# Patient Record
Sex: Male | Born: 1935
Health system: Southern US, Community
[De-identification: ages and names within clinical notes are randomized; demographics above are authoritative.]

## PROBLEM LIST (undated history)

## (undated) DIAGNOSIS — R001 Bradycardia, unspecified: Secondary | ICD-10-CM

## (undated) DIAGNOSIS — N400 Enlarged prostate without lower urinary tract symptoms: Secondary | ICD-10-CM

## (undated) DIAGNOSIS — I471 Supraventricular tachycardia, unspecified: Secondary | ICD-10-CM

## (undated) DIAGNOSIS — T7840XA Allergy, unspecified, initial encounter: Secondary | ICD-10-CM

## (undated) DIAGNOSIS — I10 Essential (primary) hypertension: Secondary | ICD-10-CM

## (undated) DIAGNOSIS — H3530 Unspecified macular degeneration: Secondary | ICD-10-CM

## (undated) DIAGNOSIS — R55 Syncope and collapse: Secondary | ICD-10-CM

## (undated) DIAGNOSIS — M170 Bilateral primary osteoarthritis of knee: Secondary | ICD-10-CM

## (undated) DIAGNOSIS — E785 Hyperlipidemia, unspecified: Secondary | ICD-10-CM

## (undated) HISTORY — PX: OTHER SURGICAL HISTORY: SHX169

## (undated) HISTORY — DX: Hyperlipidemia, unspecified: E78.5

## (undated) HISTORY — DX: Unspecified macular degeneration: H35.30

## (undated) HISTORY — DX: Bilateral primary osteoarthritis of knee: M17.0

## (undated) HISTORY — DX: Allergy, unspecified, initial encounter: T78.40XA

## (undated) HISTORY — DX: Benign prostatic hyperplasia without lower urinary tract symptoms: N40.0

---

## 1992-05-18 HISTORY — PX: LUMBAR FUSION: SHX111

## 2004-07-16 ENCOUNTER — Encounter: Admission: RE | Admit: 2004-07-16 | Discharge: 2004-07-16 | Payer: Self-pay | Admitting: Orthopedic Surgery

## 2004-08-25 ENCOUNTER — Encounter: Admission: RE | Admit: 2004-08-25 | Discharge: 2004-08-25 | Payer: Self-pay | Admitting: *Deleted

## 2008-06-05 ENCOUNTER — Encounter: Admission: RE | Admit: 2008-06-05 | Discharge: 2008-06-05 | Payer: Self-pay

## 2008-06-18 DIAGNOSIS — N400 Enlarged prostate without lower urinary tract symptoms: Secondary | ICD-10-CM

## 2008-06-18 HISTORY — DX: Benign prostatic hyperplasia without lower urinary tract symptoms: N40.0

## 2008-08-27 ENCOUNTER — Encounter: Admission: RE | Admit: 2008-08-27 | Discharge: 2008-08-27 | Payer: Self-pay | Admitting: Sports Medicine

## 2008-09-14 ENCOUNTER — Emergency Department (HOSPITAL_BASED_OUTPATIENT_CLINIC_OR_DEPARTMENT_OTHER): Admission: EM | Admit: 2008-09-14 | Discharge: 2008-09-14 | Payer: Self-pay | Admitting: Emergency Medicine

## 2008-09-27 ENCOUNTER — Emergency Department (HOSPITAL_BASED_OUTPATIENT_CLINIC_OR_DEPARTMENT_OTHER): Admission: EM | Admit: 2008-09-27 | Discharge: 2008-09-27 | Payer: Self-pay | Admitting: Emergency Medicine

## 2009-05-18 HISTORY — PX: CATARACT EXTRACTION W/ INTRAOCULAR LENS  IMPLANT, BILATERAL: SHX1307

## 2009-06-14 ENCOUNTER — Encounter: Admission: RE | Admit: 2009-06-14 | Discharge: 2009-06-14 | Payer: Self-pay | Admitting: Internal Medicine

## 2010-08-27 LAB — BASIC METABOLIC PANEL
Calcium: 9.1 mg/dL (ref 8.4–10.5)
GFR calc Af Amer: >60 mL/min (ref >60)
Potassium: 4.1 mEq/L (ref 3.5–5.1)

## 2010-08-27 LAB — CBC
HCT: 46 % (ref 39.0–52.0)
Platelets: 210 10*3/uL (ref 150–400)
RDW: 12.8 % (ref 11.5–15.5)
WBC: 8.6 10*3/uL (ref 4.0–10.5)

## 2010-08-27 LAB — PROTIME-INR: Prothrombin Time: 13.2 seconds (ref 11.6–15.2)

## 2010-08-27 LAB — DIFFERENTIAL
Lymphocytes Relative: 10 % — ABNORMAL LOW (ref 12–46)
Monocytes Absolute: 0.4 10*3/uL (ref 0.1–1.0)

## 2010-08-27 LAB — APTT: aPTT: 28 seconds (ref 24–37)

## 2010-10-30 ENCOUNTER — Other Ambulatory Visit: Payer: Self-pay | Admitting: Orthopedic Surgery

## 2010-10-30 DIAGNOSIS — M25512 Pain in left shoulder: Secondary | ICD-10-CM

## 2010-11-02 ENCOUNTER — Other Ambulatory Visit: Payer: Self-pay

## 2010-11-07 ENCOUNTER — Ambulatory Visit
Admission: RE | Admit: 2010-11-07 | Discharge: 2010-11-07 | Disposition: A | Discharge status: Home / Self Care | Payer: Medicare Other | Source: Ambulatory Visit | Attending: Orthopedic Surgery | Admitting: Orthopedic Surgery

## 2010-11-07 DIAGNOSIS — M25512 Pain in left shoulder: Secondary | ICD-10-CM

## 2011-05-19 HISTORY — PX: COLONOSCOPY: SHX174

## 2011-05-21 DIAGNOSIS — M1990 Unspecified osteoarthritis, unspecified site: Secondary | ICD-10-CM | POA: Insufficient documentation

## 2011-05-21 DIAGNOSIS — N401 Enlarged prostate with lower urinary tract symptoms: Secondary | ICD-10-CM | POA: Insufficient documentation

## 2011-10-19 DIAGNOSIS — J449 Chronic obstructive pulmonary disease, unspecified: Secondary | ICD-10-CM | POA: Insufficient documentation

## 2011-10-20 ENCOUNTER — Encounter: Payer: Self-pay | Admitting: Internal Medicine

## 2011-11-05 ENCOUNTER — Encounter: Payer: Self-pay | Admitting: Internal Medicine

## 2011-11-05 ENCOUNTER — Ambulatory Visit (AMBULATORY_SURGERY_CENTER): Payer: Medicare Other | Admitting: *Deleted

## 2011-11-05 VITALS — Ht 71.0 in | Wt 158.0 lb

## 2011-11-05 DIAGNOSIS — Z1211 Encounter for screening for malignant neoplasm of colon: Secondary | ICD-10-CM

## 2011-11-05 MED ORDER — MOVIPREP 100 G PO SOLR: ORAL | Status: DC

## 2011-12-02 ENCOUNTER — Encounter: Payer: Self-pay | Admitting: Internal Medicine

## 2011-12-02 ENCOUNTER — Ambulatory Visit (AMBULATORY_SURGERY_CENTER): Payer: Medicare Other | Admitting: Internal Medicine

## 2011-12-02 VITALS — BP 159/94 | HR 61 | Temp 96.9°F | Resp 15 | Ht 71.0 in | Wt 158.0 lb

## 2011-12-02 DIAGNOSIS — K5730 Diverticulosis of large intestine without perforation or abscess without bleeding: Secondary | ICD-10-CM

## 2011-12-02 DIAGNOSIS — Z1211 Encounter for screening for malignant neoplasm of colon: Secondary | ICD-10-CM

## 2011-12-02 MED ORDER — SODIUM CHLORIDE 0.9 % IV SOLN: 500.0000 mL | INTRAVENOUS | Status: DC

## 2011-12-02 NOTE — Op Note (Signed)
[facility] [address] [place], [place]  [address]  COLONOSCOPY PROCEDURE REPORT  PATIENT:  Kevin Page, Kevin Page  MR#:  [id number] BIRTHDATE:  11-06-1930, 80 yrs. old  GENDER:  male ENDOSCOPIST:  Iva Boop, MD, [facility] REF. BY:  Kari Baars, M.D. PROCEDURE DATE:  12/02/2011 PROCEDURE:  Colonoscopy 62130 ASA CLASS:  Class III INDICATIONS:  Routine Risk Screening MEDICATIONS:   These medications were titrated to patient response per physician's verbal order, MAC sedation, administered by CRNA, propofol (Diprivan) 140 mg  DESCRIPTION OF PROCEDURE:   After the risks benefits and alternatives of the procedure were thoroughly explained, informed consent was obtained.  Digital rectal exam was performed and revealed no rectal masses and an enlarged prostate.  Smooth, enlarged prostate with tiny calcified nodule on right. The LB CF-[id number] endoscope was introduced through the anus and advanced to the cecum, which was identified by both the appendix and ileocecal valve, without limitations.  The quality of the prep was excellent, using MoviPrep.  The instrument was then slowly withdrawn as the colon was fully examined. <<PROCEDUREIMAGES>>  FINDINGS:  Severe diverticulosis was found in the sigmoid colon. This was otherwise a normal examination of the colon. Includes right colon retroflexion.   Retroflexed views in the rectum revealed no abnormalities.    The time to cecum = 3:00 minutes. The scope was then withdrawn in 9:49  minutes from the cecum and the procedure completed. COMPLICATIONS:  None ENDOSCOPIC IMPRESSION: 1) Severe diverticulosis in the sigmoid colon 2) Otherwise normal colonoscopy  REPEAT EXAM:  In for as needed.  Iva Boop, MD, [facility]  CC:  Kari Baars, MD and The Patient  n. eSIGNED:   Iva Boop at 12/02/2011 10:33 AM  Haynes Hoehn, [phone]

## 2011-12-02 NOTE — Progress Notes (Signed)
Propofol per k rogers crna. See scanned intra procedure report. ewm 

## 2011-12-02 NOTE — Patient Instructions (Addendum)
There were no polyps or cancer seen!  You do have diverticulosis, a common condition that usually does not cause problems. Please read the information about this.  Thank you for choosing me and [facility].  Iva Boop, MD, FACG  YOU HAD AN ENDOSCOPIC PROCEDURE TODAY AT THE [facility]: Refer to the procedure report that was given to you for any specific questions about what was found during the examination.  If the procedure report does not answer your questions, please call your gastroenterologist to clarify.  If you requested that your care partner not be given the details of your procedure findings, then the procedure report has been included in a sealed envelope for you to review at your convenience later.  YOU SHOULD EXPECT: Some feelings of bloating in the abdomen. Passage of more gas than usual.  Walking can help get rid of the air that was put into your GI tract during the procedure and reduce the bloating. If you had a lower endoscopy (such as a colonoscopy or flexible sigmoidoscopy) you may notice spotting of blood in your stool or on the toilet paper. If you underwent a bowel prep for your procedure, then you may not have a normal bowel movement for a few days.  DIET: Your first meal following the procedure should be a light meal and then it is ok to progress to your normal diet.  A half-sandwich or bowl of soup is an example of a good first meal.  Heavy or fried foods are harder to digest and may make you feel nauseous or bloated.  Likewise meals heavy in dairy and vegetables can cause extra gas to form and this can also increase the bloating.  Drink plenty of fluids but you should avoid alcoholic beverages for 24 hours.  ACTIVITY: Your care partner should take you home directly after the procedure.  You should plan to take it easy, moving slowly for the rest of the day.  You can resume normal activity the day after the procedure however you should NOT DRIVE  or use heavy machinery for 24 hours (because of the sedation medicines used during the test).    SYMPTOMS TO REPORT IMMEDIATELY: A gastroenterologist can be reached at any hour.  During normal business hours, 8:30 AM to 5:00 PM Monday through Friday, call [phone].  After hours and on weekends, please call the GI answering service at [phone] who will take a message and have the physician on call contact you.   Following lower endoscopy (colonoscopy or flexible sigmoidoscopy):  Excessive amounts of blood in the stool  Significant tenderness or worsening of abdominal pains  Swelling of the abdomen that is new, acute  Fever of 100F or higher  FOLLOW UP:.  Our staff will call the home number listed on your records the next business day following your procedure to check on you and address any questions or concerns that you may have at that time regarding the information given to you following your procedure. This is a courtesy call and so if there is no answer at the home number and we have not heard from you through the emergency physician on call, we will assume that you have returned to your regular daily activities without incident.  SIGNATURES/CONFIDENTIALITY: You and/or your care partner have signed paperwork which will be entered into your electronic medical record.  These signatures attest to the fact that that the information above on your After Visit Summary has been reviewed and is  understood.  Full responsibility of the confidentiality of this discharge information lies with you and/or your care-partner.   Ok to resume your normal medications Follow up colonoscopy as needed

## 2011-12-02 NOTE — Progress Notes (Signed)
Patient did not experience any of the following events: a burn prior to discharge; a fall within the facility; wrong site/side/patient/procedure/implant event; or a hospital transfer or hospital admission upon discharge from the facility. (G8907) Patient did not have preoperative order for IV antibiotic SSI prophylaxis. (G8918)  

## 2011-12-03 ENCOUNTER — Telehealth: Payer: Self-pay

## 2011-12-03 NOTE — Telephone Encounter (Signed)
  Follow up Call-  Call back number 12/02/2011  Post procedure Call Back phone  # [phone]  Permission to leave phone message Yes     Patient questions:  Do you have a fever, pain , or abdominal swelling? no Pain Score  0 *  Have you tolerated food without any problems? yes  Have you been able to return to your normal activities? yes  Do you have any questions about your discharge instructions: Diet   no Medications  no Follow up visit  no  Do you have questions or concerns about your Care? no  Actions: * If pain score is 4 or above: No action needed, pain <4.

## 2012-01-13 DIAGNOSIS — Z8673 Personal history of transient ischemic attack (TIA), and cerebral infarction without residual deficits: Secondary | ICD-10-CM | POA: Insufficient documentation

## 2013-01-11 ENCOUNTER — Observation Stay (HOSPITAL_COMMUNITY)
Admission: EM | Admit: 2013-01-11 | Discharge: 2013-01-12 | Disposition: A | Discharge status: Home / Self Care | Payer: Medicare Other | Attending: Internal Medicine | Admitting: Internal Medicine

## 2013-01-11 ENCOUNTER — Emergency Department (HOSPITAL_COMMUNITY): Payer: Medicare Other

## 2013-01-11 ENCOUNTER — Encounter (HOSPITAL_COMMUNITY): Payer: Self-pay

## 2013-01-11 DIAGNOSIS — E785 Hyperlipidemia, unspecified: Secondary | ICD-10-CM | POA: Insufficient documentation

## 2013-01-11 DIAGNOSIS — I10 Essential (primary) hypertension: Secondary | ICD-10-CM | POA: Insufficient documentation

## 2013-01-11 DIAGNOSIS — I080 Rheumatic disorders of both mitral and aortic valves: Secondary | ICD-10-CM | POA: Insufficient documentation

## 2013-01-11 DIAGNOSIS — Z9181 History of falling: Secondary | ICD-10-CM | POA: Insufficient documentation

## 2013-01-11 DIAGNOSIS — J4489 Other specified chronic obstructive pulmonary disease: Secondary | ICD-10-CM | POA: Insufficient documentation

## 2013-01-11 DIAGNOSIS — N400 Enlarged prostate without lower urinary tract symptoms: Secondary | ICD-10-CM | POA: Insufficient documentation

## 2013-01-11 DIAGNOSIS — I079 Rheumatic tricuspid valve disease, unspecified: Secondary | ICD-10-CM | POA: Insufficient documentation

## 2013-01-11 DIAGNOSIS — R404 Transient alteration of awareness: Secondary | ICD-10-CM | POA: Insufficient documentation

## 2013-01-11 DIAGNOSIS — J449 Chronic obstructive pulmonary disease, unspecified: Secondary | ICD-10-CM | POA: Insufficient documentation

## 2013-01-11 DIAGNOSIS — R42 Dizziness and giddiness: Secondary | ICD-10-CM | POA: Insufficient documentation

## 2013-01-11 DIAGNOSIS — R55 Syncope and collapse: Principal | ICD-10-CM | POA: Insufficient documentation

## 2013-01-11 LAB — CBC WITH DIFFERENTIAL/PLATELET
Basophils Absolute: 0 10*3/uL (ref 0.0–0.1)
Basophils Relative: 0 % (ref 0–1)
Eosinophils Relative: 1 % (ref 0–5)
Lymphocytes Relative: 7 % — ABNORMAL LOW (ref 12–46)
MCHC: 34.4 g/dL (ref 30.0–36.0)
Neutro Abs: 11.1 10*3/uL — ABNORMAL HIGH (ref 1.7–7.7)
Platelets: 214 10*3/uL (ref 150–400)
RDW: 13.5 % (ref 11.5–15.5)
WBC: 12.5 10*3/uL — ABNORMAL HIGH (ref 4.0–10.5)

## 2013-01-11 LAB — URINALYSIS, ROUTINE W REFLEX MICROSCOPIC
Bilirubin Urine: NEGATIVE
Nitrite: NEGATIVE
Protein, ur: NEGATIVE mg/dL
Specific Gravity, Urine: 1.03 (ref 1.005–1.030)
Urobilinogen, UA: 0.2 mg/dL (ref 0.0–1.0)

## 2013-01-11 LAB — POCT I-STAT, CHEM 8
Glucose, Bld: 123 mg/dL — ABNORMAL HIGH (ref 70–99)
HCT: 41 % (ref 39.0–52.0)
Hemoglobin: 13.9 g/dL (ref 13.0–17.0)
Potassium: 4.4 mEq/L (ref 3.5–5.1)
TCO2: 27 mmol/L (ref 0–100)

## 2013-01-11 LAB — COMPREHENSIVE METABOLIC PANEL
Alkaline Phosphatase: 49 U/L (ref 39–117)
BUN: 15 mg/dL (ref 6–23)
CO2: 27 mEq/L (ref 19–32)
Chloride: 101 mEq/L (ref 96–112)
GFR calc Af Amer: 79 mL/min — ABNORMAL LOW (ref >90)
GFR calc non Af Amer: 69 mL/min — ABNORMAL LOW (ref >90)
Glucose, Bld: 123 mg/dL — ABNORMAL HIGH (ref 70–99)
Potassium: 4.3 mEq/L (ref 3.5–5.1)
Total Bilirubin: 0.4 mg/dL (ref 0.3–1.2)

## 2013-01-11 LAB — TROPONIN I: Troponin I: <0.3 ng/mL (ref <0.30)

## 2013-01-11 NOTE — ED Provider Notes (Signed)
CSN: [id number]     Arrival date & time 01/11/13  2040 History   First MD Initiated Contact with Patient 01/11/13 2056     Chief Complaint  Patient presents with  . Syncope    (Consider location/radiation/quality/duration/timing/severity/associated sxs/prior Treatment) HPI 79 year old male with a history of hypertension, hyperlipidemia presents via EMS for syncope. Patient reports he was in his normal state of health when today he went to an event [place] the [place] and did a lot of walking. Reports he had approximately 1-1/2 beers and little to eat, and was walking and began to feel lightheaded and sat down in a chair.  Reports he lost consciousness briefly but was aware when he hit the ground.  Witnesses deny seizure-like activity or prolonged loss of consciousness. Reports that patient appeared diaphoretic and pale after this happened and patient reported he continued to feel lightheaded after he awoke on the floor. EMS was called and noted patient to be hypotensive to 91/59 on arrival. His glucose was within normal limits.  Patient denies any preceding or current chest pain or shortness of breath. Patient denies any prior history of syncope, and notes that he had one episode of lightheadedness in the past. Denies any cardiac history including no history of MI, arrhythmia, and reports he had a normal stress test 67yrs ago but no cardiac evaluation since.  Notes hyperlipidemia, htn, remote hx smoking.  Reports he was in his normal state of health and denies fevers, cough, dysuria, abdominal pain, nausea/vomiting, diarrhea and blood in his stool.    Past Medical History  Diagnosis Date  . Allergy     dust and mites  . Hyperlipidemia   . BPH (benign prostatic hyperplasia)    Past Surgical History  Procedure Laterality Date  . Cataract extraction w/ intraocular lens  implant, bilateral  2011  . Lumbar fusion  1994  . Dupytron contractions  1998 , 2006    both hands   No family history  on file. History  Substance Use Topics  . Smoking status: Former Games developer  . Smokeless tobacco: Never Used  . Alcohol Use: 6.0 oz/week    12 drink(s) per week    Review of Systems  Constitutional: Negative for fever and chills.  HENT: Positive for neck pain (right side). Negative for sore throat and neck stiffness.   Eyes: Negative for visual disturbance.  Respiratory: Negative for shortness of breath.   Cardiovascular: Negative for chest pain.  Gastrointestinal: Negative for nausea, vomiting, abdominal pain, diarrhea (had 3BM today which is increased, however normal BMs), constipation and blood in stool.  Genitourinary: Negative for dysuria and difficulty urinating.  Musculoskeletal: Negative for back pain.  Skin: Negative for rash.  Neurological: Positive for syncope and light-headedness. Negative for headaches.    Allergies  Review of patient's allergies indicates no known allergies.  Home Medications   Current Outpatient Rx  Name  Route  Sig  Dispense  Refill  . aspirin EC 81 MG tablet   Oral   Take 81 mg by mouth daily.         . Azelastine-Fluticasone 137-50 MCG/ACT SUSP   Nasal   Place 1 spray into the nose as needed (congestion).          [name] dutasteride (AVODART) 0.5 MG capsule   Oral   Take 0.5 mg by mouth daily.         [name] lisinopril (PRINIVIL,ZESTRIL) 10 MG tablet   Oral   Take 10 mg by mouth daily.         [name]  Multiple Vitamins-Minerals (MH MACULAR HEALTH PO)   Oral   Take 2 tablets by mouth daily.         . simvastatin (ZOCOR) 20 MG tablet   Oral   Take 1 tablet by mouth Daily.         . Tamsulosin HCl (FLOMAX) 0.4 MG CAPS   Oral   Take 1 tablet by mouth Daily.          BP 148/86  Pulse 76  Temp(Src) 98.4 F (36.9 C) (Oral)  Resp 18  SpO2 97% Physical Exam  Nursing note and vitals reviewed. Constitutional: He is oriented to person, place, and time. He appears well-developed and well-nourished. No distress.  HENT:  Head:  Normocephalic and atraumatic.  Mouth/Throat: Oropharynx is clear and moist. No oropharyngeal exudate.  Eyes: Conjunctivae and EOM are normal. Pupils are equal, round, and reactive to light.  Neck: Normal range of motion.  Cardiovascular: Normal rate, regular rhythm, normal heart sounds and intact distal pulses.  Exam reveals no gallop and no friction rub.   No murmur heard. Pulmonary/Chest: Effort normal and breath sounds normal. No respiratory distress. He has no wheezes. He has no rales.  Abdominal: Soft. He exhibits no distension. There is no tenderness. There is no guarding.  Musculoskeletal: He exhibits no edema.  Neurological: He is alert and oriented to person, place, and time. He has normal strength. No cranial nerve deficit or sensory deficit. He exhibits normal muscle tone. He displays a negative Romberg sign. Coordination normal. GCS eye subscore is 4. GCS verbal subscore is 5. GCS motor subscore is 6.  Skin: Skin is warm and dry. He is not diaphoretic.    ED Course  Procedures (including critical care time) Labs Review Labs Reviewed  GLUCOSE, CAPILLARY - Abnormal; Notable for the following:    Glucose-Capillary 108 (*)    All other components within normal limits  COMPREHENSIVE METABOLIC PANEL - Abnormal; Notable for the following:    Glucose, Bld 123 (*)    GFR calc non Af Amer 69 (*)    GFR calc Af Amer 79 (*)    All other components within normal limits  CBC WITH DIFFERENTIAL - Abnormal; Notable for the following:    WBC 12.5 (*)    RBC 4.12 (*)    HCT 38.4 (*)    Neutrophils Relative % 89 (*)    Neutro Abs 11.1 (*)    Lymphocytes Relative 7 (*)    All other components within normal limits  URINALYSIS, ROUTINE W REFLEX MICROSCOPIC - Abnormal; Notable for the following:    APPearance CLOUDY (*)    All other components within normal limits  POCT I-STAT, CHEM 8 - Abnormal; Notable for the following:    Glucose, Bld 123 (*)    All other components within normal limits   TROPONIN I   Imaging Review Dg Chest 2 View  01/11/2013   *RADIOLOGY REPORT*  Clinical Data: Syncope  CHEST - 2 VIEW  Comparison: 06/14/2009  Findings: The cardiac shadow is stable.  Persistent elevation left hemidiaphragm is seen.  The lungs remain clear.  No acute bony abnormality is noted.  IMPRESSION: No acute abnormalities seen.   Original Report Authenticated By: Alcide Clever, M.D.   Date: 01/12/2013  Rate: 62  Rhythm: normal sinus rhythm  QRS Axis: normal  Intervals: normal  ST/T Wave abnormalities: none  Conduction Disutrbances:none  Narrative Interpretation:   Old EKG Reviewed: none available    MDM   1. Syncope  79 year old male with a history of hypertension, hyperlipidemia presents via EMS for syncope.  Differential diagnosis includes seizure, cardiac arrhythmia, cardiac ischemia, PE, infection, electrolyte abnormality. Glucose within normal limits. EKG evaluated by me and shows no sign of prolonged QT, Brugada, delta waves or ischemia.  Patient without chest pain or shortness of breath and he indicate cardiac ischemia or PE. Initial troponin negative. CBC shows nonspecific leukocytosis of 12.5. No sign of heart failure or pneumonia on chest x-ray. Urinalysis shows no signs of infection. Electrolytes are within normal limits and do not explain patient's syncope.  Patient denies any black or bloody bowel movements, or history of nausea vomiting or diarrhea, and have low suspicion for hypovolemia or acute blood loss as cause of syncope, however patient does report drinking alcohol, eating little, and exerting self more than usual prior to episode.   Patient with preceding lightheadedness, and vasovagal syncope remains possibility, however given patient's age, initial hypotension recommend admission. Discussed patient with Dr. Clelia Croft PCP will, see the patient for potential admission for observation.     Rhae Lerner, MD 01/12/13 [phone]

## 2013-01-11 NOTE — ED Notes (Signed)
Patient was at a meeting sitting in chair and had a witnessed syncopal event. Patient slid out of chair to the floor and was out for "about a minute." patient pale and diaphoretic at EMS arrival. BP 91/59. Received NS 125 mL. Last BP 108/61. CBG 125. Color has improved. Skin warm and dry. Neuo intact. No neurological deficits or weakness. Patient admits to consuming 2 beers today. Only c/o some neck stiffness. Hx TIA 6 months ago.

## 2013-01-11 NOTE — ED Notes (Signed)
Patient reports that he "over exerted himself today" did a lot of walking today. States that he felt lightheaded prior to the witnessed syncopal event. Denies any CP, SOB, palpitations or N/V. Patient unsure if he hit his head or not. Only complains of some neck stiffness. Currently patient is AAOx4, resp e/u, skin w/d, NAD noted.

## 2013-01-11 NOTE — ED Notes (Signed)
[facility] [phone] Windy Kalata D

## 2013-01-11 NOTE — ED Notes (Signed)
Patient transported to X-ray 

## 2013-01-12 ENCOUNTER — Encounter (HOSPITAL_COMMUNITY): Payer: Self-pay | Admitting: Internal Medicine

## 2013-01-12 DIAGNOSIS — N400 Enlarged prostate without lower urinary tract symptoms: Secondary | ICD-10-CM | POA: Diagnosis present

## 2013-01-12 DIAGNOSIS — R55 Syncope and collapse: Secondary | ICD-10-CM | POA: Diagnosis present

## 2013-01-12 DIAGNOSIS — E785 Hyperlipidemia, unspecified: Secondary | ICD-10-CM | POA: Diagnosis present

## 2013-01-12 DIAGNOSIS — I10 Essential (primary) hypertension: Secondary | ICD-10-CM | POA: Diagnosis present

## 2013-01-12 LAB — CBC
MCH: 32.2 pg (ref 26.0–34.0)
MCHC: 34.7 g/dL (ref 30.0–36.0)
Platelets: 207 10*3/uL (ref 150–400)
RBC: 3.91 MIL/uL — ABNORMAL LOW (ref 4.22–5.81)
RDW: 13.5 % (ref 11.5–15.5)

## 2013-01-12 LAB — BASIC METABOLIC PANEL
CO2: 27 mEq/L (ref 19–32)
Calcium: 8.9 mg/dL (ref 8.4–10.5)
GFR calc Af Amer: 88 mL/min — ABNORMAL LOW (ref >90)
GFR calc non Af Amer: 76 mL/min — ABNORMAL LOW (ref >90)
Sodium: 135 mEq/L (ref 135–145)

## 2013-01-12 LAB — TROPONIN I
Troponin I: <0.3 ng/mL (ref <0.30)
Troponin I: <0.3 ng/mL (ref <0.30)

## 2013-01-12 MED ORDER — HYDROCODONE-ACETAMINOPHEN 5-325 MG PO TABS: 1.0000 | ORAL_TABLET | ORAL | Status: DC | PRN

## 2013-01-12 MED ORDER — ONDANSETRON HCL 4 MG PO TABS: 4.0000 mg | ORAL_TABLET | Freq: Four times a day (QID) | ORAL | Status: DC | PRN

## 2013-01-12 MED ORDER — ACETAMINOPHEN 650 MG RE SUPP: 650.0000 mg | Freq: Four times a day (QID) | RECTAL | Status: DC | PRN

## 2013-01-12 MED ORDER — SIMVASTATIN 20 MG PO TABS
20.0000 mg | ORAL_TABLET | Freq: Every day | ORAL | Status: DC
  Filled 2013-01-12: qty 1

## 2013-01-12 MED ORDER — FLUTICASONE PROPIONATE 50 MCG/ACT NA SUSP: 1.0000 | Freq: Two times a day (BID) | NASAL | Status: DC | PRN

## 2013-01-12 MED ORDER — TAMSULOSIN HCL 0.4 MG PO CAPS: 0.4000 mg | ORAL_CAPSULE | Freq: Every day | ORAL | Status: DC

## 2013-01-12 MED ORDER — AZELASTINE HCL 0.1 % NA SOLN: 1.0000 | Freq: Two times a day (BID) | NASAL | Status: DC | PRN

## 2013-01-12 MED ORDER — DUTASTERIDE 0.5 MG PO CAPS
0.5000 mg | ORAL_CAPSULE | Freq: Every day | ORAL | Status: DC
  Administered 2013-01-12: 0.5 mg via ORAL
  Filled 2013-01-12: qty 1

## 2013-01-12 MED ORDER — LISINOPRIL 10 MG PO TABS
10.0000 mg | ORAL_TABLET | Freq: Every day | ORAL | Status: DC
  Administered 2013-01-12: 10 mg via ORAL
  Filled 2013-01-12: qty 1

## 2013-01-12 MED ORDER — AZELASTINE-FLUTICASONE 137-50 MCG/ACT NA SUSP: 1.0000 | NASAL | Status: DC | PRN

## 2013-01-12 MED ORDER — SODIUM CHLORIDE 0.9 % IV SOLN
INTRAVENOUS | Status: DC
  Administered 2013-01-12: 02:00:00 via INTRAVENOUS

## 2013-01-12 MED ORDER — ALUM & MAG HYDROXIDE-SIMETH 200-200-20 MG/5ML PO SUSP: 30.0000 mL | Freq: Four times a day (QID) | ORAL | Status: DC | PRN

## 2013-01-12 MED ORDER — ACETAMINOPHEN 325 MG PO TABS: 650.0000 mg | ORAL_TABLET | Freq: Four times a day (QID) | ORAL | Status: DC | PRN

## 2013-01-12 MED ORDER — ASPIRIN EC 81 MG PO TBEC
81.0000 mg | DELAYED_RELEASE_TABLET | Freq: Every day | ORAL | Status: DC
  Administered 2013-01-12: 81 mg via ORAL
  Filled 2013-01-12: qty 1

## 2013-01-12 MED ORDER — SODIUM CHLORIDE 0.9 % IJ SOLN: 3.0000 mL | Freq: Two times a day (BID) | INTRAMUSCULAR | Status: DC

## 2013-01-12 MED ORDER — ONDANSETRON HCL 4 MG/2ML IJ SOLN: 4.0000 mg | Freq: Four times a day (QID) | INTRAMUSCULAR | Status: DC | PRN

## 2013-01-12 MED ORDER — ENOXAPARIN SODIUM 40 MG/0.4ML ~~LOC~~ SOLN
40.0000 mg | Freq: Every day | SUBCUTANEOUS | Status: DC
  Filled 2013-01-12: qty 0.4

## 2013-01-12 NOTE — Progress Notes (Signed)
Assessment unchanged. Discussed D/C instructions with pt and wife. No new changes in medications. IV and tele removed. Pt left via W/C accompanied by [employer].

## 2013-01-12 NOTE — Progress Notes (Signed)
Utilization Review Completed.   Vada Swift, RN, BSN Nurse Case Manager  [phone]  

## 2013-01-12 NOTE — Discharge Summary (Signed)
DISCHARGE SUMMARY  Kevin Page  MR#: [id number]  DOB:01/09/1931  Date of Admission: 01/11/2013 Date of Discharge: 01/12/2013  Attending Physician:SHAW,W DOUGLAS  Patient's NWG:NFAO,Kevin Page  Consults:  None  Discharge Diagnoses: Principal Problem:   Syncope Active Problems:   Hypertension   Hyperlipidemia   BPH (benign prostatic hyperplasia)  Past Medical History HTN Hyperlipidemia  BPH with elevated PSA- negative biopsy (2/10)  COPD- seen on CT 4/10  Allergic Rhinitis/chronic congestion  OA, knees-  Macular Degeneration- Dr. Hazle Quant  ?TIA/episode of confusion- 8/13- negative MRA, carotids  Past Surgical History  Procedure Laterality Date  . Cataract extraction w/ intraocular lens  implant, bilateral  2011  . Lumbar fusion  1994  . Dupytron contractions  1998 , 2006    both hands    Discharge Medications:   Medication List         aspirin EC 81 MG tablet  Take 81 mg by mouth daily.     Azelastine-Fluticasone 137-50 MCG/ACT Susp  Place 1 spray into the nose as needed (congestion).     dutasteride 0.5 MG capsule  Commonly known as:  AVODART  Take 0.5 mg by mouth daily.     lisinopril 10 MG tablet  Commonly known as:  PRINIVIL,ZESTRIL  Take 10 mg by mouth daily.     MH MACULAR HEALTH PO  Take 2 tablets by mouth daily.     simvastatin 20 MG tablet  Commonly known as:  ZOCOR  Take 1 tablet by mouth Daily.     tamsulosin 0.4 MG Caps capsule  Commonly known as:  FLOMAX  Take 1 tablet by mouth Daily.        Hospital Procedures: Dg Chest 2 View  01/11/2013   *RADIOLOGY REPORT*  Clinical Data: Syncope  CHEST - 2 VIEW  Comparison: 06/14/2009  Findings: The cardiac shadow is stable.  Persistent elevation left hemidiaphragm is seen.  The lungs remain clear.  No acute bony abnormality is noted.  IMPRESSION: No acute abnormalities seen.   Original Report Authenticated By: Alcide Clever, M.D.   Echocardiogram (8/28)- Study Conclusions - Left ventricle:  The cavity size was normal. Systolic function was normal. The estimated ejection fraction was in the range of 55% to 60%. Wall motion was normal; there were no regional wall motion abnormalities. Doppler parameters are consistent with abnormal left ventricular relaxation (grade 1 diastolic dysfunction). - Aortic valve: Mild regurgitation. - Aortic root: The aortic root was mildly dilated. - Mitral valve: Mild regurgitation. - Left atrium: The atrium was mildly to moderately dilated.   History of Present Illness: Kevin Page is a 79 year old white male with a history of hypertension, BPH, and possible TIA (8/13) who presented to the emergency department following a syncopal episode. Patient states that he had a long day today which began at the eye doctor for dilated eye exam. He had a milk shake for lunch. This evening he went to a gathering at the [employer] [place] where he walked for about one hour. He did not eat anything at this gathering but did drink about one beer. Near the end of the walk he became lightheaded and sat down. He says only had a brief loss of consciousness which he fell out of the chair. This was witnessed with no seizure activity. Also consciousness was less than 1 minute. EMS was activated and he was found have a blood pressure of 91/59, CBG 125. He was given normal saline 125 mL with a follow blood pressure of 108/61. In the  emergency department his EKG shows sinus arrhythmia with no acute ST changes. Cardiac enzymes are negative. Telemetry does show some occasional PACs and PVCs.   Currently, the patient is without complaint. He denies any recent chest pain or palpitations. No prior history of syncope. He did have a possible TIA exactly one year ago which was characterized by some mild confusion and unsteady gait. This may been alcohol related. He had an MRI/MRA and carotid Dopplers performed at that time that were normal. He has not had any recurrence of symptoms since  that time. His blood pressures at home have been consistently below 130/80 but no hypotension. He reports occasional lightheadedness when standing from a stooped over position.   Hospital Course: Kevin Page was observed overnight on telemetry. He had no arrhythmias noted other than sinus arrhythmia. Orthostatics were repeated and were negative. Cardiac enzymes remain negative. Echocardiogram was performed which was normal with no valvular or wall motion abnormalities.  He had carotid Dopplers done 1 year ago that showed no significant stenosis so these were not repeated. He had no further symptoms of lightheadedness, dizziness, or near syncope. His history and findings are most consistent with vasovagal syncope versus relative hypotension in the setting of poor by mouth intake and flomax/Lisinopril. I've encouraged him to push fluids, maintain good by mouth intake, and to call if he has any recurrent dizziness, lightheadedness, or near syncope. He is stable for discharge home with outpatient followup. We will consider an outpatient Holter monitor if he has any additional symptoms.  Day of Discharge Exam BP 152/94  Pulse 87  Temp(Src) 98.1 F (36.7 C) (Oral)  Resp 18  Ht 5\' 11"  (1.803 m)  Wt 71.4 kg (157 lb 6.5 oz)  BMI 21.96 kg/m2  SpO2 100%  Physical Exam: General appearance: alert and no distress Eyes: no scleral icterus Throat: oropharynx moist without erythema Resp: clear to auscultation bilaterally Cardio: regular rate and rhythm and with occasional ectopy (sinus rhythm with sinus arrythmia/PACs on telemetry) GI: soft, non-tender; bowel sounds normal; no masses,  no organomegaly Extremities: no clubbing, cyanosis or edema  Discharge Labs:  Recent Labs  01/11/13 2200 01/11/13 2219 01/12/13 0415  NA 137 138 135  K 4.3 4.4 3.6  CL 101 101 100  CO2 27  --  27  GLUCOSE 123* 123* 102*  BUN 15 14 14   CREATININE 1.01 1.10 0.98  CALCIUM 9.2  --  8.9    Recent Labs   01/11/13 2200  AST 24  ALT 16  ALKPHOS 49  BILITOT 0.4  PROT 6.1  ALBUMIN 3.7    Recent Labs  01/11/13 2200 01/11/13 2219 01/12/13 0415  WBC 12.5*  --  7.6  NEUTROABS 11.1*  --   --   HGB 13.2 13.9 12.6*  HCT 38.4* 41.0 36.3*  MCV 93.2  --  92.8  PLT 214  --  207   Lab Results  Component Value Date   INR 1.0 09/14/2008    Recent Labs  01/11/13 2150 01/12/13 0415  TROPONINI <0.30 <0.30    Discharge instructions:     Discharge Orders   Future Orders Complete By Expires   Diet - low sodium heart healthy  As directed    Discharge instructions  As directed    Comments:     Call if you have any lightheadedness, dizziness, or passing out spells.  Push fluids.  Monitor BP and call if running above 150/90 or below 100/50.   Increase activity slowly  As  directed       Disposition: to home with wife  Follow-up Appts: Follow-up with Dr. Clelia Croft at [facility] in 2 weeks (after upcoming trip to [place])  Condition on Discharge: stable  Tests Needing Follow-up:  None- consider Holter monitor if any further symptoms  Signed: SHAW,W DOUGLAS 01/12/2013, 10:23 AM

## 2013-01-12 NOTE — ED Provider Notes (Signed)
I saw and evaluated the patient, reviewed the resident's note and I agree with the findings and plan.   Given age and initially low BP feel obs admission for syncope is appropriate.   Audree Camel, MD 01/12/13 1201

## 2013-01-12 NOTE — H&P (Signed)
PCP:   Kari Baars, MD   Chief Complaint:  Passed out  HPI: Kevin Page is a 79 year old white male with a history of hypertension, BPH, and possible TIA (8/13) who presented to the emergency department following a syncopal episode. Patient states that he had a long day today which began at the eye doctor for dilated eye exam. He had a milk shake for lunch. This evening he went to a gathering at the [employer] building where he walked for about one hour. He did not eat anything at this gathering but did drink about one beer. Near the end of the walk he became lightheaded and sat down. He says only had a brief loss of consciousness which he fell out of the chair. This was witnessed with no seizure activity. Also consciousness was less than 1 minute. EMS was activated and he was found have a blood pressure of 91/59, CBG 125. He was given normal saline 125 mL with a follow blood pressure of 108/61. In the emergency department his EKG shows sinus arrhythmia with no acute ST changes. Cardiac enzymes are negative. Telemetry does show some occasional PACs and PVCs.  Currently, the patient is without complaint. He denies any recent chest pain or palpitations. No prior history of syncope. He did have a possible TIA exactly one year ago which was characterized by some mild confusion and unsteady gait. This may been alcohol related. He had an MRI/MRA and carotid Dopplers performed at that time that were normal. He has not had any recurrence of symptoms since that time. His blood pressures at home have been consistently below 130/80 but no hypotension. He reports occasional lightheadedness when standing from a stooped over position.  Review of Systems:  Review of Systems - All systems reviewed with patient and are negative except in history of present illness to Past Medical History: Past Medical History  Diagnosis Date  . Allergy     dust and mites  . Hyperlipidemia   . BPH (benign prostatic  hyperplasia)   HTN BPH with elevated PSA- negative biopsy (2/10) COPD- seen on CT 4/10 Allergic Rhinitis/chronic congestion OA, knees-  Macular Degeneration- Dr. Hazle Quant ?TIA/episode of confusion- 8/13- negative MRA, carotids  Past Surgical History  Procedure Laterality Date  . Cataract extraction w/ intraocular lens  implant, bilateral  2011  . Lumbar fusion  1994  . Dupytron contractions  1998 , 2006    both hands     Medications: Prior to Admission medications   Medication Sig Start Date End Date Taking? Authorizing Provider  aspirin EC 81 MG tablet Take 81 mg by mouth daily.   Yes Historical Provider, MD  Azelastine-Fluticasone 137-50 MCG/ACT SUSP Place 1 spray into the nose as needed (congestion).    Yes Historical Provider, MD  dutasteride (AVODART) 0.5 MG capsule Take 0.5 mg by mouth daily.   Yes Historical Provider, MD  lisinopril (PRINIVIL,ZESTRIL) 10 MG tablet Take 10 mg by mouth daily.   Yes Historical Provider, MD  Multiple Vitamins-Minerals (MH MACULAR HEALTH PO) Take 2 tablets by mouth daily.   Yes Historical Provider, MD  simvastatin (ZOCOR) 20 MG tablet Take 1 tablet by mouth Daily. 10/19/11  Yes Historical Provider, MD  Tamsulosin HCl (FLOMAX) 0.4 MG CAPS Take 1 tablet by mouth Daily. 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[employer],  Floria Raveling Quit 1994- 1ppd X 35 years 1-2 drinks per day  Family History: Father- dCVA (46s) Mother- d 67, Asthma Sister- AML d69- he donated stem cells Brother Drenda Freeze)- Throat Cancer, CVAs, Leukemia; deceased  1 natural son- healthy  Physical Exam: Filed Vitals:   01/11/13 2303 01/12/13 0028 01/12/13 0030 01/12/13 0031  BP: 136/69 158/76 171/76 148/86  Pulse: 66 78 90 76  Temp: 98.3 F (36.8 C) 98.4 F (36.9 C)    TempSrc: Oral Oral     Resp: 16 18 18 18   SpO2: 97% 98% 96% 97%   General appearance: alert and no distress Head: Normocephalic, without obvious abnormality, atraumatic Eyes: conjunctivae/corneas clear. PERRL, EOM's intact.  Nose: Nares normal. Septum midline. Mucosa normal. No drainage or sinus tenderness. Throat: lips, mucosa, and tongue normal; teeth and gums normal Neck: no adenopathy, no carotid bruit, no JVD and thyroid not enlarged, symmetric, no tenderness/mass/nodules Resp: clear to auscultation bilaterally Cardio: Distant heart sounds with occasional ectopy; otherwise regular rate and rhythm without murmurs rubs or gallop GI: soft, non-tender; bowel sounds normal; no masses,  no organomegaly Extremities: extremities normal, atraumatic, no cyanosis or edema Pulses: 2+ and symmetric Lymph nodes: Cervical adenopathy: no cervical lymphadenopathy Neurologic: Alert and oriented X 3, normal strength and tone. Normal symmetric reflexes. Finger-nose is intact  Labs on Admission:   Recent Labs  01/11/13 2200 01/11/13 2219  NA 137 138  K 4.3 4.4  CL 101 101  CO2 27  --   GLUCOSE 123* 123*  BUN 15 14  CREATININE 1.01 1.10  CALCIUM 9.2  --     Recent Labs  01/11/13 2200  AST 24  ALT 16  ALKPHOS 49  BILITOT 0.4  PROT 6.1  ALBUMIN 3.7     Recent Labs  01/11/13 2200 01/11/13 2219  WBC 12.5*  --   NEUTROABS 11.1*  --   HGB 13.2 13.9  HCT 38.4* 41.0  MCV 93.2  --   PLT 214  --     Recent Labs  01/11/13 2150  TROPONINI <0.30   Lab Results  Component Value Date   INR 1.0 09/14/2008    Radiological Exams on Admission: Dg Chest 2 View  01/11/2013   *RADIOLOGY REPORT*  Clinical Data: Syncope  CHEST - 2 VIEW  Comparison: 06/14/2009  Findings: The cardiac shadow is stable.  Persistent elevation left hemidiaphragm is seen.  The lungs remain clear.  No acute bony abnormality is noted.  IMPRESSION: No acute abnormalities seen.   Original Report Authenticated By: Alcide Clever, M.D.    Orders placed during the hospital encounter of 01/11/13  . ED EKG- Sinus rhythm with PACs. NSST changes    Assessment/Plan Principal Problem: 1. Syncope- he has a relatively low-risk history characterized by presyncopal lightheadedness. This was likely a orthostatic or vasovagal event in the setting of poor by mouth intake and relative hypotension. However, given his PACs on telemetry I think it would be prudent to monitor him overnight to rule out arrhythmia. Rule out myocardial infarction with serial cardiac enzymes. Will obtain Echocardiogram.  Consider outpatient Holter monitor.  Active Problems: 2. Hypertension- blood pressure was initially low but has now normalized.  We'll continue home blood pressure medications and monitor. 3. Hyperlipidemia- continue statin 4. BPH (benign prostatic hyperplasia)- Flomax may contribute to orthostasis. We'll monitor for recurrent symptoms. 5. Disposition-anticipate discharge to home within 24 hours after echocardiogram if no further events on telemetry and cardiac enzymes negative.   Kevin Page,W DOUGLAS 01/12/2013, 12:34 AM

## 2013-01-12 NOTE — Progress Notes (Signed)
Echocardiogram 2D Echocardiogram has been performed.  Kevin Page 01/12/2013, 9:22 AM

## 2013-01-12 NOTE — Care Management Utilization Note (Signed)
UR completed 

## 2013-01-30 DIAGNOSIS — R001 Bradycardia, unspecified: Secondary | ICD-10-CM | POA: Insufficient documentation

## 2013-02-10 ENCOUNTER — Encounter: Payer: Self-pay | Admitting: *Deleted

## 2013-02-13 ENCOUNTER — Encounter: Payer: Self-pay | Admitting: Cardiology

## 2013-02-13 ENCOUNTER — Encounter: Payer: Self-pay | Admitting: *Deleted

## 2013-02-14 ENCOUNTER — Encounter: Payer: Self-pay | Admitting: Cardiology

## 2013-02-14 ENCOUNTER — Ambulatory Visit (INDEPENDENT_AMBULATORY_CARE_PROVIDER_SITE_OTHER): Payer: Medicare Other | Admitting: Cardiology

## 2013-02-14 VITALS — BP 104/62 | HR 61 | Ht 71.0 in | Wt 155.7 lb

## 2013-02-14 DIAGNOSIS — R55 Syncope and collapse: Secondary | ICD-10-CM

## 2013-02-14 DIAGNOSIS — E785 Hyperlipidemia, unspecified: Secondary | ICD-10-CM

## 2013-02-14 LAB — TSH: TSH: 0.89 u[IU]/mL (ref 0.35–5.50)

## 2013-02-14 NOTE — Progress Notes (Signed)
Patient ID: Kevin Page, male   DOB: 1930-07-30, 79 y.o.   MRN: [id number]    Patient Name: Kevin Page Date of Encounter: 02/14/2013  Primary Care Provider:  Kari Baars, MD Primary Cardiologist:  Tobias Alexander, H   Patient Profile Syncope, bradycardia  Problem List   Past Medical History  Diagnosis Date  . Allergy     dust and mites  . Hyperlipidemia   . BPH (benign prostatic hyperplasia) 2/10  . Unspecified essential hypertension   . Osteoarthritis of both knees   . Macular degeneration    Past Surgical History  Procedure Laterality Date  . Cataract extraction w/ intraocular lens  implant, bilateral Bilateral 2011  . Lumbar fusion  1994  . Dupytron contractions  1998 , 2006    both hands   Allergies  No Known Allergies  HPI  78 year old male younger appearing with h/o hypertensiom, hyperlipidemia who had an episode of syncope in late August. He describes a day that was stressful, he was not drinking enough fluids. He denies any chest pain, palpitations, just felt lightheaded from to the syncopal episode. He states that he was never very athletic or actively engaged in sport. He only walks short distances and higher level of exertion makes him feel SOB.   Home Medications  Prior to Admission medications   Medication Sig Start Date End Date Taking? Authorizing Provider  aspirin EC 81 MG tablet Take 81 mg by mouth daily.    Historical Provider, MD  Azelastine-Fluticasone 137-50 MCG/ACT SUSP Place 1 spray into the nose as needed (congestion).     Historical Provider, MD  dutasteride (AVODART) 0.5 MG capsule Take 0.5 mg by mouth daily.    Historical Provider, MD  lisinopril (PRINIVIL,ZESTRIL) 10 MG tablet Take 10 mg by mouth daily.    Historical Provider, MD  Multiple Vitamins-Minerals (MH MACULAR HEALTH PO) Take 2 tablets by mouth daily.    Historical Provider, MD  simvastatin (ZOCOR) 20 MG tablet Take 1 tablet by mouth Daily. 10/19/11   Historical Provider, MD    Tamsulosin HCl (FLOMAX) 0.4 MG CAPS Take 1 tablet by mouth Daily. 09/22/11   Historical Provider, MD    Family History  Family History  Problem Relation Age of Onset  . Cancer Brother     tonsils    Social History  History   Social History  . Marital Status: Married    Spouse Name: N/A    Number of Children: N/A  . Years of Education: N/A   Occupational History  . Not on file.   Social History Main Topics  . Smoking status: Former Games developer  . Smokeless tobacco: Never Used  . Alcohol Use: 6.0 oz/week    12 drink(s) per week  . Drug Use: No  . Sexual Activity: Not on file   Other Topics Concern  . Not on file   Social History Narrative  . No narrative on file     Review of Systems General:  No chills, fever, night sweats or weight changes.  Cardiovascular:  No chest pain, dyspnea on exertion, edema, orthopnea, palpitations, paroxysmal nocturnal dyspnea. Dermatological: No rash, lesions/masses Respiratory: No cough, dyspnea Urologic: No hematuria, dysuria Abdominal:   No nausea, vomiting, diarrhea, bright red blood per rectum, melena, or hematemesis Neurologic:  No visual changes, wkns, changes in mental status. All other systems reviewed and are otherwise negative except as noted above.  Physical Exam  There were no vitals taken for this visit.  General: Pleasant, NAD  Psych: Normal affect. Neuro: Alert and oriented X 3. Moves all extremities spontaneously. HEENT: Normal  Neck: Supple without bruits or JVD. Lungs:  Resp regular and unlabored, CTA. Heart: RRR no s3, s4, or murmurs. Abdomen: Soft, non-tender, non-distended, BS + x 4.  Extremities: No clubbing, cyanosis or edema. DP/PT/Radials 2+ and equal bilaterally.  TTE 01/12/2013 Left ventricle: The cavity size was normal. Systolic function was normal. The estimated ejection fraction was in the range of 55% to 60%. Wall motion was normal; there were no regional wall motion abnormalities.  Doppler parameters are consistent with abnormal left ventricular relaxation (grade 1 diastolic dysfunction). - Aortic valve: Mild regurgitation. - Aortic root: The aortic root was mildly dilated. - Mitral valve: Mild regurgitation. - Left atrium: The atrium was mildly to moderately dilated.    Accessory Clinical Findings  ECG - SR, 61 BPM, no T wave abnormalities, sinus arrhythmia   Assessment & Plan  A very pleasant gentleman with an episode of syncope and concern for bradycardia  1. Syncope - appears to be due to dehydration, his baseline ECG shows SB/SR with significant sinus arrhythmia. He brings his own heart rate measurements with bradycardia down to 40'. We will order a 48 hour Holter monitor to rule out significant bradycardia/pauses. Order TSH.  2. Exertional SOB - we will order an exercise treadmill test to assess for chronotropic incompetence.  3. Hypertension - controlled  4. Hyperlipidemia - followed by his PCP, last checked in [id number] with LDL 111, TAG 81, HDL 51, simvastatin was increased to 20 mg QHS.  Follow up in 3 weeks  Tobias Alexander, Rexene Edison, MD 02/14/2013, 1:54 PM

## 2013-02-14 NOTE — Patient Instructions (Signed)
Your physician has recommended that you wear a holter monitor. Holter monitors are medical devices that record the heart's electrical activity. Doctors most often use these monitors to diagnose arrhythmias. Arrhythmias are problems with the speed or rhythm of the heartbeat. The monitor is a small, portable device. You can wear one while you do your normal daily activities. This is usually used to diagnose what is causing palpitations/syncope (passing out).  Your physician has requested that you have an exercise tolerance test. For further information please visit [email or web address] Please also follow instruction sheet, as given.  Your physician recommends that you schedule a follow-up appointment in: 3 weeks

## 2013-02-14 NOTE — Addendum Note (Signed)
Addended by: Demetrios Loll on: 02/14/2013 02:55 PM   Modules accepted: Orders

## 2013-03-02 ENCOUNTER — Encounter (INDEPENDENT_AMBULATORY_CARE_PROVIDER_SITE_OTHER): Payer: Medicare Other

## 2013-03-02 ENCOUNTER — Encounter: Payer: Self-pay | Admitting: Radiology

## 2013-03-02 DIAGNOSIS — R55 Syncope and collapse: Secondary | ICD-10-CM

## 2013-03-02 NOTE — Progress Notes (Signed)
Patient ID: Kevin Page, male   DOB: [birth year], 79 y.o.   MRN: [id number] E cardio 48 Holter monitor applied

## 2013-03-09 ENCOUNTER — Ambulatory Visit: Payer: Medicare Other | Admitting: Cardiology

## 2013-03-21 ENCOUNTER — Encounter (INDEPENDENT_AMBULATORY_CARE_PROVIDER_SITE_OTHER): Payer: Self-pay

## 2013-03-21 ENCOUNTER — Ambulatory Visit (INDEPENDENT_AMBULATORY_CARE_PROVIDER_SITE_OTHER): Payer: Medicare Other | Admitting: Physician Assistant

## 2013-03-21 DIAGNOSIS — R0602 Shortness of breath: Secondary | ICD-10-CM

## 2013-03-21 DIAGNOSIS — R55 Syncope and collapse: Secondary | ICD-10-CM

## 2013-03-21 NOTE — Progress Notes (Signed)
Exercise Treadmill Test  Pre-Exercise Testing Evaluation Rhythm: sinus bradycardia  Rate: 53     Test  Exercise Tolerance Test Ordering MD: Tobias Alexander, MD  Interpreting MD: Tereso Newcomer, PA-C  Unique Test No: 1   Treadmill:  1  Indication for ETT: Syncope, Shortness of breath  Contraindication to ETT: No   Stress Modality: exercise - treadmill  Cardiac Imaging Performed: non   Protocol: standard Bruce - maximal  Max BP:  181/88  Max MPHR (bpm):  140 85% MPR (bpm):  119  MPHR obtained (bpm):  123 % MPHR obtained:  88  Reached 85% MPHR (min:sec):  4:05 Total Exercise Time (min-sec):  4:17  Workload in METS:  6.1 Borg Scale: 17  Reason ETT Terminated:  dyspnea    ST Segment Analysis At Rest: normal ST segments - no evidence of significant ST depression With Exercise: non-specific ST changes  Other Information Arrhythmia:  No Angina during ETT:  absent (0) Quality of ETT:  diagnostic  ETT Interpretation:  normal - no evidence of ischemia by ST analysis  Comments: Fair exercise capacity. No chest pain.  He did complain of significant dyspnea with peak exercise. O2 sats on RA 93-98% Normal BP response to exercise. No significant ST changes at peak exercise to suggest ischemia.  Recommendations: F/u with Dr. Tobias Alexander as directed. Signed,  Tereso Newcomer, PA-C   03/21/2013 10:13 AM

## 2013-03-23 ENCOUNTER — Encounter: Payer: Self-pay | Admitting: Cardiology

## 2013-03-23 ENCOUNTER — Ambulatory Visit (INDEPENDENT_AMBULATORY_CARE_PROVIDER_SITE_OTHER): Payer: Medicare Other | Admitting: Cardiology

## 2013-03-23 VITALS — BP 127/70 | HR 76 | Ht 71.0 in | Wt 155.0 lb

## 2013-03-23 DIAGNOSIS — I10 Essential (primary) hypertension: Secondary | ICD-10-CM

## 2013-03-23 DIAGNOSIS — E785 Hyperlipidemia, unspecified: Secondary | ICD-10-CM

## 2013-03-23 DIAGNOSIS — R55 Syncope and collapse: Secondary | ICD-10-CM

## 2013-03-23 NOTE — Progress Notes (Signed)
Patient ID: Kevin Page, male   DOB: May 02, 1931, 79 y.o.   MRN: [id number]  Patient Name: Kevin Page Date of Encounter: 03/23/2013  Primary Care Provider:  Kari Baars, MD Primary Cardiologist:  Tobias Alexander, H   Patient Profile Syncope, bradycardia  Problem List   Past Medical History  Diagnosis Date  . Allergy     dust and mites  . Hyperlipidemia   . BPH (benign prostatic hyperplasia) 2/10  . Unspecified essential hypertension   . Osteoarthritis of both knees   . Macular degeneration    Past Surgical History  Procedure Laterality Date  . Cataract extraction w/ intraocular lens  implant, bilateral Bilateral 2011  . Lumbar fusion  1994  . Dupytron contractions  1998 , 2006    both hands   Allergies  No Known Allergies  HPI  80 year old male younger appearing with h/o hypertensiom, hyperlipidemia who had an episode of syncope in late August. He describes a day that was stressful, he was not drinking enough fluids. He denies any chest pain, palpitations, just felt lightheaded from to the syncopal episode. He states that he was never very athletic or actively engaged in sport. He only walks short distances and higher level of exertion makes him feel SOB.   Home Medications  Prior to Admission medications   Medication Sig Start Date End Date Taking? Authorizing Provider  aspirin EC 81 MG tablet Take 81 mg by mouth daily.    Historical Provider, MD  Azelastine-Fluticasone 137-50 MCG/ACT SUSP Place 1 spray into the nose as needed (congestion).     Historical Provider, MD  dutasteride (AVODART) 0.5 MG capsule Take 0.5 mg by mouth daily.    Historical Provider, MD  lisinopril (PRINIVIL,ZESTRIL) 10 MG tablet Take 10 mg by mouth daily.    Historical Provider, MD  Multiple Vitamins-Minerals (MH MACULAR HEALTH PO) Take 2 tablets by mouth daily.    Historical Provider, MD  simvastatin (ZOCOR) 20 MG tablet Take 1 tablet by mouth Daily. 10/19/11   Historical Provider, MD    Tamsulosin HCl (FLOMAX) 0.4 MG CAPS Take 1 tablet by mouth Daily. 09/22/11   Historical Provider, MD    Family History  Family History  Problem Relation Age of Onset  . Cancer Brother     tonsils    Social History  History   Social History  . Marital Status: Married    Spouse Name: N/A    Number of Children: N/A  . Years of Education: N/A   Occupational History  . Not on file.   Social History Main Topics  . Smoking status: Former Games developer  . Smokeless tobacco: Never Used  . Alcohol Use: 6.0 oz/week    12 drink(s) per week  . Drug Use: No  . Sexual Activity: Not on file   Other Topics Concern  . Not on file   Social History Narrative  . No narrative on file     Review of Systems General:  No chills, fever, night sweats or weight changes.  Cardiovascular:  No chest pain, dyspnea on exertion, edema, orthopnea, palpitations, paroxysmal nocturnal dyspnea. Dermatological: No rash, lesions/masses Respiratory: No cough, dyspnea Urologic: No hematuria, dysuria Abdominal:   No nausea, vomiting, diarrhea, bright red blood per rectum, melena, or hematemesis Neurologic:  No visual changes, wkns, changes in mental status. All other systems reviewed and are otherwise negative except as noted above.  Physical Exam  Blood pressure 127/70, pulse 76, height 5\' 11"  (1.803 m), weight 155  lb (70.308 kg).  General: Pleasant, NAD Psych: Normal affect. Neuro: Alert and oriented X 3. Moves all extremities spontaneously. HEENT: Normal  Neck: Supple without bruits or JVD. Lungs:  Resp regular and unlabored, CTA. Heart: RRR no s3, s4, or murmurs. Abdomen: Soft, non-tender, non-distended, BS + x 4.  Extremities: No clubbing, cyanosis or edema. DP/PT/Radials 2+ and equal bilaterally.  TTE 01/12/2013 Left ventricle: The cavity size was normal. Systolic function was normal. The estimated ejection fraction was in the range of 55% to 60%. Wall motion was normal; there were no regional  wall motion abnormalities. Doppler parameters are consistent with abnormal left ventricular relaxation (grade 1 diastolic dysfunction). - Aortic valve: Mild regurgitation. - Aortic root: The aortic root was mildly dilated. - Mitral valve: Mild regurgitation. - Left atrium: The atrium was mildly to moderately dilated.    Accessory Clinical Findings  ECG - SR, 61 BPM, no T wave abnormalities, sinus arrhythmia   Assessment & Plan  A very pleasant gentleman with an episode of syncope and concern for bradycardia  1. Syncope - appears to be due to dehydration, his baseline ECG shows SB/SR with significant sinus arrhythmia. He brings his own heart rate measurements with bradycardia down to 40'. TSH was normal. 48 hour Holter showed episodes of sinus bradycardia with longest pause 1.8 seconds. He also had few episodes of SVT, the longest run lasting 12 beats with rate 128 BPM.  This qualifies for a sick sinus syndrome however it doesn't qualify for a pacemaker placement yet. The patient is currently asymptomatic. We will follow him. He was explained to call us if he develops any symptoms of dizziness, or a syncope.  2. Exertional SOB - The patient has no ischemic changes on a treadmill stress test, no chronotropic incompetence. He showed only fair exercise capacity, he is encouraged to exercise more. He is currently not engaged in any physical activity. He was advised to walk at least 3 times per week for 30 minutes.  3. Hypertension - controlled  4. Hyperlipidemia - followed by his PCP, last checked in May 2014 with LDL 111, TAG 81, HDL 51, simvastatin was increased to 20 mg QHS.  Follow up in 1 year unless there is any chenge in his symptoms    Tobias Alexander, Rexene Edison, MD 03/23/2013, 10:51 AM

## 2013-03-23 NOTE — Patient Instructions (Signed)
**Note De-identified  Obfuscation** Your physician wants you to follow-up in: 1 year  You will receive a reminder letter in the mail two months in advance. If you don't receive a letter, please call our office to schedule the follow-up appointment.  Your physician recommends that you continue on your current medications as directed. Please refer to the Current Medication list given to you today.  

## 2013-11-01 DIAGNOSIS — Z720 Tobacco use: Secondary | ICD-10-CM | POA: Insufficient documentation

## 2013-11-01 DIAGNOSIS — R159 Full incontinence of feces: Secondary | ICD-10-CM | POA: Insufficient documentation

## 2013-11-01 DIAGNOSIS — F17201 Nicotine dependence, unspecified, in remission: Secondary | ICD-10-CM | POA: Insufficient documentation

## 2013-11-13 DIAGNOSIS — M810 Age-related osteoporosis without current pathological fracture: Secondary | ICD-10-CM | POA: Insufficient documentation

## 2014-08-28 ENCOUNTER — Other Ambulatory Visit (HOSPITAL_COMMUNITY): Payer: Self-pay | Admitting: Internal Medicine

## 2014-09-04 ENCOUNTER — Encounter (HOSPITAL_COMMUNITY)
Admission: RE | Admit: 2014-09-04 | Discharge: 2014-09-04 | Disposition: A | Discharge status: Home / Self Care | Payer: Medicare HMO | Source: Ambulatory Visit | Attending: Internal Medicine | Admitting: Internal Medicine

## 2014-09-04 DIAGNOSIS — M810 Age-related osteoporosis without current pathological fracture: Secondary | ICD-10-CM | POA: Diagnosis not present

## 2014-09-04 MED ORDER — DENOSUMAB 60 MG/ML ~~LOC~~ SOLN
60.0000 mg | Freq: Once | SUBCUTANEOUS | Status: AC
  Administered 2014-09-04: 60 mg via SUBCUTANEOUS
  Filled 2014-09-04: qty 1

## 2014-09-04 NOTE — Discharge Instructions (Signed)
PROLIA °Denosumab injection °What is this medicine? °DENOSUMAB (den oh sue mab) slows bone breakdown. Prolia is used to treat osteoporosis in women after menopause and in men. Xgeva is used to prevent bone fractures and other bone problems caused by cancer bone metastases. Xgeva is also used to treat giant cell tumor of the bone. °This medicine may be used for other purposes; ask your health care provider or pharmacist if you have questions. °COMMON BRAND NAME(S): Prolia, XGEVA °What should I tell my health care provider before I take this medicine? °They need to know if you have any of these conditions: °-dental disease °-eczema °-infection or history of infections °-kidney disease or on dialysis °-low blood calcium or vitamin D °-malabsorption syndrome °-scheduled to have surgery or tooth extraction °-taking medicine that contains denosumab °-thyroid or parathyroid disease °-an unusual reaction to denosumab, other medicines, foods, dyes, or preservatives °-pregnant or trying to get pregnant °-breast-feeding °How should I use this medicine? °This medicine is for injection under the skin. It is given by a health care professional in a hospital or clinic setting. °If you are getting Prolia, a special MedGuide will be given to you by the pharmacist with each prescription and refill. Be sure to read this information carefully each time. °For Prolia, talk to your pediatrician regarding the use of this medicine in children. Special care may be needed. For Xgeva, talk to your pediatrician regarding the use of this medicine in children. While this drug may be prescribed for children as young as 13 years for selected conditions, precautions do apply. °Overdosage: If you think you've taken too much of this medicine contact a poison control center or emergency room at once. °Overdosage: If you think you have taken too much of this medicine contact a poison control center or emergency room at once. °NOTE: This medicine is only  for you. Do not share this medicine with others. °What if I miss a dose? °It is important not to miss your dose. Call your doctor or health care professional if you are unable to keep an appointment. °What may interact with this medicine? °Do not take this medicine with any of the following medications: °-other medicines containing denosumab °This medicine may also interact with the following medications: °-medicines that suppress the immune system °-medicines that treat cancer °-steroid medicines like prednisone or cortisone °This list may not describe all possible interactions. Give your health care provider a list of all the medicines, herbs, non-prescription drugs, or dietary supplements you use. Also tell them if you smoke, drink alcohol, or use illegal drugs. Some items may interact with your medicine. °What should I watch for while using this medicine? °Visit your doctor or health care professional for regular checks on your progress. Your doctor or health care professional may order blood tests and other tests to see how you are doing. °Call your doctor or health care professional if you get a cold or other infection while receiving this medicine. Do not treat yourself. This medicine may decrease your body's ability to fight infection. °You should make sure you get enough calcium and vitamin D while you are taking this medicine, unless your doctor tells you not to. Discuss the foods you eat and the vitamins you take with your health care professional. °See your dentist regularly. Brush and floss your teeth as directed. Before you have any dental work done, tell your dentist you are receiving this medicine. °Do not become pregnant while taking this medicine or for 5 months after   stopping it. Women should inform their doctor if they wish to become pregnant or think they might be pregnant. There is a potential for serious side effects to an unborn child. Talk to your health care professional or pharmacist for  more information. °What side effects may I notice from receiving this medicine? °Side effects that you should report to your doctor or health care professional as soon as possible: °-allergic reactions like skin rash, itching or hives, swelling of the face, lips, or tongue °-breathing problems °-chest pain °-fast, irregular heartbeat °-feeling faint or lightheaded, falls °-fever, chills, or any other sign of infection °-muscle spasms, tightening, or twitches °-numbness or tingling °-skin blisters or bumps, or is dry, peels, or red °-slow healing or unexplained pain in the mouth or jaw °-unusual bleeding or bruising °Side effects that usually do not require medical attention (Report these to your doctor or health care professional if they continue or are bothersome.): °-muscle pain °-stomach upset, gas °This list may not describe all possible side effects. Call your doctor for medical advice about side effects. You may report side effects to FDA at 1-800-FDA-1088. °Where should I keep my medicine? °This medicine is only given in a clinic, doctor's office, or other health care setting and will not be stored at home. °NOTE: This sheet is a summary. It may not cover all possible information. If you have questions about this medicine, talk to your doctor, pharmacist, or health care provider. °© 2015, [employer]. (2011-11-02 12:37:47) °Osteoporosis °Throughout your life, your body breaks down old bone and replaces it with new bone. As you get older, your body does not replace bone as quickly as it breaks it down. By the age of 30 years, most people begin to gradually lose bone because of the imbalance between bone loss and replacement. Some people lose more bone than others. Bone loss beyond a specified normal degree is considered osteoporosis.  °Osteoporosis affects the strength and durability of your bones. The inside of the ends of your bones and your flat bones, like the bones of your pelvis, look like  honeycomb, filled with tiny open spaces. As bone loss occurs, your bones become less dense. This means that the open spaces inside your bones become bigger and the walls between these spaces become thinner. This makes your bones weaker. Bones of a person with osteoporosis can become so weak that they can break (fracture) during minor accidents, such as a simple fall. °CAUSES  °The following factors have been associated with the development of osteoporosis: °· Smoking. °· Drinking more than 2 alcoholic drinks several days per week. °· Long-term use of certain medicines: °¨ Corticosteroids. °¨ Chemotherapy medicines. °¨ Thyroid medicines. °¨ Antiepileptic medicines. °¨ Gonadal hormone suppression medicine. °¨ Immunosuppression medicine. °· Being underweight. °· Lack of physical activity. °· Lack of exposure to the sun. This can lead to vitamin D deficiency. °· Certain medical conditions: °¨ Certain inflammatory bowel diseases, such as Crohn disease and ulcerative colitis. °¨ Diabetes. °¨ Hyperthyroidism. °¨ Hyperparathyroidism. °RISK FACTORS °Anyone can develop osteoporosis. However, the following factors can increase your risk of developing osteoporosis: °· Gender--Women are at higher risk than men. °· Age--Being older than 50 years increases your risk. °· Ethnicity--White and Asian people have an increased risk. °· Weight --Being extremely underweight can increase your risk of osteoporosis. °· Family history of osteoporosis--Having a family member who has developed osteoporosis can increase your risk. °SYMPTOMS  °Usually, people with osteoporosis have no symptoms.  °DIAGNOSIS  °Signs during   a physical exam that may prompt your caregiver to suspect osteoporosis include: °· Decreased height. This is usually caused by the compression of the bones that form your spine (vertebrae) because they have weakened and become fractured. °· A curving or rounding of the upper back (kyphosis). °To confirm signs of osteoporosis,  your caregiver may request a procedure that uses 2 low-dose X-ray beams with different levels of energy to measure your bone mineral density (dual-energy X-ray absorptiometry [DXA]). Also, your caregiver may check your level of vitamin D. °TREATMENT  °The goal of osteoporosis treatment is to strengthen bones in order to decrease the risk of bone fractures. There are different types of medicines available to help achieve this goal. Some of these medicines work by slowing the processes of bone loss. Some medicines work by increasing bone density. Treatment also involves making sure that your levels of calcium and vitamin D are adequate. °PREVENTION  °There are things you can do to help prevent osteoporosis. Adequate intake of calcium and vitamin D can help you achieve optimal bone mineral density. Regular exercise can also help, especially resistance and weight-bearing activities. If you smoke, quitting smoking is an important part of osteoporosis prevention. °MAKE SURE YOU: °· Understand these instructions. °· Will watch your condition. °· Will get help right away if you are not doing well or get worse. °FOR MORE INFORMATION °www.osteo.org and www.nof.org °Document Released: 02/11/2005 Document Revised: 08/29/2012 Document Reviewed: 04/18/2011 °ExitCare® Patient Information ©2015 [employer]. This information is not intended to replace advice given to you by your health care provider. Make sure you discuss any questions you have with your health care provider. ° ° °

## 2014-11-05 DIAGNOSIS — R0982 Postnasal drip: Secondary | ICD-10-CM | POA: Insufficient documentation

## 2015-03-05 ENCOUNTER — Encounter (HOSPITAL_COMMUNITY)
Admission: RE | Admit: 2015-03-05 | Discharge: 2015-03-05 | Disposition: A | Discharge status: Home / Self Care | Payer: Medicare HMO | Source: Ambulatory Visit | Attending: Internal Medicine | Admitting: Internal Medicine

## 2015-03-05 DIAGNOSIS — M810 Age-related osteoporosis without current pathological fracture: Secondary | ICD-10-CM | POA: Insufficient documentation

## 2015-03-07 DIAGNOSIS — H1131 Conjunctival hemorrhage, right eye: Secondary | ICD-10-CM | POA: Diagnosis not present

## 2015-04-01 DIAGNOSIS — H353132 Nonexudative age-related macular degeneration, bilateral, intermediate dry stage: Secondary | ICD-10-CM | POA: Diagnosis not present

## 2015-04-01 DIAGNOSIS — H43813 Vitreous degeneration, bilateral: Secondary | ICD-10-CM | POA: Diagnosis not present

## 2015-04-01 DIAGNOSIS — Z961 Presence of intraocular lens: Secondary | ICD-10-CM | POA: Diagnosis not present

## 2015-04-01 DIAGNOSIS — H538 Other visual disturbances: Secondary | ICD-10-CM | POA: Diagnosis not present

## 2015-04-17 DIAGNOSIS — R55 Syncope and collapse: Secondary | ICD-10-CM | POA: Diagnosis not present

## 2015-04-17 DIAGNOSIS — I10 Essential (primary) hypertension: Secondary | ICD-10-CM | POA: Diagnosis not present

## 2015-04-17 DIAGNOSIS — Z6822 Body mass index (BMI) 22.0-22.9, adult: Secondary | ICD-10-CM | POA: Diagnosis not present

## 2015-04-17 DIAGNOSIS — R159 Full incontinence of feces: Secondary | ICD-10-CM | POA: Diagnosis not present

## 2015-04-17 DIAGNOSIS — R0982 Postnasal drip: Secondary | ICD-10-CM | POA: Diagnosis not present

## 2015-05-09 ENCOUNTER — Encounter (HOSPITAL_COMMUNITY): Payer: Medicare HMO

## 2015-07-18 DIAGNOSIS — H524 Presbyopia: Secondary | ICD-10-CM | POA: Diagnosis not present

## 2015-07-18 DIAGNOSIS — H47013 Ischemic optic neuropathy, bilateral: Secondary | ICD-10-CM | POA: Diagnosis not present

## 2015-07-18 DIAGNOSIS — H353132 Nonexudative age-related macular degeneration, bilateral, intermediate dry stage: Secondary | ICD-10-CM | POA: Diagnosis not present

## 2015-08-19 DIAGNOSIS — H53413 Scotoma involving central area, bilateral: Secondary | ICD-10-CM | POA: Diagnosis not present

## 2015-08-19 DIAGNOSIS — H353132 Nonexudative age-related macular degeneration, bilateral, intermediate dry stage: Secondary | ICD-10-CM | POA: Diagnosis not present

## 2015-10-07 DIAGNOSIS — R69 Illness, unspecified: Secondary | ICD-10-CM | POA: Diagnosis not present

## 2015-10-10 ENCOUNTER — Ambulatory Visit (INDEPENDENT_AMBULATORY_CARE_PROVIDER_SITE_OTHER): Payer: Medicare HMO | Admitting: Podiatry

## 2015-10-10 ENCOUNTER — Encounter: Payer: Self-pay | Admitting: Podiatry

## 2015-10-10 DIAGNOSIS — B351 Tinea unguium: Secondary | ICD-10-CM

## 2015-10-10 DIAGNOSIS — M2040 Other hammer toe(s) (acquired), unspecified foot: Secondary | ICD-10-CM

## 2015-10-10 NOTE — Progress Notes (Signed)
   Subjective:    Patient ID: Kevin Page, male    DOB: 1931-02-28, 82 y.o.   MRN: [id number]  HPI Chief Complaint  Patient presents with  . Nail Problem    Bilateral; great toes; nail discoloration & thickened nails; pt stated, "wants all nails checked for nail fungus"  . Debridement    Bilateral nail trim      Review of Systems  HENT: Positive for hearing loss and sinus pressure.   Eyes: Positive for visual disturbance.  Endocrine: Positive for polyuria.  Genitourinary: Positive for frequency.  All other systems reviewed and are negative.      Objective:   Physical Exam        Assessment & Plan:

## 2015-10-10 NOTE — Progress Notes (Signed)
Subjective:     Patient ID: Kevin Page, male   DOB: 14-Sep-1930, 82 y.o.   MRN: [id number]  HPI patient presents with a thickened nailbeds 1-5 both feet that he cannot cut with some discoloration of the hallux nails that he's concerned about   Review of Systems  All other systems reviewed and are negative.      Objective:   Physical Exam  Constitutional: He is oriented to person, place, and time.  Cardiovascular: Intact distal pulses.   Musculoskeletal: Normal range of motion.  Neurological: He is oriented to person, place, and time.  Skin: Skin is warm and dry.  Nursing note and vitals reviewed.  Neurovascular status intact muscle strength adequate range of motion within normal limits with patient found to have thickened nailbeds 1-5 both feet with discoloration the hallux nails that's localized in nature and appears to be more trauma area patient's found have good digital perfusion and is well oriented 3     Assessment:     Damaged nailbeds 1-5 both feet with mycotic component and discoloration    Plan:     H&P conditions reviewed and debridement of nailbeds 1-5 both feet accomplished with no iatrogenic bleeding noted. Reappoint for routine care or earlier if needed

## 2015-11-06 DIAGNOSIS — E784 Other hyperlipidemia: Secondary | ICD-10-CM | POA: Diagnosis not present

## 2015-11-06 DIAGNOSIS — I10 Essential (primary) hypertension: Secondary | ICD-10-CM | POA: Diagnosis not present

## 2015-11-13 DIAGNOSIS — Z87898 Personal history of other specified conditions: Secondary | ICD-10-CM | POA: Diagnosis not present

## 2015-11-13 DIAGNOSIS — R0982 Postnasal drip: Secondary | ICD-10-CM | POA: Diagnosis not present

## 2015-11-13 DIAGNOSIS — M810 Age-related osteoporosis without current pathological fracture: Secondary | ICD-10-CM | POA: Diagnosis not present

## 2015-11-13 DIAGNOSIS — I10 Essential (primary) hypertension: Secondary | ICD-10-CM | POA: Diagnosis not present

## 2015-11-13 DIAGNOSIS — R159 Full incontinence of feces: Secondary | ICD-10-CM | POA: Diagnosis not present

## 2015-11-13 DIAGNOSIS — J449 Chronic obstructive pulmonary disease, unspecified: Secondary | ICD-10-CM | POA: Diagnosis not present

## 2015-11-13 DIAGNOSIS — G3184 Mild cognitive impairment, so stated: Secondary | ICD-10-CM | POA: Diagnosis not present

## 2015-11-13 DIAGNOSIS — E784 Other hyperlipidemia: Secondary | ICD-10-CM | POA: Diagnosis not present

## 2015-11-13 DIAGNOSIS — L570 Actinic keratosis: Secondary | ICD-10-CM | POA: Diagnosis not present

## 2015-11-13 DIAGNOSIS — Z0000 Encounter for general adult medical examination without abnormal findings: Secondary | ICD-10-CM | POA: Diagnosis not present

## 2015-12-11 DIAGNOSIS — L570 Actinic keratosis: Secondary | ICD-10-CM | POA: Diagnosis not present

## 2015-12-11 DIAGNOSIS — L821 Other seborrheic keratosis: Secondary | ICD-10-CM | POA: Diagnosis not present

## 2015-12-11 DIAGNOSIS — D225 Melanocytic nevi of trunk: Secondary | ICD-10-CM | POA: Diagnosis not present

## 2015-12-11 DIAGNOSIS — D1801 Hemangioma of skin and subcutaneous tissue: Secondary | ICD-10-CM | POA: Diagnosis not present

## 2015-12-11 DIAGNOSIS — C44319 Basal cell carcinoma of skin of other parts of face: Secondary | ICD-10-CM | POA: Diagnosis not present

## 2015-12-31 DIAGNOSIS — H353133 Nonexudative age-related macular degeneration, bilateral, advanced atrophic without subfoveal involvement: Secondary | ICD-10-CM | POA: Diagnosis not present

## 2015-12-31 DIAGNOSIS — H1132 Conjunctival hemorrhage, left eye: Secondary | ICD-10-CM | POA: Diagnosis not present

## 2016-01-09 ENCOUNTER — Encounter (INDEPENDENT_AMBULATORY_CARE_PROVIDER_SITE_OTHER): Payer: Self-pay

## 2016-01-09 ENCOUNTER — Ambulatory Visit (INDEPENDENT_AMBULATORY_CARE_PROVIDER_SITE_OTHER): Payer: Medicare HMO | Admitting: Podiatry

## 2016-01-09 DIAGNOSIS — B351 Tinea unguium: Secondary | ICD-10-CM | POA: Diagnosis not present

## 2016-01-09 DIAGNOSIS — M79676 Pain in unspecified toe(s): Secondary | ICD-10-CM | POA: Diagnosis not present

## 2016-01-10 NOTE — Progress Notes (Signed)
Subjective:     Patient ID: Kevin Page, male   DOB: 04/11/1931, 82 y.o.   MRN: [id number]  HPI patient presents with thick yellow brittle nailbeds 1-5 both feet that are painful and he cannot take care of   Review of Systems     Objective:   Physical Exam Neurovascular status intact with thick yellow brittle nailbeds 1-5 both feet    Assessment:     Mycotic nail infections with pain 1-5 both feet    Plan:

## 2016-01-18 ENCOUNTER — Emergency Department (HOSPITAL_COMMUNITY): Payer: Medicare HMO

## 2016-01-18 ENCOUNTER — Encounter (HOSPITAL_COMMUNITY): Payer: Self-pay | Admitting: Family Medicine

## 2016-01-18 ENCOUNTER — Observation Stay (HOSPITAL_COMMUNITY)
Admission: EM | Admit: 2016-01-18 | Discharge: 2016-01-20 | Disposition: A | Discharge status: Home / Self Care | Payer: Medicare HMO | Attending: Internal Medicine | Admitting: Internal Medicine

## 2016-01-18 ENCOUNTER — Other Ambulatory Visit: Payer: Self-pay

## 2016-01-18 DIAGNOSIS — I10 Essential (primary) hypertension: Secondary | ICD-10-CM | POA: Diagnosis present

## 2016-01-18 DIAGNOSIS — Z87891 Personal history of nicotine dependence: Secondary | ICD-10-CM | POA: Diagnosis not present

## 2016-01-18 DIAGNOSIS — Z7982 Long term (current) use of aspirin: Secondary | ICD-10-CM | POA: Insufficient documentation

## 2016-01-18 DIAGNOSIS — Z79899 Other long term (current) drug therapy: Secondary | ICD-10-CM | POA: Diagnosis not present

## 2016-01-18 DIAGNOSIS — Z7983 Long term (current) use of bisphosphonates: Secondary | ICD-10-CM | POA: Diagnosis not present

## 2016-01-18 DIAGNOSIS — E785 Hyperlipidemia, unspecified: Secondary | ICD-10-CM | POA: Diagnosis not present

## 2016-01-18 DIAGNOSIS — R531 Weakness: Secondary | ICD-10-CM | POA: Diagnosis not present

## 2016-01-18 DIAGNOSIS — I77810 Thoracic aortic ectasia: Secondary | ICD-10-CM

## 2016-01-18 DIAGNOSIS — N400 Enlarged prostate without lower urinary tract symptoms: Secondary | ICD-10-CM | POA: Diagnosis not present

## 2016-01-18 DIAGNOSIS — R001 Bradycardia, unspecified: Secondary | ICD-10-CM | POA: Diagnosis not present

## 2016-01-18 DIAGNOSIS — I951 Orthostatic hypotension: Secondary | ICD-10-CM

## 2016-01-18 DIAGNOSIS — R55 Syncope and collapse: Principal | ICD-10-CM | POA: Diagnosis present

## 2016-01-18 DIAGNOSIS — I351 Nonrheumatic aortic (valve) insufficiency: Secondary | ICD-10-CM

## 2016-01-18 DIAGNOSIS — R404 Transient alteration of awareness: Secondary | ICD-10-CM | POA: Diagnosis not present

## 2016-01-18 DIAGNOSIS — H3530 Unspecified macular degeneration: Secondary | ICD-10-CM | POA: Insufficient documentation

## 2016-01-18 HISTORY — DX: Syncope and collapse: R55

## 2016-01-18 HISTORY — DX: Supraventricular tachycardia, unspecified: I47.10

## 2016-01-18 HISTORY — DX: Essential (primary) hypertension: I10

## 2016-01-18 HISTORY — DX: Supraventricular tachycardia: I47.1

## 2016-01-18 HISTORY — DX: Bradycardia, unspecified: R00.1

## 2016-01-18 LAB — CBC
HEMATOCRIT: 41.1 % (ref 39.0–52.0)
HEMOGLOBIN: 13.1 g/dL (ref 13.0–17.0)
MCH: 31 pg (ref 26.0–34.0)
MCHC: 31.9 g/dL (ref 30.0–36.0)
MCV: 97.2 fL (ref 78.0–100.0)
Platelets: 192 10*3/uL (ref 150–400)
RBC: 4.23 MIL/uL (ref 4.22–5.81)
RDW: 13.6 % (ref 11.5–15.5)
WBC: 7 10*3/uL (ref 4.0–10.5)

## 2016-01-18 LAB — BASIC METABOLIC PANEL
ANION GAP: 8 (ref 5–15)
BUN: 17 mg/dL (ref 6–20)
CALCIUM: 9 mg/dL (ref 8.9–10.3)
CO2: 27 mmol/L (ref 22–32)
Chloride: 100 mmol/L — ABNORMAL LOW (ref 101–111)
Creatinine, Ser: 1.2 mg/dL (ref 0.61–1.24)
GFR, EST NON AFRICAN AMERICAN: 54 mL/min — AB (ref >60)
GLUCOSE: 117 mg/dL — AB (ref 65–99)
POTASSIUM: 4.3 mmol/L (ref 3.5–5.1)
SODIUM: 135 mmol/L (ref 135–145)

## 2016-01-18 LAB — I-STAT TROPONIN, ED: TROPONIN I, POC: 0 ng/mL (ref 0.00–0.08)

## 2016-01-18 LAB — TROPONIN I

## 2016-01-18 MED ORDER — ASPIRIN EC 81 MG PO TBEC
81.0000 mg | DELAYED_RELEASE_TABLET | Freq: Every day | ORAL | Status: DC
  Administered 2016-01-19 – 2016-01-20 (×2): 81 mg via ORAL
  Filled 2016-01-18 (×2): qty 1

## 2016-01-18 MED ORDER — HYDROCODONE-ACETAMINOPHEN 5-325 MG PO TABS: 1.0000 | ORAL_TABLET | ORAL | Status: DC | PRN

## 2016-01-18 MED ORDER — BRIMONIDINE TARTRATE 0.2 % OP SOLN
1.0000 [drp] | Freq: Every day | OPHTHALMIC | Status: DC
  Administered 2016-01-19 (×2): 1 [drp] via OPHTHALMIC
  Filled 2016-01-18: qty 5

## 2016-01-18 MED ORDER — SIMVASTATIN 20 MG PO TABS
20.0000 mg | ORAL_TABLET | Freq: Every day | ORAL | Status: DC
  Administered 2016-01-19: 20 mg via ORAL
  Filled 2016-01-18: qty 1

## 2016-01-18 MED ORDER — SODIUM CHLORIDE 0.9 % IV BOLUS (SEPSIS)
500.0000 mL | Freq: Once | INTRAVENOUS | Status: AC
  Administered 2016-01-18: 1000 mL via INTRAVENOUS

## 2016-01-18 MED ORDER — ACETAMINOPHEN 325 MG PO TABS: 650.0000 mg | ORAL_TABLET | Freq: Four times a day (QID) | ORAL | Status: DC | PRN

## 2016-01-18 MED ORDER — SODIUM CHLORIDE 0.9 % IV SOLN
INTRAVENOUS | Status: AC
  Administered 2016-01-19: via INTRAVENOUS

## 2016-01-18 MED ORDER — DUTASTERIDE 0.5 MG PO CAPS
0.5000 mg | ORAL_CAPSULE | Freq: Every day | ORAL | Status: DC
  Administered 2016-01-19 – 2016-01-20 (×2): 0.5 mg via ORAL
  Filled 2016-01-18 (×2): qty 1

## 2016-01-18 MED ORDER — SALINE SPRAY 0.65 % NA SOLN
1.0000 | Freq: Every evening | NASAL | Status: DC | PRN
  Filled 2016-01-18 (×2): qty 44

## 2016-01-18 MED ORDER — HEPARIN SODIUM (PORCINE) 5000 UNIT/ML IJ SOLN
5000.0000 [IU] | Freq: Three times a day (TID) | INTRAMUSCULAR | Status: DC
  Administered 2016-01-19 – 2016-01-20 (×5): 5000 [IU] via SUBCUTANEOUS
  Filled 2016-01-18 (×5): qty 1

## 2016-01-18 MED ORDER — FLUTICASONE PROPIONATE 50 MCG/ACT NA SUSP
1.0000 | Freq: Every day | NASAL | Status: DC | PRN
Start: 2016-01-18 — End: 2016-01-20
  Filled 2016-01-18: qty 16

## 2016-01-18 MED ORDER — ACETAMINOPHEN 650 MG RE SUPP: 650.0000 mg | Freq: Four times a day (QID) | RECTAL | Status: DC | PRN

## 2016-01-18 MED ORDER — PROSIGHT PO TABS
1.0000 | ORAL_TABLET | Freq: Every day | ORAL | Status: DC
  Administered 2016-01-19 – 2016-01-20 (×2): 1 via ORAL
  Filled 2016-01-18 (×2): qty 1

## 2016-01-18 MED ORDER — SODIUM CHLORIDE 0.9% FLUSH
3.0000 mL | Freq: Two times a day (BID) | INTRAVENOUS | Status: DC
  Administered 2016-01-19 – 2016-01-20 (×4): 3 mL via INTRAVENOUS

## 2016-01-18 NOTE — H&P (Signed)
History and Physical    Kevin Page [id number] DOB: March 19, 1931 DOA: 01/18/2016  PCP: Marton Redwood, MD   Patient coming from: Home   Chief Complaint: Syncope   HPI: Kevin Page is a 82 y.o. male with medical history significant for hypertension, hyperlipidemia, BPH, and history of syncope in 2014 he presents to the emergency department for evaluation of a syncopal episode. Patient reports being in his usual state of health today when he went to church. While there, he began to feel weak and sat down. The next thing he recalls is EMS arriving to assess him. There was a preceding weakness, but no chest pain or palpitations, and no headache or focal numbness or weakness. Patient denies any lower extremity edema, recent surgery or long distance travel, cough, or hemoptysis. He reports having little to eat today prior to the episode, but his wife at the bedside states that he seemed to eat his normal amount. Patient had a very similar episode in 2014 and was evaluated with a largely unremarkable echocardiogram at that time. He was also evaluated with Holter monitor which demonstrated a 12-beat run of atrial fibrillation and some bradycardia with average rate in the 50s. There had been no recurrence of syncope or near syncope until today.   ED Course: Upon arrival to the ED, patient is found to be afebrile, saturating well on room air, bradycardic to the high 40s, and with normal blood pressure. EKG features a sinus bradycardia with rate 49 and is otherwise normal. Chest x-ray is negative for acute cardiopulmonary disease. Chemistry panel features a serum creatinine 1.20, up from 1.0 in 2014. CBC is unremarkable and troponin is undetectable. Orthostatic vital signs were obtained and there was an 18 mmHg drop in systolic pressures from laying to standing, along with a concomitant 20 2B per minute rise in heart rate. Patient was given a 500 cc normal saline bolus in the ED, remained hemodynamically stable,  and will be observed on the telemetry unit for ongoing evaluation and management of syncopal episode.   Review of Systems:  All other systems reviewed and apart from HPI, are negative.  Past Medical History:  Diagnosis Date  . Allergy    dust and mites  . BPH (benign prostatic hyperplasia) 2/10  . Hyperlipidemia   . Macular degeneration   . Osteoarthritis of both knees   . Unspecified essential hypertension     Past Surgical History:  Procedure Laterality Date  . CATARACT EXTRACTION W/ INTRAOCULAR LENS  IMPLANT, BILATERAL Bilateral 2011  . dupytron contractions  1998 , 2006   both hands  . LUMBAR FUSION  1994     reports that he has quit smoking. He has never used smokeless tobacco. He reports that he drinks about 6.0 oz of alcohol per week . He reports that he does not use drugs.  No Known Allergies  Family History  Problem Relation Age of Onset  . Cancer Brother     tonsils     Prior to Admission medications   Medication Sig Start Date End Date Taking? Authorizing Provider  alendronate (FOSAMAX) 70 MG tablet Take 70 mg by mouth every Thursday. Take with a full glass of water on an empty stomach.   Yes Historical Provider, MD  aspirin EC 81 MG tablet Take 81 mg by mouth daily.   Yes Historical Provider, MD  brimonidine (ALPHAGAN) 0.2 % ophthalmic solution Place 1 drop into both eyes at bedtime. 11/14/15  Yes Historical Provider, MD  dutasteride (AVODART) 0.5  MG capsule Take 0.5 mg by mouth daily.   Yes Historical Provider, MD  fluticasone (FLONASE) 50 MCG/ACT nasal spray Place 1 spray into both nostrils daily as needed for allergies or rhinitis.   Yes Historical Provider, MD  lisinopril (PRINIVIL,ZESTRIL) 10 MG tablet Take 10 mg by mouth daily.   Yes Historical Provider, MD  Multiple Vitamins-Minerals ([place] PO) Take 2 tablets by mouth daily.   Yes Historical Provider, MD  simvastatin (ZOCOR) 20 MG tablet Take 20 mg by mouth Daily.  10/19/11  Yes Historical  Provider, MD  sodium chloride (OCEAN) 0.65 % SOLN nasal spray Place 1 spray into both nostrils at bedtime as needed for congestion.   Yes Historical Provider, MD  Tamsulosin HCl (FLOMAX) 0.4 MG CAPS Take 0.4 mg by mouth Daily.  09/22/11  Yes Historical Provider, MD    Physical Exam: Vitals:   01/18/16 2015 01/18/16 2100 01/18/16 2130 01/18/16 2200  BP: 112/76 155/70 148/58 164/74  Pulse: (!) 57 (!) 56 63 62  Resp: 16 17 17 16   Temp:      TempSrc:      SpO2: 100% 96% 95% 95%  Weight:      Height:          Constitutional: NAD, calm, comfortable Eyes: PERTLA, lids and conjunctivae normal ENMT: Mucous membranes are moist. Posterior pharynx clear of any exudate or lesions.   Neck: normal, supple, no masses, no thyromegaly Respiratory: clear to auscultation bilaterally, no wheezing, no crackles. Normal respiratory effort.   Cardiovascular: S1 & S2 heard, regular rate and rhythm, soft systolic murmur at apex. No extremity edema. No carotid bruits. No significant JVD. Abdomen: No distension, no tenderness, no masses palpated. Bowel sounds normal.  Musculoskeletal: no clubbing / cyanosis. No joint deformity upper and lower extremities. Normal muscle tone.  Skin: no significant rashes, lesions, ulcers. Warm, dry, well-perfused. Neurologic: CN 2-12 grossly intact. Sensation intact, DTR normal. Strength 5/5 in all 4 limbs.  Psychiatric: Normal judgment and insight. Alert and oriented x 3. Normal mood and affect.     Labs on Admission: I have personally reviewed following labs and imaging studies  CBC:  Recent Labs Lab 01/18/16 1901  WBC 7.0  HGB 13.1  HCT 41.1  MCV 97.2  PLT [id number]   Basic Metabolic Panel:  Recent Labs Lab 01/18/16 1901  NA 135  K 4.3  CL 100*  CO2 27  GLUCOSE 117*  BUN 17  CREATININE 1.20  CALCIUM 9.0   GFR: Estimated Creatinine Clearance: 45 mL/min (by C-G formula based on SCr of 1.2 mg/dL). Liver Function Tests: No results for input(s): AST, ALT,  ALKPHOS, BILITOT, PROT, ALBUMIN in the last 168 hours. No results for input(s): LIPASE, AMYLASE in the last 168 hours. No results for input(s): AMMONIA in the last 168 hours. Coagulation Profile: No results for input(s): INR, PROTIME in the last 168 hours. Cardiac Enzymes: No results for input(s): CKTOTAL, CKMB, CKMBINDEX, TROPONINI in the last 168 hours. BNP (last 3 results) No results for input(s): PROBNP in the last 8760 hours. HbA1C: No results for input(s): HGBA1C in the last 72 hours. CBG: No results for input(s): GLUCAP in the last 168 hours. Lipid Profile: No results for input(s): CHOL, HDL, LDLCALC, TRIG, CHOLHDL, LDLDIRECT in the last 72 hours. Thyroid Function Tests: No results for input(s): TSH, T4TOTAL, FREET4, T3FREE, THYROIDAB in the last 72 hours. Anemia Panel: No results for input(s): VITAMINB12, FOLATE, FERRITIN, TIBC, IRON, RETICCTPCT in the last 72 hours. Urine analysis:  Component Value Date/Time   COLORURINE YELLOW 01/11/2013 2244   APPEARANCEUR CLOUDY (A) 01/11/2013 2244   LABSPEC 1.030 01/11/2013 2244   PHURINE 5.0 01/11/2013 2244   GLUCOSEU NEGATIVE 01/11/2013 2244   HGBUR NEGATIVE 01/11/2013 2244   BILIRUBINUR NEGATIVE 01/11/2013 2244   KETONESUR NEGATIVE 01/11/2013 2244   PROTEINUR NEGATIVE 01/11/2013 2244   UROBILINOGEN 0.2 01/11/2013 2244   NITRITE NEGATIVE 01/11/2013 2244   LEUKOCYTESUR NEGATIVE 01/11/2013 2244   Sepsis Labs: @LABRCNTIP (procalcitonin:4,lacticidven:4) )No results found for this or any previous visit (from the past 240 hour(s)).   Radiological Exams on Admission: Dg Chest 2 View  Result Date: 01/18/2016 CLINICAL DATA:  Syncope and bradycardia. EXAM: CHEST  2 VIEW COMPARISON:  01/11/2013 and prior chest radiographs FINDINGS: Mild cardiomegaly noted. Left hemidiaphragm elevation and left basilar scarring again noted. There is no evidence of focal airspace disease, pulmonary edema, suspicious pulmonary nodule/mass, pleural effusion,  or pneumothorax. No acute bony abnormalities are identified. IMPRESSION: No evidence of acute cardiopulmonary disease. Electronically Signed   By: Margarette Canada M.D.   On: 01/18/2016 19:35    EKG: Independently reviewed. Sinus bradycardia (rate 49)   Assessment/Plan  1. Syncope  - Most likely orthostatic; he reported decreased PO intake and there was 18 mmHg drop in SBP upon standing  - Cardiac etiology is also considered given his bradycardia  - Aside from bradycardia, EKG and telemetry monitoring without arrhythmia; troponin 0.00  - Will monitor on telemetry overnight, provide a gentle IVF hydration, check TTE for structural cardiac etiology   - Hold Flomax for now given concern for orthostasis  2. Sinus bradycardia  - Asymptomatic with HR in 40's  - Wore Holter monitor after syncopal episode in 2014 and had avg HR in 50's and one 12-beat run of atrial fibrillation  - Monitor on telemetry overnight for arrhythmia as possible etiology of syncopal episode   3. Hypertension  - At goal currently  - Managed at home with lisinopril; will hold this for now  - Monitor and treat prn for now   4. Hyperlipidemia  - Continue Zocor    5. BPH  - Managed with Avodart and Flomax  - Flomax will be held for now given concern for orthostatic hypotension     DVT prophylaxis: sq heparin Code Status: Full  Family Communication: Wife updated at bedside  Disposition Plan: Observe on telemetry Consults called: None Admission status: Observation    Vianne Bulls, MD [facility] Pager [phone]  If 7PM-7AM, please contact night-coverage [email or web address] Password [facility]  01/18/2016, 10:52 PM

## 2016-01-18 NOTE — ED Provider Notes (Signed)
Medical screening examination/treatment/procedure(s) were conducted as a shared visit with non-physician practitioner(s) and myself.  I personally evaluated the patient during the encounter.   EKG Interpretation  Date/Time:  Saturday January 18 2016 18:30:57 EDT Ventricular Rate:  49 PR Interval:  166 QRS Duration: 84 QT Interval:  454 QTC Calculation: 410 R Axis:   43 Text Interpretation:  Sinus bradycardia Otherwise normal ECG No significant change was found Confirmed by Wyvonnia Dusky  MD, Madelaine Whipple [phone]) on 01/18/2016 6:48:25 PM        HPI Comments: Kevin Page is a 82 y.o. male who presents to the Emergency Department complaining of an unwitnessed episode of syncope that occurred earlier this evening. 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 Pt is unable to specify how long he was unconscious. He denies CP, HA, abdominal pain, blurred vision, diaphoresis, nausea or SOB prior to syncope. He also denies fall or head injury. Per wife, he was transiently diaphoretic after he regained consciousness. Pt feels that he feels he is currently at his baseline and denies any current lightheadedness. He states he ate and drank normally today and felt otherwise well today. He is ambulatory without difficulty. No recent changes in medications. He reports prior episodes of similar syncope, for one of which he was admitted to the ED. Pt states some of these episodes were a result of forgetting to take his medication. No h/o cardiac disease. Pt does not take beta-blockers. He also denies recent melena, weakness, neck pain, abdominal pain, emesis, diarrhea, nausea, orthostatic dizziness.    Physical Examination: Heart: Regular rhythm; Bradycardic in the 50's, no murmurs; Radial pulses intact and equal  Lungs: Lungs CTA bilaterally.  Neuro: Cranial nerves 2-12 intact. 5/5 strength throughout, no ataxia on finger to nose. Romberg neg.  Normal gait. No nystagmus.    Orthostatic VS for the past 24 hrs:  BP- Lying Pulse- Lying BP- Sitting Pulse- Sitting BP- Standing at 0 minutes Pulse- Standing at 0 minutes  01/18/16 2005 137/71 56 132/82 60 119/80 78   Syncopal episode. Positive orthostatics. EKG without Brugada or prolonged QT. He does have sinus bradycardia. Plan observation given age and low heart rate.  By signing my name below, I, Hansel Feinstein, attest that this documentation has been prepared under the direction and in the presence of Ezequiel Essex, MD. Electronically Signed: Hansel Feinstein, Scribe. 01/18/16. 8:18 PM.   I personally performed the services described in this documentation, which was scribed in my presence. The recorded information has been reviewed and is accurate.    Ezequiel Essex, MD 01/19/16 [phone]

## 2016-01-18 NOTE — ED Provider Notes (Signed)
[facility] DEPT Provider Note   CSN: [id number] Arrival date & time: 01/18/16  [id number]     History   Chief Complaint Chief Complaint  Patient presents with  . Near Syncope    HPI Kevin Page is a 82 y.o. male who presents with syncope. PMH significant for HTN/HLD, macular degeneration, previous hx of syncope. 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 He started to feel lightheaded and went to sit down. He regained consciousness and was on the floor lying down. He is unsure of the amount of time he passed out and unsure if he hit his head. Currently he feels back to baseline and denies pain. Denies fever, chills, headache, acute vision changes (has macular degeneration), neck pain, chest pain, palpitations, SOB, abdominal pain, N/V, melena/hematochezia, unilateral weakness, paresthesias. Denies changes in medicines or decreased PO intake. He is not on blood thinners. Was admitted for syncope as obs in 2014 due to age even though it was thought to be vasovagal/orthostatic. EF 55-60% in 2014.  HPI  Past Medical History:  Diagnosis Date  . Allergy    dust and mites  . BPH (benign prostatic hyperplasia) 2/10  . Hyperlipidemia   . Macular degeneration   . Osteoarthritis of both knees   . Unspecified essential hypertension     Patient Active Problem List   Diagnosis Date Noted  . Syncope 01/12/2013  . Hypertension 01/12/2013  . Hyperlipidemia 01/12/2013  . BPH (benign prostatic hyperplasia) 01/12/2013    Past Surgical History:  Procedure Laterality Date  . CATARACT EXTRACTION W/ INTRAOCULAR LENS  IMPLANT, BILATERAL Bilateral 2011  . dupytron contractions  1998 , 2006   both hands  . LUMBAR FUSION  1994       Home Medications    Prior to Admission medications   Medication Sig Start Date End Date Taking? Authorizing Provider  alendronate (FOSAMAX) 70 MG tablet TAKE 1 TABLET BY MOUTH ON AN EMPTY STOMACH WITH GLASS OF WATER ONCE A WEEK 09/06/15    Historical Provider, MD  aspirin EC 81 MG tablet Take 81 mg by mouth daily.    Historical Provider, MD  Azelastine-Fluticasone 137-50 MCG/ACT SUSP Place 1 spray into the nose as needed (congestion).     Historical Provider, MD  DM-Doxylamine-Acetaminophen 15-6.25-325 MG CAPS Take 2 capsules by mouth as needed.    Historical Provider, MD  dutasteride (AVODART) 0.5 MG capsule Take 0.5 mg by mouth daily.    Historical Provider, MD  lisinopril (PRINIVIL,ZESTRIL) 10 MG tablet Take 10 mg by mouth daily.    Historical Provider, MD  Multiple Vitamins-Minerals ([place] PO) Take 2 tablets by mouth daily.    Historical Provider, MD  simvastatin (ZOCOR) 20 MG tablet Take 1 tablet by mouth Daily. 10/19/11   Historical Provider, MD  Tamsulosin HCl (FLOMAX) 0.4 MG CAPS Take 1 tablet by mouth Daily. 09/22/11   Historical Provider, MD  UNABLE TO [place], takes 2 tablets a day    Historical Provider, MD  UNABLE TO FIND Pt takes [place]; puts one drop in each eye every day    Historical Provider, MD    Family History Family History  Problem Relation Age of Onset  . Cancer Brother     tonsils    Social History Social History  Substance Use Topics  . Smoking status: Former Research scientist (life sciences)  . Smokeless tobacco: Never Used  . Alcohol use 6.0 oz/week    12 drink(s) per week  Allergies   Review of patient's allergies indicates no known allergies.   Review of Systems Review of Systems  Constitutional: Negative for appetite change, chills and fever.  HENT: Positive for congestion.        Chronic nasal congestion  Eyes: Negative for visual disturbance.  Respiratory: Negative for cough and shortness of breath.   Cardiovascular: Negative for chest pain and palpitations.  Gastrointestinal: Negative for abdominal pain, blood in stool, nausea and vomiting.  Genitourinary: Negative for dysuria and flank pain.  Musculoskeletal: Negative for back pain, gait problem and neck pain.    Neurological: Positive for syncope and light-headedness. Negative for dizziness, seizures, weakness, numbness and headaches.  All other systems reviewed and are negative.    Physical Exam Updated Vital Signs BP (!) 125/108 (BP Location: Right Arm)   Pulse (!) 49   Temp 98.1 F (36.7 C) (Oral)   Resp 14   Ht 5\' 11"  (1.803 m)   Wt 67.1 kg   SpO2 99%   BMI 20.64 kg/m   Physical Exam  Constitutional: He is oriented to person, place, and time. He appears well-developed and well-nourished. No distress.  Pleasant calm, cooperative elderly male in NAD  HENT:  Head: Normocephalic and atraumatic.  Eyes: Conjunctivae are normal. Pupils are equal, round, and reactive to light. Right eye exhibits no discharge. Left eye exhibits no discharge. No scleral icterus.  Neck: Normal range of motion. Neck supple.  No midline spinal tenderness  Cardiovascular: Regular rhythm.  Bradycardia present.  Exam reveals no gallop and no friction rub.   No murmur heard. Pulmonary/Chest: Effort normal and breath sounds normal. No respiratory distress. He has no wheezes. He has no rales. He exhibits no tenderness.  Abdominal: Soft. He exhibits no distension and no mass. There is no tenderness. There is no rebound and no guarding. No hernia.  Musculoskeletal: He exhibits no edema.  Neurological: He is alert and oriented to person, place, and time.  Mental Status:  Alert, oriented, thought content appropriate, able to give a coherent history. Speech fluent without evidence of aphasia. Able to follow 2 step commands without difficulty.  Cranial Nerves:  II:  Peripheral visual fields grossly normal, pupils equal, round, reactive to light III,IV, VI: ptosis not present, extra-ocular motions intact bilaterally  V,VII: smile symmetric, facial light touch sensation equal VIII: hearing grossly normal to voice  X: uvula elevates symmetrically  XI: bilateral shoulder shrug symmetric and strong XII: midline tongue  extension without fassiculations Motor:  Normal tone. 5/5 in upper and lower extremities bilaterally including strong and equal grip strength and dorsiflexion/plantar flexion Sensory: Pinprick and light touch normal in all extremities.  Deep Tendon Reflexes: 2+ and symmetric in the biceps and patella Cerebellar: normal finger-to-nose with bilateral upper extremities Gait: normal gait and balance CV: distal pulses palpable throughout    Skin: Skin is warm and dry.  Psychiatric: He has a normal mood and affect.  Nursing note and vitals reviewed.    ED Treatments / Results  Labs (all labs ordered are listed, but only abnormal results are displayed) Labs Reviewed  BASIC METABOLIC PANEL - Abnormal; Notable for the following:       Result Value   Chloride 100 (*)    Glucose, Bld 117 (*)    GFR calc non Af Amer 54 (*)    All other components within normal limits  CBC  TROPONIN I  TROPONIN I  TROPONIN I  BASIC METABOLIC PANEL  I-STAT TROPOININ, ED  EKG  EKG Interpretation  Date/Time:  Saturday January 18 2016 18:30:57 EDT Ventricular Rate:  49 PR Interval:  166 QRS Duration: 84 QT Interval:  454 QTC Calculation: 410 R Axis:   43 Text Interpretation:  Sinus bradycardia Otherwise normal ECG No significant change was found Confirmed by Wyvonnia Dusky  MD, STEPHEN [phone]) on 01/18/2016 6:48:25 PM       Radiology Dg Chest 2 View  Result Date: 01/18/2016 CLINICAL DATA:  Syncope and bradycardia. EXAM: CHEST  2 VIEW COMPARISON:  01/11/2013 and prior chest radiographs FINDINGS: Mild cardiomegaly noted. Left hemidiaphragm elevation and left basilar scarring again noted. There is no evidence of focal airspace disease, pulmonary edema, suspicious pulmonary nodule/mass, pleural effusion, or pneumothorax. No acute bony abnormalities are identified. IMPRESSION: No evidence of acute cardiopulmonary disease. Electronically Signed   By: Margarette Canada M.D.   On: 01/18/2016 19:35     Procedures Procedures (including critical care time)  Medications Ordered in ED Medications  brimonidine (ALPHAGAN) 0.2 % ophthalmic solution 1 drop (not administered)  fluticasone (FLONASE) 50 MCG/ACT nasal spray 1 spray (not administered)  sodium chloride (OCEAN) 0.65 % nasal spray 1 spray (not administered)  aspirin EC tablet 81 mg (not administered)  dutasteride (AVODART) capsule 0.5 mg (not administered)  multivitamin (PROSIGHT) tablet 1 tablet (not administered)  simvastatin (ZOCOR) tablet 20 mg (not administered)  sodium chloride flush (NS) 0.9 % injection 3 mL (not administered)  heparin injection 5,000 Units (not administered)  0.9 %  sodium chloride infusion (not administered)  acetaminophen (TYLENOL) tablet 650 mg (not administered)    Or  acetaminophen (TYLENOL) suppository 650 mg (not administered)  HYDROcodone-acetaminophen (NORCO/VICODIN) 5-325 MG per tablet 1-2 tablet (not administered)  sodium chloride 0.9 % bolus 500 mL (0 mLs Intravenous Stopped 01/18/16 2129)     Initial Impression / Assessment and Plan / ED Course  I have reviewed the triage vital signs and the nursing notes.  Pertinent labs & imaging results that were available during my care of the patient were reviewed by me and considered in my medical decision making (see chart for details).  Clinical Course   81 year old male presents with syncope. He does have positive orthostatics however last time he was admitted for syncope he had holter monitoring and several runs of SVT and was diagnosed with possible sick sinus syndrome but it was determined that he didn't qualify for a pacemaker. Vital signs in the ED reveal he was initially bradycardic. 500cc IVF bolus given.   CBC is unremarkable. CMP overall unremarkable. EKG shows sinus bradycardia. CXR is negative. Patient is denying all symptoms however due to his history will have him admitted for observation overnight. Spoke with Dr. Myna Hidalgo who will  admit.  Final Clinical Impressions(s) / ED Diagnoses   Final diagnoses:  Syncope, unspecified syncope type    New Prescriptions New Prescriptions   No medications on file     Recardo Evangelist, PA-C 01/18/16 [place], MD 01/19/16 [phone]

## 2016-01-18 NOTE — ED Notes (Signed)
Patient transported to X-ray 

## 2016-01-18 NOTE — ED Triage Notes (Signed)
Pt was at church today and started feeling weak and sat down. sts the next thing he remembers is EMS arriving. sts brady 40-50. Denies any pain. sts he feels tired. Denies chest pain, SOB. sts slight diaphoresis but now dry dry. CBG 83. 20 left ac. BP 127/75. Pulse 50 o2 100 %.

## 2016-01-19 ENCOUNTER — Encounter (HOSPITAL_COMMUNITY): Payer: Self-pay | Admitting: Physician Assistant

## 2016-01-19 DIAGNOSIS — I351 Nonrheumatic aortic (valve) insufficiency: Secondary | ICD-10-CM

## 2016-01-19 DIAGNOSIS — I77810 Thoracic aortic ectasia: Secondary | ICD-10-CM

## 2016-01-19 DIAGNOSIS — I10 Essential (primary) hypertension: Secondary | ICD-10-CM | POA: Diagnosis not present

## 2016-01-19 DIAGNOSIS — N400 Enlarged prostate without lower urinary tract symptoms: Secondary | ICD-10-CM

## 2016-01-19 DIAGNOSIS — I951 Orthostatic hypotension: Secondary | ICD-10-CM

## 2016-01-19 DIAGNOSIS — R55 Syncope and collapse: Secondary | ICD-10-CM | POA: Diagnosis not present

## 2016-01-19 DIAGNOSIS — E785 Hyperlipidemia, unspecified: Secondary | ICD-10-CM | POA: Diagnosis not present

## 2016-01-19 DIAGNOSIS — R001 Bradycardia, unspecified: Secondary | ICD-10-CM | POA: Diagnosis not present

## 2016-01-19 DIAGNOSIS — I471 Supraventricular tachycardia: Secondary | ICD-10-CM | POA: Insufficient documentation

## 2016-01-19 LAB — BASIC METABOLIC PANEL
ANION GAP: 4 — AB (ref 5–15)
BUN: 15 mg/dL (ref 6–20)
CALCIUM: 8.2 mg/dL — AB (ref 8.9–10.3)
CO2: 28 mmol/L (ref 22–32)
CREATININE: 1.01 mg/dL (ref 0.61–1.24)
Chloride: 103 mmol/L (ref 101–111)
GFR calc non Af Amer: >60 mL/min (ref >60)
Glucose, Bld: 87 mg/dL (ref 65–99)
Potassium: 3.9 mmol/L (ref 3.5–5.1)
SODIUM: 135 mmol/L (ref 135–145)

## 2016-01-19 LAB — TROPONIN I: Troponin I: <0.03 ng/mL (ref <0.03)

## 2016-01-19 LAB — GLUCOSE, CAPILLARY: GLUCOSE-CAPILLARY: 81 mg/dL (ref 65–99)

## 2016-01-19 LAB — TSH: TSH: 1.18 u[IU]/mL (ref 0.350–4.500)

## 2016-01-19 MED ORDER — LISINOPRIL 5 MG PO TABS
5.0000 mg | ORAL_TABLET | Freq: Every day | ORAL | Status: DC
  Administered 2016-01-19 – 2016-01-20 (×2): 5 mg via ORAL
  Filled 2016-01-19 (×2): qty 1

## 2016-01-19 NOTE — Progress Notes (Signed)
Pt refused bed alarm. Will continue to do hourly rounding.

## 2016-01-19 NOTE — Consult Note (Signed)
Cardiology Consultation Note    Patient ID: Jahmiere Hearn, MRN: [id number], DOB/AGE: Apr 10, 1931 82 y.o. Admit date: 01/18/2016   Date of Consult: 01/19/2016 Primary Physician: Marton Redwood, MD Primary Cardiologist: Meda Coffee  Chief Complaint: passed out Reason for Consultation: syncope, bradycardia Requesting MD: Dr. Myna Hidalgo  HPI:  Mr. Dudziak is an 82 y/o M with history of HTN, HLD, BPH, OA, macular degeneration, habitual alcohol intake (1 drink of gin daily), former tobacco abuse (44 yrs, quit 1990s), syncope 2014 (event monitor with SB 40s-50s, brief SVT), mild AI and mildly dilated aortic root in 2014 whom we are asked to see for syncope. This is his second episode since 2014.  He had an episode of syncope in 12/2012 following a stressful day where he hadn't had a lot of fluids. 2D echo 01/12/13: EF 55-60%, grade 1 DD, mild AI, mildly dilated aortic root, mild MR, mild-mod dilated. 48 hour Holter showed episodes of sinus bradycardia with longest pause 1.8 seconds, also with a few episodes of SVT, the longest run lasting 12 beats with rate 128 BPM -> he did have recordings of HR in the 40s at that time. This was felt possibly indicative of SSS, although there was no indication for PPM at that time. ETT 03/2013: fair exercise capacity, no ischemic changes, normal BP response, demonstrated chronotropic competence.  He was in his [employer] yesterday except had a "tired" day. He had eaten some shrimp and had some Coke for lunch - he thinks maybe he should've had more. 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 He excused himself to another room where he sat down. That's the last thing he remembers. He awoke to EMS, his wife, and the priest surrounding him. He denies any B/B incontinence. He was alone for part of the syncope so it's not clear how long he was fully out for. He has amnesia from the time of sitting down until EMS arrived. Denies any CP, SOB, palpitations,  vomiting. Has macular degeneration so sight is his major issue, but no acute changes. Not very active at home - no change in chronic dyspnea. No recent CP otherwise. Denies any sx now. Denies h/o dizziness upon standing. Workup thus far showed an 28mmHg drop in BP in ED with 20 beat rise in HR, suggestive of orthostasis. He has been treated with IVF. Labs notable for neg trop x 3, normal CBC, glucose 117, BUN/Cr 17/1.20. 2D echo pending.  Telemetry thus far has shown HR generally in the upper 40s, low 50s, with one episode of HR 44 this AM. He has not had any cardiac symptoms since admission.  Past Medical History:  Diagnosis Date  . Allergy    dust and mites  . BPH (benign prostatic hyperplasia)   . Essential hypertension   . Hyperlipidemia   . Macular degeneration   . Osteoarthritis of both knees   . Sinus bradycardia   . SVT (supraventricular tachycardia) ([place])    a. Event monitor 2014: few episodes of SVT, longest 12 beats with rate 128.  [name] Syncope       Surgical History:  Past Surgical History:  Procedure Laterality Date  . CATARACT EXTRACTION W/ INTRAOCULAR LENS  IMPLANT, BILATERAL Bilateral 2011  . dupytron contractions  1998 , 2006   both hands  . LUMBAR FUSION  1994     Home Meds: Prior to Admission medications   Medication Sig Start Date End Date Taking? Authorizing Provider  alendronate (  FOSAMAX) 70 MG tablet Take 70 mg by mouth every Thursday. Take with a full glass of water on an empty stomach.   Yes Historical Provider, MD  aspirin EC 81 MG tablet Take 81 mg by mouth daily.   Yes Historical Provider, MD  brimonidine (ALPHAGAN) 0.2 % ophthalmic solution Place 1 drop into both eyes at bedtime. 11/14/15  Yes Historical Provider, MD  dutasteride (AVODART) 0.5 MG capsule Take 0.5 mg by mouth daily.   Yes Historical Provider, MD  fluticasone (FLONASE) 50 MCG/ACT nasal spray Place 1 spray into both nostrils daily as needed for allergies or rhinitis.   Yes Historical Provider,  MD  lisinopril (PRINIVIL,ZESTRIL) 10 MG tablet Take 10 mg by mouth daily.   Yes Historical Provider, MD  Multiple Vitamins-Minerals ([place] PO) Take 2 tablets by mouth daily.   Yes Historical Provider, MD  simvastatin (ZOCOR) 20 MG tablet Take 20 mg by mouth Daily.  10/19/11  Yes Historical Provider, MD  sodium chloride (OCEAN) 0.65 % SOLN nasal spray Place 1 spray into both nostrils at bedtime as needed for congestion.   Yes Historical Provider, MD  Tamsulosin HCl (FLOMAX) 0.4 MG CAPS Take 0.4 mg by mouth Daily.  09/22/11  Yes Historical Provider, MD    Inpatient Medications:  . aspirin EC  81 mg Oral Daily  . brimonidine  1 drop Both Eyes QHS  . dutasteride  0.5 mg Oral Daily  . heparin  5,000 Units Subcutaneous Q8H  . multivitamin  1 tablet Oral Daily  . simvastatin  20 mg Oral q1800  . sodium chloride flush  3 mL Intravenous Q12H   . sodium chloride 100 mL/hr at 01/19/16 0003    Allergies: No Known Allergies  Social History   Social History  . Marital status: Married    Spouse name: N/A  . Number of children: N/A  . Years of education: N/A   Occupational History  . Not on file.   Social History Main Topics  . Smoking status: Former Research scientist (life sciences)  . Smokeless tobacco: Never Used     Comment: Smoked for 44 years, quit 1990s  . Alcohol use 6.0 oz/week    12 Standard drinks or equivalent per week     Comment: 1 drink of gin nightly, occasionally more if going out to dinner.  . Drug use: No  . Sexual activity: Not on file   Other Topics Concern  . Not on file   Social History Narrative  . No narrative on file     Family History  Problem Relation Age of Onset  . Cancer Brother     tonsils     Review of Systems: All other systems reviewed and are otherwise negative except as noted above.  Labs:  Recent Labs  01/18/16 2305 01/19/16 0435  TROPONINI <0.03 <0.03   Lab Results  Component Value Date   WBC 7.0 01/18/2016   HGB 13.1 01/18/2016   HCT 41.1  01/18/2016   MCV 97.2 01/18/2016   PLT 192 01/18/2016    Recent Labs Lab 01/19/16 0435  NA 135  K 3.9  CL 103  CO2 28  BUN 15  CREATININE 1.01  CALCIUM 8.2*  GLUCOSE 87   No results found for: CHOL, HDL, LDLCALC, TRIG No results found for: DDIMER  Radiology/Studies:  Dg Chest 2 View  Result Date: 01/18/2016 CLINICAL DATA:  Syncope and bradycardia. EXAM: CHEST  2 VIEW COMPARISON:  01/11/2013 and prior chest radiographs FINDINGS: Mild cardiomegaly noted. Left  hemidiaphragm elevation and left basilar scarring again noted. There is no evidence of focal airspace disease, pulmonary edema, suspicious pulmonary nodule/mass, pleural effusion, or pneumothorax. No acute bony abnormalities are identified. IMPRESSION: No evidence of acute cardiopulmonary disease. Electronically Signed   By: Margarette Canada M.D.   On: 01/18/2016 19:35    Wt Readings from Last 3 Encounters:  01/19/16 151 lb 1.6 oz (68.5 kg)  09/04/14 154 lb (69.9 kg)  03/23/13 155 lb (70.3 kg)    EKG: sinus bradycardia, 459bpm, no acute changes, PR 166bpm  Physical Exam: Blood pressure (!) 149/75, pulse (!) 54, temperature 98.2 F (36.8 C), temperature source Oral, resp. rate 17, height 5\' 11"  (1.803 m), weight 151 lb 1.6 oz (68.5 kg), SpO2 95 %. Body mass index is 21.07 kg/m. General: Well developed, well nourished WM, in no acute distress. Head: Normocephalic, atraumatic, sclera non-icteric, no xanthomas, nares are without discharge.  Neck: Negative for carotid bruits. JVD not elevated. Lungs: Clear bilaterally to auscultation without wheezes, rales, or rhonchi. Breathing is unlabored. Heart: RRR with S1 S2. No murmurs, rubs, or gallops appreciated. Abdomen: Soft, non-tender, non-distended with normoactive bowel sounds. No hepatomegaly. No rebound/guarding. No obvious abdominal masses. Msk:  Strength and tone appear normal for age. Extremities: No clubbing or cyanosis. No edema.  Distal pedal pulses are 2+ and equal  bilaterally. Neuro: Alert and oriented X 3. No facial asymmetry. No focal deficit. Moves all extremities spontaneously. Psych:  Responds to questions appropriately with a normal affect.    Assessment and Plan  82 y/o M with history of HTN, HLD, BPH, OA, macular degeneration, habitual alcohol intake (1 drink of gin daily), former tobacco abuse (44 yrs, quit 1990s), syncope 2014 (event monitor with SB 40s-50s, brief SVT), mild AI and mildly dilated aortic root in 2014 whom we are asked to see for syncope.  1. Syncope - etiology unclear. Possibly due to orthostasis. He does not recall anything from the time he sat down until EMS arrived - does not recall any details in the interim. Telemetry shows known HR in the 40s-50s c/w prior. Agree with 2D echocardiogram to reassess cardiac structure including prior AI and aortic root dilation. Will review further workup with cardiologist. His wife will be arriving this AM so we may be able to get more info from her.  2. Sinus bradycardia - check TSH, free T4. Avoid AVN blocking agents.  3. Orthostasis in the setting of history of HTN - lisinopril and Flomax on hold by primary team.  Signed, Charlie Pitter PA-C 01/19/2016, 8:22 AM Pager: [phone]    Attending note:  Patient seen and examined. Reviewed records and agree with assessment by Ms. Dunn PA-C. Mr. Savannah presents after an episode of syncope. 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 After he sat he had frank syncope, does not recall the details around that event. He does not report any preceding chest pain or palpitations. He had a similar event back in 2014 at which point he wore a 48 hour Holter monitor that showed sinus bradycardia with a pause of 1.8 seconds and few brief episodes of SVT, but nothing to clearly explain syncope. He also underwent GXT that did not show chronotropic incompetence. He reports stable  medical regimen, no recent changes. He is on lisinopril and Flomax. No recent diarrhea or other fluid losses. Does drink one or 2 ounces of gin daily. Orthostatic measurements  in the hospital were somewhat equivocal, he did have a drop in systolic from [id number] with increase in heart rate from 56-78. Cardiac monitoring so far shows sinus rhythm and sinus bradycardia with no pauses.  Examination he appears comfortable. No specific complaints other than nasal stuffiness which is of chronic problem. Systolic blood pressure currently in the 140s, heart rate in the 50s and sinus rhythm. Lungs are clear without labored breathing. Cardiac exam reveals RRR without gallop. Lab work shows BUN 15, creatinine 1.0, hemoglobin 13.1, TSH 1.18, normal troponin I levels. ECG shows sinus bradycardia.  Episode of syncope after standing for prolonged period of time. Mild orthostatic changes noted on measurement in the hospital. Also noted to be bradycardic, although not clear that this is symptom provoking, and so far no profound bradycardia or pauses identified. No evidence of ACS by cardiac enzymes. Could be that he has some degree of orthostatic intolerance exacerbated by Flomax and lisinopril, particularly if he had any degree of intravascular volume depletion or vasodilatation with alcohol use. If his heart rate tends to be on the slow side as well and does not pick up adequately when he stands, this may also be contributing. At this point however, there is no clear indication for pacemaker. Suggest monitoring during the day, he has been hydrated, have him ambulate. Echocardiogram has been ordered to exclude any substantial cardiac structural or valvular abnormalities. May want to consider reducing dose of Flomax (if able to tolerate with BPH) and possibly even lisinopril. If no significant problems uncovered during observation, potentially consider discharge home later this evening or tomorrow for close follow-up with his PCP.  A longer course of outpatient cardiac monitoring may also be reasonable such as a 14 day event recorder. This can be arranged through our office with Dr. Meda Coffee.  Satira Sark, M.D., F.A.C.C.

## 2016-01-19 NOTE — Progress Notes (Signed)
PROGRESS NOTE                                                                                                                                                                                                             Patient Demographics:    Kevin Page, is a 82 y.o. male, DOB - 10-19-1930, [id number]  Admit date - 01/18/2016   Admitting Physician Kevin Bulls, MD  Outpatient Primary MD for the patient is Kevin Redwood, MD  LOS - 0  Chief Complaint  Patient presents with  . Near Syncope       Brief Narrative   Kevin Page is a 82 y.o. male with medical history significant for hypertension, hyperlipidemia, BPH, and history of syncope in 2014 he presents to the emergency department for evaluation of a syncopal episode. Patient reports being in his usual state of health today when he went to church. While there, he began to feel weak and sat down. The next thing he recalls is EMS arriving to assess him. There was a preceding weakness, but no chest pain or palpitations, and no headache or focal numbness or weakness. Patient denies any lower extremity edema, recent surgery or long distance travel, cough, or hemoptysis. He reports having little to eat today prior to the episode, but his wife at the bedside states that he seemed to eat his normal amount. Patient had a very similar episode in 2014 and was evaluated with a largely unremarkable echocardiogram at that time. He was also evaluated with Holter monitor which demonstrated a 12-beat run of atrial fibrillation and some bradycardia with average rate in the 50s. There had been no recurrence of syncope or near syncope until today.  ED Course: Upon arrival to the ED, patient is found to be afebrile, saturating well on room air, bradycardic to the high 40s, and with normal blood pressure. EKG features a sinus bradycardia with rate 49 and is otherwise normal. Chest x-ray is  negative for acute cardiopulmonary disease. Chemistry panel features a serum creatinine 1.20, up from 1.0 in 2014. CBC is unremarkable and troponin is undetectable. Orthostatic vital signs were obtained and there was an 18 mmHg drop in systolic pressures from laying to standing, along with a concomitant 20 2B per minute rise in heart rate. Patient was given a 500 cc normal saline bolus in the  ED, remained hemodynamically stable, and will be observed on the telemetry unit for ongoing evaluation and management of syncopal episode.     Subjective:    [name] today has, No headache, No chest pain, No abdominal pain - No Nausea, No new weakness tingling or numbness, No Cough - SOB.     Assessment  & Plan :     1. Syncope - cause unclear likely combination of dehydration induced mild orthostatics + ACE + Flomax + mild relative bradycardia - Has been hydrated, orthostasis has resolved, still is mildly bradycardic but upon ambulating heart rate stays around 50, echocardiogram pending, ruled out MI, seen by cardiology, if echo stable and he's symptom-free likely discharged after echo report either later tonight or early tomorrow. We'll resume Flomax upon discharge, cutdown ACE inhibitor, apply TEDs, follow with PCP.  2. Sinus bradycardia. Moderate chronotropic response upon ambulating, heart rate in mid 50s, patient symptom-free, outpatient follow-up with cardiologist Dr. Meda Page, likely not a pacemaker candidate at this time.  3. Hypertension. Resume ACE inhibitor at half home dose, and monitor.  4.BPH - continue home medications. Resume Flomax, if again symptomatic PCP to reevaluate.  5. Dyslipidemia. Continue statin.    Family Communication  :  wife  Code Status :  Full  Diet : Heart Healthy  Disposition Plan  :  Home  Consults  :  Cards  Procedures  :    DVT Prophylaxis  :   Heparin    Lab Results  Component Value Date   PLT 192 01/18/2016    Inpatient  Medications  Scheduled Meds: . aspirin EC  81 mg Oral Daily  . brimonidine  1 drop Both Eyes QHS  . dutasteride  0.5 mg Oral Daily  . heparin  5,000 Units Subcutaneous Q8H  . multivitamin  1 tablet Oral Daily  . simvastatin  20 mg Oral q1800  . sodium chloride flush  3 mL Intravenous Q12H   Continuous Infusions:  PRN Meds:.acetaminophen **OR** acetaminophen, fluticasone, HYDROcodone-acetaminophen, sodium chloride  Antibiotics  :    Anti-infectives    None         Objective:   Vitals:   01/19/16 1024 01/19/16 1027 01/19/16 1030 01/19/16 1043  BP:    (!) 161/61  Pulse: (!) 52 (!) 54 (!) 54 (!) 50  Resp:      Temp:      TempSrc:      SpO2:      Weight:      Height:        Wt Readings from Last 3 Encounters:  01/19/16 68.5 kg (151 lb 1.6 oz)  09/04/14 69.9 kg (154 lb)  03/23/13 70.3 kg (155 lb)     Intake/Output Summary (Last 24 hours) at 01/19/16 1128 Last data filed at 01/19/16 1046  Gross per 24 hour  Intake              600 ml  Output              450 ml  Net              150 ml     Physical Exam  Awake Alert, Oriented X 3, No new F.N deficits, Normal affect [place].AT,PERRAL Supple Neck,No JVD, No cervical lymphadenopathy appriciated.  Symmetrical Chest wall movement, Good air movement bilaterally, CTAB RRR,No Gallops,Rubs or new Murmurs, No Parasternal Heave +ve B.Sounds, Abd Soft, No tenderness, No organomegaly appriciated, No rebound - guarding or rigidity. No Cyanosis, Clubbing or edema,  No new Rash or bruise     Data Review:    CBC  Recent Labs Lab 01/18/16 1901  WBC 7.0  HGB 13.1  HCT 41.1  PLT 192  MCV 97.2  MCH 31.0  MCHC 31.9  RDW 13.6    Chemistries   Recent Labs Lab 01/18/16 1901 01/19/16 0435  NA 135 135  K 4.3 3.9  CL 100* 103  CO2 27 28  GLUCOSE 117* 87  BUN 17 15  CREATININE 1.20 1.01  CALCIUM 9.0 8.2*    ------------------------------------------------------------------------------------------------------------------ No results for input(s): CHOL, HDL, LDLCALC, TRIG, CHOLHDL, LDLDIRECT in the last 72 hours.  No results found for: HGBA1C ------------------------------------------------------------------------------------------------------------------  Recent Labs  01/19/16 0827  TSH 1.180   ------------------------------------------------------------------------------------------------------------------ No results for input(s): VITAMINB12, FOLATE, FERRITIN, TIBC, IRON, RETICCTPCT in the last 72 hours.  Coagulation profile No results for input(s): INR, PROTIME in the last 168 hours.  No results for input(s): DDIMER in the last 72 hours.  Cardiac Enzymes  Recent Labs Lab 01/18/16 2305 01/19/16 0435  TROPONINI <0.03 <0.03   ------------------------------------------------------------------------------------------------------------------ No results found for: BNP  Micro Results No results found for this or any previous visit (from the past 240 hour(s)).  Radiology Reports Dg Chest 2 View  Result Date: 01/18/2016 CLINICAL DATA:  Syncope and bradycardia. EXAM: CHEST  2 VIEW COMPARISON:  01/11/2013 and prior chest radiographs FINDINGS: Mild cardiomegaly noted. Left hemidiaphragm elevation and left basilar scarring again noted. There is no evidence of focal airspace disease, pulmonary edema, suspicious pulmonary nodule/mass, pleural effusion, or pneumothorax. No acute bony abnormalities are identified. IMPRESSION: No evidence of acute cardiopulmonary disease. Electronically Signed   By: Margarette Canada M.D.   On: 01/18/2016 19:35    Time Spent in minutes  30   Meshulem Onorato K M.D on 01/19/2016 at 11:28 AM  Between 7am to 7pm - Pager - [phone]  After 7pm go to [email or web address] - password [facility]  [facility] -  Office  [phone]

## 2016-01-19 NOTE — Discharge Instructions (Signed)
Follow with Primary MD Marton Redwood, MD in 7 days   Get CBC, CMP, 2 view Chest X ray checked  by Primary MD or SNF MD in 5-7 days ( we routinely change or add medications that can affect your baseline labs and fluid status, therefore we recommend that you get the mentioned basic workup next visit with your PCP, your PCP may decide not to get them or add new tests based on their clinical decision)   Activity: As tolerated with Full fall precautions use walker/cane & assistance as needed   Disposition Home     Diet:   Heart Healthy    For Heart failure patients - Check your Weight same time everyday, if you gain over 2 pounds, or you develop in leg swelling, experience more shortness of breath or chest pain, call your Primary MD immediately. Follow Cardiac Low Salt Diet and 1.5 lit/day fluid restriction.   On your next visit with your primary care physician please Get Medicines reviewed and adjusted.   Please request your Prim.MD to go over all Hospital Tests and Procedure/Radiological results at the follow up, please get all Hospital records sent to your Prim MD by signing hospital release before you go home.   If you experience worsening of your admission symptoms, develop shortness of breath, life threatening emergency, suicidal or homicidal thoughts you must seek medical attention immediately by calling 911 or calling your MD immediately  if symptoms less severe.  You Must read complete instructions/literature along with all the possible adverse reactions/side effects for all the Medicines you take and that have been prescribed to you. Take any new Medicines after you have completely understood and accpet all the possible adverse reactions/side effects.   Do not drive, operate heavy machinery, perform activities at heights, swimming or participation in water activities or provide baby sitting services if your were admitted for syncope or siezures until you have seen by Primary MD or a  Neurologist and advised to do so again.  Do not drive when taking Pain medications.    Do not take more than prescribed Pain, Sleep and Anxiety Medications  Special Instructions: If you have smoked or chewed Tobacco  in the last 2 yrs please stop smoking, stop any regular Alcohol  and or any Recreational drug use.  Wear Seat belts while driving.   Please note  You were cared for by a hospitalist during your hospital stay. If you have any questions about your discharge medications or the care you received while you were in the hospital after you are discharged, you can call the unit and asked to speak with the hospitalist on call if the hospitalist that took care of you is not available. Once you are discharged, your primary care physician will handle any further medical issues. Please note that NO REFILLS for any discharge medications will be authorized once you are discharged, as it is imperative that you return to your primary care physician (or establish a relationship with a primary care physician if you do not have one) for your aftercare needs so that they can reassess your need for medications and monitor your lab values.

## 2016-01-19 NOTE — Progress Notes (Signed)
Pt ambulated in a hallway (BP- 161/61 and pulse 50, oxygen sat is 99% in RA), pt walked without any exertion and SOB, tolerated well, orthostatic BP taken and recorded, will continue to monitor  Palma Holter, RN

## 2016-01-19 NOTE — Progress Notes (Signed)
  PT Cancellation/Discharge Note  Patient Details Name: Kevin Page MRN: [id number] DOB: [birth year]   Cancelled Treatment:    Reason Eval/Treat Not Completed: PT screened, no needs identified, will sign off. Patient reports he has been ambulating entire hallway without difficulty.  Politely declines PT services.   Despina Pole 01/19/2016, 4:51 PM Carita Pian. Sanjuana Kava, [place] Pager [phone]

## 2016-01-20 ENCOUNTER — Other Ambulatory Visit: Payer: Self-pay | Admitting: Cardiology

## 2016-01-20 ENCOUNTER — Observation Stay (HOSPITAL_COMMUNITY): Payer: Medicare HMO

## 2016-01-20 DIAGNOSIS — R55 Syncope and collapse: Secondary | ICD-10-CM | POA: Diagnosis not present

## 2016-01-20 DIAGNOSIS — I77810 Thoracic aortic ectasia: Secondary | ICD-10-CM

## 2016-01-20 DIAGNOSIS — N400 Enlarged prostate without lower urinary tract symptoms: Secondary | ICD-10-CM | POA: Diagnosis not present

## 2016-01-20 DIAGNOSIS — R001 Bradycardia, unspecified: Secondary | ICD-10-CM

## 2016-01-20 DIAGNOSIS — I951 Orthostatic hypotension: Secondary | ICD-10-CM

## 2016-01-20 DIAGNOSIS — E785 Hyperlipidemia, unspecified: Secondary | ICD-10-CM | POA: Diagnosis not present

## 2016-01-20 DIAGNOSIS — I10 Essential (primary) hypertension: Secondary | ICD-10-CM | POA: Diagnosis not present

## 2016-01-20 LAB — GLUCOSE, CAPILLARY: GLUCOSE-CAPILLARY: 86 mg/dL (ref 65–99)

## 2016-01-20 LAB — ECHOCARDIOGRAM COMPLETE
Height: 71 in
WEIGHTICAEL: 2414.4 [oz_av]

## 2016-01-20 MED ORDER — LISINOPRIL 5 MG PO TABS: 5.0000 mg | ORAL_TABLET | Freq: Every day | ORAL | 0 refills | Status: DC

## 2016-01-20 NOTE — Discharge Summary (Signed)
Pt got discharged to home, discharge instructions provided and patient showed understanding to it, IV taken out,Telemonitor DC,pt left unit in wheelchair with all of the belongings accompanied with a family member (wife) 

## 2016-01-20 NOTE — Progress Notes (Signed)
Patient Name: Kevin Page Date of Encounter: 01/20/2016  Principal Problem:   Syncope Active Problems:   Hypertension   Hyperlipidemia   BPH (benign prostatic hyperplasia)   Sinus bradycardia seen on cardiac monitor   Orthostasis   Dilated aortic root (HCC)   Mild aortic insufficiency   Length of Stay: 0  SUBJECTIVE  The patient states that he feels great today and had no episodes of dizziness or presyncope.  CURRENT MEDS . aspirin EC  81 mg Oral Daily  . brimonidine  1 drop Both Eyes QHS  . dutasteride  0.5 mg Oral Daily  . heparin  5,000 Units Subcutaneous Q8H  . lisinopril  5 mg Oral Daily  . multivitamin  1 tablet Oral Daily  . simvastatin  20 mg Oral q1800  . sodium chloride flush  3 mL Intravenous Q12H   OBJECTIVE  Vitals:   01/19/16 1043 01/19/16 1219 01/19/16 2015 01/20/16 0629  BP: (!) 161/61 (!) 148/68 (!) 157/86 (!) 141/70  Pulse: (!) 50 (!) 52 (!) 55 61  Resp:  20 18   Temp:  99.1 F (37.3 C) 99 F (37.2 C)   TempSrc:  Oral Oral   SpO2:  100% 99% 97%  Weight:    150 lb 14.4 oz (68.4 kg)  Height:        Intake/Output Summary (Last 24 hours) at 01/20/16 1023 Last data filed at 01/20/16 0827  Gross per 24 hour  Intake              960 ml  Output             1550 ml  Net             -590 ml   Filed Weights   01/18/16 2303 01/19/16 0531 01/20/16 0629  Weight: 151 lb (68.5 kg) 151 lb 1.6 oz (68.5 kg) 150 lb 14.4 oz (68.4 kg)   PHYSICAL EXAM  General: Pleasant, NAD. Neuro: Alert and oriented X 3. Moves all extremities spontaneously. Psych: Normal affect. HEENT:  Normal  Neck: Supple without bruits or JVD. Lungs:  Resp regular and unlabored, CTA. Heart: RRR no s3, s4, or murmurs. Abdomen: Soft, non-tender, non-distended, BS + x 4.  Extremities: No clubbing, cyanosis or edema. DP/PT/Radials 2+ and equal bilaterally.  Accessory Clinical Findings  CBC  Recent Labs  01/18/16 1901  WBC 7.0  HGB 13.1  HCT 41.1  MCV 97.2  PLT [id number]    Basic Metabolic Panel  Recent Labs  01/18/16 1901 01/19/16 0435  NA 135 135  K 4.3 3.9  CL 100* 103  CO2 27 28  GLUCOSE 117* 87  BUN 17 15  CREATININE 1.20 1.01  CALCIUM 9.0 8.2*    Recent Labs  01/18/16 2305 01/19/16 0435 01/19/16 1201  TROPONINI <0.03 <0.03 <0.03    Recent Labs  01/19/16 0827  TSH 1.180    Radiology/Studies  Dg Chest 2 View  Result Date: 01/18/2016 CLINICAL DATA:  Syncope and bradycardia. EXAM: CHEST  2 VIEW COMPARISON:  01/11/2013 and prior chest radiographs FINDINGS: Mild cardiomegaly noted. Left hemidiaphragm elevation and left basilar scarring again noted. There is no evidence of focal airspace disease, pulmonary edema, suspicious pulmonary nodule/mass, pleural effusion, or pneumothorax. No acute bony abnormalities are identified. IMPRESSION: No evidence of acute cardiopulmonary disease. Electronically Signed   By: Margarette Canada M.D.   On: 01/18/2016 19:35    TELE: Sinus bradycardia with heart rate from 41-65, the longest R to R interval  1.6 seconds.    ASSESSMENT AND PLAN  82 y/o M with history of HTN, HLD, BPH, OA, macular degeneration, habitual alcohol intake (1 drink of gin daily), former tobacco abuse (44 yrs, quit 1990s), syncope 2014 (event monitor with SB 40s-50s, brief SVT), mild AI and mildly dilated aortic root in 2014 whom we are asked to see for syncope.  1. Syncope - etiology unclear. Possibly due to orthostasis. He had similar episode in 2014, even though his symptoms suggest orthostasis when his episode happen after prolonged standing, telemetry shows bradycardia predominantly during sleep however also at 10 AM with heart rate down to 41. Longest R to R. 1.6 seconds and patient was not symptomatic at that time. His TSH is normal. He is advised to maintain good hydration. We will discharge home and start 2 week event monitor followed by follow-up in the clinic. His echocardiogram today shows normal LVEF, diastolic dysfunction with  normal filling pressures, sclerotic aortic valve without any significant stenosis and normal right-sided pressures.  2. Sinus bradycardia - check TSH, free T4. Avoid AVN blocking agents.  3. Orthostasis in the setting of history of HTN - lisinopril and Flomax on hold by primary team. His blood pressure is now elevated I would restart and recheck his blood pressure in the clinic.   Signed, Ena Dawley MD, [facility] 01/20/2016

## 2016-01-20 NOTE — Care Management Note (Signed)
Case Management Note  Patient Details  Name: Kevin Page MRN: [id number] Date of Birth: 10-22-1930  Subjective/Objective:                    Action/Plan: Pt discharging home with self care and his wife. No further needs per CM.   Expected Discharge Date:  01/19/16               Expected Discharge Plan:  Home/Self Care  In-House Referral:     Discharge planning Services     Post Acute Care Choice:    Choice offered to:     DME Arranged:    DME Agency:     HH Arranged:    HH Agency:     Status of Service:  Completed, signed off  If discussed at [employer] of Stay Meetings, dates discussed:    Additional Comments:  Pollie Friar, RN 01/20/2016, 11:49 AM

## 2016-01-20 NOTE — Progress Notes (Signed)
   2 week monitor ordered. Our office will call the patient to arrange pick up and will also schedule 4-6 week f/u with Dr. Meda Coffee.  Alexsys Eskin Rosita Fire 01/20/2016

## 2016-01-20 NOTE — Care Management Obs Status (Signed)
[place] NOTIFICATION   Patient Details  Name: Kevin Page MRN: [id number] Date of Birth: [birth year]   Medicare Observation Status Notification Given:  Yes    Pollie Friar, RN 01/20/2016, 11:49 AM

## 2016-01-20 NOTE — Discharge Summary (Signed)
Kevin Page [id number] DOB: September 07, 1930 DOA: 01/18/2016  PCP: Marton Redwood, MD  Admit date: 01/18/2016  Discharge date: 01/20/2016  Admitted From: Home   Disposition:  Home   Recommendations for Outpatient Follow-up:   Follow up with PCP in 1-2 weeks  PCP Please obtain BMP/CBC, 2 view CXR in 1week,  (see Discharge instructions)   PCP Please follow up on the following pending results: None   Home Health: None   Equipment/Devices: None  Consultations: Cardw Discharge Condition: Stable   CODE STATUS: Full   Diet Recommendation: Heart healthy   Chief Complaint  Patient presents with  . Near Syncope     Brief history of present illness from the day of admission and additional interim summary      Kevin Page a 82 y.o.malewith medical history significant for hypertension, hyperlipidemia, BPH, and history of syncope in 2014 he presents to the emergency department for evaluation of a syncopal episode. Patient reports being in his usual state of health today when he went to church. While there, he began to feel weak and sat down. The next thing he recalls is EMS arriving to assess him. There was a preceding weakness, but no chest pain or palpitations, and no headache or focal numbness or weakness. Patient denies any lower extremity edema, recent surgery or long distance travel, cough, or hemoptysis. He reports having little to eat today prior to the episode, but his wife at the bedside states that he seemed to eat his normal amount. Patient had a very similar episode in 2014 and was evaluated with a largely unremarkable echocardiogram at that time. He was also evaluated with Holter monitor which demonstrated a 12-beatrun of atrial fibrillation and some bradycardia with average rate in the 50s. There had been no  recurrence of syncope or near syncope until today.  ED Course:Upon arrival to the ED, patient is found to be afebrile, saturating well on room air, bradycardic to the high 40s, and with normal blood pressure. EKG features a sinus bradycardia with rate 49 and is otherwise normal. Chest x-ray is negative for acute cardiopulmonary disease. Chemistry panel features a serum creatinine 1.20, up from 1.0 in 2014. CBC is unremarkable and troponin is undetectable. Orthostatic vital signs were obtained and there was an 18 mmHg drop in systolic pressures from laying to standing, along with a concomitant 20 2B per minute rise in heart rate. Patient was given a 500 ccnormal saline bolus in the ED, remained hemodynamically stable, and will be observed on the telemetry unit for ongoing evaluation and management of syncopal episode.Kevin Luedtkeis a 82 y.o.malewith medical history significant for hypertension, hyperlipidemia, BPH, and history of syncope in 2014 he presents to the emergency department for evaluation of a syncopal episode. Patient reports being in his usual state of health today when he went to church. While there, he began to feel weak and sat down. The next thing he recalls is EMS arriving to assess him. There was a preceding weakness, but no chest pain or palpitations, and no headache  or focal numbness or weakness. Patient denies any lower extremity edema, recent surgery or long distance travel, cough, or hemoptysis. He reports having little to eat today prior to the episode, but his wife at the bedside states that he seemed to eat his normal amount. Patient had a very similar episode in 2014 and was evaluated with a largely unremarkable echocardiogram at that time. He was also evaluated with Holter monitor which demonstrated a 12-beatrun of atrial fibrillation and some bradycardia with average rate in the 50s. There had been no recurrence of syncope or near syncope until today.  ED Course:Upon arrival  to the ED, patient is found to be afebrile, saturating well on room air, bradycardic to the high 40s, and with normal blood pressure. EKG features a sinus bradycardia with rate 49 and is otherwise normal. Chest x-ray is negative for acute cardiopulmonary disease. Chemistry panel features a serum creatinine 1.20, up from 1.0 in 2014. CBC is unremarkable and troponin is undetectable. Orthostatic vital signs were obtained and there was an 18 mmHg drop in systolic pressures from laying to standing, along with a concomitant 20 2B per minute rise in heart rate. Patient was given a 500 ccnormal saline bolus in the ED, remained hemodynamically stable, and will be observed on the telemetry unit for ongoing evaluation and management of syncopal episode.  Hospital issues addressed     1. Syncope - cause unclear likely combination of dehydration induced mild orthostatics + ACE + Flomax + mild relative bradycardia - Has been hydrated, orthostasis has resolved, still is mildly bradycardic but upon ambulating heart rate stays around 50, echocardiogram Stable as below, ruled out MI, seen by cardiology, cardiology stable for discharge with outpatient cardiology follow-up, we'll stop Flomax and reduce ACE inhibitor by half home dose, request PCP to continue monitor orthostatics and symptoms.  2. Sinus bradycardia. Moderate chronotropic response upon ambulating, heart rate in mid 50s, patient symptom-free, outpatient follow-up with cardiologist Dr. Meda Coffee, likely not a pacemaker candidate at this time.  3. Hypertension. Resumed ACE inhibitor at half home dose, blood pressure stable PCP to continue monitoring.  4.BPH - continue home medications. Continue Avodart hold Flomax for syncope, PCP to monitor BPH symptoms closely.  5. Dyslipidemia. Continue statin.   Discharge diagnosis     Principal Problem:   Syncope Active Problems:   Hypertension   Hyperlipidemia   BPH (benign prostatic hyperplasia)   Sinus  bradycardia seen on cardiac monitor   Orthostasis   Dilated aortic root (HCC)   Mild aortic insufficiency    Discharge instructions    Discharge Instructions    Diet - low sodium heart healthy    Complete by:  As directed   Discharge instructions    Complete by:  As directed   Follow with Primary MD Marton Redwood, MD in 7 days   Get CBC, CMP, 2 view Chest X ray checked  by Primary MD or SNF MD in 5-7 days ( we routinely change or add medications that can affect your baseline labs and fluid status, therefore we recommend that you get the mentioned basic workup next visit with your PCP, your PCP may decide not to get them or add new tests based on their clinical decision)   Activity: As tolerated with Full fall precautions use walker/cane & assistance as needed   Disposition Home     Diet:   Heart Healthy    For Heart failure patients - Check your Weight same time everyday, if you gain over 2  pounds, or you develop in leg swelling, experience more shortness of breath or chest pain, call your Primary MD immediately. Follow Cardiac Low Salt Diet and 1.5 lit/day fluid restriction.   On your next visit with your primary care physician please Get Medicines reviewed and adjusted.   Please request your Prim.MD to go over all Hospital Tests and Procedure/Radiological results at the follow up, please get all Hospital records sent to your Prim MD by signing hospital release before you go home.   If you experience worsening of your admission symptoms, develop shortness of breath, life threatening emergency, suicidal or homicidal thoughts you must seek medical attention immediately by calling 911 or calling your MD immediately  if symptoms less severe.  You Must read complete instructions/literature along with all the possible adverse reactions/side effects for all the Medicines you take and that have been prescribed to you. Take any new Medicines after you have completely understood and accpet  all the possible adverse reactions/side effects.   Do not drive, operate heavy machinery, perform activities at heights, swimming or participation in water activities or provide baby sitting services if your were admitted for syncope or siezures until you have seen by Primary MD or a Neurologist and advised to do so again.  Do not drive when taking Pain medications.    Do not take more than prescribed Pain, Sleep and Anxiety Medications  Special Instructions: If you have smoked or chewed Tobacco  in the last 2 yrs please stop smoking, stop any regular Alcohol  and or any Recreational drug use.  Wear Seat belts while driving.   Please note  You were cared for by a hospitalist during your hospital stay. If you have any questions about your discharge medications or the care you received while you were in the hospital after you are discharged, you can call the unit and asked to speak with the hospitalist on call if the hospitalist that took care of you is not available. Once you are discharged, your primary care physician will handle any further medical issues. Please note that NO REFILLS for any discharge medications will be authorized once you are discharged, as it is imperative that you return to your primary care physician (or establish a relationship with a primary care physician if you do not have one) for your aftercare needs so that they can reassess your need for medications and monitor your lab values.   Increase activity slowly    Complete by:  As directed      Discharge Medications     Medication List    STOP taking these medications   tamsulosin 0.4 MG Caps capsule Commonly known as:  FLOMAX     TAKE these medications   alendronate 70 MG tablet Commonly known as:  FOSAMAX Take 70 mg by mouth every Thursday. Take with a full glass of water on an empty stomach.   aspirin EC 81 MG tablet Take 81 mg by mouth daily.   brimonidine 0.2 % ophthalmic solution Commonly known  as:  ALPHAGAN Place 1 drop into both eyes at bedtime.   dutasteride 0.5 MG capsule Commonly known as:  AVODART Take 0.5 mg by mouth daily.   fluticasone 50 MCG/ACT nasal spray Commonly known as:  FLONASE Place 1 spray into both nostrils daily as needed for allergies or rhinitis.   lisinopril 5 MG tablet Commonly known as:  PRINIVIL,ZESTRIL Take 1 tablet (5 mg total) by mouth daily. What changed:  medication strength  how much to  take   [place] PO Take 2 tablets by mouth daily.   simvastatin 20 MG tablet Commonly known as:  ZOCOR Take 20 mg by mouth Daily.   sodium chloride 0.65 % Soln nasal spray Commonly known as:  OCEAN Place 1 spray into both nostrils at bedtime as needed for congestion.       Follow-up Information    Marton Redwood, MD. Schedule an appointment as soon as possible for a visit in 1 week(s).   Specialty:  Internal Medicine Contact information: [place] [address] [phone]        Ena Dawley, MD. Schedule an appointment as soon as possible for a visit in 1 week(s).   Specialty:  Cardiology Contact information: [place] [address] [phone]           Major procedures and Radiology Reports - PLEASE review detailed and final reports thoroughly  -       TTE  Left ventricle: The cavity size was normal. Wall thickness was increased in a pattern of mild LVH. Systolic function was normal. The estimated ejection fraction was in the range of 55% to 60%. Doppler parameters are consistent with abnormal left ventricular relaxation (grade 1 diastolic dysfunction). - Aortic valve: Moderately calcified annulus. Trileaflet; moderately thickened leaflets. There was mild regurgitation. Valve area (VTI): 2.74 cm^2. Valve area (Vmax): 2.39 cm^2. Valve area (Vmean): 2.31 cm^2. - Atrial septum: No defect or patent foramen ovale was identified. - Pericardium, extracardiac: There is a small  pericardial effusion adjacent to the inferior LV wall. There is no evidence of tamponade physiology by echo.   Dg Chest 2 View  Result Date: 01/18/2016 CLINICAL DATA:  Syncope and bradycardia. EXAM: CHEST  2 VIEW COMPARISON:  01/11/2013 and prior chest radiographs FINDINGS: Mild cardiomegaly noted. Left hemidiaphragm elevation and left basilar scarring again noted. There is no evidence of focal airspace disease, pulmonary edema, suspicious pulmonary nodule/mass, pleural effusion, or pneumothorax. No acute bony abnormalities are identified. IMPRESSION: No evidence of acute cardiopulmonary disease. Electronically Signed   By: Margarette Canada M.D.   On: 01/18/2016 19:35    Micro Results     No results found for this or any previous visit (from the past 240 hour(s)).  Today   Subjective    [name] today has no headache,no chest abdominal pain,no new weakness tingling or numbness, feels much better wants to go home today.    Objective   Blood pressure (!) 141/70, pulse 61, temperature 99 F (37.2 C), temperature source Oral, resp. rate 18, height 5\' 11"  (1.803 m), weight 68.4 kg (150 lb 14.4 oz), SpO2 97 %.   Intake/Output Summary (Last 24 hours) at 01/20/16 1100 Last data filed at 01/20/16 0827  Gross per 24 hour  Intake              960 ml  Output             1450 ml  Net             -490 ml    Exam Awake Alert, Oriented x 3, No new F.N deficits, Normal affect [place].AT,PERRAL Supple Neck,No JVD, No cervical lymphadenopathy appriciated.  Symmetrical Chest wall movement, Good air movement bilaterally, CTAB RRR,No Gallops,Rubs or new Murmurs, No Parasternal Heave +ve B.Sounds, Abd Soft, Non tender, No organomegaly appriciated, No rebound -guarding or rigidity. No Cyanosis, Clubbing or edema, No new Rash or bruise   Data Review   CBC  w Diff: Lab Results  Component Value Date   WBC 7.0 01/18/2016   HGB 13.1 01/18/2016   HCT 41.1 01/18/2016   PLT 192 01/18/2016   LYMPHOPCT 7  (L) 01/11/2013   MONOPCT 4 01/11/2013   EOSPCT 1 01/11/2013   BASOPCT 0 01/11/2013    CMP: Lab Results  Component Value Date   NA 135 01/19/2016   K 3.9 01/19/2016   CL 103 01/19/2016   CO2 28 01/19/2016   BUN 15 01/19/2016   CREATININE 1.01 01/19/2016   PROT 6.1 01/11/2013   ALBUMIN 3.7 01/11/2013   BILITOT 0.4 01/11/2013   ALKPHOS 49 01/11/2013   AST 24 01/11/2013   ALT 16 01/11/2013  .   Total Time in preparing paper work, data evaluation and todays exam - 35 minutes  Thurnell Lose M.D on 01/20/2016 at 11:00 AM  [facility]   Office  [phone]

## 2016-01-21 ENCOUNTER — Telehealth: Payer: Self-pay | Admitting: Cardiology

## 2016-01-21 NOTE — Telephone Encounter (Signed)
New message  Pt wife call requesting to speak with RN. Pt wife states pt was release from the hospital with instructions from Dr. Meda Coffee to make an appt with her IMMEDIATELY. I offered her next available with provider and PA but she refused stating neither was immediate enough. Pt wife wants pt to be seen this week. Please call back to discuss

## 2016-01-21 NOTE — Telephone Encounter (Signed)
Spoke with the pt and wife and informed them that per Dr Francesca Oman progress note from seeing the pt in the hospital, the pt should follow-up in clinic x 1 week with an Extender in the office.  Scheduled the pt to see Estella Husk PA-C on Monday  01/27/16 at 12:15 pm, [employer].  Informed the pts wife of this, being she is his means of transportation.  Wife verbalized understanding, agrees with this plan, and gracious for all the assistance provided.

## 2016-01-22 ENCOUNTER — Encounter: Payer: Self-pay | Admitting: Physician Assistant

## 2016-01-23 DIAGNOSIS — Z85828 Personal history of other malignant neoplasm of skin: Secondary | ICD-10-CM | POA: Diagnosis not present

## 2016-01-23 DIAGNOSIS — C44319 Basal cell carcinoma of skin of other parts of face: Secondary | ICD-10-CM | POA: Diagnosis not present

## 2016-01-24 DIAGNOSIS — I951 Orthostatic hypotension: Secondary | ICD-10-CM | POA: Diagnosis not present

## 2016-01-24 DIAGNOSIS — R55 Syncope and collapse: Secondary | ICD-10-CM | POA: Diagnosis not present

## 2016-01-24 DIAGNOSIS — R001 Bradycardia, unspecified: Secondary | ICD-10-CM | POA: Diagnosis not present

## 2016-01-27 ENCOUNTER — Ambulatory Visit (INDEPENDENT_AMBULATORY_CARE_PROVIDER_SITE_OTHER): Payer: Medicare HMO | Admitting: Physician Assistant

## 2016-01-27 ENCOUNTER — Encounter: Payer: Self-pay | Admitting: Physician Assistant

## 2016-01-27 VITALS — BP 130/72 | HR 50 | Ht 71.0 in | Wt 154.0 lb

## 2016-01-27 DIAGNOSIS — R001 Bradycardia, unspecified: Secondary | ICD-10-CM

## 2016-01-27 DIAGNOSIS — I10 Essential (primary) hypertension: Secondary | ICD-10-CM

## 2016-01-27 DIAGNOSIS — R55 Syncope and collapse: Secondary | ICD-10-CM | POA: Diagnosis not present

## 2016-01-27 DIAGNOSIS — I951 Orthostatic hypotension: Secondary | ICD-10-CM | POA: Diagnosis not present

## 2016-01-27 DIAGNOSIS — E785 Hyperlipidemia, unspecified: Secondary | ICD-10-CM

## 2016-01-27 NOTE — Progress Notes (Signed)
Cardiology Office Note    Date:  01/27/2016   ID:  Kevin Page, DOB August 15, 1930, MRN [id number]  PCP:  Marton Redwood, MD  Cardiologist:  Dr. Meda Coffee  Chief Complaint: Hospital follow up for syncope  History of Present Illness:   Kevin Page is a 82 y.o. male with hx of HTN, HLD, BPH, OA, macular degeneration, habitual alcohol intake (1 drink of gin daily), former tobacco abuse (44 yrs, quit 1990s), syncope 2014 (event monitor with SB 40s-50s, brief SVT), mild AI and mildly dilated aortic root in 2014 and recently admitted for syncope who presented for follow up.   He had an episode of syncope in 12/2012 following a stressful day where he hadn't had a lot of fluids. 2D echo 01/12/13: EF 55-60%, grade 1 DD, mild AI, mildly dilated aortic root, mild MR, mild-mod dilated. 48 hour Holter showed episodes of sinus bradycardia with longest pause 1.8 seconds, also with a few episodes of SVT, the longest run lasting 12 beats with rate 128 BPM -> he did have recordings of HR in the 40s at that time. This was felt possibly indicative of SSS, although there was no indication for PPM at that time. He also underwent GXT that did not show chronotropic incompetence.  He was in [place] up until 01/18/16 when he had episode of syncope. Workup showed an 13mmHg drop in BP in ED with 20 beat rise in HR, suggestive of orthostasis. He was treated with IVF. His episode of syncope after standing for prolonged period of time. Telemetry showed bradycardia predominantly during sleep however also at 10 AM with heart rate down to 41. Longest R to R. 1.6 seconds and patient was not symptomatic at that time. His TSH is normal. Echocardiogram today shows normal LVEF, diastolic dysfunction with normal filling pressures, sclerotic aortic valve without any significant stenosis and normal right-sided pressures. lisinopril and Flomax as held. Then his BP was elevated and resumed half dose of lisinopril. He is advised to maintain good  hydration and discharged home stable with plan to place 2 weeks monitor.   Seen by PCP 01/24/16 were he was orhostatic. Laying 139/77 sitting 120/74 standing 106/79. His lisinopril reduced further to 5mg  qd.   Here today for follow up. No further syncope episode. Denies dizziness while standing. BP has been in range of 140s/70s at home with HR of 50s. Denies chest pain, SOB, LE edema, orthopnea, PND. He has cut back on alcohol.    Past Medical History:  Diagnosis Date  . Allergy    dust and mites  . BPH (benign prostatic hyperplasia)   . Essential hypertension   . Hyperlipidemia   . Macular degeneration   . Osteoarthritis of both knees   . Sinus bradycardia   . SVT (supraventricular tachycardia) ([place])    a. Event monitor 2014: few episodes of SVT, longest 12 beats with rate 128.  [name] Syncope     Past Surgical History:  Procedure Laterality Date  . CATARACT EXTRACTION W/ INTRAOCULAR LENS  IMPLANT, BILATERAL Bilateral 2011  . dupytron contractions  1998 , 2006   both hands  . LUMBAR FUSION  1994    Current Medications: Prior to Admission medications   Medication Sig Start Date End Date Taking? Authorizing Provider  alendronate (FOSAMAX) 70 MG tablet Take 70 mg by mouth every Thursday. Take with a full glass of water on an empty stomach.    Historical Provider, MD  aspirin EC 81 MG tablet Take 81 mg by mouth daily.  Historical Provider, MD  brimonidine (ALPHAGAN) 0.2 % ophthalmic solution Place 1 drop into both eyes at bedtime. 11/14/15   Historical Provider, MD  dutasteride (AVODART) 0.5 MG capsule Take 0.5 mg by mouth daily.    Historical Provider, MD  fluticasone (FLONASE) 50 MCG/ACT nasal spray Place 1 spray into both nostrils daily as needed for allergies or rhinitis.    Historical Provider, MD  lisinopril (PRINIVIL,ZESTRIL) 5 MG tablet Take 1 tablet (5 mg total) by mouth daily. 01/20/16   Thurnell Lose, MD  Multiple Vitamins-Minerals [facility] MACULAR HEALTH PO) Take 2 tablets by  mouth daily.    Historical Provider, MD  simvastatin (ZOCOR) 20 MG tablet Take 20 mg by mouth Daily.  10/19/11   Historical Provider, MD  sodium chloride (OCEAN) 0.65 % SOLN nasal spray Place 1 spray into both nostrils at bedtime as needed for congestion.    Historical Provider, MD    Allergies:   Review of patient's allergies indicates no known allergies.   Social History   Social History  . Marital status: Married    Spouse name: N/A  . Number of children: N/A  . Years of education: N/A   Social History Main Topics  . Smoking status: Former Research scientist (life sciences)  . Smokeless tobacco: Never Used     Comment: Smoked for 44 years, quit 1990s  . Alcohol use 6.0 oz/week    12 Standard drinks or equivalent per week     Comment: 1 drink of gin nightly, occasionally more if going out to dinner.  . Drug use: No  . Sexual activity: Not Asked   Other Topics Concern  . None   Social History Narrative  . None     Family History:  The patient's family history includes Cancer in his brother.   ROS:   Please see the history of present illness.    ROS All other systems reviewed and are negative.   PHYSICAL EXAM:   VS:  BP 130/72   Pulse (!) 50   Ht 5\' 11"  (1.803 m)   Wt 154 lb (69.9 kg)   BMI 21.48 kg/m    GEN: Well nourished, well developed, in no acute distress  HEENT: normal  Neck: no JVD, carotid bruits, or masses Cardiac: RRR; no murmurs, rubs, or gallops,no edema  Respiratory:  clear to auscultation bilaterally, normal work of breathing GI: soft, nontender, nondistended, + BS MS: no deformity or atrophy  Skin: warm and dry, no rash Neuro:  Alert and Oriented x 3, Strength and sensation are intact Psych: euthymic mood, full affect  Wt Readings from Last 3 Encounters:  01/27/16 154 lb (69.9 kg)  01/20/16 150 lb 14.4 oz (68.4 kg)  09/04/14 154 lb (69.9 kg)      Studies/Labs Reviewed:   EKG:  EKG  Today showed sinus bradycardia at rate of 50 bpm with PACs.   Recent  Labs: 01/18/2016: Hemoglobin 13.1; Platelets 192 01/19/2016: BUN 15; Creatinine, Ser 1.01; Potassium 3.9; Sodium 135; TSH 1.180   Lipid Panel No results found for: CHOL, TRIG, HDL, CHOLHDL, VLDL, LDLCALC, LDLDIRECT  Additional studies/ records that were reviewed today include:   Echocardiogram: 01/20/16  LV EF: 55% -   60%  ------------------------------------------------------------------- Indications:      Syncope 780.2.  ------------------------------------------------------------------- History:   PMH:  Similar Episode in 2014,.  Risk factors: Hypertension. Dyslipidemia.  ------------------------------------------------------------------- Study Conclusions  - Left ventricle: The cavity size was normal. Wall thickness was   increased in a pattern of mild LVH.  Systolic function was normal.   The estimated ejection fraction was in the range of 55% to 60%.   Doppler parameters are consistent with abnormal left ventricular   relaxation (grade 1 diastolic dysfunction). - Aortic valve: Moderately calcified annulus. Trileaflet;   moderately thickened leaflets. There was mild regurgitation.   Valve area (VTI): 2.74 cm^2. Valve area (Vmax): 2.39 cm^2. Valve   area (Vmean): 2.31 cm^2. - Atrial septum: No defect or patent foramen ovale was identified. - Pericardium, extracardiac: There is a small pericardial effusion   adjacent to the inferior LV wall. There is no evidence of   tamponade physiology by echo. - Technically difficult study    ASSESSMENT & PLAN:    1. Syncope  - Last episode possibly due to orthostasis. His rate was in 40s with longest pause of 1.6 second in hospital. Will place on 2 weeks monitor as scheduled.   2. Orthostasis in the setting of history of HTN  - The patient is again orthostatic today in clinic. He was asymptomatic. Advise to completely stop alcohol. Keep hydrated. Get balance before walking. Continue lisinopril at 5mg  qd. His baseline BP of  140/70s. Midodrine contraindicated due to BPH.   Orthostatic VS for the past 24 hrs:  BP- Lying Pulse- Lying BP- Sitting Pulse- Sitting BP- Standing at 0 minutes Pulse- Standing at 0 minutes  01/27/16 1248 154/75 (!) 46 135/77 (!) 48 122/73 61    3. Sinus bradycardia  -  Avoid AVN blocking agents. As above.   4. BPH - Discontinued Flomax during admission. Will be followed by PCP.   5. HLD - Continue statin. Followed by PCP.    The patient prefers to seen by Dr. Meda Coffee.   Medication Adjustments/Labs and Tests Ordered: Current medicines are reviewed at length with the patient today.  Concerns regarding medicines are outlined above.  Medication changes, Labs and Tests ordered today are listed in the Patient Instructions below. Patient Instructions  Medication Instructions:   Your physician recommends that you continue on your current medications as directed. Please refer to the Current Medication list given to you today.   If you need a refill on your cardiac medications before your next appointment, please call your pharmacy.  Labwork:  NONE ORDER TODAY    Testing/Procedures: NONE ORDER TODAY    Follow-Up:  WTH DR NELSON IN 3 WEEKS ( MESSAGE TO  NURSE IVY  GET IN CLINIC TO SEE DR NELSON ONLY IF UNAVAILABLE)   Any Other Special Instructions Will Be Listed Below (If Applicable).  KEEP YOUR SELF  HYDRATED WITH DRINKING GATORADE AND WATER   LIMIT ALCOHOL INTAKE ALSO                                                                                                                                                     Signed,  [place], [place]  01/27/2016 1:18 PM    [facility] [address], [place], [place]  [address] Phone: [phone]; Fax: [phone]

## 2016-01-27 NOTE — Patient Instructions (Addendum)
Medication Instructions:   Your physician recommends that you continue on your current medications as directed. Please refer to the Current Medication list given to you today.   If you need a refill on your cardiac medications before your next appointment, please call your pharmacy.  Labwork:  NONE ORDER TODAY    Testing/Procedures: NONE ORDER TODAY    Follow-Up:  WTH DR NELSON IN 3 WEEKS ( MESSAGE TO  NURSE IVY  GET IN CLINIC TO SEE DR NELSON ONLY IF UNAVAILABLE)   Any Other Special Instructions Will Be Listed Below (If Applicable).  KEEP YOUR SELF  HYDRATED WITH DRINKING GATORADE AND WATER   LIMIT ALCOHOL INTAKE ALSO

## 2016-01-28 ENCOUNTER — Ambulatory Visit (INDEPENDENT_AMBULATORY_CARE_PROVIDER_SITE_OTHER): Payer: Medicare HMO

## 2016-01-28 DIAGNOSIS — R001 Bradycardia, unspecified: Secondary | ICD-10-CM

## 2016-01-28 DIAGNOSIS — R55 Syncope and collapse: Secondary | ICD-10-CM

## 2016-01-30 ENCOUNTER — Encounter: Payer: Self-pay | Admitting: Physician Assistant

## 2016-02-20 ENCOUNTER — Ambulatory Visit: Payer: Medicare HMO | Admitting: Cardiology

## 2016-02-21 ENCOUNTER — Ambulatory Visit: Payer: Medicare HMO | Admitting: Physician Assistant

## 2016-02-25 ENCOUNTER — Ambulatory Visit: Payer: Medicare HMO | Admitting: Physician Assistant

## 2016-03-03 DIAGNOSIS — H353134 Nonexudative age-related macular degeneration, bilateral, advanced atrophic with subfoveal involvement: Secondary | ICD-10-CM | POA: Insufficient documentation

## 2016-03-03 DIAGNOSIS — Z961 Presence of intraocular lens: Secondary | ICD-10-CM | POA: Insufficient documentation

## 2016-03-03 DIAGNOSIS — H43813 Vitreous degeneration, bilateral: Secondary | ICD-10-CM | POA: Insufficient documentation

## 2016-03-03 DIAGNOSIS — H35033 Hypertensive retinopathy, bilateral: Secondary | ICD-10-CM | POA: Insufficient documentation

## 2016-03-04 ENCOUNTER — Encounter: Payer: Self-pay | Admitting: Cardiology

## 2016-03-04 ENCOUNTER — Ambulatory Visit (INDEPENDENT_AMBULATORY_CARE_PROVIDER_SITE_OTHER): Payer: Medicare HMO | Admitting: Cardiology

## 2016-03-04 VITALS — BP 112/66 | HR 52 | Ht 71.0 in | Wt 152.8 lb

## 2016-03-04 DIAGNOSIS — Z8679 Personal history of other diseases of the circulatory system: Secondary | ICD-10-CM

## 2016-03-04 NOTE — Progress Notes (Signed)
03/04/2016 Kevin Page   05-05-1931  [id number]  Primary Physician Marton Redwood, MD Primary Cardiologist: Dr. Meda Coffee   Reason for Visit/CC: F/u for Orthostatic Hypotension and Bradycardia   HPI:  Kevin Page is a 82 y.o. male with hx of HTN, HLD, BPH, OA, macular degeneration, habitual alcohol intake (1 drink of gin daily), former tobacco abuse (44 yrs, quit 1990s), syncope 2014 (event monitor with SB 40s-50s, brief SVT), mild AI and mildly dilated aortic root in 2014 and recently admitted for syncope who presentes for follow up.   He had an episode of syncope in 12/2012 following a stressful day where he hadn't had a lot of fluids. 2D echo 01/12/13: EF 55-60%, grade 1 DD, mild AI, mildly dilated aortic root, mild MR, mild-mod dilated. 48 hour Holter showed episodes of sinus bradycardia with longest pause 1.8 seconds, also with afew episodes of SVT, the longest run lasting 12 beats with rate 128 BPM -> he did have recordings of HR in the 40s at that time. This was felt possibly indicative of SSS, although there was no indication for PPM at that time. He also underwent GXT that did not show chronotropic incompetence.  He was in [place] up until 01/18/16 when he had episode of syncope. Workup showed an 20mmHg drop in BP in ED with 20 beat rise in HR, suggestive of orthostasis. He was treated with IVF. His episode of syncope after standing for prolonged period of time. Telemetry showed bradycardia predominantly during sleep however also at 10 AM with heart rate down to 41. Longest R to R. 1.6 seconds and patient was not symptomatic at that time. His TSH is normal. Echocardiogram today shows normal LVEF, diastolic dysfunction with normal filling pressures, sclerotic aortic valve without any significant stenosis and normal right-sided pressures. Lisinopril and Flomax both were held. Then his BP was elevated and resumed half dose of lisinopril, 5 mg. He was advised to maintain good hydration and discharged  home stable with plan to place 2 weeks monitor.   He presents back for f/u. He is here with his wife. He has done well. No recurrent syncope. He did have a brief episode of near syncope while at church but symptoms quickly passed. He denies CP and dyspnea. Orthostatic VS were checked again in clinic today and he is not orthostatic. He has done ok w/o his Flomax. No difficulties with urination. Cardiac monitor reviewed. Predominant sinus bradycardia with no pause longer > 3 sec. Few PVCs, infrequent PACs. Infrequent very short runs of atrial tachycardia. No other significant arrhythmias. Average HR was 58.   His BP in clinic today is stable. HR in the mid 60s.     Current Meds  Medication Sig  . alendronate (FOSAMAX) 70 MG tablet Take 70 mg by mouth every Thursday. Take with a full glass of water on an empty stomach.  [name] aspirin EC 81 MG tablet Take 81 mg by mouth daily.  . brimonidine (ALPHAGAN) 0.2 % ophthalmic solution Place 1 drop into both eyes at bedtime.  . Cetirizine HCl (ZYRTEC ALLERGY PO) Take by mouth as directed.  . dutasteride (AVODART) 0.5 MG capsule Take 0.5 mg by mouth daily.  . fluticasone (FLONASE) 50 MCG/ACT nasal spray Place 1 spray into both nostrils daily as needed for allergies or rhinitis.  [name] lisinopril (PRINIVIL,ZESTRIL) 5 MG tablet Take 1 tablet (5 mg total) by mouth daily.  . Multiple Vitamins-Minerals ([facility] MACULAR HEALTH PO) Take 2 tablets by mouth daily.  . simvastatin (ZOCOR) 20  MG tablet Take 20 mg by mouth Daily.   . sodium chloride (OCEAN) 0.65 % nasal spray Place 1 spray into the nose as needed for congestion.   . sodium chloride (OCEAN) 0.65 % SOLN nasal spray Place 1 spray into both nostrils at bedtime as needed for congestion.  . [DISCONTINUED] doxycycline (VIBRAMYCIN) 100 MG capsule Take 100 mg by mouth as directed.    No Known Allergies Past Medical History:  Diagnosis Date  . Allergy    dust and mites  . BPH (benign prostatic hyperplasia)   .  Essential hypertension   . Hyperlipidemia   . Macular degeneration   . Osteoarthritis of both knees   . Sinus bradycardia   . SVT (supraventricular tachycardia) ([place])    a. Event monitor 2014: few episodes of SVT, longest 12 beats with rate 128.  [name] Syncope    Family History  Problem Relation Age of Onset  . Cancer Brother     tonsils  . Heart attack Neg Hx    Past Surgical History:  Procedure Laterality Date  . CATARACT EXTRACTION W/ INTRAOCULAR LENS  IMPLANT, BILATERAL Bilateral 2011  . dupytron contractions  1998 , 2006   both hands  . LUMBAR FUSION  1994   Social History   Social History  . Marital status: Married    Spouse name: N/A  . Number of children: N/A  . Years of education: N/A   Occupational History  . Not on file.   Social History Main Topics  . Smoking status: Former Research scientist (life sciences)  . Smokeless tobacco: Never Used     Comment: Smoked for 44 years, quit 1990s  . Alcohol use 6.0 oz/week    12 Standard drinks or equivalent per week     Comment: 1 drink of gin nightly, occasionally more if going out to dinner.  . Drug use: No  . Sexual activity: Not on file   Other Topics Concern  . Not on file   Social History Narrative  . No narrative on file     Review of Systems: General: negative for chills, fever, night sweats or weight changes.  Cardiovascular: negative for chest pain, dyspnea on exertion, edema, orthopnea, palpitations, paroxysmal nocturnal dyspnea or shortness of breath Dermatological: negative for rash Respiratory: negative for cough or wheezing Urologic: negative for hematuria Abdominal: negative for nausea, vomiting, diarrhea, bright red blood per rectum, melena, or hematemesis Neurologic: negative for visual changes, syncope, or dizziness All other systems reviewed and are otherwise negative except as noted above.   Physical Exam:  Blood pressure 112/66, pulse (!) 52, height 5\' 11"  (1.803 m), weight 152 lb 12.8 oz (69.3 kg).  General  appearance: alert, cooperative and no distress Neck: no carotid bruit and no JVD Lungs: clear to auscultation bilaterally Heart: regular rate and rhythm, S1, S2 normal, no murmur, click, rub or gallop Extremities: extremities normal, atraumatic, no cyanosis or edema Pulses: 2+ and symmetric Skin: Skin color, texture, turgor normal. No rashes or lesions Neurologic: Grossly normal  EKG not performed   ASSESSMENT AND PLAN:   1. Syncope: occurred in the setting of orthostatic hypotension. BP meds adjusted. Flomax discontinued. No recurrence in symptoms. Cardiac monitor showed no significant bradycardia and no pauses > 3 sec.   2. Orthostatic Hypotension: in the setting of Flomax and lisinopril. Flomax discontinued. Lisinopril reduced from 10 to 5 mg. He denies any further symptoms. Orthostatic vitals were checked today. He is not orthostatic. We discussed importance of staying well hydrated. Patient advised  to take his time with position changes.   3. Bradycardia: monitor was ok as outlined above. Resting HR in clinic today is in the 57s. Avoid AVN blocking agents.   4. HTN: BP is controlled and stable on low dose lisinopril.   PLAN  F/u with Dr. Meda Coffee in 2-3 months   Lyda Jester PA-C 03/04/2016 11:57 AM

## 2016-03-04 NOTE — Patient Instructions (Signed)
Your physician recommends that you continue on your current medications as directed. Please refer to the Current Medication list given to you today.    Your physician recommends that you schedule a follow-up appointment in: 3 MONTHS WITH DR NELSON  

## 2016-03-06 DIAGNOSIS — N401 Enlarged prostate with lower urinary tract symptoms: Secondary | ICD-10-CM | POA: Diagnosis not present

## 2016-03-06 DIAGNOSIS — R008 Other abnormalities of heart beat: Secondary | ICD-10-CM | POA: Diagnosis not present

## 2016-03-06 DIAGNOSIS — Z87898 Personal history of other specified conditions: Secondary | ICD-10-CM | POA: Diagnosis not present

## 2016-03-06 DIAGNOSIS — I951 Orthostatic hypotension: Secondary | ICD-10-CM | POA: Diagnosis not present

## 2016-03-06 DIAGNOSIS — Z6822 Body mass index (BMI) 22.0-22.9, adult: Secondary | ICD-10-CM | POA: Diagnosis not present

## 2016-03-06 DIAGNOSIS — I10 Essential (primary) hypertension: Secondary | ICD-10-CM | POA: Diagnosis not present

## 2016-04-05 ENCOUNTER — Ambulatory Visit (HOSPITAL_COMMUNITY)
Admission: EM | Admit: 2016-04-05 | Discharge: 2016-04-05 | Disposition: A | Discharge status: Home / Self Care | Payer: Medicare HMO

## 2016-04-05 ENCOUNTER — Encounter (HOSPITAL_COMMUNITY): Payer: Self-pay | Admitting: Emergency Medicine

## 2016-04-05 DIAGNOSIS — H1132 Conjunctival hemorrhage, left eye: Secondary | ICD-10-CM

## 2016-04-05 NOTE — Discharge Instructions (Signed)
If you develop any pain, additional loss of vision go to Emergency Department or contact Dr. Brigitte Pulse immediately.

## 2016-04-05 NOTE — ED Provider Notes (Signed)
CSN: [id number]     Arrival date & time 04/05/16  1409 History   None    Chief Complaint  Patient presents with  . Eye Redness   (Consider location/radiation/quality/duration/timing/severity/associated sxs/prior Treatment)  HPI   The patient is an 82 year old male with a history of macular degeneration and cataract surgery presenting today for redness to the left eye. Patient denies any significant changes in his vision. States his vision is always "poor". States he does not have any pain or discomfort in the eye or in his temple; nor has there been any significant change in his vision.  Denies any known injury, or hard coughing or sneezing.  Takes a baby ASA every other day. States this has happened before and they have told him not to be concerned and it resolved on its own.   Past Medical History:  Diagnosis Date  . Allergy    dust and mites  . BPH (benign prostatic hyperplasia)   . Essential hypertension   . Hyperlipidemia   . Macular degeneration   . Osteoarthritis of both knees   . Sinus bradycardia   . SVT (supraventricular tachycardia) ([place])    a. Event monitor 2014: few episodes of SVT, longest 12 beats with rate 128.  [name] Syncope    Past Surgical History:  Procedure Laterality Date  . CATARACT EXTRACTION W/ INTRAOCULAR LENS  IMPLANT, BILATERAL Bilateral 2011  . dupytron contractions  1998 , 2006   both hands  . LUMBAR FUSION  1994   Family History  Problem Relation Age of Onset  . Cancer Brother     tonsils  . Heart attack Neg Hx    Social History  Substance Use Topics  . Smoking status: Former Research scientist (life sciences)  . Smokeless tobacco: Never Used     Comment: Smoked for 44 years, quit 1990s  . Alcohol use 6.0 oz/week    12 Standard drinks or equivalent per week     Comment: 1 drink of gin nightly, occasionally more if going out to dinner.    Review of Systems  Constitutional: Negative.  Negative for fatigue and fever.  HENT: Negative.  Negative for facial swelling.     Eyes: Positive for redness. Negative for photophobia, pain, discharge, itching and visual disturbance.  Respiratory: Negative.  Negative for cough and shortness of breath.   Cardiovascular: Negative.   Gastrointestinal: Negative.  Negative for diarrhea, nausea and vomiting.  Endocrine: Negative.   Genitourinary: Negative.   Musculoskeletal: Negative.   Skin: Negative.   Allergic/Immunologic: Negative.   Neurological: Negative.  Negative for dizziness, facial asymmetry, weakness, light-headedness and headaches.  Hematological: Negative.   Psychiatric/Behavioral: Negative.     Allergies  Patient has no known allergies.  Home Medications   Prior to Admission medications   Medication Sig Start Date End Date Taking? Authorizing Provider  alendronate (FOSAMAX) 70 MG tablet Take 70 mg by mouth every Thursday. Take with a full glass of water on an empty stomach.   Yes Historical Provider, MD  aspirin EC 81 MG tablet Take 81 mg by mouth daily.   Yes Historical Provider, MD  brimonidine (ALPHAGAN) 0.2 % ophthalmic solution Place 1 drop into both eyes at bedtime. 11/14/15  Yes Historical Provider, MD  dutasteride (AVODART) 0.5 MG capsule Take 0.5 mg by mouth daily.   Yes Historical Provider, MD  lisinopril (PRINIVIL,ZESTRIL) 5 MG tablet Take 1 tablet (5 mg total) by mouth daily. 01/20/16  Yes Thurnell Lose, MD  Multiple Vitamins-Minerals ([place])  Take 2 tablets by mouth daily.   Yes Historical Provider, MD  simvastatin (ZOCOR) 20 MG tablet Take 20 mg by mouth Daily.  10/19/11  Yes Historical Provider, MD  Cetirizine HCl (ZYRTEC ALLERGY PO) Take by mouth as directed.    Historical Provider, MD  fluticasone (FLONASE) 50 MCG/ACT nasal spray Place 1 spray into both nostrils daily as needed for allergies or rhinitis.    Historical Provider, MD  sodium chloride (OCEAN) 0.65 % nasal spray Place 1 spray into the nose as needed for congestion.     Historical Provider, MD  sodium chloride  (OCEAN) 0.65 % SOLN nasal spray Place 1 spray into both nostrils at bedtime as needed for congestion.    Historical Provider, MD   Meds Ordered and Administered this Visit  Medications - No data to display  BP (!) 142/51 (BP Location: Left Arm)   Pulse (!) 47   Temp 98.4 F (36.9 C) (Oral)   Resp 16   SpO2 99%  No data found.   Physical Exam  Constitutional: He appears well-developed and well-nourished. No distress.  Eyes: EOM and lids are normal. Pupils are equal, round, and reactive to light. Right eye exhibits no discharge and no exudate. Left eye exhibits no discharge and no exudate. Right conjunctiva is not injected. Right conjunctiva has no hemorrhage. Left conjunctiva is not injected. Left conjunctiva has a hemorrhage. No scleral icterus. Right eye exhibits normal extraocular motion and no nystagmus. Left eye exhibits normal extraocular motion and no nystagmus.    Cardiovascular: Regular rhythm, normal heart sounds and intact distal pulses.  Exam reveals no gallop and no friction rub.   No murmur heard. Patient is bradycardic. Conducive with his cardiac history.  Pulmonary/Chest: Effort normal and breath sounds normal. No respiratory distress. He has no wheezes. He has no rales. He exhibits no tenderness.  Skin: Skin is warm and dry. He is not diaphoretic.  Nursing note and vitals reviewed.   Urgent Care Course   Clinical Course     Procedures (including critical care time)  Labs Review Labs Reviewed - No data to display  Imaging Review No results found. The patient to follow up with his Eye physician tomorrow and advise was seen here today.  To go to ED if any sudden visual changes or pain develops.    MDM   1. Subconjunctival bleed, left    The usual and customary discharge instructions and warnings were given.  The patient verbalizes understanding and agrees to plan of care.       Nehemiah Settle, NP 04/05/16 1713

## 2016-04-05 NOTE — ED Triage Notes (Signed)
Patient presents with wife to [facility] today, he has a complaint of redness to his right eye, no pain. States the redness started yesterday and has worsened. No itching, no burning.

## 2016-04-16 ENCOUNTER — Ambulatory Visit (INDEPENDENT_AMBULATORY_CARE_PROVIDER_SITE_OTHER): Payer: Medicare HMO | Admitting: Podiatry

## 2016-04-16 DIAGNOSIS — M79676 Pain in unspecified toe(s): Secondary | ICD-10-CM | POA: Diagnosis not present

## 2016-04-16 DIAGNOSIS — B351 Tinea unguium: Secondary | ICD-10-CM | POA: Diagnosis not present

## 2016-04-16 NOTE — Progress Notes (Signed)
Subjective:     Patient ID: Kevin Page, male   DOB: 12/09/1930, 82 y.o.   MRN: [id number]  HPI patient presents with thick yellow brittle nailbeds 1-5 both feet that he cannot cut and are painful   Review of Systems     Objective:   Physical Exam Incurvated nails that are thick yellow brittle and painful    Assessment:     Mycotic nail infection with pain 1-5 both feet    Plan:     Debris painful nailbeds 1-5 both feet with no iatrogenic bleeding noted

## 2016-04-20 DIAGNOSIS — R69 Illness, unspecified: Secondary | ICD-10-CM | POA: Diagnosis not present

## 2016-06-02 DIAGNOSIS — R222 Localized swelling, mass and lump, trunk: Secondary | ICD-10-CM | POA: Diagnosis not present

## 2016-06-02 DIAGNOSIS — R69 Illness, unspecified: Secondary | ICD-10-CM | POA: Diagnosis not present

## 2016-06-02 DIAGNOSIS — E7800 Pure hypercholesterolemia, unspecified: Secondary | ICD-10-CM | POA: Diagnosis not present

## 2016-06-08 ENCOUNTER — Encounter: Payer: Self-pay | Admitting: Cardiology

## 2016-06-08 ENCOUNTER — Encounter: Payer: Self-pay | Admitting: *Deleted

## 2016-06-08 ENCOUNTER — Ambulatory Visit (INDEPENDENT_AMBULATORY_CARE_PROVIDER_SITE_OTHER): Payer: Medicare HMO | Admitting: Cardiology

## 2016-06-08 VITALS — BP 140/70 | HR 60 | Ht 71.0 in | Wt 151.0 lb

## 2016-06-08 DIAGNOSIS — R55 Syncope and collapse: Secondary | ICD-10-CM

## 2016-06-08 DIAGNOSIS — Z8679 Personal history of other diseases of the circulatory system: Secondary | ICD-10-CM | POA: Diagnosis not present

## 2016-06-08 DIAGNOSIS — R001 Bradycardia, unspecified: Secondary | ICD-10-CM

## 2016-06-08 NOTE — Patient Instructions (Signed)
Medication Instructions:  The current medical regimen is effective;  continue present plan and medications.  Follow-Up: Follow up in 3 months with Dr Meda Coffee.  If you need a refill on your cardiac medications before your next appointment, please call your pharmacy.  Thank you for choosing [facility]!!

## 2016-06-08 NOTE — Progress Notes (Signed)
06/08/2016 Kevin Page   1931-05-05  [id number]  Primary Physician Marton Redwood, MD Primary Cardiologist: Dr. Meda Coffee   Reason for Visit/CC: F/u for Orthostatic Hypotension and Bradycardia   HPI:  Kevin Page is a 83 y.o. male with hx of HTN, HLD, BPH, OA, macular degeneration, habitual alcohol intake (1 drink of gin daily), former tobacco abuse (44 yrs, quit 1990s), syncope 2014 (event monitor with SB 40s-50s, brief SVT), mild AI and mildly dilated aortic root in 2014 and recently admitted for syncope who presentes for follow up.   He had an episode of syncope in 12/2012 following a stressful day where he hadn't had a lot of fluids. 2D echo 01/12/13: EF 55-60%, grade 1 DD, mild AI, mildly dilated aortic root, mild MR, mild-mod dilated. 48 hour Holter showed episodes of sinus bradycardia with longest pause 1.8 seconds, also with afew episodes of SVT, the longest run lasting 12 beats with rate 128 BPM -> he did have recordings of HR in the 40s at that time. This was felt possibly indicative of SSS, although there was no indication for PPM at that time. He also underwent GXT that did not show chronotropic incompetence.  He was in [place] up until 01/18/16 when he had episode of syncope. Workup showed an 31mmHg drop in BP in ED with 20 beat rise in HR, suggestive of orthostasis. He was treated with IVF. His episode of syncope after standing for prolonged period of time. Telemetry showed bradycardia predominantly during sleep however also at 10 AM with heart rate down to 41. Longest R to R. 1.6 seconds and patient was not symptomatic at that time. His TSH is normal. Echocardiogram today shows normal LVEF, diastolic dysfunction with normal filling pressures, sclerotic aortic valve without any significant stenosis and normal right-sided pressures. Lisinopril and Flomax both were held. Then his BP was elevated and resumed half dose of lisinopril, 5 mg. He was advised to maintain good hydration and discharged  home stable with plan to place 2 weeks monitor.   He presents back for f/u. He is here with his wife. He has done well. No recurrent syncope. He did have a brief episode of near syncope while at church but symptoms quickly passed. He denies CP and dyspnea. Orthostatic VS were checked again in clinic today and he is not orthostatic. He has done ok w/o his Flomax. No difficulties with urination. Cardiac monitor reviewed. Predominant sinus bradycardia with no pause longer > 3 sec. Few PVCs, infrequent PACs. Infrequent very short runs of atrial tachycardia. No other significant arrhythmias. Average HR was 58.   06/08/2016 - 3 months follow-up, the patient states that since the last visit he has had 2 episodes of presyncope, one of them in short while he was sitting for prolonged period of time, but then when he tried to move around his symptoms resolved. He is trying to hydrate well, he continues to take lisinopril 5 mg daily. He has purchased EKG monitor for his iPhone but doesn't have any recordings.  Current Meds  Medication Sig  . alendronate (FOSAMAX) 70 MG tablet Take 70 mg by mouth every Thursday. Take with a full glass of water on an empty stomach.  [name] aspirin EC 81 MG tablet Take 81 mg by mouth daily.  . brimonidine (ALPHAGAN) 0.2 % ophthalmic solution Place 1 drop into both eyes at bedtime.  . Cetirizine HCl (ZYRTEC ALLERGY PO) Take by mouth as directed.  . dutasteride (AVODART) 0.5 MG capsule Take 0.5 mg by mouth  daily.  . lisinopril (PRINIVIL,ZESTRIL) 5 MG tablet Take 1 tablet (5 mg total) by mouth daily.  . Multiple Vitamins-Minerals (MH MACULAR HEALTH PO) Take 2 tablets by mouth daily.  . simvastatin (ZOCOR) 20 MG tablet Take 20 mg by mouth Daily.   . sodium chloride (OCEAN) 0.65 % SOLN nasal spray Place 1 spray into both nostrils at bedtime as needed for congestion.   No Known Allergies Past Medical History:  Diagnosis Date  . Allergy    dust and mites  . BPH (benign prostatic  hyperplasia)   . Essential hypertension   . Hyperlipidemia   . Macular degeneration   . Osteoarthritis of both knees   . Sinus bradycardia   . SVT (supraventricular tachycardia) ([place])    a. Event monitor 2014: few episodes of SVT, longest 12 beats with rate 128.  [name] Syncope    Family History  Problem Relation Age of Onset  . Cancer Brother     tonsils  . Heart attack Neg Hx    Past Surgical History:  Procedure Laterality Date  . CATARACT EXTRACTION W/ INTRAOCULAR LENS  IMPLANT, BILATERAL Bilateral 2011  . dupytron contractions  1998 , 2006   both hands  . LUMBAR FUSION  1994   Social History   Social History  . Marital status: Married    Spouse name: N/A  . Number of children: N/A  . Years of education: N/A   Occupational History  . Not on file.   Social History Main Topics  . Smoking status: Former Research scientist (life sciences)  . Smokeless tobacco: Never Used     Comment: Smoked for 44 years, quit 1990s  . Alcohol use 6.0 oz/week    12 Standard drinks or equivalent per week     Comment: 1 drink of gin nightly, occasionally more if going out to dinner.  . Drug use: No  . Sexual activity: Not on file   Other Topics Concern  . Not on file   Social History Narrative  . No narrative on file     Review of Systems: General: negative for chills, fever, night sweats or weight changes.  Cardiovascular: negative for chest pain, dyspnea on exertion, edema, orthopnea, palpitations, paroxysmal nocturnal dyspnea or shortness of breath Dermatological: negative for rash Respiratory: negative for cough or wheezing Urologic: negative for hematuria Abdominal: negative for nausea, vomiting, diarrhea, bright red blood per rectum, melena, or hematemesis Neurologic: negative for visual changes, syncope, or dizziness All other systems reviewed and are otherwise negative except as noted above.   Physical Exam:  Blood pressure 140/70, pulse 60, height 5\' 11"  (1.803 m), weight 151 lb (68.5 kg).    General appearance: alert, cooperative and no distress Neck: no carotid bruit and no JVD Lungs: clear to auscultation bilaterally Heart: regular rate and rhythm, S1, S2 normal, no murmur, click, rub or gallop Extremities: extremities normal, atraumatic, no cyanosis or edema Pulses: 2+ and symmetric Skin: Skin color, texture, turgor normal. No rashes or lesions Neurologic: Grossly normal  EKG not performed     ASSESSMENT AND PLAN:   1. Syncope: occurred in the setting of orthostatic hypotension. BP meds adjusted. Flomax discontinued. No recurrence in symptoms. Cardiac monitor showed no significant bradycardia and no pauses > 3 sec. he is advised to continue hydrated well, avoid sitting or standing for prolonged time and try to exercise with flexor take short walk. He is advised to call us if he is any recurrent symptoms. We would consider a loop monitor.  2. Orthostatic  Hypotension: in the setting of Flomax and lisinopril. Flomax discontinued. Lisinopril reduced from 10 to 5 mg. He denies any further symptoms. Orthostatic vitals were checked at the last visit, he was not orthostatic. We discussed importance of staying well hydrated. Patient advised to take his time with position changes.   3. Bradycardia: monitor was ok as outlined above. Resting HR in clinic today is in the 1s. Avoid AVN blocking agents.   4. HTN: borderline on low dose lisinopril. I will continue the same dose.  PLAN  F/u with Dr. Meda Coffee in 3 months   Ena Dawley , MD 06/08/2016 9:31 AM

## 2016-07-17 ENCOUNTER — Other Ambulatory Visit: Payer: Medicare HMO

## 2016-07-22 ENCOUNTER — Ambulatory Visit (INDEPENDENT_AMBULATORY_CARE_PROVIDER_SITE_OTHER): Payer: Medicare HMO | Admitting: Podiatry

## 2016-07-22 ENCOUNTER — Encounter: Payer: Self-pay | Admitting: Podiatry

## 2016-07-22 DIAGNOSIS — B351 Tinea unguium: Secondary | ICD-10-CM | POA: Diagnosis not present

## 2016-07-22 DIAGNOSIS — M79676 Pain in unspecified toe(s): Secondary | ICD-10-CM

## 2016-07-22 DIAGNOSIS — M79674 Pain in right toe(s): Secondary | ICD-10-CM | POA: Diagnosis not present

## 2016-07-22 DIAGNOSIS — M79675 Pain in left toe(s): Secondary | ICD-10-CM

## 2016-07-25 NOTE — Progress Notes (Signed)
Subjective:     Patient ID: [name], male   DOB: [birth year], 83 y.o.   MRN: [id number]  HPI patient presents stating I have nail disease 1-5 both feet that are thick yellow and brittle   Review of Systems     Objective:   Physical Exam Neurovascular status intact negative Homans sign was noted with patient found to have yellow brittle nails 1-5 both feet that are painful    Assessment:     Mycotic nail infection with pain 1-5 both feet    Plan:     Debris painful nailbeds 1-5 both feet with no iatrogenic bleeding noted

## 2016-08-14 ENCOUNTER — Ambulatory Visit (INDEPENDENT_AMBULATORY_CARE_PROVIDER_SITE_OTHER): Payer: Medicare HMO | Admitting: Cardiology

## 2016-08-14 ENCOUNTER — Encounter: Payer: Self-pay | Admitting: Cardiology

## 2016-08-14 VITALS — BP 118/64 | HR 72 | Ht 71.0 in | Wt 150.0 lb

## 2016-08-14 DIAGNOSIS — E782 Mixed hyperlipidemia: Secondary | ICD-10-CM | POA: Diagnosis not present

## 2016-08-14 DIAGNOSIS — I10 Essential (primary) hypertension: Secondary | ICD-10-CM | POA: Diagnosis not present

## 2016-08-14 DIAGNOSIS — Z8679 Personal history of other diseases of the circulatory system: Secondary | ICD-10-CM

## 2016-08-14 DIAGNOSIS — R001 Bradycardia, unspecified: Secondary | ICD-10-CM

## 2016-08-14 DIAGNOSIS — E785 Hyperlipidemia, unspecified: Secondary | ICD-10-CM | POA: Diagnosis not present

## 2016-08-14 MED ORDER — LISINOPRIL 2.5 MG PO TABS: 2.5000 mg | ORAL_TABLET | Freq: Every day | ORAL | 2 refills | Status: DC

## 2016-08-14 NOTE — Progress Notes (Addendum)
08/16/2016 Kevin Page   1930-09-19  [id number]  Primary Physician Marton Redwood, MD Primary Cardiologist: Dr. Meda Coffee   Reason for Visit/CC: F/u for Orthostatic Hypotension and Bradycardia   HPI:  Kevin Page is a 83 y.o. male with hx of HTN, HLD, BPH, OA, macular degeneration, habitual alcohol intake (1 drink of gin daily), former tobacco abuse (44 yrs, quit 1990s), syncope 2014 (event monitor with SB 40s-50s, brief SVT), mild AI and mildly dilated aortic root in 2014 and recently admitted for syncope who presentes for follow up.   He had an episode of syncope in 12/2012 following a stressful day where he hadn't had a lot of fluids. 2D echo 01/12/13: EF 55-60%, grade 1 DD, mild AI, mildly dilated aortic root, mild MR, mild-mod dilated. 48 hour Holter showed episodes of sinus bradycardia with longest pause 1.8 seconds, also with afew episodes of SVT, the longest run lasting 12 beats with rate 128 BPM -> he did have recordings of HR in the 40s at that time. This was felt possibly indicative of SSS, although there was no indication for PPM at that time. He also underwent GXT that did not show chronotropic incompetence.  He was in [place] up until 01/18/16 when he had episode of syncope. Workup showed an 13mmHg drop in BP in ED with 20 beat rise in HR, suggestive of orthostasis. He was treated with IVF. His episode of syncope after standing for prolonged period of time. Telemetry showed bradycardia predominantly during sleep however also at 10 AM with heart rate down to 41. Longest R to R. 1.6 seconds and patient was not symptomatic at that time. His TSH is normal. Echocardiogram today shows normal LVEF, diastolic dysfunction with normal filling pressures, sclerotic aortic valve without any significant stenosis and normal right-sided pressures. Lisinopril and Flomax both were held. Then his BP was elevated and resumed half dose of lisinopril, 5 mg. He was advised to maintain good hydration and discharged  home stable with plan to place 2 weeks monitor.   He presents back for f/u. He is here with his wife. He has done well. No recurrent syncope. He did have a brief episode of near syncope while at church but symptoms quickly passed. He denies CP and dyspnea. Orthostatic VS were checked again in clinic today and he is not orthostatic. He has done ok w/o his Flomax. No difficulties with urination. Cardiac monitor reviewed. Predominant sinus bradycardia with no pause longer > 3 sec. Few PVCs, infrequent PACs. Infrequent very short runs of atrial tachycardia. No other significant arrhythmias. Average HR was 58.   06/08/2016 - 3 months follow-up, the patient states that since the last visit he has had 2 episodes of presyncope, one of them in short while he was sitting for prolonged period of time, but then when he tried to move around his symptoms resolved. He is trying to hydrate well, he continues to take lisinopril 5 mg daily. He has purchased EKG monitor for his iPhone but doesn't have any recordings.  08/14/2016 - 2 months follow up, symptoms of dizziness have improved, but are still present. No more presyncope or syncope. The patient's wife noticed memory problems such as forgetting to take shower or brushing his teeth. Denis chest pain, LE edema , pND or orthopnea.    Current Meds  Medication Sig  . alendronate (FOSAMAX) 70 MG tablet Take 70 mg by mouth every Thursday. Take with a full glass of water on an empty stomach.  [name] aspirin EC 81  MG tablet Take 81 mg by mouth daily.  . brimonidine (ALPHAGAN) 0.2 % ophthalmic solution Place 1 drop into both eyes at bedtime.  . dutasteride (AVODART) 0.5 MG capsule Take 0.5 mg by mouth daily.  . fluticasone (FLONASE) 50 MCG/ACT nasal spray Place 1 spray into both nostrils daily as needed for allergies or rhinitis.  . Multiple Vitamins-Minerals (MH MACULAR HEALTH PO) Take 2 tablets by mouth daily.  . sodium chloride (OCEAN) 0.65 % SOLN nasal spray Place 1 spray  into both nostrils at bedtime as needed for congestion.  . [DISCONTINUED] lisinopril (PRINIVIL,ZESTRIL) 5 MG tablet Take 1 tablet (5 mg total) by mouth daily.  . [DISCONTINUED] simvastatin (ZOCOR) 20 MG tablet Take 20 mg by mouth Daily.    No Known Allergies Past Medical History:  Diagnosis Date  . Allergy    dust and mites  . BPH (benign prostatic hyperplasia)   . Essential hypertension   . Hyperlipidemia   . Macular degeneration   . Osteoarthritis of both knees   . Sinus bradycardia   . SVT (supraventricular tachycardia) ([place])    a. Event monitor 2014: few episodes of SVT, longest 12 beats with rate 128.  [name] Syncope    Family History  Problem Relation Age of Onset  . Cancer Brother     tonsils  . Heart attack Neg Hx    Past Surgical History:  Procedure Laterality Date  . CATARACT EXTRACTION W/ INTRAOCULAR LENS  IMPLANT, BILATERAL Bilateral 2011  . dupytron contractions  1998 , 2006   both hands  . LUMBAR FUSION  1994   Social History   Social History  . Marital status: Married    Spouse name: N/A  . Number of children: N/A  . Years of education: N/A   Occupational History  . Not on file.   Social History Main Topics  . Smoking status: Former Research scientist (life sciences)  . Smokeless tobacco: Never Used     Comment: Smoked for 44 years, quit 1990s  . Alcohol use 6.0 oz/week    12 Standard drinks or equivalent per week     Comment: 1 drink of gin nightly, occasionally more if going out to dinner.  . Drug use: No  . Sexual activity: Not on file   Other Topics Concern  . Not on file   Social History Narrative  . No narrative on file     Review of Systems: General: negative for chills, fever, night sweats or weight changes.  Cardiovascular: negative for chest pain, dyspnea on exertion, edema, orthopnea, palpitations, paroxysmal nocturnal dyspnea or shortness of breath Dermatological: negative for rash Respiratory: negative for cough or wheezing Urologic: negative for  hematuria Abdominal: negative for nausea, vomiting, diarrhea, bright red blood per rectum, melena, or hematemesis Neurologic: negative for visual changes, syncope, or dizziness All other systems reviewed and are otherwise negative except as noted above.   Physical Exam:  Blood pressure 118/64, pulse 72, height 5\' 11"  (1.803 m), weight 150 lb (68 kg).  General appearance: alert, cooperative and no distress Neck: no carotid bruit and no JVD Lungs: clear to auscultation bilaterally Heart: regular rate and rhythm, S1, S2 normal, no murmur, click, rub or gallop Extremities: extremities normal, atraumatic, no cyanosis or edema Pulses: 2+ and symmetric Skin: Skin color, texture, turgor normal. No rashes or lesions Neurologic: Grossly normal  EKG performed on 08/14/16 - personally reviewed, shows ST, otherwise normal ECG, unchanged from prior.    ASSESSMENT AND PLAN:   1. Syncope: occurred in the  setting of orthostatic hypotension. BP meds adjusted. Flomax discontinued. No recurrence of falls but still some orthostatic hypotension.. Cardiac monitor showed no significant bradycardia and no pauses > 3 sec. We will further discontinue lisinopril to 2.5 mg po daily.  2. Orthostatic Hypotension: in the setting of Flomax and lisinopril. Changes as above.  3. Bradycardia: monitor was ok as outlined above. Resting HR in clinic today is in the 31s. Avoid AVN blocking agents.   4. HTN: as above. We wil monitor after adjustment of BP meds.  5. Memory loss - d/c simvastatin.   PLAN  F/u with Dr. Meda Coffee in 3 months   Ena Dawley , MD 08/16/2016 2:47 PM

## 2016-08-14 NOTE — Patient Instructions (Signed)
Medication Instructions:   STOP TAKING SIMVASTATIN NOW  DECREASE YOUR LISINOPRIL TO 2.5 MG ONCE DAILY     Follow-Up:  3 MONTHS WITH DR Meda Coffee       If you need a refill on your cardiac medications before your next appointment, please call your pharmacy.

## 2016-08-24 DIAGNOSIS — H543 Unqualified visual loss, both eyes: Secondary | ICD-10-CM | POA: Diagnosis not present

## 2016-08-24 DIAGNOSIS — H353134 Nonexudative age-related macular degeneration, bilateral, advanced atrophic with subfoveal involvement: Secondary | ICD-10-CM | POA: Diagnosis not present

## 2016-10-20 DIAGNOSIS — R222 Localized swelling, mass and lump, trunk: Secondary | ICD-10-CM | POA: Diagnosis not present

## 2016-10-20 DIAGNOSIS — R69 Illness, unspecified: Secondary | ICD-10-CM | POA: Diagnosis not present

## 2016-10-20 DIAGNOSIS — E7800 Pure hypercholesterolemia, unspecified: Secondary | ICD-10-CM | POA: Diagnosis not present

## 2016-10-22 DIAGNOSIS — H543 Unqualified visual loss, both eyes: Secondary | ICD-10-CM | POA: Diagnosis not present

## 2016-10-29 ENCOUNTER — Encounter: Payer: Self-pay | Admitting: Cardiology

## 2016-11-12 DIAGNOSIS — R222 Localized swelling, mass and lump, trunk: Secondary | ICD-10-CM | POA: Diagnosis not present

## 2016-11-12 DIAGNOSIS — E7800 Pure hypercholesterolemia, unspecified: Secondary | ICD-10-CM | POA: Diagnosis not present

## 2016-11-12 DIAGNOSIS — R69 Illness, unspecified: Secondary | ICD-10-CM | POA: Diagnosis not present

## 2016-11-13 ENCOUNTER — Ambulatory Visit (INDEPENDENT_AMBULATORY_CARE_PROVIDER_SITE_OTHER): Payer: Medicare HMO | Admitting: Cardiology

## 2016-11-13 VITALS — BP 124/64 | HR 67 | Ht 71.0 in | Wt 150.0 lb

## 2016-11-13 DIAGNOSIS — Z8679 Personal history of other diseases of the circulatory system: Secondary | ICD-10-CM | POA: Diagnosis not present

## 2016-11-13 DIAGNOSIS — I10 Essential (primary) hypertension: Secondary | ICD-10-CM | POA: Diagnosis not present

## 2016-11-13 DIAGNOSIS — R001 Bradycardia, unspecified: Secondary | ICD-10-CM | POA: Diagnosis not present

## 2016-11-13 DIAGNOSIS — R55 Syncope and collapse: Secondary | ICD-10-CM

## 2016-11-13 NOTE — Progress Notes (Signed)
11/13/2016 Kevin Page   December 09, 1930  [id number]  Primary Physician Marton Redwood, MD Primary Cardiologist: Dr. Meda Coffee   Reason for Visit/CC: F/u for Orthostatic Hypotension and Bradycardia   HPI:  Kevin Page is a 83 y.o. male with hx of HTN, HLD, BPH, OA, macular degeneration, habitual alcohol intake (1 drink of gin daily), former tobacco abuse (44 yrs, quit 1990s), syncope 2014 (event monitor with SB 40s-50s, brief SVT), mild AI and mildly dilated aortic root in 2014 and recently admitted for syncope who presentes for follow up.   He had an episode of syncope in 12/2012 following a stressful day where he hadn't had a lot of fluids. 2D echo 01/12/13: EF 55-60%, grade 1 DD, mild AI, mildly dilated aortic root, mild MR, mild-mod dilated. 48 hour Holter showed episodes of sinus bradycardia with longest pause 1.8 seconds, also with afew episodes of SVT, the longest run lasting 12 beats with rate 128 BPM -> he did have recordings of HR in the 40s at that time. This was felt possibly indicative of SSS, although there was no indication for PPM at that time. He also underwent GXT that did not show chronotropic incompetence.  He was in [place] up until 01/18/16 when he had episode of syncope. Workup showed an 72mmHg drop in BP in ED with 20 beat rise in HR, suggestive of orthostasis. He was treated with IVF. His episode of syncope after standing for prolonged period of time. Telemetry showed bradycardia predominantly during sleep however also at 10 AM with heart rate down to 41. Longest R to R. 1.6 seconds and patient was not symptomatic at that time. His TSH is normal. Echocardiogram today shows normal LVEF, diastolic dysfunction with normal filling pressures, sclerotic aortic valve without any significant stenosis and normal right-sided pressures. Lisinopril and Flomax both were held. Then his BP was elevated and resumed half dose of lisinopril, 5 mg. He was advised to maintain good hydration and discharged  home stable with plan to place 2 weeks monitor.   He presents back for f/u. He is here with his wife. He has done well. No recurrent syncope. He did have a brief episode of near syncope while at church but symptoms quickly passed. He denies CP and dyspnea. Orthostatic VS were checked again in clinic today and he is not orthostatic. He has done ok w/o his Flomax. No difficulties with urination. Cardiac monitor reviewed. Predominant sinus bradycardia with no pause longer > 3 sec. Few PVCs, infrequent PACs. Infrequent very short runs of atrial tachycardia. No other significant arrhythmias. Average HR was 58.   06/08/2016 - 3 months follow-up, the patient states that since the last visit he has had 2 episodes of presyncope, one of them in short while he was sitting for prolonged period of time, but then when he tried to move around his symptoms resolved. He is trying to hydrate well, he continues to take lisinopril 5 mg daily. He has purchased EKG monitor for his iPhone but doesn't have any recordings.  08/14/2016 - 2 months follow up, symptoms of dizziness have improved, but are still present. No more presyncope or syncope. The patient's wife noticed memory problems such as forgetting to take shower or brushing his teeth. Denis chest pain, LE edema , pND or orthopnea.   11/13/2016 - this is a 3 months follow-up, the patient feels well and denies any dizziness presyncope or syncope. His biggest problem is his macular degeneration is currently legally blind. He has found several aids that  are helping him cope with daily life such as reading 2 application can read newspaper for him or scanned document. His wife has noticed that he has been more forgetful in lost his way on several occasions.  Current Meds  Medication Sig  . alendronate (FOSAMAX) 70 MG tablet Take 70 mg by mouth every Thursday. Take with a full glass of water on an empty stomach.  [name] aspirin EC 81 MG tablet Take 81 mg by mouth daily.  .  brimonidine (ALPHAGAN) 0.2 % ophthalmic solution Place 1 drop into both eyes at bedtime.  . dutasteride (AVODART) 0.5 MG capsule Take 0.5 mg by mouth daily.  . fluticasone (FLONASE) 50 MCG/ACT nasal spray Place 1 spray into both nostrils daily as needed for allergies or rhinitis.  [name] lisinopril (PRINIVIL,ZESTRIL) 2.5 MG tablet Take 1 tablet (2.5 mg total) by mouth daily.  . Multiple Vitamins-Minerals (ICAPS MV PO) Take 2 capsules by mouth daily.  . sodium chloride (OCEAN) 0.65 % SOLN nasal spray Place 1 spray into both nostrils at bedtime as needed for congestion.   No Known Allergies Past Medical History:  Diagnosis Date  . Allergy    dust and mites  . BPH (benign prostatic hyperplasia)   . Essential hypertension   . Hyperlipidemia   . Macular degeneration   . Osteoarthritis of both knees   . Sinus bradycardia   . SVT (supraventricular tachycardia) ([place])    a. Event monitor 2014: few episodes of SVT, longest 12 beats with rate 128.  [name] Syncope    Family History  Problem Relation Age of Onset  . Cancer Brother        tonsils  . Heart attack Neg Hx    Past Surgical History:  Procedure Laterality Date  . CATARACT EXTRACTION W/ INTRAOCULAR LENS  IMPLANT, BILATERAL Bilateral 2011  . dupytron contractions  1998 , 2006   both hands  . LUMBAR FUSION  1994   Social History   Social History  . Marital status: Married    Spouse name: N/A  . Number of children: N/A  . Years of education: N/A   Occupational History  . Not on file.   Social History Main Topics  . Smoking status: Former Research scientist (life sciences)  . Smokeless tobacco: Never Used     Comment: Smoked for 44 years, quit 1990s  . Alcohol use 6.0 oz/week    12 Standard drinks or equivalent per week     Comment: 1 drink of gin nightly, occasionally more if going out to dinner.  . Drug use: No  . Sexual activity: Not on file   Other Topics Concern  . Not on file   Social History Narrative  . No narrative on file     Review of  Systems: General: negative for chills, fever, night sweats or weight changes.  Cardiovascular: negative for chest pain, dyspnea on exertion, edema, orthopnea, palpitations, paroxysmal nocturnal dyspnea or shortness of breath Dermatological: negative for rash Respiratory: negative for cough or wheezing Urologic: negative for hematuria Abdominal: negative for nausea, vomiting, diarrhea, bright red blood per rectum, melena, or hematemesis Neurologic: negative for visual changes, syncope, or dizziness All other systems reviewed and are otherwise negative except as noted above.   Physical Exam:  Blood pressure 124/64, pulse 67, height 5\' 11"  (1.803 m), weight 150 lb (68 kg), SpO2 98 %.  General appearance: alert, cooperative and no distress Neck: no carotid bruit and no JVD Lungs: clear to auscultation bilaterally Heart: regular rate and rhythm, S1,  S2 normal, no murmur, click, rub or gallop Extremities: extremities normal, atraumatic, no cyanosis or edema Pulses: 2+ and symmetric Skin: Skin color, texture, turgor normal. No rashes or lesions Neurologic: Grossly normal  EKG performed on 08/14/16 - personally reviewed, shows ST, otherwise normal ECG, unchanged from prior.    ASSESSMENT AND PLAN:   1. Syncope: occurred in the setting of orthostatic hypotension. BP meds adjusted. Flomax discontinued. No recurrence of falls but still some orthostatic hypotension.. Cardiac monitor showed no significant bradycardia and no pauses > 3 sec. At this point I will discontinue lisinopril altogether.  2. Orthostatic Hypotension: in the setting of Flomax and lisinopril. Changes as above.  3. Bradycardia: monitor was ok as outlined above. Resting HR in clinic today is in the 52s. Avoid AVN blocking agents.   4. HTN: as above. We wil monitor after adjustment of BP meds.  5. Memory loss - we discontinued simvastatin with no significant improvement, advised to follow with his primary care physician for  trial of medication such Aricept and Namenda.   PLAN  F/u with Dr. Meda Coffee in 6 months   Ena Dawley , MD 11/13/2016 9:40 AM

## 2016-11-13 NOTE — Patient Instructions (Addendum)
Medication Instructions:   STOP LISINOPRIL    Follow-Up:  6 MONTH FOLLOW-UP WITH DR Meda Coffee       If you need a refill on your cardiac medications before your next appointment, please call your pharmacy.

## 2016-11-24 DIAGNOSIS — I10 Essential (primary) hypertension: Secondary | ICD-10-CM | POA: Diagnosis not present

## 2016-11-24 DIAGNOSIS — E784 Other hyperlipidemia: Secondary | ICD-10-CM | POA: Diagnosis not present

## 2016-11-24 DIAGNOSIS — M810 Age-related osteoporosis without current pathological fracture: Secondary | ICD-10-CM | POA: Diagnosis not present

## 2016-12-02 DIAGNOSIS — M810 Age-related osteoporosis without current pathological fracture: Secondary | ICD-10-CM | POA: Diagnosis not present

## 2016-12-02 DIAGNOSIS — Z87898 Personal history of other specified conditions: Secondary | ICD-10-CM | POA: Diagnosis not present

## 2016-12-02 DIAGNOSIS — I10 Essential (primary) hypertension: Secondary | ICD-10-CM | POA: Diagnosis not present

## 2016-12-02 DIAGNOSIS — N401 Enlarged prostate with lower urinary tract symptoms: Secondary | ICD-10-CM | POA: Diagnosis not present

## 2016-12-02 DIAGNOSIS — I951 Orthostatic hypotension: Secondary | ICD-10-CM | POA: Diagnosis not present

## 2016-12-02 DIAGNOSIS — R159 Full incontinence of feces: Secondary | ICD-10-CM | POA: Diagnosis not present

## 2016-12-02 DIAGNOSIS — Z0000 Encounter for general adult medical examination without abnormal findings: Secondary | ICD-10-CM | POA: Diagnosis not present

## 2016-12-02 DIAGNOSIS — E784 Other hyperlipidemia: Secondary | ICD-10-CM | POA: Diagnosis not present

## 2016-12-02 DIAGNOSIS — J449 Chronic obstructive pulmonary disease, unspecified: Secondary | ICD-10-CM | POA: Diagnosis not present

## 2016-12-02 DIAGNOSIS — G3184 Mild cognitive impairment, so stated: Secondary | ICD-10-CM | POA: Diagnosis not present

## 2017-02-11 DIAGNOSIS — L720 Epidermal cyst: Secondary | ICD-10-CM | POA: Diagnosis not present

## 2017-02-11 DIAGNOSIS — D2262 Melanocytic nevi of left upper limb, including shoulder: Secondary | ICD-10-CM | POA: Diagnosis not present

## 2017-02-11 DIAGNOSIS — D225 Melanocytic nevi of trunk: Secondary | ICD-10-CM | POA: Diagnosis not present

## 2017-02-11 DIAGNOSIS — L570 Actinic keratosis: Secondary | ICD-10-CM | POA: Diagnosis not present

## 2017-02-11 DIAGNOSIS — D1801 Hemangioma of skin and subcutaneous tissue: Secondary | ICD-10-CM | POA: Diagnosis not present

## 2017-02-11 DIAGNOSIS — Z85828 Personal history of other malignant neoplasm of skin: Secondary | ICD-10-CM | POA: Diagnosis not present

## 2017-02-11 DIAGNOSIS — L821 Other seborrheic keratosis: Secondary | ICD-10-CM | POA: Diagnosis not present

## 2017-02-11 DIAGNOSIS — D2261 Melanocytic nevi of right upper limb, including shoulder: Secondary | ICD-10-CM | POA: Diagnosis not present

## 2017-03-02 DIAGNOSIS — H47013 Ischemic optic neuropathy, bilateral: Secondary | ICD-10-CM | POA: Diagnosis not present

## 2017-03-02 DIAGNOSIS — H10413 Chronic giant papillary conjunctivitis, bilateral: Secondary | ICD-10-CM | POA: Diagnosis not present

## 2017-03-02 DIAGNOSIS — H1132 Conjunctival hemorrhage, left eye: Secondary | ICD-10-CM | POA: Diagnosis not present

## 2017-04-06 DIAGNOSIS — M170 Bilateral primary osteoarthritis of knee: Secondary | ICD-10-CM | POA: Insufficient documentation

## 2017-04-06 DIAGNOSIS — I471 Supraventricular tachycardia: Secondary | ICD-10-CM | POA: Insufficient documentation

## 2017-04-06 DIAGNOSIS — H3530 Unspecified macular degeneration: Secondary | ICD-10-CM | POA: Insufficient documentation

## 2017-04-06 DIAGNOSIS — R001 Bradycardia, unspecified: Secondary | ICD-10-CM | POA: Insufficient documentation

## 2017-04-06 DIAGNOSIS — I10 Essential (primary) hypertension: Secondary | ICD-10-CM | POA: Insufficient documentation

## 2017-04-06 DIAGNOSIS — T7840XA Allergy, unspecified, initial encounter: Secondary | ICD-10-CM | POA: Insufficient documentation

## 2017-04-19 ENCOUNTER — Encounter: Payer: Self-pay | Admitting: Cardiology

## 2017-04-19 ENCOUNTER — Ambulatory Visit: Payer: Medicare HMO | Admitting: Cardiology

## 2017-04-19 VITALS — BP 120/64 | HR 61 | Ht 71.0 in | Wt 149.0 lb

## 2017-04-19 DIAGNOSIS — R55 Syncope and collapse: Secondary | ICD-10-CM

## 2017-04-19 DIAGNOSIS — R001 Bradycardia, unspecified: Secondary | ICD-10-CM

## 2017-04-19 DIAGNOSIS — G909 Disorder of the autonomic nervous system, unspecified: Secondary | ICD-10-CM | POA: Diagnosis not present

## 2017-04-19 DIAGNOSIS — I10 Essential (primary) hypertension: Secondary | ICD-10-CM | POA: Diagnosis not present

## 2017-04-19 DIAGNOSIS — I951 Orthostatic hypotension: Secondary | ICD-10-CM

## 2017-04-19 MED ORDER — MIDODRINE HCL 2.5 MG PO TABS: 2.5000 mg | ORAL_TABLET | Freq: Three times a day (TID) | ORAL | 1 refills | Status: DC

## 2017-04-19 MED ORDER — DUTASTERIDE 0.5 MG PO CAPS: 0.5000 mg | ORAL_CAPSULE | Freq: Every day | ORAL | 2 refills | Status: DC

## 2017-04-19 NOTE — Progress Notes (Signed)
04/19/2017 [name]   01-02-1931  [id number]  Primary Physician Marton Redwood, MD Primary Cardiologist: Dr. Meda Coffee  Reason for Visit/CC: F/u for Orthostatic Hypotension and Bradycardia   HPI:  Kevin Page is a 83 y.o. male with hx of HTN, HLD, BPH, OA, macular degeneration, habitual alcohol intake (1 drink of gin daily), former tobacco abuse (44 yrs, quit 1990s), syncope 2014 (event monitor with SB 40s-50s, brief SVT), mild AI and mildly dilated aortic root in 2014 and recently admitted for syncope who presentes for follow up.   He had an episode of syncope in 12/2012 following a stressful day where he hadn't had a lot of fluids. 2D echo 01/12/13: EF 55-60%, grade 1 DD, mild AI, mildly dilated aortic root, mild MR, mild-mod dilated. 48 hour Holter showed episodes of sinus bradycardia with longest pause 1.8 seconds, also with afew episodes of SVT, the longest run lasting 12 beats with rate 128 BPM -> he did have recordings of HR in the 40s at that time. This was felt possibly indicative of SSS, although there was no indication for PPM at that time. He also underwent GXT that did not show chronotropic incompetence.  He was in [place] up until 01/18/16 when he had episode of syncope. Workup showed an 17mmHg drop in BP in ED with 20 beat rise in HR, suggestive of orthostasis. He was treated with IVF. His episode of syncope after standing for prolonged period of time. Telemetry showed bradycardia predominantly during sleep however also at 10 AM with heart rate down to 41. Longest R to R. 1.6 seconds and patient was not symptomatic at that time. His TSH is normal. Echocardiogram today shows normal LVEF, diastolic dysfunction with normal filling pressures, sclerotic aortic valve without any significant stenosis and normal right-sided pressures. Lisinopril and Flomax both were held. Then his BP was elevated and resumed half dose of lisinopril, 5 mg. He was advised to maintain good hydration and discharged  home stable with plan to place 2 weeks monitor.   He presents back for f/u. He is here with his wife. He has done well. No recurrent syncope. He did have a brief episode of near syncope while at church but symptoms quickly passed. He denies CP and dyspnea. Orthostatic VS were checked again in clinic today and he is not orthostatic. He has done ok w/o his Flomax. No difficulties with urination. Cardiac monitor reviewed. Predominant sinus bradycardia with no pause longer > 3 sec. Few PVCs, infrequent PACs. Infrequent very short runs of atrial tachycardia. No other significant arrhythmias. Average HR was 58.   06/08/2016 - 3 months follow-up, the patient states that since the last visit he has had 2 episodes of presyncope, one of them in short while he was sitting for prolonged period of time, but then when he tried to move around his symptoms resolved. He is trying to hydrate well, he continues to take lisinopril 5 mg daily. He has purchased EKG monitor for his iPhone but doesn't have any recordings.  08/14/2016 - 2 months follow up, symptoms of dizziness have improved, but are still present. No more presyncope or syncope. The patient's wife noticed memory problems such as forgetting to take shower or brushing his teeth. Denis chest pain, LE edema , pND or orthopnea.   11/13/2016 - this is a 3 months follow-up, the patient feels well and denies any dizziness presyncope or syncope. His biggest problem is his macular degeneration is currently legally blind. He has found several aids that are  helping him cope with daily life such as reading 2 application can read newspaper for him or scanned document. His wife has noticed that he has been more forgetful in lost his way on several occasions.  04/19/17 - 6 months follow up. The patient has been doing well, however had two syncopal episodes during Thanksgiving - one after prolonged 8 hour drive, the other after sitting in a restaurant. He didn't loose consciousness  completely but saw dark circles and his wife states that he was "not present and has no recollection of the episodes till the next day". Denies chest pain or SOB, LE edema.  He is minimally active secondary to dizziness and the fact that he is legally blind  Current Meds  Medication Sig  . alendronate (FOSAMAX) 70 MG tablet Take 70 mg by mouth every Thursday. Take with a full glass of water on an empty stomach.  [name] aspirin EC 81 MG tablet Take 81 mg by mouth daily.  . brimonidine (ALPHAGAN) 0.2 % ophthalmic solution Place 1 drop into both eyes at bedtime.  . donepezil (ARICEPT) 10 MG tablet Take 10 mg by mouth at bedtime.  . finasteride (PROSCAR) 5 MG tablet Take 5 mg by mouth daily.  . fluticasone (FLONASE) 50 MCG/ACT nasal spray Place 1 spray into both nostrils daily as needed for allergies or rhinitis.  . Multiple Vitamins-Minerals (MACULAR HEALTH FORMULA PO) Take 2 capsules by mouth daily.  . simvastatin (ZOCOR) 20 MG tablet Take 20 mg by mouth daily.  . sodium chloride (OCEAN) 0.65 % SOLN nasal spray Place 1 spray into both nostrils at bedtime as needed for congestion.   No Known Allergies Past Medical History:  Diagnosis Date  . Allergy    dust and mites  . BPH (benign prostatic hyperplasia)   . Essential hypertension   . Hyperlipidemia   . Macular degeneration   . Osteoarthritis of both knees   . Sinus bradycardia   . SVT (supraventricular tachycardia) ([place])    a. Event monitor 2014: few episodes of SVT, longest 12 beats with rate 128.  [name] Syncope    Family History  Problem Relation Age of Onset  . Cancer Brother        tonsils  . Heart attack Neg Hx    Past Surgical History:  Procedure Laterality Date  . CATARACT EXTRACTION W/ INTRAOCULAR LENS  IMPLANT, BILATERAL Bilateral 2011  . dupytron contractions  1998 , 2006   both hands  . LUMBAR FUSION  1994   Social History   Socioeconomic History  . Marital status: Married    Spouse name: Not on file  . Number of  children: Not on file  . Years of education: Not on file  . Highest education level: Not on file  Social Needs  . Financial resource strain: Not on file  . Food insecurity - worry: Not on file  . Food insecurity - inability: Not on file  . Transportation needs - medical: Not on file  . Transportation needs - non-medical: Not on file  Occupational History  . Not on file  Tobacco Use  . Smoking status: Former Research scientist (life sciences)  . Smokeless tobacco: Never Used  . Tobacco comment: Smoked for 44 years, quit 1990s  Substance and Sexual Activity  . Alcohol use: Yes    Alcohol/week: 6.0 oz    Types: 12 Standard drinks or equivalent per week    Comment: 1 drink of gin nightly, occasionally more if going out to dinner.  . Drug  use: No  . Sexual activity: Not on file  Other Topics Concern  . Not on file  Social History Narrative  . Not on file     Review of Systems: General: negative for chills, fever, night sweats or weight changes.  Cardiovascular: negative for chest pain, dyspnea on exertion, edema, orthopnea, palpitations, paroxysmal nocturnal dyspnea or shortness of breath Dermatological: negative for rash Respiratory: negative for cough or wheezing Urologic: negative for hematuria Abdominal: negative for nausea, vomiting, diarrhea, bright red blood per rectum, melena, or hematemesis Neurologic: negative for visual changes, syncope, or dizziness All other systems reviewed and are otherwise negative except as noted above.  Physical Exam:  Blood pressure 120/64, pulse 61, height 5\' 11"  (1.803 m), weight 149 lb (67.6 kg), SpO2 97 %.  General appearance: alert, cooperative and no distress Neck: no carotid bruit and no JVD Lungs: clear to auscultation bilaterally Heart: regular rate and rhythm, S1, S2 normal, no murmur, click, rub or gallop Extremities: extremities normal, atraumatic, no cyanosis or edema Pulses: 2+ and symmetric Skin: Skin color, texture, turgor normal. No rashes or  lesions Neurologic: Grossly normal  EKG performed on 08/14/16 - personally reviewed, shows ST, otherwise normal ECG, unchanged from prior.    ASSESSMENT AND PLAN:   1. Syncope: occurred twice, highly suspicious for orthostatic hypotension.  Orthostatic vital signs done in the office today were negative. It might be triggered by a prolonged sitting. He is encouraged to increase fluid intake, we will start midodrine 2.5 mg po TID.  2. Orthostatic Hypotension: in the setting of Flomax and lisinopril. Originally improved after discontinuation of those drugs but now recurrent syncope.  3. Bradycardia: monitor was ok as outlined above. Resting HR in clinic today is in the 96s. Avoid AVN blocking agents. He had Alive Cor and will use it with next episode of dizziness.  4. HTN: as above. We wil monitor after adding midodrine.  5. Memory loss - we discontinued simvastatin with no significant improvement,he was started on Aricept.   PLAN  F/u with a pharmacist in 2 weeks.   Ena Dawley , MD 04/19/2017 10:50 PM

## 2017-04-19 NOTE — Patient Instructions (Signed)
Medication Instructions:   START TAKING MIDODRINE 2.5 MG BY MOUTH THREE TIMES DAILY     Follow-Up:  WITH OUR PHARMACIST IN [facility] IN 2 WEEKS---SCHEDULE PATIENT TO SEE EITHER KELLY AUTEN OR MEGAN SUPPLE       If you need a refill on your cardiac medications before your next appointment, please call your pharmacy.

## 2017-04-28 ENCOUNTER — Telehealth: Payer: Self-pay | Admitting: *Deleted

## 2017-04-28 DIAGNOSIS — M25551 Pain in right hip: Secondary | ICD-10-CM | POA: Diagnosis not present

## 2017-04-28 DIAGNOSIS — M545 Low back pain: Secondary | ICD-10-CM | POA: Diagnosis not present

## 2017-04-28 NOTE — Telephone Encounter (Signed)
Called and made patient aware that Dr. Meda Coffee is okay with the patient taking the "Medrol Tablet Therapy Pack 4 mg--Take as directed for 6 days". Patient verbalized understanding and thanked me for the call.

## 2017-04-28 NOTE — Telephone Encounter (Signed)
Per front desk, patient dropped off copy of prescription for Medrol Tablet Therapy Pack 4mg --take as directed for 6 days with no refills dated 04/28/17. This was given by Dr. Harvie Heck at [facility].   Attached note states patient wants to know if ok to for him to take. Would like called with Dr. Francesca Oman response. [phone]

## 2017-04-28 NOTE — Telephone Encounter (Signed)
Yes its ok for him to take it

## 2017-04-29 DIAGNOSIS — R69 Illness, unspecified: Secondary | ICD-10-CM | POA: Diagnosis not present

## 2017-04-29 DIAGNOSIS — R222 Localized swelling, mass and lump, trunk: Secondary | ICD-10-CM | POA: Diagnosis not present

## 2017-04-29 DIAGNOSIS — E7800 Pure hypercholesterolemia, unspecified: Secondary | ICD-10-CM | POA: Diagnosis not present

## 2017-05-19 DIAGNOSIS — M25551 Pain in right hip: Secondary | ICD-10-CM | POA: Diagnosis not present

## 2017-05-20 ENCOUNTER — Ambulatory Visit (INDEPENDENT_AMBULATORY_CARE_PROVIDER_SITE_OTHER): Payer: Medicare HMO | Admitting: Pharmacist

## 2017-05-20 VITALS — BP 152/82 | HR 64

## 2017-05-20 DIAGNOSIS — I951 Orthostatic hypotension: Secondary | ICD-10-CM | POA: Diagnosis not present

## 2017-05-20 NOTE — Progress Notes (Signed)
Patient ID: Kevin Page                 DOB: 08-Jul-1930                      MRN: [id number]     HPI: Kevin Page is a 84 y.o. male referred by Dr. Meda Coffee to HTN clinic. PMH is significant for orthostatic hypotension, memory issues, HLD, BPH, OA, macular degeneration, syncope in 2014, and former tobacco abuse (44 years, quit in 1990s). Pt was admitted for syncope in September 2017 after standing for a prolonged period of time. Pt was treated with IVF and lisinopril and Flomax were both held. BP then increased and pt was resumed on lower dose of lisinopril at 5mg  daily. Orthostatics checked at f/u visits and were negative. Pt seen in clinic 1 month ago and vitals were negative for orthostasis. Pt is no longer taking any medication for his BP. Pt did have 2 syncopal episodes during Thanksgiving, one after a prolonged 8 hour drive and the other after sitting in a restaurant. Pt was encouraged to increase fluid intake and was started on midodrine 2.5mg  TID.  Pt presents today with his wife. He reports no issues tolerating the midodrine. He has been taking his evening dose 3 hours before bedtime to avoid nocturnal hypertension. Denies dizziness or headaches. He has had 1 syncopal episode in the past month similar to previous episodes. He and his wife had guests over for dinner on December 16. He was sitting in his chair when he did not feel well. One of the guests was a doctor who checked his pulse which was normal. He went to bed early and felt better. Syncopal episodes tend to occur in the afternoon but pt and wife have not been able to determine anything in particular that may be causing the episodes. Denies orthostatic events. He does have pain in his leg from a pulled muscle and has not been sleeping as well.   Discussed patient's BPH medications which can cause dizziness. Pt reports he is finishing his finasteride, then finishing dutasteride, then switching back to tamsulosin for lower cost.  Pt's wife  checks BP readings at home using bicep cuff they have had for years. Most readings average 130-upper 140s. Low of 138/74 and high of 161/75. All BP readings are taken when pt is sitting. Pt also has [facility] app on his phone but some EKG readings do not result well. His apple watch can detect falls and pt is looking into an app for his watch that can check his BP.  Current HTN meds: midodrine 2.5mg  TID Previously tried: lisinopril and Flomax d/c'ed when orthostatic in hospital BP goal: <140/49mmHg due to age, syncope, and legal blindness  Family History: Non-contributory  Social History: 1 drink of gin nightly. Previously smoked for 44 years but quit in 1990s.  Diet: Has been trying to stay more hydrated with water as directed  Exercise: Minimally active secondary to dizziness and the fact that he is legally blind  Home BP readings: 138/74 low, 161/75 high. Average 476-546T systolic. All readings taken when sitting.  Wt Readings from Last 3 Encounters:  04/19/17 149 lb (67.6 kg)  11/13/16 150 lb (68 kg)  08/14/16 150 lb (68 kg)   BP Readings from Last 3 Encounters:  04/19/17 120/64  11/13/16 124/64  08/14/16 118/64   Pulse Readings from Last 3 Encounters:  04/19/17 61  11/13/16 67  08/14/16 72    Renal function:  CrCl cannot be calculated (Patient's most recent lab result is older than the maximum 21 days allowed.).  Past Medical History:  Diagnosis Date  . Allergy    dust and mites  . BPH (benign prostatic hyperplasia)   . Essential hypertension   . Hyperlipidemia   . Macular degeneration   . Osteoarthritis of both knees   . Sinus bradycardia   . SVT (supraventricular tachycardia) ([place])    a. Event monitor 2014: few episodes of SVT, longest 12 beats with rate 128.  [name] Syncope     Current Outpatient Medications on File Prior to Visit  Medication Sig Dispense Refill  . alendronate (FOSAMAX) 70 MG tablet Take 70 mg by mouth every Thursday. Take with a full glass of water  on an empty stomach.    [name] aspirin EC 81 MG tablet Take 81 mg by mouth daily.    . brimonidine (ALPHAGAN) 0.2 % ophthalmic solution Place 1 drop into both eyes at bedtime.    . donepezil (ARICEPT) 10 MG tablet Take 10 mg by mouth at bedtime.  11  . dutasteride (AVODART) 0.5 MG capsule Take 1 capsule (0.5 mg total) by mouth daily. 90 capsule 2  . finasteride (PROSCAR) 5 MG tablet Take 5 mg by mouth daily.  11  . fluticasone (FLONASE) 50 MCG/ACT nasal spray Place 1 spray into both nostrils daily as needed for allergies or rhinitis.    . midodrine (PROAMATINE) 2.5 MG tablet Take 1 tablet (2.5 mg total) by mouth 3 (three) times daily with meals. 270 tablet 1  . Multiple Vitamins-Minerals (MACULAR HEALTH FORMULA PO) Take 2 capsules by mouth daily.    . simvastatin (ZOCOR) 20 MG tablet Take 20 mg by mouth daily.  1  . sodium chloride (OCEAN) 0.65 % SOLN nasal spray Place 1 spray into both nostrils at bedtime as needed for congestion.     No current facility-administered medications on file prior to visit.     No Known Allergies   Assessment/Plan:  1. Hypotension - Pt had 1 syncopal episode in the past month since starting midodrine. He has been tolerating the medication well but does not report any improvement or worsening of symptoms. Overall he feels well and denies dizziness aside from occasional syncopal episodes that tend to occur in the evening. BP has increased from 120/60s to 150/80s today since starting low dose midodrine. Will discuss with Dr Meda Coffee regarding discontinuing midodrine since pt has not noticed any improvement in his syncopal episodes (still ~1 per month). Also cautioned pt as he discontinues dutasteride/finasteride and resumes tamsulosin since this may cause dizziness.  Will f/u with pt regarding discontinuation of midodrine and any other recommendations after discussion with Dr Meda Coffee.   Megan E. Supple, PharmD, CPP, [facility] [address], [place], [place] [address] Phone: [phone]; Fax: [phone] 05/20/2017 2:16 PM

## 2017-05-20 NOTE — Patient Instructions (Signed)
It was nice to meet you today  I will touch base with Dr Meda Coffee to discuss stopping your midodrine and give you a call no later than tomorrow

## 2017-05-21 ENCOUNTER — Telehealth: Payer: Self-pay | Admitting: Pharmacist

## 2017-05-21 NOTE — Telephone Encounter (Signed)
Discussed pt with Dr Meda Coffee - ok with discontinuing midodrine since pt did not improve on therapy. Could consider referral to neurology since syncopal episodes do not seem to be cardiac in nature. Spoke with pt and he is aware to discontinue midodrine. He would not like to be referred to neurology at this time. Advised pt to call clinic if his syncopal episodes become more bothersome or frequent than once monthly and neurology referral can be considered at that time.

## 2017-08-12 DIAGNOSIS — L821 Other seborrheic keratosis: Secondary | ICD-10-CM | POA: Diagnosis not present

## 2017-08-12 DIAGNOSIS — D485 Neoplasm of uncertain behavior of skin: Secondary | ICD-10-CM | POA: Diagnosis not present

## 2017-08-12 DIAGNOSIS — D1801 Hemangioma of skin and subcutaneous tissue: Secondary | ICD-10-CM | POA: Diagnosis not present

## 2017-08-12 DIAGNOSIS — L570 Actinic keratosis: Secondary | ICD-10-CM | POA: Diagnosis not present

## 2017-08-12 DIAGNOSIS — D2262 Melanocytic nevi of left upper limb, including shoulder: Secondary | ICD-10-CM | POA: Diagnosis not present

## 2017-08-12 DIAGNOSIS — Z85828 Personal history of other malignant neoplasm of skin: Secondary | ICD-10-CM | POA: Diagnosis not present

## 2017-08-12 DIAGNOSIS — L0109 Other impetigo: Secondary | ICD-10-CM | POA: Diagnosis not present

## 2017-08-31 DIAGNOSIS — H3530 Unspecified macular degeneration: Secondary | ICD-10-CM | POA: Diagnosis not present

## 2017-08-31 DIAGNOSIS — H543 Unqualified visual loss, both eyes: Secondary | ICD-10-CM | POA: Diagnosis not present

## 2017-11-04 DIAGNOSIS — R69 Illness, unspecified: Secondary | ICD-10-CM | POA: Diagnosis not present

## 2017-11-08 DIAGNOSIS — H43813 Vitreous degeneration, bilateral: Secondary | ICD-10-CM | POA: Diagnosis not present

## 2017-11-08 DIAGNOSIS — H353134 Nonexudative age-related macular degeneration, bilateral, advanced atrophic with subfoveal involvement: Secondary | ICD-10-CM | POA: Diagnosis not present

## 2017-11-08 DIAGNOSIS — H543 Unqualified visual loss, both eyes: Secondary | ICD-10-CM | POA: Diagnosis not present

## 2017-11-22 DIAGNOSIS — R69 Illness, unspecified: Secondary | ICD-10-CM | POA: Diagnosis not present

## 2017-11-23 DIAGNOSIS — N401 Enlarged prostate with lower urinary tract symptoms: Secondary | ICD-10-CM | POA: Diagnosis not present

## 2017-11-23 DIAGNOSIS — I951 Orthostatic hypotension: Secondary | ICD-10-CM | POA: Diagnosis not present

## 2017-11-23 DIAGNOSIS — R55 Syncope and collapse: Secondary | ICD-10-CM | POA: Diagnosis not present

## 2017-11-23 DIAGNOSIS — Z6821 Body mass index (BMI) 21.0-21.9, adult: Secondary | ICD-10-CM | POA: Diagnosis not present

## 2017-11-24 DIAGNOSIS — R55 Syncope and collapse: Secondary | ICD-10-CM | POA: Diagnosis not present

## 2017-12-07 DIAGNOSIS — M810 Age-related osteoporosis without current pathological fracture: Secondary | ICD-10-CM | POA: Diagnosis not present

## 2017-12-07 DIAGNOSIS — R55 Syncope and collapse: Secondary | ICD-10-CM | POA: Diagnosis not present

## 2017-12-07 DIAGNOSIS — E7849 Other hyperlipidemia: Secondary | ICD-10-CM | POA: Diagnosis not present

## 2017-12-07 DIAGNOSIS — R82998 Other abnormal findings in urine: Secondary | ICD-10-CM | POA: Diagnosis not present

## 2017-12-07 DIAGNOSIS — I10 Essential (primary) hypertension: Secondary | ICD-10-CM | POA: Diagnosis not present

## 2017-12-14 DIAGNOSIS — M810 Age-related osteoporosis without current pathological fracture: Secondary | ICD-10-CM | POA: Diagnosis not present

## 2017-12-14 DIAGNOSIS — E7849 Other hyperlipidemia: Secondary | ICD-10-CM | POA: Diagnosis not present

## 2017-12-14 DIAGNOSIS — Z7189 Other specified counseling: Secondary | ICD-10-CM | POA: Diagnosis not present

## 2017-12-14 DIAGNOSIS — Z6821 Body mass index (BMI) 21.0-21.9, adult: Secondary | ICD-10-CM | POA: Diagnosis not present

## 2017-12-14 DIAGNOSIS — G3184 Mild cognitive impairment, so stated: Secondary | ICD-10-CM | POA: Diagnosis not present

## 2017-12-14 DIAGNOSIS — R55 Syncope and collapse: Secondary | ICD-10-CM | POA: Diagnosis not present

## 2017-12-14 DIAGNOSIS — Z0000 Encounter for general adult medical examination without abnormal findings: Secondary | ICD-10-CM | POA: Diagnosis not present

## 2017-12-14 DIAGNOSIS — I951 Orthostatic hypotension: Secondary | ICD-10-CM | POA: Diagnosis not present

## 2017-12-14 DIAGNOSIS — R159 Full incontinence of feces: Secondary | ICD-10-CM | POA: Diagnosis not present

## 2017-12-14 DIAGNOSIS — I10 Essential (primary) hypertension: Secondary | ICD-10-CM | POA: Diagnosis not present

## 2018-02-17 DIAGNOSIS — Z85828 Personal history of other malignant neoplasm of skin: Secondary | ICD-10-CM | POA: Diagnosis not present

## 2018-02-17 DIAGNOSIS — L570 Actinic keratosis: Secondary | ICD-10-CM | POA: Diagnosis not present

## 2018-02-17 DIAGNOSIS — L821 Other seborrheic keratosis: Secondary | ICD-10-CM | POA: Diagnosis not present

## 2018-02-17 DIAGNOSIS — L720 Epidermal cyst: Secondary | ICD-10-CM | POA: Diagnosis not present

## 2018-02-17 DIAGNOSIS — D485 Neoplasm of uncertain behavior of skin: Secondary | ICD-10-CM | POA: Diagnosis not present

## 2018-03-02 DIAGNOSIS — R69 Illness, unspecified: Secondary | ICD-10-CM | POA: Diagnosis not present

## 2018-03-28 DIAGNOSIS — M545 Low back pain: Secondary | ICD-10-CM | POA: Diagnosis not present

## 2018-04-11 DIAGNOSIS — R159 Full incontinence of feces: Secondary | ICD-10-CM | POA: Diagnosis not present

## 2018-04-28 ENCOUNTER — Ambulatory Visit: Payer: Medicare HMO | Attending: General Surgery | Admitting: Physical Therapy

## 2018-04-28 DIAGNOSIS — M6281 Muscle weakness (generalized): Secondary | ICD-10-CM

## 2018-04-28 DIAGNOSIS — R293 Abnormal posture: Secondary | ICD-10-CM | POA: Diagnosis not present

## 2018-04-28 NOTE — Therapy (Signed)
[facility] [address], [place], [place], [address] Phone: [phone]   Fax:  [phone]  Physical Therapy Evaluation  Patient Details  Name: Kevin [name] MRN: [id number] Date of Birth: 08/06/1930 Referring Provider (PT): Leighton Ruff, MD   Encounter Date: 04/28/2018  PT End of Session - 04/28/18 1125    Visit Number  1    Date for PT Re-Evaluation  06/23/18    PT Start Time  3532    PT Stop Time  1215    PT Time Calculation (min)  49 min    Activity Tolerance  Patient tolerated treatment well    Behavior During Therapy  [facility] for tasks assessed/performed       Past Medical History:  Diagnosis Date  . Allergy    dust and mites  . BPH (benign prostatic hyperplasia)   . Essential hypertension   . Hyperlipidemia   . Macular degeneration   . Osteoarthritis of both knees   . Sinus bradycardia   . SVT (supraventricular tachycardia) ([place])    a. Event monitor 2014: few episodes of SVT, longest 12 beats with rate 128.  [name] Syncope     Past Surgical History:  Procedure Laterality Date  . CATARACT EXTRACTION W/ INTRAOCULAR LENS  IMPLANT, BILATERAL Bilateral 2011  . dupytron contractions  1998 , 2006   both hands  . LUMBAR FUSION  1994    There were no vitals filed for this visit.   Subjective Assessment - 04/28/18 1132    Subjective  Pt has been having fecal incontinence.  I have a small amount of leakage that has been going on for many years.  Denies worsening of symptoms. Leakage not everyday and not large amount.  Stool is formed and large size, reports no difficutly passing.  Pt reports it feels like gas.  Pt's wife informs that he has been taking psyllium without any changes.    Patient is accompained by:  Family member   wife present   Pertinent History  vision problems (legally blind)    Patient Stated Goals  get rid of leakage    Currently in Pain?  No/denies         [facility] PT Assessment -  04/28/18 0001      Assessment   Medical Diagnosis  R15.9 (ICD-10-CM) - Full incontinence of feces    Referring Provider (PT)  Leighton Ruff, MD    Prior Therapy  No      Precautions   Precautions  None      Restrictions   Weight Bearing Restrictions  No      Balance Screen   Has the patient fallen in the past 6 months  Yes    How many times?  [place] residence    Living Arrangements  Spouse/significant other      Prior Function   Level of Independence  Independent      Cognition   Overall Cognitive Status  Within Functional Limits for tasks assessed      Posture/Postural Control   Posture/Postural Control  Postural limitations    Postural Limitations  Rounded Shoulders;Flexed trunk      ROM / Strength   AROM / PROM / Strength  AROM;Strength      AROM   Overall AROM Comments  lumbar flexion and extension 60% limited      Strength   Overall Strength Comments  hip extension,  abduction, flexion 4/5 MMT bilateral      Flexibility   Soft Tissue Assessment /Muscle Length  yes    Hamstrings  60 degrees Lt LE; 50 deg Rt LE      Special Tests   Other special tests  active SLR; demonstrates pelvic instability      Ambulation/Gait   Gait Pattern  Trunk flexed                Objective measurements completed on examination: See above findings.    Pelvic Floor Special Questions - 04/28/18 0001    Prior Pelvic/Prostate Exam  Yes    Urinary Leakage  No    Urinary urgency  No    Fecal incontinence  Yes   BM every 2-3 days   Skin Integrity  Intact    Perineal Body/Introitus   Descended    Pelvic Floor Internal Exam  identity confirmed and pt consents to intenal soft tissue assessment    Exam Type  Rectal    Palpation  puborectalis 3/5 MMT, but sphincters only 2/5    Strength  weak squeeze, no lift    Strength # of seconds  3    Tone  low tone of anal sphincters               PT Education - 04/28/18  1216    Education Details   Access Code: [email or web address]     Person(s) Educated  Patient    Methods  Explanation;Demonstration;Handout;Verbal cues    Comprehension  Verbalized understanding;Returned demonstration       PT Short Term Goals - 04/28/18 1528      PT SHORT TERM GOAL #1   Title  ind with initial HEP    Time  4    Period  Weeks    Status  New    Target Date  05/26/18      PT SHORT TERM GOAL #2   Title  pt will report fecal leakage no more than 3x/week    Baseline  almost every [name]    Time  4    Period  Weeks    Status  New    Target Date  05/26/18        PT Long Term Goals - 04/28/18 1529      PT LONG TERM GOAL #1   Title  Pt will be ind with advanced HEP    Time  8    Period  Weeks    Status  New    Target Date  06/23/18      PT LONG TERM GOAL #2   Title  pt will report 50% reduction in fecal leakage    Time  8    Period  Weeks    Status  New    Target Date  06/23/18      PT LONG TERM GOAL #3   Title  pt will report he is able to hold gas/BM for >/= to 5 minutes after initial urge in order to make it to the bathroom    Time  8    Period  Weeks    Status  New    Target Date  06/23/18      PT LONG TERM GOAL #4   Title  pt will demonstrate improved upright posture due to improved core and hip strength     Time  8    Period  Weeks    Status  New    Target Date  06/23/18  Plan - 04/28/18 1217    Clinical Impression Statement  Pt presents to clinic due to fecal incontinence.  Pt demonstrates some LE weakness especially glutes.  He has gait and posture abnormalities with increasd trunk flexion.  Pt has core weakness demonstrated by pelvic instability with SLR.  Pt has decreased lumbar flex/extension.  Pt has impairmed hamstirng and gluteal flexibility. He demonstrates weak anal sphincters with ability to hold for 3 seconds, and low tone.  Pt will benefit from skilled PT to address impairments and improve ability to perform functional  toileting tasks.    History and Personal Factors relevant to plan of care:  lumbar fusion    Clinical Presentation  Stable    Clinical Presentation due to:  pt is stable    Clinical Decision Making  Low    Rehab Potential  Excellent    PT Frequency  2x / week    PT Duration  8 weeks    PT Treatment/Interventions  ADLs/Self Care Home Management;Biofeedback;Cryotherapy;Electrical Stimulation;Moist Heat;Therapeutic exercise;Therapeutic activities;Manual techniques;Dry needling;Patient/family education;Passive range of motion;Neuromuscular re-education;Taping    PT Next Visit Plan  anal sphincter strength using tactile cues, glute strength, posutre, biofeedback    PT Home Exercise Plan   Access Code: [email or web address]     Recommended Other Services  eval 04/29/19    Consulted and Agree with Plan of Care  Patient;Family member/caregiver    Family Member Consulted  wife       Patient will benefit from skilled therapeutic intervention in order to improve the following deficits and impairments:  Increased muscle spasms, Impaired tone, Decreased strength, Impaired flexibility, Decreased range of motion, Postural dysfunction  Visit Diagnosis: Muscle weakness (generalized)  Abnormal posture     Problem List Patient Active Problem List   Diagnosis Date Noted  . SVT (supraventricular tachycardia) ([place])   . Sinus bradycardia   . Osteoarthritis of both knees   . Macular degeneration   . Essential hypertension   . Allergy   . Advanced dry age-related macular degeneration of both eyes with subfoveal involvement 03/03/2016  . Hypertensive retinopathy of both eyes 03/03/2016  . Pseudophakia of both eyes 03/03/2016  . PVD (posterior vitreous detachment), both eyes 03/03/2016  . Orthostasis 01/19/2016  . Dilated aortic root ([place]) 01/19/2016  . PSVT (paroxysmal supraventricular tachycardia) ([place]) 01/19/2016  . Mild aortic insufficiency 01/19/2016  . Sinus bradycardia seen on cardiac monitor 01/18/2016   . Syncope 01/12/2013  . Hypertension 01/12/2013  . Hyperlipidemia 01/12/2013  . BPH (benign prostatic hyperplasia) 01/12/2013    [name], PT 04/28/2018, 4:43 PM  [facility] [address] [place], [place], [address] Phone: [phone]   Fax:  [phone]  Name: Kevin [name] MRN: [id number] Date of Birth: Jun 02, 1930

## 2018-04-28 NOTE — Patient Instructions (Signed)
Access Code: [email or web address]  URL: [email or web address]  Date: 04/28/2018  Prepared by: Lovett Calender   Exercises  Sidelying Pelvic Floor Contraction with Self-Palpation - 10 reps - 1 sets - 3 sec hold, 5 sec rest hold - 3x daily - 7x weekly  Seated Hamstring Stretch - 3 reps - 1 sets - 30 sec hold - 1x daily - 7x weekly

## 2018-05-05 ENCOUNTER — Encounter

## 2018-05-17 ENCOUNTER — Encounter: Payer: Self-pay | Admitting: Physical Therapy

## 2018-05-17 ENCOUNTER — Ambulatory Visit: Payer: Medicare HMO | Admitting: Physical Therapy

## 2018-05-17 DIAGNOSIS — R293 Abnormal posture: Secondary | ICD-10-CM

## 2018-05-17 DIAGNOSIS — M6281 Muscle weakness (generalized): Secondary | ICD-10-CM

## 2018-05-17 NOTE — Therapy (Signed)
[facility] [address], [place], [place], [address] Phone: [phone]   Fax:  [phone]  Physical Therapy Treatment  Patient Details  Name: Kevin Page MRN: [id number] Date of Birth: 10/14/1930 Referring Provider (PT): Leighton Ruff, MD   Encounter Date: 05/17/2018  PT End of Session - 05/17/18 0933    Visit Number  2    Date for PT Re-Evaluation  06/23/18    PT Start Time  0933    PT Stop Time  1015    PT Time Calculation (min)  42 min    Activity Tolerance  Patient tolerated treatment well    Behavior During Therapy  [facility] for tasks assessed/performed       Past Medical History:  Diagnosis Date  . Allergy    dust and mites  . BPH (benign prostatic hyperplasia)   . Essential hypertension   . Hyperlipidemia   . Macular degeneration   . Osteoarthritis of both knees   . Sinus bradycardia   . SVT (supraventricular tachycardia) ([place])    a. Event monitor 2014: few episodes of SVT, longest 12 beats with rate 128.  [name] Syncope     Past Surgical History:  Procedure Laterality Date  . CATARACT EXTRACTION W/ INTRAOCULAR LENS  IMPLANT, BILATERAL Bilateral 2011  . dupytron contractions  1998 , 2006   both hands  . LUMBAR FUSION  1994    There were no vitals filed for this visit.  Subjective Assessment - 05/17/18 0933    Subjective  Pt states he doesn't feel any changes.  Pt's wife reports he isn't doing them as often as he shoulder (1x/day)    Patient is accompained by:  Family member    Pertinent History  vision problems (legally blind)    Patient Stated Goals  get rid of leakage    Currently in Pain?  No/denies                       [facility] PT Treatment/Exercise - 05/17/18 0001      Exercises   Exercises  Knee/Hip;Lumbar      Lumbar Exercises: Supine   Ab Set  10 reps;Limitations    AB Set Limitations  lots of verbal and tactile cues    Clam  20 reps;Limitations    Clam  Limitations  red bnd    Bent Knee Raise  20 reps    Bridge with Ball Squeeze  15 reps;2 seconds    Large Ball Abdominal Isometric  10 reps   ball press     Knee/Hip Exercises: Standing   Hip Extension  Left;Right;Stengthening;10 reps      Knee/Hip Exercises: Seated   Sit to Sand  3 sets;5 reps;without UE support   elevated on mat table ;cues to squeeze glutes and no ploppin     Knee/Hip Exercises: Supine   Bridges  Strengthening;Both;10 reps   mini              PT Short Term Goals - 05/17/18 1324      PT SHORT TERM GOAL #1   Title  ind with initial HEP    Status  Achieved      PT SHORT TERM GOAL #2   Title  pt will report fecal leakage no more than 3x/week    Status  On-going        PT Long Term Goals - 04/28/18 1529      PT LONG TERM GOAL #  1   Title  Pt will be ind with advanced HEP    Time  8    Period  Weeks    Status  New    Target Date  06/23/18      PT LONG TERM GOAL #2   Title  pt will report 50% reduction in fecal leakage    Time  8    Period  Weeks    Status  New    Target Date  06/23/18      PT LONG TERM GOAL #3   Title  pt will report he is able to hold gas/BM for >/= to 5 minutes after initial urge in order to make it to the bathroom    Time  8    Period  Weeks    Status  New    Target Date  06/23/18      PT LONG TERM GOAL #4   Title  pt will demonstrate improved upright posture due to improved core and hip strength     Time  8    Period  Weeks    Status  New    Target Date  06/23/18            Plan - 05/17/18 1324    Clinical Impression Statement  Pt met STG for HEP.  overall, no change in leakage at this time.  pt needed cues to stand up taller throughout session .  Very flexed posture observed throughout and pt wife present to report that he has been more slumped lately.  Pt will benefit from skilled PT to improve strength and posture for greater control of toileting activities.    PT Treatment/Interventions  ADLs/Self Care  Home Management;Biofeedback;Cryotherapy;Electrical Stimulation;Moist Heat;Therapeutic exercise;Therapeutic activities;Manual techniques;Dry needling;Patient/family education;Passive range of motion;Neuromuscular re-education;Taping    PT Next Visit Plan  anal sphincter strength using tactile cues, glute strength, posutre, biofeedback    PT Home Exercise Plan   Access Code: [email or web address]     Consulted and Agree with Plan of Care  Patient;Family member/caregiver    Family Member Consulted  wife       Patient will benefit from skilled therapeutic intervention in order to improve the following deficits and impairments:  Increased muscle spasms, Impaired tone, Decreased strength, Impaired flexibility, Decreased range of motion, Postural dysfunction  Visit Diagnosis: Muscle weakness (generalized)  Abnormal posture     Problem List Patient Active Problem List   Diagnosis Date Noted  . SVT (supraventricular tachycardia) ([place])   . Sinus bradycardia   . Osteoarthritis of both knees   . Macular degeneration   . Essential hypertension   . Allergy   . Advanced dry age-related macular degeneration of both eyes with subfoveal involvement 03/03/2016  . Hypertensive retinopathy of both eyes 03/03/2016  . Pseudophakia of both eyes 03/03/2016  . PVD (posterior vitreous detachment), both eyes 03/03/2016  . Orthostasis 01/19/2016  . Dilated aortic root ([place]) 01/19/2016  . PSVT (paroxysmal supraventricular tachycardia) ([place]) 01/19/2016  . Mild aortic insufficiency 01/19/2016  . Sinus bradycardia seen on cardiac monitor 01/18/2016  . Syncope 01/12/2013  . Hypertension 01/12/2013  . Hyperlipidemia 01/12/2013  . BPH (benign prostatic hyperplasia) 01/12/2013    [name], PT 05/17/2018, 1:28 PM  [facility] [address], [place], [place], [address] Phone: [phone]   Fax:  [phone]  Name: Kevin Page MRN: [id number] Date of  Birth: 12-07-1930

## 2018-05-19 ENCOUNTER — Encounter

## 2018-05-23 ENCOUNTER — Ambulatory Visit: Payer: Medicare HMO | Attending: General Surgery | Admitting: Physical Therapy

## 2018-05-23 DIAGNOSIS — M6281 Muscle weakness (generalized): Secondary | ICD-10-CM

## 2018-05-23 DIAGNOSIS — R293 Abnormal posture: Secondary | ICD-10-CM | POA: Insufficient documentation

## 2018-05-23 NOTE — Therapy (Signed)
[facility] [address] [place], [place], [address] Phone: [phone]   Fax:  [phone]  Physical Therapy Treatment  Patient Details  Name: Kevin Page MRN: [id number] Date of Birth: April 28, 1931 Referring Provider (PT): Leighton Ruff, MD   Encounter Date: 05/23/2018  PT End of Session - 05/23/18 1536    Visit Number  3    Date for PT Re-Evaluation  06/23/18    PT Start Time  2683    PT Stop Time  1610    PT Time Calculation (min)  40 min    Activity Tolerance  Patient tolerated treatment well    Behavior During Therapy  [facility] for tasks assessed/performed       Past Medical History:  Diagnosis Date  . Allergy    dust and mites  . BPH (benign prostatic hyperplasia)   . Essential hypertension   . Hyperlipidemia   . Macular degeneration   . Osteoarthritis of both knees   . Sinus bradycardia   . SVT (supraventricular tachycardia) ([place])    a. Event monitor 2014: few episodes of SVT, longest 12 beats with rate 128.  [name] Syncope     Past Surgical History:  Procedure Laterality Date  . CATARACT EXTRACTION W/ INTRAOCULAR LENS  IMPLANT, BILATERAL Bilateral 2011  . dupytron contractions  1998 , 2006   both hands  . LUMBAR FUSION  1994    There were no vitals filed for this visit.  Subjective Assessment - 05/23/18 1728    Subjective  Pt states he feels the same.  Pt wife states it is hard to do the exercises lying down because the bed is soft    Patient is accompained by:  Family member    Pertinent History  vision problems (legally blind)    Patient Stated Goals  get rid of leakage    Currently in Pain?  No/denies                       [facility] Adult PT Treatment/Exercise - 05/23/18 0001      Lumbar Exercises: Stretches   Other Lumbar Stretch Exercise  upper trap stretch - 3 x 20    Other Lumbar Stretch Exercise  pec stretch in doorway - 3 x 20      Lumbar Exercises: Aerobic   UBE (Upper  Arm Bike)  L2 x 4 min backwards    Nustep  L 1 x 4 min cues to stay in 40 SPM range      Lumbar Exercises: Standing   Row  Strengthening;20 reps;Theraband    Theraband Level (Row)  Level 3 (Green)    Shoulder Extension  Strengthening;20 reps;Theraband    Theraband Level (Shoulder Extension)  Level 2 (Red)      Lumbar Exercises: Seated   Long Arc Quad on Chair  Strengthening;Right;Left;10 reps   ball squeeze   Other Seated Lumbar Exercises  thoracic ext with ball behind back - 10x      Lumbar Exercises: Sidelying   Clam  Both;20 reps      Knee/Hip Exercises: Seated   Ball Squeeze  10 x 3 sec    Sit to Sand  3 sets;5 reps;without UE support   elevated on mat table ;cues to squeeze glutes and no ploppin     Knee/Hip Exercises: Supine   Straight Leg Raises  Strengthening;Both;20 reps             PT Education - 05/23/18 1723  Education Details  Access Code: [email or web address]     Person(s) Educated  Patient;Spouse    Methods  Explanation;Demonstration;Handout;Verbal cues    Comprehension  Verbalized understanding;Returned demonstration       PT Short Term Goals - 05/17/18 1324      PT SHORT TERM GOAL #1   Title  ind with initial HEP    Status  Achieved      PT SHORT TERM GOAL #2   Title  pt will report fecal leakage no more than 3x/week    Status  On-going        PT Long Term Goals - 04/28/18 1529      PT LONG TERM GOAL #1   Title  Pt will be ind with advanced HEP    Time  8    Period  Weeks    Status  New    Target Date  06/23/18      PT LONG TERM GOAL #2   Title  pt will report 50% reduction in fecal leakage    Time  8    Period  Weeks    Status  New    Target Date  06/23/18      PT LONG TERM GOAL #3   Title  pt will report he is able to hold gas/BM for >/= to 5 minutes after initial urge in order to make it to the bathroom    Time  8    Period  Weeks    Status  New    Target Date  06/23/18      PT LONG TERM GOAL #4   Title  pt will demonstrate  improved upright posture due to improved core and hip strength     Time  8    Period  Weeks    Status  New    Target Date  06/23/18            Plan - 05/23/18 1724    Clinical Impression Statement  Pt needs cues to stay on task.  He reports no changes but is having difficulty remembering to do exercises.  Pt was given more sitting exercises to add to HEP in order to make it more convenient to do them.  Pt did well when given cues to stand up and sit up tall.  He demonstrates improved upright posture.  Pt had some increased Lt shoulder pain after doing the UBE today.  Pt will had difficulty with sit to stand and straight leg raise.  He will benefit from skilled PT to continue to progress strength for improved toileting function.    PT Treatment/Interventions  ADLs/Self Care Home Management;Biofeedback;Cryotherapy;Electrical Stimulation;Moist Heat;Therapeutic exercise;Therapeutic activities;Manual techniques;Dry needling;Patient/family education;Passive range of motion;Neuromuscular re-education;Taping    PT Next Visit Plan  LE/ glute strength, posutre, biofeedback    PT Home Exercise Plan   Access Code: [email or web address]     Consulted and Agree with Plan of Care  Patient;Family member/caregiver    Family Member Consulted  wife       Patient will benefit from skilled therapeutic intervention in order to improve the following deficits and impairments:  Increased muscle spasms, Impaired tone, Decreased strength, Impaired flexibility, Decreased range of motion, Postural dysfunction  Visit Diagnosis: Muscle weakness (generalized)  Abnormal posture     Problem List Patient Active Problem List   Diagnosis Date Noted  . SVT (supraventricular tachycardia) ([place])   . Sinus bradycardia   . Osteoarthritis of both knees   . Macular degeneration   .  Essential hypertension   . Allergy   . Advanced dry age-related macular degeneration of both eyes with subfoveal involvement 03/03/2016  . Hypertensive  retinopathy of both eyes 03/03/2016  . Pseudophakia of both eyes 03/03/2016  . PVD (posterior vitreous detachment), both eyes 03/03/2016  . Orthostasis 01/19/2016  . Dilated aortic root ([place]) 01/19/2016  . PSVT (paroxysmal supraventricular tachycardia) ([place]) 01/19/2016  . Mild aortic insufficiency 01/19/2016  . Sinus bradycardia seen on cardiac monitor 01/18/2016  . Syncope 01/12/2013  . Hypertension 01/12/2013  . Hyperlipidemia 01/12/2013  . BPH (benign prostatic hyperplasia) 01/12/2013    [name], PT 05/23/2018, 5:29 PM  [facility] [address], [place], [place], [address] Phone: [phone]   Fax:  [phone]  Name: Kevin Page MRN: [id number] Date of Birth: [birth year]

## 2018-05-24 DIAGNOSIS — R69 Illness, unspecified: Secondary | ICD-10-CM | POA: Diagnosis not present

## 2018-05-26 ENCOUNTER — Ambulatory Visit: Payer: Medicare HMO | Admitting: Physical Therapy

## 2018-05-26 DIAGNOSIS — R293 Abnormal posture: Secondary | ICD-10-CM

## 2018-05-26 DIAGNOSIS — M6281 Muscle weakness (generalized): Secondary | ICD-10-CM

## 2018-05-26 NOTE — Therapy (Signed)
[facility] [address] [place], [place], [address] Phone: [phone]   Fax:  [phone]  Physical Therapy Treatment  Patient Details  Name: Kevin Page MRN: [id number] Date of Birth: 1931/01/13 Referring Provider (PT): Leighton Ruff, MD   Encounter Date: 05/26/2018  PT End of Session - 05/26/18 1450    Visit Number  4    Date for PT Re-Evaluation  06/23/18    PT Start Time  4709    PT Stop Time  1525    PT Time Calculation (min)  40 min    Activity Tolerance  Patient tolerated treatment well    Behavior During Therapy  [facility] for tasks assessed/performed       Past Medical History:  Diagnosis Date  . Allergy    dust and mites  . BPH (benign prostatic hyperplasia)   . Essential hypertension   . Hyperlipidemia   . Macular degeneration   . Osteoarthritis of both knees   . Sinus bradycardia   . SVT (supraventricular tachycardia) ([place])    a. Event monitor 2014: few episodes of SVT, longest 12 beats with rate 128.  [name] Syncope     Past Surgical History:  Procedure Laterality Date  . CATARACT EXTRACTION W/ INTRAOCULAR LENS  IMPLANT, BILATERAL Bilateral 2011  . dupytron contractions  1998 , 2006   both hands  . LUMBAR FUSION  1994    There were no vitals filed for this visit.  Subjective Assessment - 05/26/18 1451    Subjective  Pt and his wife state they did the exercises yesterday.  Pt states he doens't notice any difference.    Patient is accompained by:  Family member   wife   Pertinent History  vision problems (legally blind)    Patient Stated Goals  get rid of leakage    Currently in Pain?  No/denies                       [facility] Adult PT Treatment/Exercise - 05/26/18 0001      Lumbar Exercises: Aerobic   Nustep  L 1/2 x 6 min cues to stay in 50 or more SPM      Lumbar Exercises: Seated   Long Arc Quad on Chair  Strengthening;Right;Left;10 reps;3 sets   ball squeeze for 10   LAQ on Chair Weights (lbs)  2    Long [employer] on Emet  Strengthening;Right;Left;10 reps;2 sets;Weights   in Theatre stage manager on [employer] (lbs)  2    Sit to Stand  10 reps   10 with ball squeeze, 10 without   Other Seated Lumbar Exercises  thoracic ext with ball behind back - 10x      Lumbar Exercises: Supine   Clam  20 reps;Limitations    Clam Limitations  yellow band - single leg; tactile cues to keep neutral pelvic alignment    Heel Slides  20 reps    Bridge  20 reps      Lumbar Exercises: Sidelying   Clam  Both;20 reps   yellow band; cues to not roll back     Knee/Hip Exercises: Standing   Hip Abduction  Stengthening;2 sets;10 reps    Abduction Limitations  2lb    Hip Extension  Stengthening;Right;Left;2 sets;10 reps    Extension Limitations  2 lb               PT Short Term Goals - 05/17/18 1324  PT SHORT TERM GOAL #1   Title  ind with initial HEP    Status  Achieved      PT SHORT TERM GOAL #2   Title  pt will report fecal leakage no more than 3x/week    Status  On-going        PT Long Term Goals - 04/28/18 1529      PT LONG TERM GOAL #1   Title  Pt will be ind with advanced HEP    Time  8    Period  Weeks    Status  New    Target Date  06/23/18      PT LONG TERM GOAL #2   Title  pt will report 50% reduction in fecal leakage    Time  8    Period  Weeks    Status  New    Target Date  06/23/18      PT LONG TERM GOAL #3   Title  pt will report he is able to hold gas/BM for >/= to 5 minutes after initial urge in order to make it to the bathroom    Time  8    Period  Weeks    Status  New    Target Date  06/23/18      PT LONG TERM GOAL #4   Title  pt will demonstrate improved upright posture due to improved core and hip strength     Time  8    Period  Weeks    Status  New    Target Date  06/23/18            Plan - 05/26/18 1609    Clinical Impression Statement  Pt was able to advance exercises with added resistance.  Pt needs cues  to slow down and stabilize pelvis.  He has difficulty keeping LE from flopping to the left when in hooklying.  Pt was challenged with clamshells, but he reports it is easy.  He needed a lot of motivation to really put in effort needd to do the exercises correctly.  Pt will continue to benefit from skilled PT to continue with POC.    PT Treatment/Interventions  ADLs/Self Care Home Management;Biofeedback;Cryotherapy;Electrical Stimulation;Moist Heat;Therapeutic exercise;Therapeutic activities;Manual techniques;Dry needling;Patient/family education;Passive range of motion;Neuromuscular re-education;Taping    PT Next Visit Plan  start with biofeedback today, LE/ glute strength, posutre, biofeedback    PT Home Exercise Plan   Access Code: [email or web address]     Consulted and Agree with Plan of Care  Patient;Family member/caregiver    Family Member Consulted  wife       Patient will benefit from skilled therapeutic intervention in order to improve the following deficits and impairments:  Increased muscle spasms, Impaired tone, Decreased strength, Impaired flexibility, Decreased range of motion, Postural dysfunction  Visit Diagnosis: Muscle weakness (generalized)  Abnormal posture     Problem List Patient Active Problem List   Diagnosis Date Noted  . SVT (supraventricular tachycardia) ([place])   . Sinus bradycardia   . Osteoarthritis of both knees   . Macular degeneration   . Essential hypertension   . Allergy   . Advanced dry age-related macular degeneration of both eyes with subfoveal involvement 03/03/2016  . Hypertensive retinopathy of both eyes 03/03/2016  . Pseudophakia of both eyes 03/03/2016  . PVD (posterior vitreous detachment), both eyes 03/03/2016  . Orthostasis 01/19/2016  . Dilated aortic root ([place]) 01/19/2016  . PSVT (paroxysmal supraventricular tachycardia) ([place]) 01/19/2016  . Mild aortic insufficiency  01/19/2016  . Sinus bradycardia seen on cardiac monitor 01/18/2016  . Syncope  01/12/2013  . Hypertension 01/12/2013  . Hyperlipidemia 01/12/2013  . BPH (benign prostatic hyperplasia) 01/12/2013    [name], PT 05/26/2018, 4:19 PM  [facility] [address], [place], [place], [address] Phone: [phone]   Fax:  [phone]  Name: Kevin Page MRN: [id number] Date of Birth: 03/07/1931

## 2018-05-30 ENCOUNTER — Ambulatory Visit: Payer: Medicare HMO | Admitting: Physical Therapy

## 2018-05-30 DIAGNOSIS — M6281 Muscle weakness (generalized): Secondary | ICD-10-CM | POA: Diagnosis not present

## 2018-05-30 DIAGNOSIS — R293 Abnormal posture: Secondary | ICD-10-CM | POA: Diagnosis not present

## 2018-05-30 NOTE — Therapy (Signed)
[facility] [address], [place], [place], [address] Phone: [phone]   Fax:  [phone]  Physical Therapy Treatment  Patient Details  Name: Kevin Page MRN: [id number] Date of Birth: 12-Sep-1930 Referring Provider (PT): Leighton Ruff, MD   Encounter Date: 05/30/2018  PT End of Session - 05/30/18 1459    Visit Number  5    Date for PT Re-Evaluation  06/23/18    PT Start Time  8099    PT Stop Time  1535    PT Time Calculation (min)  42 min    Activity Tolerance  Patient tolerated treatment well    Behavior During Therapy  [facility] for tasks assessed/performed       Past Medical History:  Diagnosis Date  . Allergy    dust and mites  . BPH (benign prostatic hyperplasia)   . Essential hypertension   . Hyperlipidemia   . Macular degeneration   . Osteoarthritis of both knees   . Sinus bradycardia   . SVT (supraventricular tachycardia) ([place])    a. Event monitor 2014: few episodes of SVT, longest 12 beats with rate 128.  [name] Syncope     Past Surgical History:  Procedure Laterality Date  . CATARACT EXTRACTION W/ INTRAOCULAR LENS  IMPLANT, BILATERAL Bilateral 2011  . dupytron contractions  1998 , 2006   both hands  . LUMBAR FUSION  1994    There were no vitals filed for this visit.  Subjective Assessment - 05/30/18 1505    Subjective  Pt states it has gotten much better with less fecal smearing.      Patient is accompained by:  Family member   wife present for second half of session   Pertinent History  vision problems (legally blind)    Patient Stated Goals  get rid of leakage    Currently in Pain?  No/denies                       [facility] Adult PT Treatment/Exercise - 05/30/18 0001      Lumbar Exercises: Aerobic   Nustep  L 1-3 45 sec intervals x 5 min cues to stay in 50 or more SPM      Lumbar Exercises: Standing   Shoulder Extension  Strengthening;Theraband;10 reps   3 sets    Theraband Level (Shoulder Extension)  Level 2 (Red)    Other Standing Lumbar Exercises  standing on foam mat - hip abduction and ext - 10x, heel raises 20x    Other Standing Lumbar Exercises  red band rotation of obliques - 20x each way      Lumbar Exercises: Seated   Sit to Stand  10 reps      Lumbar Exercises: Supine   Large Ball Abdominal Isometric  20 reps   red ball roll   Large Ball Oblique Isometric  10 reps   side to side with red ball     Knee/Hip Exercises: Seated   Clamshell with TheraBand  Red    Sit to Sand  2 sets;10 reps;without UE support   red band for gluteal activation               PT Short Term Goals - 05/17/18 1324      PT SHORT TERM GOAL #1   Title  ind with initial HEP    Status  Achieved      PT SHORT TERM GOAL #2   Title  pt will report  fecal leakage no more than 3x/week    Status  On-going        PT Long Term Goals - 04/28/18 1529      PT LONG TERM GOAL #1   Title  Pt will be ind with advanced HEP    Time  8    Period  Weeks    Status  New    Target Date  06/23/18      PT LONG TERM GOAL #2   Title  pt will report 50% reduction in fecal leakage    Time  8    Period  Weeks    Status  New    Target Date  06/23/18      PT LONG TERM GOAL #3   Title  pt will report he is able to hold gas/BM for >/= to 5 minutes after initial urge in order to make it to the bathroom    Time  8    Period  Weeks    Status  New    Target Date  06/23/18      PT LONG TERM GOAL #4   Title  pt will demonstrate improved upright posture due to improved core and hip strength     Time  8    Period  Weeks    Status  New    Target Date  06/23/18            Plan - 05/30/18 1555    Clinical Impression Statement  Pt reports feeling much better overall.  His is demonstrating improved posture and improving ability to slowly perform reps of exercises with min cues not to plop.  Pt was able to add intervals with increased resistance on nustep and maintain  SPM >45-50.  Pt will continue to benefit from skilled PT to improve strength and edurance.    PT Treatment/Interventions  ADLs/Self Care Home Management;Biofeedback;Cryotherapy;Electrical Stimulation;Moist Heat;Therapeutic exercise;Therapeutic activities;Manual techniques;Dry needling;Patient/family education;Passive range of motion;Neuromuscular re-education;Taping    PT Next Visit Plan  start with biofeedback for re-assess, LE/ glute strength, posutre    PT Home Exercise Plan   Access Code: [email or web address]     Consulted and Agree with Plan of Care  Patient;Family member/caregiver       Patient will benefit from skilled therapeutic intervention in order to improve the following deficits and impairments:  Increased muscle spasms, Impaired tone, Decreased strength, Impaired flexibility, Decreased range of motion, Postural dysfunction  Visit Diagnosis: Muscle weakness (generalized)  Abnormal posture     Problem List Patient Active Problem List   Diagnosis Date Noted  . SVT (supraventricular tachycardia) ([place])   . Sinus bradycardia   . Osteoarthritis of both knees   . Macular degeneration   . Essential hypertension   . Allergy   . Advanced dry age-related macular degeneration of both eyes with subfoveal involvement 03/03/2016  . Hypertensive retinopathy of both eyes 03/03/2016  . Pseudophakia of both eyes 03/03/2016  . PVD (posterior vitreous detachment), both eyes 03/03/2016  . Orthostasis 01/19/2016  . Dilated aortic root ([place]) 01/19/2016  . PSVT (paroxysmal supraventricular tachycardia) ([place]) 01/19/2016  . Mild aortic insufficiency 01/19/2016  . Sinus bradycardia seen on cardiac monitor 01/18/2016  . Syncope 01/12/2013  . Hypertension 01/12/2013  . Hyperlipidemia 01/12/2013  . BPH (benign prostatic hyperplasia) 01/12/2013    [name], PT 05/30/2018, 4:02 PM  [facility] [address], [place], [place],  [address] Phone: [phone]   Fax:  [phone]  Name: Kevin Page MRN: [id number] Date of Birth: Sep 16, 1930

## 2018-06-02 ENCOUNTER — Ambulatory Visit: Payer: Medicare HMO | Admitting: Physical Therapy

## 2018-06-02 ENCOUNTER — Encounter: Payer: Self-pay | Admitting: Physical Therapy

## 2018-06-02 DIAGNOSIS — R293 Abnormal posture: Secondary | ICD-10-CM | POA: Diagnosis not present

## 2018-06-02 DIAGNOSIS — M6281 Muscle weakness (generalized): Secondary | ICD-10-CM

## 2018-06-02 NOTE — Therapy (Addendum)
[facility] [address], [place], [place], [address] Phone: [phone]   Fax:  [phone]  Physical Therapy Treatment  Patient Details  Name: Abhimanyu Cruces MRN: [id number] Date of Birth: April 17, 1931 Referring Provider (PT): Leighton Ruff, MD   Encounter Date: 06/02/2018  PT End of Session - 06/02/18 1534    Visit Number  6    Date for PT Re-Evaluation  06/23/18    PT Start Time  1532    PT Stop Time  1614    PT Time Calculation (min)  42 min    Activity Tolerance  Patient tolerated treatment well    Behavior During Therapy  [facility] for tasks assessed/performed       Past Medical History:  Diagnosis Date  . Allergy    dust and mites  . BPH (benign prostatic hyperplasia)   . Essential hypertension   . Hyperlipidemia   . Macular degeneration   . Osteoarthritis of both knees   . Sinus bradycardia   . SVT (supraventricular tachycardia) ([place])    a. Event monitor 2014: few episodes of SVT, longest 12 beats with rate 128.  [name] Syncope     Past Surgical History:  Procedure Laterality Date  . CATARACT EXTRACTION W/ INTRAOCULAR LENS  IMPLANT, BILATERAL Bilateral 2011  . dupytron contractions  1998 , 2006   both hands  . LUMBAR FUSION  1994    There were no vitals filed for this visit.  Subjective Assessment - 06/02/18 1535    Subjective  Pt has nothing new to report today.  It has been okay.    Patient is accompained by:  Family member   wife   Pertinent History  vision problems (legally blind)    Patient Stated Goals  get rid of leakage    Currently in Pain?  No/denies                       [facility] Adult PT Treatment/Exercise - 06/02/18 0001      Neuro Re-ed    Neuro Re-ed Details   biofeedback: max contraction >93m; holds are >20 mv intially then decreased to only 1-2 seconds holding >10 mV towards the end of the session due to fatigue      Lumbar Exercises: Aerobic   Nustep  L1 x 3 min  for warm up   PT present for status update     Lumbar Exercises: Seated   Other Seated Lumbar Exercises  clam red band and ball squeeze - 1-3 second hold as able      Lumbar Exercises: Supine   Glut Set  10 reps;2 seconds   LE straight; ball squeeze added 10 more reps   Heel Slides  10 reps    Bridge  10 reps    Bridge Limitations  cues to rest between reps             PT Education - 06/02/18 1624    Education Details  reviewed HEP and will focus on seated exercises to increased holding time and to rest longer between sets    Person(s) Educated  Patient;Spouse    Methods  Explanation;Demonstration;Verbal cues;Handout;Tactile cues    Comprehension  Verbalized understanding;Returned demonstration       PT Short Term Goals - 06/02/18 1628      PT SHORT TERM GOAL #1   Title  ind with initial HEP    Status  Achieved      PT SHORT  TERM GOAL #2   Title  pt will report fecal leakage no more than 3x/week    Baseline  1x every biweekly    Status  Achieved        PT Long Term Goals - 06/02/18 1627      PT LONG TERM GOAL #1   Title  Pt will be ind with advanced HEP    Status  On-going      PT LONG TERM GOAL #2   Title  pt will report 50% reduction in fecal leakage    Baseline  1x in 2 weeks and was every other day    Status  Achieved      PT LONG TERM GOAL #3   Title  pt will report he is able to hold gas/BM for >/= to 5 minutes after initial urge in order to make it to the bathroom    Status  On-going      PT LONG TERM GOAL #4   Title  pt will demonstrate improved upright posture due to improved core and hip strength     Baseline  posture has improved significantly    Status  Achieved            Plan - 06/02/18 1629    Clinical Impression Statement  Pt has met most of his long term goals . PT utilized biofeedbck with music so patient could hear cues when he was contrcting and when he was relaxing pelvic floor.  He was able to relax the most when he was  sitting. Pt needed a lot of cues to take longer rest breaks and work on increased hold time of pelvic floor muscles.  He fatigues a lot throughout session, so he is educated on doing less exercises per day but really emphasizing increased holding of contracting trying to increase from 2-3 seconds up to 5-6 seconds until next session . PT will re-evaluate and most likely provide patient with finalized HEP at next session.    PT Treatment/Interventions  ADLs/Self Care Home Management;Biofeedback;Cryotherapy;Electrical Stimulation;Moist Heat;Therapeutic exercise;Therapeutic activities;Manual techniques;Dry needling;Patient/family education;Passive range of motion;Neuromuscular re-education;Taping    PT Next Visit Plan  start with biofeedback for re-assess, LE/ glute strength, posutre    PT Home Exercise Plan   Access Code: [email or web address]     Consulted and Agree with Plan of Care  Patient;Family member/caregiver    Family Member Consulted  wife       Patient will benefit from skilled therapeutic intervention in order to improve the following deficits and impairments:  Increased muscle spasms, Impaired tone, Decreased strength, Impaired flexibility, Decreased range of motion, Postural dysfunction  Visit Diagnosis: Muscle weakness (generalized)  Abnormal posture     Problem List Patient Active Problem List   Diagnosis Date Noted  . SVT (supraventricular tachycardia) ([place])   . Sinus bradycardia   . Osteoarthritis of both knees   . Macular degeneration   . Essential hypertension   . Allergy   . Advanced dry age-related macular degeneration of both eyes with subfoveal involvement 03/03/2016  . Hypertensive retinopathy of both eyes 03/03/2016  . Pseudophakia of both eyes 03/03/2016  . PVD (posterior vitreous detachment), both eyes 03/03/2016  . Orthostasis 01/19/2016  . Dilated aortic root ([place]) 01/19/2016  . PSVT (paroxysmal supraventricular tachycardia) ([place]) 01/19/2016  . Mild aortic  insufficiency 01/19/2016  . Sinus bradycardia seen on cardiac monitor 01/18/2016  . Syncope 01/12/2013  . Hypertension 01/12/2013  . Hyperlipidemia 01/12/2013  . BPH (benign prostatic hyperplasia) 01/12/2013  [name], PT 06/02/2018, 4:41 PM  [facility] [address], [place], [place], [address] Phone: [phone]   Fax:  [phone]  Name: Antonius Hartlage MRN: [id number] Date of Birth: 03-29-1931  PHYSICAL THERAPY DISCHARGE SUMMARY  Visits from Start of Care: 6  Current functional level related to goals / functional outcomes: See above   Remaining deficits: See above   Education / Equipment: HEP  Plan: Patient agrees to discharge.  Patient goals were partially met. Patient is being discharged due to the patient's request.  ?????    Patient called to discharge from therapy, reports does not need anymore per wife.  [name], PT 06/14/18 10:07 AM

## 2018-06-07 DIAGNOSIS — R159 Full incontinence of feces: Secondary | ICD-10-CM | POA: Diagnosis not present

## 2018-06-13 DIAGNOSIS — H353134 Nonexudative age-related macular degeneration, bilateral, advanced atrophic with subfoveal involvement: Secondary | ICD-10-CM | POA: Diagnosis not present

## 2018-06-13 DIAGNOSIS — H43813 Vitreous degeneration, bilateral: Secondary | ICD-10-CM | POA: Diagnosis not present

## 2018-06-13 DIAGNOSIS — Z961 Presence of intraocular lens: Secondary | ICD-10-CM | POA: Diagnosis not present

## 2018-06-14 ENCOUNTER — Ambulatory Visit: Payer: Self-pay | Admitting: Physical Therapy

## 2018-06-15 DIAGNOSIS — G3184 Mild cognitive impairment, so stated: Secondary | ICD-10-CM | POA: Diagnosis not present

## 2018-06-15 DIAGNOSIS — I10 Essential (primary) hypertension: Secondary | ICD-10-CM | POA: Diagnosis not present

## 2018-06-15 DIAGNOSIS — I951 Orthostatic hypotension: Secondary | ICD-10-CM | POA: Diagnosis not present

## 2018-06-15 DIAGNOSIS — R159 Full incontinence of feces: Secondary | ICD-10-CM | POA: Diagnosis not present

## 2018-06-15 DIAGNOSIS — Z6821 Body mass index (BMI) 21.0-21.9, adult: Secondary | ICD-10-CM | POA: Diagnosis not present

## 2018-06-15 DIAGNOSIS — E7849 Other hyperlipidemia: Secondary | ICD-10-CM | POA: Diagnosis not present

## 2018-06-27 DIAGNOSIS — M545 Low back pain: Secondary | ICD-10-CM | POA: Diagnosis not present

## 2018-07-06 ENCOUNTER — Encounter: Payer: Self-pay | Admitting: Physical Therapy

## 2018-07-06 ENCOUNTER — Ambulatory Visit: Payer: Medicare HMO | Attending: Orthopedic Surgery | Admitting: Physical Therapy

## 2018-07-06 ENCOUNTER — Other Ambulatory Visit: Payer: Self-pay

## 2018-07-06 DIAGNOSIS — M545 Low back pain, unspecified: Secondary | ICD-10-CM

## 2018-07-06 DIAGNOSIS — G8929 Other chronic pain: Secondary | ICD-10-CM

## 2018-07-06 DIAGNOSIS — R293 Abnormal posture: Secondary | ICD-10-CM | POA: Insufficient documentation

## 2018-07-06 DIAGNOSIS — M6281 Muscle weakness (generalized): Secondary | ICD-10-CM | POA: Diagnosis present

## 2018-07-06 NOTE — Therapy (Signed)
[facility] [address], [place], [place], [address] Phone: [phone]   Fax:  [phone]  Physical Therapy Evaluation  Patient Details  Name: Kevin Page MRN: [id number] Date of Birth: [birth year] Referring Provider (PT): Renette Butters, MD   Encounter Date: 07/06/2018  PT End of Session - 07/06/18 1644    Visit Number  1    Date for PT Re-Evaluation  08/31/18    Authorization Type  [employer]    PT Start Time  3428    PT Stop Time  1530    PT Time Calculation (min)  45 min    Activity Tolerance  Patient tolerated treatment well    Behavior During Therapy  [facility] for tasks assessed/performed       Past Medical History:  Diagnosis Date  . Allergy    dust and mites  . BPH (benign prostatic hyperplasia)   . Essential hypertension   . Hyperlipidemia   . Macular degeneration   . Osteoarthritis of both knees   . Sinus bradycardia   . SVT (supraventricular tachycardia) ([place])    a. Event monitor 2014: few episodes of SVT, longest 12 beats with rate 128.  [name] Syncope     Past Surgical History:  Procedure Laterality Date  . CATARACT EXTRACTION W/ INTRAOCULAR LENS  IMPLANT, BILATERAL Bilateral 2011  . dupytron contractions  1998 , 2006   both hands  . LUMBAR FUSION  1994    There were no vitals filed for this visit.   Subjective Assessment - 07/06/18 1449    Subjective  Pt has a long history of LBP.  Lumbar fusion L4/5 in 1990s.  Pain is localized in Rt low back.  Pain typically increases as day progresses.  Pain is intermittent.    Patient is accompained by:  Family member   spouse   Pertinent History  vision problems (legally blind)    How long can you sit comfortably?  no limit    How long can you stand comfortably?  no limit    How long can you walk comfortably?  no limit    Patient Stated Goals  prevent back from getting worse    Currently in Pain?  No/denies   can get up to a 3/10         [facility] PT Assessment - 07/06/18 0001      Assessment   Medical Diagnosis  M54.5 (ICD-10-CM) - Low back pain    Referring Provider (PT)  Renette Butters, MD    Onset Date/Surgical Date  --   years ago   Next MD Visit  08/10/18    Prior Therapy  yes      Precautions   Precautions  None      Restrictions   Weight Bearing Restrictions  No      Balance Screen   Has the patient fallen in the past 6 months  Yes    How many times?  1   syncope related   Has the patient had a decrease in activity level because of a fear of falling?   No    Is the patient reluctant to leave their home because of a fear of falling?   No      Home Environment   Living Environment  Private residence    Living Arrangements  Spouse/significant other    Type of [place] to enter  Entrance Stairs-Number of Steps  6    Entrance Stairs-Rails  Can reach both;Left;Right    Home Layout  Two level   primarily lives on first floor   Alternate Level Stairs-Number of Steps  Castine seat;Grab bars - toilet;Grab bars - tub/shower;Toilet riser      Prior Function   Level of Independence  Independent with basic ADLs   has cognitive deficits and macular degeneration   Vocation  Retired    Leisure  watch TV      Cognition   Overall Cognitive Status  History of cognitive impairments - at baseline      Functional Tests   Functional tests  Single leg stance      Single Leg Stance   Comments  unable to perform bil without UE assistance      Posture/Postural Control   Posture/Postural Control  Postural limitations    Postural Limitations  Decreased thoracic kyphosis;Decreased lumbar lordosis;Flexed trunk   flexed at hips     ROM / Strength   AROM / PROM / Strength  AROM;Strength      AROM   Overall AROM Comments  bil hip ER limited 50%, trunk ROM limited 75% for SB and ext, limited 50% trunk flexion      Strength   Overall Strength Comments   4-/5 throughout bil LEs      Flexibility   Soft Tissue Assessment /Muscle Length  yes    Hamstrings  limited 50%    Quadriceps  limited 50%      Palpation   Palpation comment  no tenderness noted throughout bil lumbar and hips      Bed Mobility   Bed Mobility  --   independent but moves slowly     Ambulation/Gait   Ambulation/Gait  Yes    Ambulation/Gait Assistance  6: Modified independent (Device/Increase time)    Assistive device  None    Gait Pattern  Step-through pattern;Decreased arm swing - right;Decreased arm swing - left;Decreased step length - right;Decreased step length - left   knees remain flexed, forward trunk lean     Balance   Balance Assessed  Yes      Standardized Balance Assessment   Standardized Balance Assessment  Five Times Sit to Stand;Timed Up and Go Test    Five times sit to stand comments   26 sec, hands on thighs      Timed Up and Go Test   TUG  Normal TUG    Normal TUG (seconds)  14      High Level Balance   High Level Balance Comments  unable to balance in SLS > 2 sec Rt/Lt      Functional Gait  Assessment   Gait assessed   Yes                Objective measurements completed on examination: See above findings.              PT Education - 07/06/18 1524    Education Details  Access Code: [email or web address]     Person(s) Educated  Patient;Spouse    Methods  Explanation;Demonstration;Verbal cues;Handout    Comprehension  Verbalized understanding;Returned demonstration       PT Short Term Goals - 07/06/18 1655      PT SHORT TERM GOAL #1   Title  ind with initial HEP for strength, flexibility and ROM    Time  4    Period  Weeks  Status  New    Target Date  08/03/18      PT SHORT TERM GOAL #2   Title  Pt will perform 5x sit to stand in </= 22 sec to demo reduction in fall risk.    Time  4    Period  Weeks    Status  New    Target Date  08/03/18      PT SHORT TERM GOAL #3   Title  Pt will ambulate at least 100 feet  without need for upright posture cueing to reduce risk of anterior fall risk.    Time  4    Period  Weeks    Status  New    Target Date  08/03/18      PT SHORT TERM GOAL #4   Title  Pt will be able to stand in SLS bil with fingertip UE support bilaterally for at least 30 sec to demo improved closed chain strength and balance.    Time  4    Period  Weeks    Status  New    Target Date  08/03/18        PT Long Term Goals - 07/06/18 1659      PT LONG TERM GOAL #1   Title  Pt will be ind with advanced HEP and understand how to safely progress.    Time  8    Period  Weeks    Status  New    Target Date  08/31/18      PT LONG TERM GOAL #2   Title  Pt will report painfree days at least 3 days out of the week.    Time  8    Period  Weeks    Status  New    Target Date  08/31/18      PT LONG TERM GOAL #3   Title  Pt will achieve strength rating in bil LEs of at least 4/5 to improve functional task performance such as sit to stand.    Time  8    Period  Weeks    Status  New    Target Date  08/31/18      PT LONG TERM GOAL #4   Title  pt will demonstrate improved upright posture during static and dynamic postures due to improved core and hip strength     Time  8    Period  Weeks    Status  New    Target Date  08/31/18      PT LONG TERM GOAL #5   Title  Pt will demonstrate stair ascent/descent safely using bil railings to improve safety as he comes/goes from his home.     Time  8    Period  Weeks    Status  New    Target Date  08/31/18             Plan - 07/06/18 1645    Clinical Impression Statement  Pt is an 85yo man accompanied by his wife for an evaluation for LBP.  Pt states pain is right sided and intermittent.  He has days without pain.  Pain was not present today but Pt states pain can get to a 3/10 and his goal is to prevent pain from worsening.  He has significant ROM restrictions in lumbar and bil hips, weakness in bil LEs, poor balance, and gait and postural  deficits.  He ambulates without assistive device but with flexed trunk and flexed knees bil.  He has cognitive deficits and is legally blind (macular degeneration) which may complicate progress, although his wife is very helpful and supportive.  Pt was able to perform TUG test in 14 sec and 5x sit to stand in 26 seconds today.  Started Pt on seated stretches today.  Pt will benefit from skilled PT for gait, balance, strength, ROM and functional endurance.    Clinical Presentation  Evolving    Clinical Presentation due to:  history of syncope, cognitive deficits, legally blind    Clinical Decision Making  Moderate    Rehab Potential  Good    PT Frequency  2x / week    PT Duration  8 weeks    PT Treatment/Interventions  ADLs/Self Care Home Management;Cryotherapy;Electrical Stimulation;Moist Heat;Gait training;Stair training;Functional mobility training;Therapeutic activities;Therapeutic exercise;Balance training;Neuromuscular re-education;Patient/family education;Manual techniques;Passive range of motion;Dry needling;Energy conservation;Taping;Joint Manipulations;Spinal Manipulations    PT Next Visit Plan  f/u on seated stretches (HEP), begin LE strength, core, gentle spine ROM, gait training for more upright posture, balance    PT Home Exercise Plan  Access Code: [email or web address]    Consulted and Agree with Plan of Care  Patient;Family member/caregiver    Family Member Consulted  wife       Patient will benefit from skilled therapeutic intervention in order to improve the following deficits and impairments:  Abnormal gait, Decreased cognition, Improper body mechanics, Pain, Decreased coordination, Decreased mobility, Postural dysfunction, Decreased activity tolerance, Decreased endurance, Decreased range of motion, Decreased strength, Hypomobility, Decreased balance, Impaired flexibility, Impaired vision/preception  Visit Diagnosis: Chronic right-sided low back pain without sciatica - Plan: PT plan of care  cert/re-cert  Muscle weakness (generalized) - Plan: PT plan of care cert/re-cert  Abnormal posture - Plan: PT plan of care cert/re-cert     Problem List Patient Active Problem List   Diagnosis Date Noted  . SVT (supraventricular tachycardia) ([place])   . Sinus bradycardia   . Osteoarthritis of both knees   . Macular degeneration   . Essential hypertension   . Allergy   . Advanced dry age-related macular degeneration of both eyes with subfoveal involvement 03/03/2016  . Hypertensive retinopathy of both eyes 03/03/2016  . Pseudophakia of both eyes 03/03/2016  . PVD (posterior vitreous detachment), both eyes 03/03/2016  . Orthostasis 01/19/2016  . Dilated aortic root ([place]) 01/19/2016  . PSVT (paroxysmal supraventricular tachycardia) ([place]) 01/19/2016  . Mild aortic insufficiency 01/19/2016  . Sinus bradycardia seen on cardiac monitor 01/18/2016  . Syncope 01/12/2013  . Hypertension 01/12/2013  . Hyperlipidemia 01/12/2013  . BPH (benign prostatic hyperplasia) 01/12/2013   Baruch Merl, PT 07/06/18 5:07 PM   [facility] [address], [place], [place], [address] Phone: [phone]   Fax:  [phone]  Name: Kevin Page MRN: [id number] Date of Birth: 1930/05/25

## 2018-07-06 NOTE — Patient Instructions (Signed)
Access Code: [email or web address]  URL: [email or web address]  Date: 07/06/2018  Prepared by: [place] Night Donesha Wallander   Exercises  Seated Piriformis Stretch - 3 reps - 3 sets - 30 hold - 1x daily - 7x weekly  Seated Figure 4 Piriformis Stretch - 3 reps - 3 sets - 30 hold - 1x daily - 7x weekly  Seated Hamstring Stretch - 3 reps - 3 sets - 30 hold - 1x daily - 7x weekly

## 2018-07-14 ENCOUNTER — Ambulatory Visit: Payer: Medicare HMO | Admitting: Physical Therapy

## 2018-07-14 ENCOUNTER — Encounter: Payer: Self-pay | Admitting: Physical Therapy

## 2018-07-14 DIAGNOSIS — M6281 Muscle weakness (generalized): Secondary | ICD-10-CM | POA: Diagnosis not present

## 2018-07-14 DIAGNOSIS — R293 Abnormal posture: Secondary | ICD-10-CM | POA: Diagnosis not present

## 2018-07-14 DIAGNOSIS — M545 Low back pain, unspecified: Secondary | ICD-10-CM

## 2018-07-14 DIAGNOSIS — G8929 Other chronic pain: Secondary | ICD-10-CM

## 2018-07-14 NOTE — Patient Instructions (Signed)
Access Code: [email or web address]  URL: [email or web address]  Date: 07/14/2018  Prepared by: Lovett Calender   Exercises  Seated Piriformis Stretch - 3 reps - 3 sets - 30 hold - 1x daily - 7x weekly  Seated Figure 4 Piriformis Stretch - 3 reps - 3 sets - 30 hold - 1x daily - 7x weekly  Seated Hamstring Stretch - 3 reps - 3 sets - 30 hold - 1x daily - 7x weekly  Seated Long Arc Quad - 10 reps - 3 sets - 1x daily - 7x weekly  Seated Thoracic Extension AROM - 10 reps - 1 sets - 1x daily - 7x weekly  Forward Step Up with Counter Support - 10 reps - 2 sets - 1x daily - 7x weekly  Lateral Step Up with Counter Support - 10 reps - 2 sets - 1x daily - 7x weekly

## 2018-07-14 NOTE — Therapy (Addendum)
[facility] [address] [place], [place], [address] Phone: [phone]   Fax:  [phone]  Physical Therapy Treatment  Patient Details  Name: Kevin Page MRN: [id number] Date of Birth: Jan 19, 1931 Referring Provider (PT): Renette Butters, MD   Encounter Date: 07/14/2018  PT End of Session - 07/14/18 1359    Visit Number  2    Date for PT Re-Evaluation  08/31/18    Authorization Type  [employer]    PT Start Time  1359    PT Stop Time  1440    PT Time Calculation (min)  41 min    Activity Tolerance  Patient tolerated treatment well    Behavior During Therapy  [facility] for tasks assessed/performed       Past Medical History:  Diagnosis Date  . Allergy    dust and mites  . BPH (benign prostatic hyperplasia)   . Essential hypertension   . Hyperlipidemia   . Macular degeneration   . Osteoarthritis of both knees   . Sinus bradycardia   . SVT (supraventricular tachycardia) ([place])    a. Event monitor 2014: few episodes of SVT, longest 12 beats with rate 128.  [name] Syncope     Past Surgical History:  Procedure Laterality Date  . CATARACT EXTRACTION W/ INTRAOCULAR LENS  IMPLANT, BILATERAL Bilateral 2011  . dupytron contractions  1998 , 2006   both hands  . LUMBAR FUSION  1994    There were no vitals filed for this visit.  Subjective Assessment - 07/14/18 1405    Subjective  Pt states he has not had pain for two weeks.  Pt wife is present to confirm and they report that they will cancel remaining appointments    Patient is accompained by:  Family member   wife   Patient Stated Goals  prevent back from getting worse    Currently in Pain?  No/denies                       [facility] Adult PT Treatment/Exercise - 07/14/18 0001      Neuro Re-ed    Neuro Re-ed Details   cues for posture and activation of core throughout      Lumbar Exercises: Stretches   Active Hamstring Stretch  Right;Left;5  reps;10 seconds    Lower Trunk Rotation  --   10 reps; 5 seconds   Lower Trunk Rotation Limitations  cues to go slowly and not let leg plop      Lumbar Exercises: Seated   Other Seated Lumbar Exercises  thoracic extension with ball behind back      Lumbar Exercises: Supine   Bent Knee Raise  20 reps    Bridge  15 reps;2 seconds    Straight Leg Raise  10 reps      Lumbar Exercises: Sidelying   Clam  Right;Left;10 reps   green band     Knee/Hip Exercises: Standing   Lateral Step Up  Right;Left;5 reps;Step Height: 6";Hand Hold: 2    Forward Step Up  Hand Hold: 2;10 reps;Right;Left;Step Height: 6"      Knee/Hip Exercises: Seated   Long Arc Quad  Right;Left;Strengthening;10 reps;3 sets;Weights    Long Arc Quad Weight  2 lbs.             PT Education - 07/14/18 1446    Education Details   Access Code: [email or web address]     Person(s) Educated  Patient;Spouse  Methods  Explanation;Demonstration    Comprehension  Verbalized understanding;Returned demonstration       PT Short Term Goals - 07/06/18 1655      PT SHORT TERM GOAL #1   Title  ind with initial HEP for strength, flexibility and ROM    Time  4    Period  Weeks    Status  New    Target Date  08/03/18      PT SHORT TERM GOAL #2   Title  Pt will perform 5x sit to stand in </= 22 sec to demo reduction in fall risk.    Time  4    Period  Weeks    Status  New    Target Date  08/03/18      PT SHORT TERM GOAL #3   Title  Pt will ambulate at least 100 feet without need for upright posture cueing to reduce risk of anterior fall risk.    Time  4    Period  Weeks    Status  New    Target Date  08/03/18      PT SHORT TERM GOAL #4   Title  Pt will be able to stand in SLS bil with fingertip UE support bilaterally for at least 30 sec to demo improved closed chain strength and balance.    Time  4    Period  Weeks    Status  New    Target Date  08/03/18        PT Long Term Goals - 07/06/18 1659      PT LONG TERM GOAL  #1   Title  Pt will be ind with advanced HEP and understand how to safely progress.    Time  8    Period  Weeks    Status  New    Target Date  08/31/18      PT LONG TERM GOAL #2   Title  Pt will report painfree days at least 3 days out of the week.    Time  8    Period  Weeks    Status  New    Target Date  08/31/18      PT LONG TERM GOAL #3   Title  Pt will achieve strength rating in bil LEs of at least 4/5 to improve functional task performance such as sit to stand.    Time  8    Period  Weeks    Status  New    Target Date  08/31/18      PT LONG TERM GOAL #4   Title  pt will demonstrate improved upright posture during static and dynamic postures due to improved core and hip strength     Time  8    Period  Weeks    Status  New    Target Date  08/31/18      PT LONG TERM GOAL #5   Title  Pt will demonstrate stair ascent/descent safely using bil railings to improve safety as he comes/goes from his home.     Time  8    Period  Weeks    Status  New    Target Date  08/31/18            Plan - 07/14/18 1448    Clinical Impression Statement  Pt is feeling much better and has had no pain for 2 weeks.  He demonstrates weakness in lateral Rt hip and glutes and needs constant cues to keep  upright posture . He was educated on additional exercises in HEP in order to progress strength.  Pt reports he will call to make another appointment if he needs to do so.    PT Treatment/Interventions  ADLs/Self Care Home Management;Cryotherapy;Electrical Stimulation;Moist Heat;Gait training;Stair training;Functional mobility training;Therapeutic activities;Therapeutic exercise;Balance training;Neuromuscular re-education;Patient/family education;Manual techniques;Passive range of motion;Dry needling;Energy conservation;Taping;Joint Manipulations;Spinal Manipulations    PT Next Visit Plan  progress core and hip strength, postural strength    PT Home Exercise Plan  Access Code: [email or web address]    Consulted  and Agree with Plan of Care  Patient;Family member/caregiver    Family Member Consulted  wife       Patient will benefit from skilled therapeutic intervention in order to improve the following deficits and impairments:  Abnormal gait, Decreased cognition, Improper body mechanics, Pain, Decreased coordination, Decreased mobility, Postural dysfunction, Decreased activity tolerance, Decreased endurance, Decreased range of motion, Decreased strength, Hypomobility, Decreased balance, Impaired flexibility, Impaired vision/preception  Visit Diagnosis: Chronic right-sided low back pain without sciatica  Muscle weakness (generalized)  Abnormal posture     Problem List Patient Active Problem List   Diagnosis Date Noted  . SVT (supraventricular tachycardia) ([place])   . Sinus bradycardia   . Osteoarthritis of both knees   . Macular degeneration   . Essential hypertension   . Allergy   . Advanced dry age-related macular degeneration of both eyes with subfoveal involvement 03/03/2016  . Hypertensive retinopathy of both eyes 03/03/2016  . Pseudophakia of both eyes 03/03/2016  . PVD (posterior vitreous detachment), both eyes 03/03/2016  . Orthostasis 01/19/2016  . Dilated aortic root ([place]) 01/19/2016  . PSVT (paroxysmal supraventricular tachycardia) ([place]) 01/19/2016  . Mild aortic insufficiency 01/19/2016  . Sinus bradycardia seen on cardiac monitor 01/18/2016  . Syncope 01/12/2013  . Hypertension 01/12/2013  . Hyperlipidemia 01/12/2013  . BPH (benign prostatic hyperplasia) 01/12/2013    [name], PT 07/14/2018, 3:28 PM  [facility] [address] [place], [place], [address] Phone: [phone]   Fax:  [phone]  Name: Kevin Page MRN: [id number] Date of Birth: [birth year]  PHYSICAL THERAPY DISCHARGE SUMMARY  Visits from Start of Care: 2  Current functional level related to goals / functional outcomes: See  above   Remaining deficits: See above   Education / Equipment: HEP  Plan: Patient agrees to discharge.  Patient goals were not met. Patient is being discharged due to not returning since the last visit.  ?????     [employer], PT 09/24/18 1:27 PM

## 2018-07-20 ENCOUNTER — Encounter: Payer: Medicare HMO | Admitting: Physical Therapy

## 2018-07-20 ENCOUNTER — Ambulatory Visit: Payer: Medicare HMO | Admitting: Physical Therapy

## 2018-07-21 ENCOUNTER — Encounter: Payer: Medicare HMO | Admitting: Physical Therapy

## 2018-07-26 ENCOUNTER — Encounter: Payer: Medicare HMO | Admitting: Physical Therapy

## 2018-07-28 ENCOUNTER — Encounter: Payer: Medicare HMO | Admitting: Physical Therapy

## 2018-11-02 DIAGNOSIS — D1801 Hemangioma of skin and subcutaneous tissue: Secondary | ICD-10-CM | POA: Diagnosis not present

## 2018-11-02 DIAGNOSIS — L570 Actinic keratosis: Secondary | ICD-10-CM | POA: Diagnosis not present

## 2018-11-02 DIAGNOSIS — L821 Other seborrheic keratosis: Secondary | ICD-10-CM | POA: Diagnosis not present

## 2018-11-02 DIAGNOSIS — D3612 Benign neoplasm of peripheral nerves and autonomic nervous system, upper limb, including shoulder: Secondary | ICD-10-CM | POA: Diagnosis not present

## 2018-11-02 DIAGNOSIS — L720 Epidermal cyst: Secondary | ICD-10-CM | POA: Diagnosis not present

## 2018-11-02 DIAGNOSIS — Z85828 Personal history of other malignant neoplasm of skin: Secondary | ICD-10-CM | POA: Diagnosis not present

## 2018-11-15 DIAGNOSIS — H3530 Unspecified macular degeneration: Secondary | ICD-10-CM | POA: Diagnosis not present

## 2018-11-15 DIAGNOSIS — H543 Unqualified visual loss, both eyes: Secondary | ICD-10-CM | POA: Diagnosis not present

## 2018-11-22 DIAGNOSIS — R69 Illness, unspecified: Secondary | ICD-10-CM | POA: Diagnosis not present

## 2018-12-07 DIAGNOSIS — Z20828 Contact with and (suspected) exposure to other viral communicable diseases: Secondary | ICD-10-CM | POA: Diagnosis not present

## 2018-12-14 DIAGNOSIS — E7849 Other hyperlipidemia: Secondary | ICD-10-CM | POA: Diagnosis not present

## 2018-12-14 DIAGNOSIS — M810 Age-related osteoporosis without current pathological fracture: Secondary | ICD-10-CM | POA: Diagnosis not present

## 2018-12-14 DIAGNOSIS — R55 Syncope and collapse: Secondary | ICD-10-CM | POA: Diagnosis not present

## 2018-12-16 DIAGNOSIS — R82998 Other abnormal findings in urine: Secondary | ICD-10-CM | POA: Diagnosis not present

## 2018-12-16 DIAGNOSIS — I10 Essential (primary) hypertension: Secondary | ICD-10-CM | POA: Diagnosis not present

## 2018-12-21 DIAGNOSIS — R69 Illness, unspecified: Secondary | ICD-10-CM | POA: Diagnosis not present

## 2018-12-21 DIAGNOSIS — E785 Hyperlipidemia, unspecified: Secondary | ICD-10-CM | POA: Diagnosis not present

## 2018-12-21 DIAGNOSIS — R159 Full incontinence of feces: Secondary | ICD-10-CM | POA: Diagnosis not present

## 2018-12-21 DIAGNOSIS — H3530 Unspecified macular degeneration: Secondary | ICD-10-CM | POA: Diagnosis not present

## 2018-12-21 DIAGNOSIS — L02413 Cutaneous abscess of right upper limb: Secondary | ICD-10-CM | POA: Diagnosis not present

## 2018-12-21 DIAGNOSIS — M810 Age-related osteoporosis without current pathological fracture: Secondary | ICD-10-CM | POA: Diagnosis not present

## 2018-12-21 DIAGNOSIS — Z0000 Encounter for general adult medical examination without abnormal findings: Secondary | ICD-10-CM | POA: Diagnosis not present

## 2018-12-21 DIAGNOSIS — N401 Enlarged prostate with lower urinary tract symptoms: Secondary | ICD-10-CM | POA: Diagnosis not present

## 2018-12-21 DIAGNOSIS — J449 Chronic obstructive pulmonary disease, unspecified: Secondary | ICD-10-CM | POA: Diagnosis not present

## 2018-12-21 DIAGNOSIS — I10 Essential (primary) hypertension: Secondary | ICD-10-CM | POA: Diagnosis not present

## 2018-12-23 DIAGNOSIS — L720 Epidermal cyst: Secondary | ICD-10-CM | POA: Diagnosis not present

## 2018-12-23 DIAGNOSIS — L02413 Cutaneous abscess of right upper limb: Secondary | ICD-10-CM | POA: Diagnosis not present

## 2018-12-23 DIAGNOSIS — L0889 Other specified local infections of the skin and subcutaneous tissue: Secondary | ICD-10-CM | POA: Diagnosis not present

## 2018-12-23 DIAGNOSIS — L0291 Cutaneous abscess, unspecified: Secondary | ICD-10-CM | POA: Diagnosis not present

## 2018-12-23 DIAGNOSIS — Z85828 Personal history of other malignant neoplasm of skin: Secondary | ICD-10-CM | POA: Diagnosis not present

## 2018-12-23 DIAGNOSIS — I10 Essential (primary) hypertension: Secondary | ICD-10-CM | POA: Diagnosis not present

## 2018-12-29 DIAGNOSIS — R69 Illness, unspecified: Secondary | ICD-10-CM | POA: Diagnosis not present

## 2019-01-19 DIAGNOSIS — Z23 Encounter for immunization: Secondary | ICD-10-CM | POA: Diagnosis not present

## 2019-02-21 ENCOUNTER — Encounter (HOSPITAL_COMMUNITY): Payer: Self-pay

## 2019-02-21 ENCOUNTER — Other Ambulatory Visit: Payer: Self-pay

## 2019-02-21 ENCOUNTER — Emergency Department (HOSPITAL_COMMUNITY)
Admission: EM | Admit: 2019-02-21 | Discharge: 2019-02-21 | Disposition: A | Discharge status: Home / Self Care | Payer: Medicare HMO | Attending: Emergency Medicine | Admitting: Emergency Medicine

## 2019-02-21 DIAGNOSIS — I959 Hypotension, unspecified: Secondary | ICD-10-CM | POA: Diagnosis not present

## 2019-02-21 DIAGNOSIS — Z79899 Other long term (current) drug therapy: Secondary | ICD-10-CM | POA: Diagnosis not present

## 2019-02-21 DIAGNOSIS — Z87891 Personal history of nicotine dependence: Secondary | ICD-10-CM | POA: Diagnosis not present

## 2019-02-21 DIAGNOSIS — R0689 Other abnormalities of breathing: Secondary | ICD-10-CM | POA: Diagnosis not present

## 2019-02-21 DIAGNOSIS — Z7982 Long term (current) use of aspirin: Secondary | ICD-10-CM | POA: Diagnosis not present

## 2019-02-21 DIAGNOSIS — I4891 Unspecified atrial fibrillation: Secondary | ICD-10-CM | POA: Diagnosis not present

## 2019-02-21 DIAGNOSIS — R404 Transient alteration of awareness: Secondary | ICD-10-CM

## 2019-02-21 DIAGNOSIS — I491 Atrial premature depolarization: Secondary | ICD-10-CM | POA: Diagnosis not present

## 2019-02-21 DIAGNOSIS — I10 Essential (primary) hypertension: Secondary | ICD-10-CM | POA: Insufficient documentation

## 2019-02-21 DIAGNOSIS — R531 Weakness: Secondary | ICD-10-CM | POA: Diagnosis not present

## 2019-02-21 DIAGNOSIS — R001 Bradycardia, unspecified: Secondary | ICD-10-CM | POA: Diagnosis not present

## 2019-02-21 LAB — CBC WITH DIFFERENTIAL/PLATELET
Abs Immature Granulocytes: 0.05 10*3/uL (ref 0.00–0.07)
Basophils Absolute: 0 10*3/uL (ref 0.0–0.1)
Basophils Relative: 0 %
Eosinophils Absolute: 0.1 10*3/uL (ref 0.0–0.5)
Eosinophils Relative: 1 %
HCT: 42.9 % (ref 39.0–52.0)
Hemoglobin: 13.7 g/dL (ref 13.0–17.0)
Immature Granulocytes: 1 %
Lymphocytes Relative: 9 %
Lymphs Abs: 0.9 10*3/uL (ref 0.7–4.0)
MCH: 31.5 pg (ref 26.0–34.0)
MCHC: 31.9 g/dL (ref 30.0–36.0)
MCV: 98.6 fL (ref 80.0–100.0)
Monocytes Absolute: 0.5 10*3/uL (ref 0.1–1.0)
Monocytes Relative: 6 %
Neutro Abs: 7.7 10*3/uL (ref 1.7–7.7)
Neutrophils Relative %: 83 %
Platelets: 171 10*3/uL (ref 150–400)
RBC: 4.35 MIL/uL (ref 4.22–5.81)
RDW: 13.2 % (ref 11.5–15.5)
WBC: 9.3 10*3/uL (ref 4.0–10.5)
nRBC: 0 % (ref 0.0–0.2)

## 2019-02-21 LAB — COMPREHENSIVE METABOLIC PANEL
ALT: 18 U/L (ref 0–44)
AST: 35 U/L (ref 15–41)
Albumin: 3.7 g/dL (ref 3.5–5.0)
Alkaline Phosphatase: 47 U/L (ref 38–126)
Anion gap: 11 (ref 5–15)
BUN: 15 mg/dL (ref 8–23)
CO2: 26 mmol/L (ref 22–32)
Calcium: 8.9 mg/dL (ref 8.9–10.3)
Chloride: 102 mmol/L (ref 98–111)
Creatinine, Ser: 1.03 mg/dL (ref 0.61–1.24)
GFR calc Af Amer: >60 mL/min (ref >60)
GFR calc non Af Amer: >60 mL/min (ref >60)
Glucose, Bld: 104 mg/dL — ABNORMAL HIGH (ref 70–99)
Potassium: 4.7 mmol/L (ref 3.5–5.1)
Sodium: 139 mmol/L (ref 135–145)
Total Bilirubin: 1.2 mg/dL (ref 0.3–1.2)
Total Protein: 6.2 g/dL — ABNORMAL LOW (ref 6.5–8.1)

## 2019-02-21 LAB — URINALYSIS, ROUTINE W REFLEX MICROSCOPIC
Bilirubin Urine: NEGATIVE
Glucose, UA: NEGATIVE mg/dL
Hgb urine dipstick: NEGATIVE
Ketones, ur: 5 mg/dL — AB
Leukocytes,Ua: NEGATIVE
Nitrite: NEGATIVE
Protein, ur: NEGATIVE mg/dL
Specific Gravity, Urine: 1.019 (ref 1.005–1.030)
pH: 5 (ref 5.0–8.0)

## 2019-02-21 MED ORDER — SODIUM CHLORIDE 0.9 % IV BOLUS
500.0000 mL | Freq: Once | INTRAVENOUS | Status: AC
  Administered 2019-02-21: 500 mL via INTRAVENOUS

## 2019-02-21 NOTE — ED Notes (Signed)
Sent lt green, dk green, blue and lav to lab. I also sent grey on ice, small sample though.

## 2019-02-21 NOTE — ED Triage Notes (Signed)
[facility] reports pt with weakness this am, unable to get out of bed. [facility] reports orthro HTN, and 10-15 off afib then SB (probable new onset) 140/80 sitting 90/60 standing 98% RA cbg 156 Given ~200 ml NS.

## 2019-02-21 NOTE — ED Provider Notes (Signed)
[facility] EMERGENCY DEPARTMENT Provider Note   CSN: [id number] Arrival date & time: 02/21/19  1117     History   Chief Complaint Chief Complaint  Patient presents with  . Weakness    HPI Kevin Page is a 85 y.o. male.     HPI   He is here for evaluation of a period of unresponsiveness which occurred this morning when he was sitting on his bed.  His wife is been out for a while, she returned to the house to find him sitting on the bed, then unable to stand up because of suspected weakness.  When he tried to stand up he "flopped back down."  He did not lose consciousness.  Patient apparently got up and took a shower, while his wife was out for a walk.  He has not taken any medications this morning nor eaten, yet.  There is been no recent illnesses including fever, chills, cough, shortness of breath, chest pain, nausea or vomiting.  No known sick contacts.  There are no other known modifying factors.  Past Medical History:  Diagnosis Date  . Allergy    dust and mites  . BPH (benign prostatic hyperplasia)   . Essential hypertension   . Hyperlipidemia   . Macular degeneration   . Osteoarthritis of both knees   . Sinus bradycardia   . SVT (supraventricular tachycardia) ([place])    a. Event monitor 2014: few episodes of SVT, longest 12 beats with rate 128.  [name] Syncope     Patient Active Problem List   Diagnosis Date Noted  . SVT (supraventricular tachycardia) ([place])   . Sinus bradycardia   . Osteoarthritis of both knees   . Macular degeneration   . Essential hypertension   . Allergy   . Advanced dry age-related macular degeneration of both eyes with subfoveal involvement 03/03/2016  . Hypertensive retinopathy of both eyes 03/03/2016  . Pseudophakia of both eyes 03/03/2016  . PVD (posterior vitreous detachment), both eyes 03/03/2016  . Orthostasis 01/19/2016  . Dilated aortic root ([place]) 01/19/2016  . PSVT (paroxysmal supraventricular tachycardia) ([place])  01/19/2016  . Mild aortic insufficiency 01/19/2016  . Sinus bradycardia seen on cardiac monitor 01/18/2016  . Syncope 01/12/2013  . Hypertension 01/12/2013  . Hyperlipidemia 01/12/2013  . BPH (benign prostatic hyperplasia) 01/12/2013    Past Surgical History:  Procedure Laterality Date  . CATARACT EXTRACTION W/ INTRAOCULAR LENS  IMPLANT, BILATERAL Bilateral 2011  . dupytron contractions  1998 , 2006   both hands  . LUMBAR FUSION  1994        Home Medications    Prior to Admission medications   Medication Sig Start Date End Date Taking? Authorizing Provider  alendronate (FOSAMAX) 70 MG tablet Take 70 mg by mouth every Thursday. Take with a full glass of water on an empty stomach.    [provider]  aspirin EC 81 MG tablet Take 81 mg by mouth daily.    [provider]  brimonidine (ALPHAGAN) 0.2 % ophthalmic solution Place 1 drop into both eyes at bedtime. 11/14/15   [provider]  donepezil (ARICEPT) 10 MG tablet Take 10 mg by mouth at bedtime. 03/28/17   [provider]  fluticasone (FLONASE) 50 MCG/ACT nasal spray Place 1 spray into both nostrils daily as needed for allergies or rhinitis.    [provider]  Multiple Vitamins-Minerals (MACULAR HEALTH FORMULA PO) Take 2 capsules by mouth daily.    [provider]  simvastatin (ZOCOR)  20 MG tablet Take 20 mg by mouth daily. 02/26/17   [provider]  sodium chloride (OCEAN) 0.65 % SOLN nasal spray Place 1 spray into both nostrils at bedtime as needed for congestion.    [provider]  tamsulosin (FLOMAX) 0.4 MG CAPS capsule Take 0.4 mg by mouth daily.    [provider]    Family History Family History  Problem Relation Age of Onset  . Cancer Brother        tonsils  . Heart attack Neg Hx     Social History Social History   Tobacco Use  . Smoking status: Former Research scientist (life sciences)  . Smokeless tobacco: Never Used  . Tobacco comment: Smoked for 44  years, quit 1990s  Substance Use Topics  . Alcohol use: Yes    Alcohol/week: 12.0 standard drinks    Types: 12 Standard drinks or equivalent per week    Comment: 1 drink of gin nightly, occasionally more if going out to dinner.  . Drug use: No     Allergies   Patient has no known allergies.   Review of Systems Review of Systems  All other systems reviewed and are negative.    Physical Exam Updated Vital Signs BP (!) 157/66   Pulse (!) 55   Temp 98.2 F (36.8 C) (Oral)   Resp (!) 22   Ht 6\' 1"  (1.854 m)   Wt 67.1 kg   SpO2 90%   BMI 19.53 kg/m   Physical Exam Vitals signs and nursing note reviewed.  Constitutional:      General: He is not in acute distress.    Appearance: He is well-developed. He is not ill-appearing or diaphoretic.  HENT:     Head: Normocephalic and atraumatic.     Right Ear: External ear normal.     Left Ear: External ear normal.     Nose: No congestion or rhinorrhea.     Mouth/Throat:     Mouth: Mucous membranes are moist.     Pharynx: No oropharyngeal exudate or posterior oropharyngeal erythema.  Eyes:     Conjunctiva/sclera: Conjunctivae normal.     Pupils: Pupils are equal, round, and reactive to light.  Neck:     Musculoskeletal: Normal range of motion and neck supple.     Trachea: Phonation normal.  Cardiovascular:     Rate and Rhythm: Normal rate and regular rhythm.     Heart sounds: Normal heart sounds.  Pulmonary:     Effort: Pulmonary effort is normal. No respiratory distress.     Breath sounds: Normal breath sounds. No stridor. No wheezing or rhonchi.  Abdominal:     General: There is no distension.     Palpations: Abdomen is soft.     Tenderness: There is no abdominal tenderness.  Musculoskeletal: Normal range of motion.  Skin:    General: Skin is warm and dry.  Neurological:     Mental Status: He is alert and oriented to person, place, and time.     Cranial Nerves: No cranial nerve deficit.     Sensory: No sensory  deficit.     Motor: No abnormal muscle tone.     Coordination: Coordination normal.     Comments: No dysarthria or aphasia.  No pronator drift.  No ataxia.  Psychiatric:        Mood and Affect: Mood normal.        Behavior: Behavior normal.      ED Treatments / Results  Labs (all labs  ordered are listed, but only abnormal results are displayed) Labs Reviewed  COMPREHENSIVE METABOLIC PANEL - Abnormal; Notable for the following components:      Result Value   Glucose, Bld 104 (*)    Total Protein 6.2 (*)    All other components within normal limits  URINALYSIS, ROUTINE W REFLEX MICROSCOPIC - Abnormal; Notable for the following components:   APPearance HAZY (*)    Ketones, ur 5 (*)    All other components within normal limits  CBC WITH DIFFERENTIAL/PLATELET    EKG EKG Interpretation  Date/Time:  Tuesday February 21 2019 11:33:21 EDT Ventricular Rate:  49 PR Interval:    QRS Duration: 92 QT Interval:  471 QTC Calculation: 426 R Axis:   26 Text Interpretation:  Sinus bradycardia Atrial premature complex Abnormal R-wave progression, early transition Minimal ST depression, anterolateral leads since last tracing no significant change Confirmed by Daleen Bo [phone]) on 02/21/2019 12:17:16 PM   Radiology No results found.  Procedures Procedures (including critical care time)  Medications Ordered in ED Medications  sodium chloride 0.9 % bolus 500 mL (0 mLs Intravenous Stopped 02/21/19 1452)     Initial Impression / Assessment and Plan / ED Course  I have reviewed the triage vital signs and the nursing notes.  Pertinent labs & imaging results that were available during my care of the patient were reviewed by me and considered in my medical decision making (see chart for details).  Clinical Course as of Feb 20 1553  Tue Feb 21, 2019  1538 Normal  Urinalysis, Routine w reflex microscopic(!) [EW]  1550 Essentially normal  Comprehensive metabolic panel(!) [EW]  [id number]  Normal  CBC with Differential [EW]  1550 Monocytes Relative: 6 [EW]    Clinical Course User Index [EW] Daleen Bo, MD        Patient Vitals for the past 24 hrs:  BP Temp Temp src Pulse Resp SpO2 Height Weight  02/21/19 1400 - - - - (!) 22 - - -  02/21/19 1345 (!) 157/66 - - (!) 55 15 90 % - -  02/21/19 1330 (!) 152/63 - - 63 20 97 % - -  02/21/19 1315 (!) 157/111 - - - 17 - - -  02/21/19 1300 (!) 156/71 - - (!) 50 14 100 % - -  02/21/19 1245 (!) 144/67 - - (!) 48 17 98 % - -  02/21/19 1243 - - - - - - 6\' 1"  (1.854 m) 67.1 kg  02/21/19 1230 (!) 158/100 - - (!) 48 15 98 % - -  02/21/19 1215 (!) 121/111 - - (!) 48 17 98 % - -  02/21/19 1200 (!) 151/68 - - (!) 55 19 95 % - -  02/21/19 1145 (!) 147/73 - - (!) 50 15 96 % - -  02/21/19 1134 - - - - 11 - - -  02/21/19 1133 136/64 - - (!) 46 - 99 % - -  02/21/19 1124 - 98.2 F (36.8 C) Oral - - - - -    3:54 PM Reevaluation with update and discussion. After initial assessment and treatment, an updated evaluation reveals he is calm comfortable.  He is sitting up and alert.  He has been able to eat lunch.  Findings discussed with patient, and wife, all questions answered. Daleen Bo   Medical Decision Making: Patient with transient decreased responsiveness improved spontaneously.  Screening evaluations are reassuring.  Doubt serious bacterial infection, metabolic instability or impending vascular collapse.  Patient stable  for discharge without further evaluation here in the emergency department  CRITICAL CARE-no Performed by: Daleen Bo  Nursing Notes Reviewed/ Care Coordinated Applicable Imaging Reviewed Interpretation of Laboratory Data incorporated into ED treatment  The patient appears reasonably screened and/or stabilized for discharge and I doubt any other medical condition or other [facility] requiring further screening, evaluation, or treatment in the ED at this time prior to discharge.  Plan: Home Medications-usual; Home  Treatments-rest, fluids; return here if the recommended treatment, does not improve the symptoms; Recommended follow up-PCP 1 week and as needed   Final Clinical Impressions(s) / ED Diagnoses   Final diagnoses:  Transient alteration of awareness    ED Discharge Orders    None       Daleen Bo, MD 02/21/19 1555

## 2019-02-21 NOTE — ED Notes (Signed)
DC instructions discussed with pt and wife. All questions answered. Home stable via wc

## 2019-02-21 NOTE — ED Notes (Signed)
Pt eating sandwhich with wife at bedside and tolerating well.

## 2019-02-21 NOTE — Discharge Instructions (Addendum)
Make sure that you are getting plenty of rest, and eating 3 good meals each day.  Take your medicine as directed.  Call your doctor for problems, and see him next week for checkup.

## 2019-03-01 DIAGNOSIS — R410 Disorientation, unspecified: Secondary | ICD-10-CM | POA: Diagnosis not present

## 2019-03-01 DIAGNOSIS — I951 Orthostatic hypotension: Secondary | ICD-10-CM | POA: Diagnosis not present

## 2019-03-01 DIAGNOSIS — I10 Essential (primary) hypertension: Secondary | ICD-10-CM | POA: Diagnosis not present

## 2019-03-20 DIAGNOSIS — H353134 Nonexudative age-related macular degeneration, bilateral, advanced atrophic with subfoveal involvement: Secondary | ICD-10-CM | POA: Diagnosis not present

## 2019-03-20 DIAGNOSIS — H43813 Vitreous degeneration, bilateral: Secondary | ICD-10-CM | POA: Diagnosis not present

## 2019-05-17 DIAGNOSIS — Z20828 Contact with and (suspected) exposure to other viral communicable diseases: Secondary | ICD-10-CM | POA: Diagnosis not present

## 2019-05-22 DIAGNOSIS — L720 Epidermal cyst: Secondary | ICD-10-CM | POA: Diagnosis not present

## 2019-05-22 DIAGNOSIS — L814 Other melanin hyperpigmentation: Secondary | ICD-10-CM | POA: Diagnosis not present

## 2019-05-22 DIAGNOSIS — Z85828 Personal history of other malignant neoplasm of skin: Secondary | ICD-10-CM | POA: Diagnosis not present

## 2019-05-22 DIAGNOSIS — L570 Actinic keratosis: Secondary | ICD-10-CM | POA: Diagnosis not present

## 2019-05-22 DIAGNOSIS — D225 Melanocytic nevi of trunk: Secondary | ICD-10-CM | POA: Diagnosis not present

## 2019-05-22 DIAGNOSIS — L821 Other seborrheic keratosis: Secondary | ICD-10-CM | POA: Diagnosis not present

## 2019-05-22 DIAGNOSIS — D2262 Melanocytic nevi of left upper limb, including shoulder: Secondary | ICD-10-CM | POA: Diagnosis not present

## 2019-06-02 ENCOUNTER — Ambulatory Visit: Payer: Medicare Other | Attending: Internal Medicine

## 2019-06-02 DIAGNOSIS — Z23 Encounter for immunization: Secondary | ICD-10-CM | POA: Insufficient documentation

## 2019-06-02 NOTE — Progress Notes (Signed)
   Covid-19 Vaccination Clinic  Name:  Madsen Frohn    MRN: [id number] DOB: 08/02/1930  06/02/2019  Mr. Gaertner was observed post Covid-19 immunization for 15 minutes without incidence. He was provided with Vaccine Information Sheet and instruction to access the V-Safe system.   Mr. Semenov was instructed to call 911 with any severe reactions post vaccine: [name] Difficulty breathing  . Swelling of your face and throat  . A fast heartbeat  . A bad rash all over your body  . Dizziness and weakness    Immunizations Administered    Name Date Dose VIS Date Route   Pfizer COVID-19 Vaccine 06/02/2019 10:59 AM 0.3 mL 04/28/2019 Intramuscular   Manufacturer: [employer], [employer]   Lot: [id number]   [place]: [id number]

## 2019-06-13 DIAGNOSIS — R002 Palpitations: Secondary | ICD-10-CM | POA: Insufficient documentation

## 2019-06-13 DIAGNOSIS — R9431 Abnormal electrocardiogram [ECG] [EKG]: Secondary | ICD-10-CM | POA: Diagnosis not present

## 2019-06-13 DIAGNOSIS — I10 Essential (primary) hypertension: Secondary | ICD-10-CM | POA: Diagnosis not present

## 2019-06-21 ENCOUNTER — Telehealth: Payer: Self-pay | Admitting: Cardiology

## 2019-06-21 NOTE — Telephone Encounter (Signed)
New Message  Pt's wife called and stated that she will be accompanying him on his appt due to him being severely limited physically.  Please call to confirm

## 2019-06-21 NOTE — Telephone Encounter (Signed)
Left a detailed message on the pt and wife's VM that the wife may accompany the pt to his appt with Dr. Meda Coffee on 2/5, due to gait instability and being mostly total care. Left a detailed message advising both parties to wear their mask to the appt and during the entire duration of the appt.  Left a detailed message for them to call back with any questions or concerns regarding message left.  Will update this in appt notes.

## 2019-06-22 ENCOUNTER — Ambulatory Visit: Payer: Medicare HMO | Attending: Internal Medicine

## 2019-06-22 DIAGNOSIS — Z23 Encounter for immunization: Secondary | ICD-10-CM

## 2019-06-22 NOTE — Progress Notes (Signed)
   Covid-19 Vaccination Clinic  Name:  Nash Ridpath    MRN: [id number] DOB: 1930-06-25  06/22/2019  Mr. Markovic was observed post Covid-19 immunization for 15 minutes without incidence. He was provided with Vaccine Information Sheet and instruction to access the V-Safe system.   Mr. Borak was instructed to call 911 with any severe reactions post vaccine: [name] Difficulty breathing  . Swelling of your face and throat  . A fast heartbeat  . A bad rash all over your body  . Dizziness and weakness    Immunizations Administered    Name Date Dose VIS Date Route   Pfizer COVID-19 Vaccine 06/22/2019  1:50 PM 0.3 mL 04/28/2019 Intramuscular   Manufacturer: [place]   Lot: [id number]   [place]: [id number]

## 2019-06-23 ENCOUNTER — Ambulatory Visit: Payer: Medicare HMO | Admitting: Cardiology

## 2019-06-23 ENCOUNTER — Telehealth: Payer: Self-pay | Admitting: Radiology

## 2019-06-23 ENCOUNTER — Other Ambulatory Visit: Payer: Self-pay

## 2019-06-23 ENCOUNTER — Encounter: Payer: Self-pay | Admitting: Cardiology

## 2019-06-23 VITALS — BP 126/76 | HR 79 | Ht 73.0 in | Wt 152.6 lb

## 2019-06-23 DIAGNOSIS — R55 Syncope and collapse: Secondary | ICD-10-CM

## 2019-06-23 DIAGNOSIS — R002 Palpitations: Secondary | ICD-10-CM

## 2019-06-23 DIAGNOSIS — I10 Essential (primary) hypertension: Secondary | ICD-10-CM

## 2019-06-23 DIAGNOSIS — I480 Paroxysmal atrial fibrillation: Secondary | ICD-10-CM

## 2019-06-23 DIAGNOSIS — I495 Sick sinus syndrome: Secondary | ICD-10-CM | POA: Diagnosis not present

## 2019-06-23 NOTE — Telephone Encounter (Signed)
Enrolled patient for a 14 day Zio monitor to be mailed to patients home.  

## 2019-06-23 NOTE — Patient Instructions (Signed)
Medication Instructions:   STOP TAKING ZOCOR NOW  *If you need a refill on your cardiac medications before your next appointment, please call your pharmacy*    Testing/Procedures:  ZIO XT- Long Term Monitor Instructions   Your physician has requested you wear your ZIO patch monitor__14_____days.   This is a single patch monitor.  [employer] supplies one patch monitor per enrollment.  Additional stickers are not available.   Please do not apply patch if you will be having a Nuclear Stress Test, Echocardiogram, Cardiac CT, MRI, or Chest Xray during the time frame you would be wearing the monitor. The patch cannot be worn during these tests.  You cannot remove and re-apply the ZIO XT patch monitor.   Your ZIO patch monitor will be sent USPS Priority mail from [facility] directly to your home address. The monitor may also be mailed to a PO BOX if home delivery is not available.   It may take 3-5 days to receive your monitor after you have been enrolled.   Once you have received you monitor, please review enclosed instructions.  Your monitor has already been registered assigning a specific monitor serial # to you.   Applying the monitor   Shave hair from upper left chest.   Hold abrader disc by orange tab.  Rub abrader in 40 strokes over left upper chest as indicated in your monitor instructions.   Clean area with 4 enclosed alcohol pads .  Use all pads to assure are is cleaned thoroughly.  Let dry.   Apply patch as indicated in monitor instructions.  Patch will be place under collarbone on left side of chest with arrow pointing upward.   Rub patch adhesive wings for 2 minutes.Remove white label marked "1".  Remove white label marked "2".  Rub patch adhesive wings for 2 additional minutes.   While looking in a mirror, press and release button in center of patch.  A small green light will flash 3-4 times .  This will be your only indicator the monitor has been turned on.     Do  not shower for the first 24 hours.  You may shower after the first 24 hours.   Press button if you feel a symptom. You will hear a small click.  Record Date, Time and Symptom in the Patient Log Book.   When you are ready to remove patch, follow instructions on last 2 pages of Patient Log Book.  Stick patch monitor onto last page of Patient Log Book.   Place Patient Log Book in [place] box.  Use locking tab on box and tape box closed securely.  The [employer] has [employer] on it.  Please place in mailbox as soon as possible.  Your physician should have your test results approximately 7 days after the monitor has been mailed back to [facility].   Call [place] at [phone] if you have questions regarding your ZIO XT patch monitor.  Call them immediately if you see an orange light blinking on your monitor.   If your monitor falls off in less than 4 days contact our Monitor department at [phone].  If your monitor becomes loose or falls off after 4 days call [employer] at [phone] for suggestions on securing your monitor.    Follow-Up: At [facility], you and your health needs are our priority.  As part of our continuing mission to provide you with exceptional heart care, we have created designated Provider Care Teams.  These Care Teams include your primary Cardiologist (physician) and Advanced Practice Providers (APPs -  Physician Assistants and Nurse Practitioners) who all work together to provide you with the care you need, when you need it.  Your next appointment:   3 MONTHS WITH DR. Meda Coffee IN THE OFFICE ON Sep 22, 2019 AT 10:20 AM  The format for your next appointment:   In Person  Provider:   Ena Dawley, MD

## 2019-06-23 NOTE — Progress Notes (Signed)
Cardiology Office Note:    Date:  06/23/2019   ID:  [name], DOB [birth year], MRN [id number]  PCP:  Marton Redwood, MD  Cardiologist:  No primary care provider on file.  Electrophysiologist:  None   Referring MD: Marton Redwood, MD   Reason for visit : Possible paroxysmal atrial fibrillation   History of Present Illness:    Kevin Page is a 86 y.o. male with a hx of HTN, HLD, BPH, OA, macular degeneration, habitual alcohol intake (1 drink of gin daily), former tobacco abuse (33 yrs, quit 1990s), syncope 2014 (event monitor with SB 40s-50s, brief SVT), mild AI and mildly dilated aortic root in 2014, h/o syncope in 12/2012 following a stressful day where he hadn't had a lot of fluids. 2D echo 01/12/13: EF 55-60%, grade 1 DD, mild AI, mildly dilated aortic root, mild MR, mild-mod dilated. 48 hour Holter showed episodes of sinus bradycardia with longest pause 1.8 seconds, also with afew episodes of SVT, the longest run lasting 12 beats with rate 128 BPM -> he did have recordings of HR in the 40s at that time. This was felt possibly indicative of SSS, although there was no indication for PPM at that time. He also underwent GXT that did not show chronotropic incompetence.  He was in [place] up until 01/18/16 when he had episode of syncope. Workup showed an 22mmHg drop in BP in ED with 20 beat rise in HR, suggestive of orthostasis. He was treated with IVF. His episode of syncope after standing for prolonged period of time. Telemetry showedbradycardia predominantly during sleep however also at 10 AM with heart rate down to 41. Longest R to R. 1.6 seconds and patient was not symptomatic at that time. His TSH is normal.  He had another 2 syncopal episodes in 2018 after prolonged 8 hour drive, the other after sitting in a restaurant. He didn't loose consciousness completely but saw dark circles and his wife states that he was "not present and has no recollection of the episodes till the next day".  06/23/2019  - the patient is coming after two years -the patient has been doing relatively well, he continues to have progressive memory impairment, he is followed closely by his primary care physician Dr. Manuella Ghazi, he had one episode of syncope on October 6 when he was found to be dehydrated.  He has a new episode of [employer] 4  watch that showed him few alarms indicating that he might have possible irregular heartbeat consistent with atrial fibrillation however no EKGs were performed during dose episodes.  The wife states that he overall seems less stable, holding to railings when walking in quarters however no falls since October.  He denies any chest pain or shortness of breath.  Past Medical History:  Diagnosis Date  . Allergy    dust and mites  . BPH (benign prostatic hyperplasia)   . Essential hypertension   . Hyperlipidemia   . Macular degeneration   . Osteoarthritis of both knees   . Sinus bradycardia   . SVT (supraventricular tachycardia) ([place])    a. Event monitor 2014: few episodes of SVT, longest 12 beats with rate 128.  [name] Syncope     Past Surgical History:  Procedure Laterality Date  . CATARACT EXTRACTION W/ INTRAOCULAR LENS  IMPLANT, BILATERAL Bilateral 2011  . dupytron contractions  1998 , 2006   both hands  . LUMBAR FUSION  1994    Current Medications: Current Meds  Medication Sig  . alendronate (FOSAMAX) 70  MG tablet Take 70 mg by mouth every Thursday. Take with a full glass of water on an empty stomach.  [name] aspirin EC 81 MG tablet Take 81 mg by mouth daily.  [name] donepezil (ARICEPT) 10 MG tablet Take 10 mg by mouth at bedtime.  . Multiple Vitamins-Minerals (MACULAR HEALTH FORMULA PO) Take 2 capsules by mouth daily.  . tamsulosin (FLOMAX) 0.4 MG CAPS capsule Take 0.4 mg by mouth daily.  . [DISCONTINUED] simvastatin (ZOCOR) 20 MG tablet Take 20 mg by mouth daily.     Allergies:   Patient has no known allergies.   Social History   Socioeconomic History  . Marital status: Married     Spouse name: Not on file  . Number of children: Not on file  . Years of education: Not on file  . Highest education level: Not on file  Occupational History  . Not on file  Tobacco Use  . Smoking status: Former Research scientist (life sciences)  . Smokeless tobacco: Never Used  . Tobacco comment: Smoked for 44 years, quit 1990s  Substance and Sexual Activity  . Alcohol use: Yes    Alcohol/week: 12.0 standard drinks    Types: 12 Standard drinks or equivalent per week    Comment: 1 drink of gin nightly, occasionally more if going out to dinner.  . Drug use: No  . Sexual activity: Not on file  Other Topics Concern  . Not on file  Social History Narrative  . Not on file   Social Determinants of Health   Financial Resource Strain:   . Difficulty of Paying Living Expenses: Not on file  Food Insecurity:   . Worried About Charity fundraiser in the Last Year: Not on file  . Ran Out of Food in the Last Year: Not on file  Transportation Needs:   . Lack of Transportation (Medical): Not on file  . Lack of Transportation (Non-Medical): Not on file  Physical Activity:   . Days of Exercise per Week: Not on file  . Minutes of Exercise per Session: Not on file  Stress:   . Feeling of Stress : Not on file  Social Connections:   . Frequency of Communication with Friends and Family: Not on file  . Frequency of Social Gatherings with Friends and Family: Not on file  . Attends Religious Services: Not on file  . Active Member of Clubs or Organizations: Not on file  . Attends Archivist Meetings: Not on file  . Marital Status: Not on file     Family History: The patient's family history includes Cancer in his brother. There is no history of Heart attack.  ROS:   Please see the history of present illness.    All other systems reviewed and are negative.  EKGs/Labs/Other Studies Reviewed:    The following studies were reviewed today:  EKG:  EKG is ordered today.  The ekg ordered today demonstrates SR,  normal ECG, unchanged from prior, personally reviewed.  Recent Labs: 02/21/2019: ALT 18; BUN 15; Creatinine, Ser 1.03; Hemoglobin 13.7; Platelets 171; Potassium 4.7; Sodium 139  Recent Lipid Panel No results found for: CHOL, TRIG, HDL, CHOLHDL, VLDL, LDLCALC, LDLDIRECT  Physical Exam:    VS:  BP 126/76   Pulse 79   Ht 6\' 1"  (1.854 m)   Wt 152 lb 9.6 oz (69.2 kg)   SpO2 96%   BMI 20.13 kg/m     Wt Readings from Last 3 Encounters:  06/23/19 152 lb 9.6 oz (69.2 kg)  02/21/19 148 lb (67.1 kg)  04/19/17 149 lb (67.6 kg)     GEN:  Well nourished, well developed in no acute distress HEENT: Normal NECK: No JVD; No carotid bruits LYMPHATICS: No lymphadenopathy CARDIAC: RRR, no murmurs, rubs, gallops RESPIRATORY:  Clear to auscultation without rales, wheezing or rhonchi  ABDOMEN: Soft, non-tender, non-distended MUSCULOSKELETAL:  No edema; No deformity  SKIN: Warm and dry NEUROLOGIC:  Alert and oriented x 3 PSYCHIATRIC:  Normal affect   ASSESSMENT:    1. Palpitations   2. PAF (paroxysmal atrial fibrillation) ([place])   3. Essential hypertension   4. Syncope and collapse   5. Tachycardia-bradycardia syndrome ([place])    PLAN:    In order of problems listed above:  1.  Possible paroxysmal atrial fibrillation -unfortunately he is watched did not record his EKG, will start 2-week Zio patch monitor to further evaluate.  If he does have atrial fibrillation depending on the length we would still consider to start systemic anticoagulation given his falls are still rather infrequent.  On the most recent echo he had normal LVEF and normal size of the left atrium.  2. Syncope: Only 1 more episode since the last visit.  This was in the settings of dehydration.  3. Bradycardia: monitor was ok as outlined above. Resting HR in clinic today is in the 25s. Avoid AVN blocking agents.   We will reevaluate now.  4. HTN:  Well controlled.  5. Memory loss -we will discontinue simvastatin.  The  condition and plan was discussed with his wife as the patient has moderate memory impairment.  Medication Adjustments/Labs and Tests Ordered: Current medicines are reviewed at length with the patient today.  Concerns regarding medicines are outlined above.  Orders Placed This Encounter  Procedures  . LONG TERM MONITOR (3-14 DAYS)  . EKG 12-Lead   No orders of the defined types were placed in this encounter.   Patient Instructions  Medication Instructions:   STOP TAKING ZOCOR NOW  *If you need a refill on your cardiac medications before your next appointment, please call your pharmacy*    Testing/Procedures:  ZIO XT- Long Term Monitor Instructions   Your physician has requested you wear your ZIO patch monitor__14_____days.   This is a single patch monitor.  [employer] supplies one patch monitor per enrollment.  Additional stickers are not available.   Please do not apply patch if you will be having a Nuclear Stress Test, Echocardiogram, Cardiac CT, MRI, or Chest Xray during the time frame you would be wearing the monitor. The patch cannot be worn during these tests.  You cannot remove and re-apply the ZIO XT patch monitor.   Your ZIO patch monitor will be sent USPS Priority mail from [facility] directly to your home address. The monitor may also be mailed to a PO BOX if home delivery is not available.   It may take 3-5 days to receive your monitor after you have been enrolled.   Once you have received you monitor, please review enclosed instructions.  Your monitor has already been registered assigning a specific monitor serial # to you.   Applying the monitor   Shave hair from upper left chest.   Hold abrader disc by orange tab.  Rub abrader in 40 strokes over left upper chest as indicated in your monitor instructions.   Clean area with 4 enclosed alcohol pads .  Use all pads to assure are is cleaned thoroughly.  Let dry.   Apply patch as indicated in  monitor  instructions.  Patch will be place under collarbone on left side of chest with arrow pointing upward.   Rub patch adhesive wings for 2 minutes.Remove white label marked "1".  Remove white label marked "2".  Rub patch adhesive wings for 2 additional minutes.   While looking in a mirror, press and release button in center of patch.  A small green light will flash 3-4 times .  This will be your only indicator the monitor has been turned on.     Do not shower for the first 24 hours.  You may shower after the first 24 hours.   Press button if you feel a symptom. You will hear a small click.  Record Date, Time and Symptom in the Patient Log Book.   When you are ready to remove patch, follow instructions on last 2 pages of Patient Log Book.  Stick patch monitor onto last page of Patient Log Book.   Place Patient Log Book in [place] box.  Use locking tab on box and tape box closed securely.  The [employer] has [employer] on it.  Please place in mailbox as soon as possible.  Your physician should have your test results approximately 7 days after the monitor has been mailed back to [facility].   Call [place] at [phone] if you have questions regarding your ZIO XT patch monitor.  Call them immediately if you see an orange light blinking on your monitor.   If your monitor falls off in less than 4 days contact our Monitor department at [phone].  If your monitor becomes loose or falls off after 4 days call [employer] at [phone] for suggestions on securing your monitor.    Follow-Up: At [facility], you and your health needs are our priority.  As part of our continuing mission to provide you with exceptional heart care, we have created designated Provider Care Teams.  These Care Teams include your primary Cardiologist (physician) and Advanced Practice Providers (APPs -  Physician Assistants and Nurse Practitioners) who all work together to provide you with  the care you need, when you need it.  Your next appointment:   3 MONTHS WITH DR. Meda Coffee IN THE OFFICE ON Sep 22, 2019 AT 10:20 AM  The format for your next appointment:   In Person  Provider:   Ena Dawley, MD       Signed, Ena Dawley, MD  06/23/2019 12:34 PM    [place]

## 2019-06-27 ENCOUNTER — Other Ambulatory Visit (INDEPENDENT_AMBULATORY_CARE_PROVIDER_SITE_OTHER): Payer: Medicare HMO

## 2019-06-27 DIAGNOSIS — R002 Palpitations: Secondary | ICD-10-CM

## 2019-07-20 DIAGNOSIS — R002 Palpitations: Secondary | ICD-10-CM | POA: Diagnosis not present

## 2019-09-22 ENCOUNTER — Encounter: Payer: Self-pay | Admitting: Cardiology

## 2019-09-22 ENCOUNTER — Other Ambulatory Visit: Payer: Self-pay

## 2019-09-22 ENCOUNTER — Ambulatory Visit: Payer: Medicare HMO | Admitting: Cardiology

## 2019-09-22 VITALS — BP 112/68 | HR 71 | Ht 72.0 in | Wt 152.0 lb

## 2019-09-22 DIAGNOSIS — I495 Sick sinus syndrome: Secondary | ICD-10-CM

## 2019-09-22 DIAGNOSIS — R55 Syncope and collapse: Secondary | ICD-10-CM

## 2019-09-22 DIAGNOSIS — I480 Paroxysmal atrial fibrillation: Secondary | ICD-10-CM | POA: Diagnosis not present

## 2019-09-22 NOTE — Progress Notes (Signed)
Cardiology Office Note:    Date:  09/22/2019   ID:  Kevin Page, DOB February 21, 1931, MRN [id number]  PCP:  Marton Redwood, MD  Cardiologist:  No primary care provider on file.  Electrophysiologist:  None   Referring MD: Marton Redwood, MD   Reason for visit : Follow-up for syncope  History of Present Illness:    Kevin Page is a 86 y.o. male with a hx of HTN, HLD, BPH, OA, macular degeneration, habitual alcohol intake (1 drink of gin daily), former tobacco abuse (44 yrs, quit 1990s), syncope 2014 (event monitor with SB 40s-50s, brief SVT), mild AI and mildly dilated aortic root in 2014, h/o syncope in 12/2012 following a stressful day where he hadn't had a lot of fluids. 2D echo 01/12/13: EF 55-60%, grade 1 DD, mild AI, mildly dilated aortic root, mild MR, mild-mod dilated. 48 hour Holter showed episodes of sinus bradycardia with longest pause 1.8 seconds, also with afew episodes of SVT, the longest run lasting 12 beats with rate 128 BPM -> he did have recordings of HR in the 40s at that time. This was felt possibly indicative of SSS, although there was no indication for PPM at that time. He also underwent GXT that did not show chronotropic incompetence.  He was in [place] up until 01/18/16 when he had episode of syncope. Workup showed an 16mmHg drop in BP in ED with 20 beat rise in HR, suggestive of orthostasis. He was treated with IVF. His episode of syncope after standing for prolonged period of time. Telemetry showedbradycardia predominantly during sleep however also at 10 AM with heart rate down to 41. Longest R to R. 1.6 seconds and patient was not symptomatic at that time. His TSH is normal.  He had another 2 syncopal episodes in 2018 after prolonged 8 hour drive, the other after sitting in a restaurant. He didn't loose consciousness completely but saw dark circles and his wife states that he was "not present and has no recollection of the episodes till the next day".  06/23/2019 - the patient is  coming after two years -the patient has been doing relatively well, he continues to have progressive memory impairment, he is followed closely by his primary care physician Dr. Manuella Ghazi, he had one episode of syncope on October 6 when he was found to be dehydrated.  He has a new episode of [employer] 4  watch that showed him few alarms indicating that he might have possible irregular heartbeat consistent with atrial fibrillation however no EKGs were performed during dose episodes.  The wife states that he overall seems less stable, holding to railings when walking in quarters however no falls since October.  He denies any chest pain or shortness of breath.  09/22/2019 -the patient is coming after 2 months, he underwent 14-day Zio patch monitoring that did not show any episodes of significant pauses or heart blocks and no atrial fibrillation.  His wife complains that his dementia is progressing rapidly he now has no interest in any physical activity and only walks once a day to the mailbox.  So far he has not had any falls.  No chest pain or shortness of breath no recurrent syncope.   Past Medical History:  Diagnosis Date  . Allergy    dust and mites  . BPH (benign prostatic hyperplasia)   . Essential hypertension   . Hyperlipidemia   . Macular degeneration   . Osteoarthritis of both knees   . Sinus bradycardia   . SVT (supraventricular  tachycardia) ([place])    a. Event monitor 2014: few episodes of SVT, longest 12 beats with rate 128.  [name] Syncope     Past Surgical History:  Procedure Laterality Date  . CATARACT EXTRACTION W/ INTRAOCULAR LENS  IMPLANT, BILATERAL Bilateral 2011  . dupytron contractions  1998 , 2006   both hands  . LUMBAR FUSION  1994    Current Medications: Current Meds  Medication Sig  . alendronate (FOSAMAX) 70 MG tablet Take 70 mg by mouth every Thursday. Take with a full glass of water on an empty stomach.  [name] aspirin EC 81 MG tablet Take 81 mg by mouth daily.  [name] donepezil  (ARICEPT) 10 MG tablet Take 10 mg by mouth at bedtime.  . dutasteride (AVODART) 0.5 MG capsule Take 0.5 mg by mouth daily.  . Multiple Vitamins-Minerals (MACULAR HEALTH FORMULA PO) Take 2 capsules by mouth daily.  . tamsulosin (FLOMAX) 0.4 MG CAPS capsule Take 0.4 mg by mouth daily.     Allergies:   Patient has no known allergies.   Social History   Socioeconomic History  . Marital status: Married    Spouse name: Not on file  . Number of children: Not on file  . Years of education: Not on file  . Highest education level: Not on file  Occupational History  . Not on file  Tobacco Use  . Smoking status: Former Research scientist (life sciences)  . Smokeless tobacco: Never Used  . Tobacco comment: Smoked for 44 years, quit 1990s  Substance and Sexual Activity  . Alcohol use: Yes    Alcohol/week: 12.0 standard drinks    Types: 12 Standard drinks or equivalent per week    Comment: 1 drink of gin nightly, occasionally more if going out to dinner.  . Drug use: No  . Sexual activity: Not on file  Other Topics Concern  . Not on file  Social History Narrative  . Not on file   Social Determinants of Health   Financial Resource Strain:   . Difficulty of Paying Living Expenses:   Food Insecurity:   . Worried About Charity fundraiser in the Last Year:   . Arboriculturist in the Last Year:   Transportation Needs:   . Film/video editor (Medical):   [name] Lack of Transportation (Non-Medical):   Physical Activity:   . Days of Exercise per Week:   . Minutes of Exercise per Session:   Stress:   . Feeling of Stress :   Social Connections:   . Frequency of Communication with Friends and Family:   . Frequency of Social Gatherings with Friends and Family:   . Attends Religious Services:   . Active Member of Clubs or Organizations:   . Attends Archivist Meetings:   [name] Marital Status:      Family History: The patient's family history includes Cancer in his brother. There is no history of Heart  attack.  ROS:   Please see the history of present illness.    All other systems reviewed and are negative.  EKGs/Labs/Other Studies Reviewed:    The following studies were reviewed today:  EKG:  EKG is ordered today.  The ekg ordered today demonstrates SR, normal ECG, unchanged from prior, personally reviewed.  Recent Labs: 02/21/2019: ALT 18; BUN 15; Creatinine, Ser 1.03; Hemoglobin 13.7; Platelets 171; Potassium 4.7; Sodium 139  Recent Lipid Panel No results found for: CHOL, TRIG, HDL, CHOLHDL, VLDL, LDLCALC, LDLDIRECT  Physical Exam:    VS:  BP 112/68   Pulse 71   Ht 6' (1.829 m)   Wt 152 lb (68.9 kg)   SpO2 95%   BMI 20.61 kg/m     Wt Readings from Last 3 Encounters:  09/22/19 152 lb (68.9 kg)  06/23/19 152 lb 9.6 oz (69.2 kg)  02/21/19 148 lb (67.1 kg)     GEN:  Well nourished, well developed in no acute distress HEENT: Normal NECK: No JVD; No carotid bruits LYMPHATICS: No lymphadenopathy CARDIAC: RRR, no murmurs, rubs, gallops RESPIRATORY:  Clear to auscultation without rales, wheezing or rhonchi  ABDOMEN: Soft, non-tender, non-distended MUSCULOSKELETAL:  No edema; No deformity  SKIN: Warm and dry NEUROLOGIC:  Alert and oriented x 3 PSYCHIATRIC:  Normal affect   ASSESSMENT:    1. PAF (paroxysmal atrial fibrillation) ([place])   2. Syncope and collapse   3. Tachycardia-bradycardia syndrome ([place])    PLAN:    In order of problems listed above:  1.  Possible paroxysmal atrial fibrillation -this was not documented on his 14-day event monitor.  We will continue current management.  His monitor showed:  Sinus bradycardia to sinus tachycardia (38-162 BPM, average 59 BPM).  62 short runs of SVT, the longest lasting 14 seconds.   Sinus bradycardia to sinus tachycardia (38-162 BPM, average 59 BPM). 62 short runs of SVT, the longest lasting 14 seconds. No atrial fibrillation, no pauses.  2. Syncope: No recurrent episodes, it was most probably in the settings of  dehydration.  He is encouraged to drink enough fluid and eat food rich with protein.  3. Bradycardia: No significant pauses on his monitor.  4. Memory loss -we discontinued simvastatin.  The condition and plan was discussed with his wife as the patient has moderate memory impairment.  Medication Adjustments/Labs and Tests Ordered: Current medicines are reviewed at length with the patient today.  Concerns regarding medicines are outlined above.  Orders Placed This Encounter  Procedures  . EKG 12-Lead   No orders of the defined types were placed in this encounter.   Patient Instructions  Medication Instructions:  Your physician recommends that you continue on your current medications as directed. Please refer to the Current Medication list given to you today.  *If you need a refill on your cardiac medications before your next appointment, please call your pharmacy*  Follow-Up: At [facility], you and your health needs are our priority.  As part of our continuing mission to provide you with exceptional heart care, we have created designated Provider Care Teams.  These Care Teams include your primary Cardiologist (physician) and Advanced Practice Providers (APPs -  Physician Assistants and Nurse Practitioners) who all work together to provide you with the care you need, when you need it.   Your next appointment:   6 month(s)  The format for your next appointment:   In Person  Provider:   You may see Ena Dawley, MD or one of the following Advanced Practice Providers on your designated Care Team:    Melina Copa, PA-C  Ermalinda Barrios, PA-C       Signed, Ena Dawley, MD  09/22/2019 11:24 AM    Kevin Page

## 2019-09-22 NOTE — Patient Instructions (Signed)
Medication Instructions:  Your physician recommends that you continue on your current medications as directed. Please refer to the Current Medication list given to you today.  *If you need a refill on your cardiac medications before your next appointment, please call your pharmacy*  Follow-Up: At [facility], you and your health needs are our priority.  As part of our continuing mission to provide you with exceptional heart care, we have created designated Provider Care Teams.  These Care Teams include your primary Cardiologist (physician) and Advanced Practice Providers (APPs -  Physician Assistants and Nurse Practitioners) who all work together to provide you with the care you need, when you need it.  Your next appointment:   6 month(s)  The format for your next appointment:   In Person  Provider:   You may see Katarina Nelson, MD or one of the following Advanced Practice Providers on your designated Care Team:    Dayna Dunn, PA-C  Michele Lenze, PA-C    

## 2019-10-12 DIAGNOSIS — R269 Unspecified abnormalities of gait and mobility: Secondary | ICD-10-CM | POA: Insufficient documentation

## 2019-10-12 DIAGNOSIS — M1990 Unspecified osteoarthritis, unspecified site: Secondary | ICD-10-CM | POA: Diagnosis not present

## 2019-10-12 DIAGNOSIS — R2689 Other abnormalities of gait and mobility: Secondary | ICD-10-CM | POA: Diagnosis not present

## 2019-10-12 DIAGNOSIS — R69 Illness, unspecified: Secondary | ICD-10-CM | POA: Diagnosis not present

## 2019-10-31 DIAGNOSIS — Z85828 Personal history of other malignant neoplasm of skin: Secondary | ICD-10-CM | POA: Diagnosis not present

## 2019-10-31 DIAGNOSIS — D3617 Benign neoplasm of peripheral nerves and autonomic nervous system of trunk, unspecified: Secondary | ICD-10-CM | POA: Diagnosis not present

## 2019-11-22 ENCOUNTER — Ambulatory Visit: Payer: Medicare HMO | Admitting: Podiatry

## 2019-11-22 ENCOUNTER — Encounter: Payer: Self-pay | Admitting: Podiatry

## 2019-11-22 ENCOUNTER — Other Ambulatory Visit: Payer: Self-pay

## 2019-11-22 DIAGNOSIS — B351 Tinea unguium: Secondary | ICD-10-CM | POA: Diagnosis not present

## 2019-11-22 DIAGNOSIS — L989 Disorder of the skin and subcutaneous tissue, unspecified: Secondary | ICD-10-CM | POA: Diagnosis not present

## 2019-11-22 DIAGNOSIS — M79675 Pain in left toe(s): Secondary | ICD-10-CM

## 2019-11-22 DIAGNOSIS — M79674 Pain in right toe(s): Secondary | ICD-10-CM | POA: Diagnosis not present

## 2019-11-22 NOTE — Progress Notes (Signed)
   SUBJECTIVE Patient presents to office today complaining of elongated, thickened nails that cause pain while ambulating in shoes.  He is unable to trim his own nails.  Patient also complains of a corn overlying the dorsal aspect of the fourth toe of the right foot.  It rubs with shoe gear and becomes very symptomatic with close toed shoes.  Patient is here for further evaluation and treatment.  Past Medical History:  Diagnosis Date  . Allergy    dust and mites  . BPH (benign prostatic hyperplasia)   . Essential hypertension   . Hyperlipidemia   . Macular degeneration   . Osteoarthritis of both knees   . Sinus bradycardia   . SVT (supraventricular tachycardia) ([place])    a. Event monitor 2014: few episodes of SVT, longest 12 beats with rate 128.  [name] Syncope     OBJECTIVE General Patient is awake, alert, and oriented x 3 and in no acute distress. Derm Skin is dry and supple bilateral. Negative open lesions or macerations. Remaining integument unremarkable. Nails are tender, long, thickened and dystrophic with subungual debris, consistent with onychomycosis, 1-5 bilateral. No signs of infection noted.  Hyperkeratotic corn noted overlying the PIPJ of the fourth digit right foot.  Hyperkeratotic callus lesions also noted to the bilateral feet x2 Vasc  DP and PT pedal pulses palpable bilaterally. Temperature gradient within normal limits.  Neuro Epicritic and protective threshold sensation grossly intact bilaterally.  Musculoskeletal Exam No symptomatic pedal deformities noted bilateral. Muscular strength within normal limits.  ASSESSMENT 1.  Pain due to onychomycosis of toenail 1-5 bilateral 2.  Hyperkeratotic preulcerative callus lesions bilateral 3.  Corn/callus fourth toe right foot  PLAN OF CARE 1. Patient evaluated today.  2. Instructed to maintain good pedal hygiene and foot care.  3. Mechanical debridement of nails 1-5 bilaterally performed using a nail nipper. Filed with dremel  without incident.  4.  Excisional debridement of the hyperkeratotic callus tissue was performed using a tissue nipper without incident or bleeding  5.  Return to clinic in 3 mos.    Edrick Kins, DPM [facility]  Dr. Edrick Kins, [place]                                        [place], [place] [address]                Office [phone]  Fax [phone]

## 2019-12-04 DIAGNOSIS — H43813 Vitreous degeneration, bilateral: Secondary | ICD-10-CM | POA: Diagnosis not present

## 2019-12-04 DIAGNOSIS — H353134 Nonexudative age-related macular degeneration, bilateral, advanced atrophic with subfoveal involvement: Secondary | ICD-10-CM | POA: Diagnosis not present

## 2019-12-04 DIAGNOSIS — Z961 Presence of intraocular lens: Secondary | ICD-10-CM | POA: Diagnosis not present

## 2019-12-08 ENCOUNTER — Emergency Department (HOSPITAL_COMMUNITY): Payer: Medicare HMO

## 2019-12-08 ENCOUNTER — Encounter (HOSPITAL_COMMUNITY): Payer: Self-pay | Admitting: Emergency Medicine

## 2019-12-08 ENCOUNTER — Emergency Department (HOSPITAL_COMMUNITY)
Admission: EM | Admit: 2019-12-08 | Discharge: 2019-12-08 | Disposition: A | Discharge status: Home / Self Care | Payer: Medicare HMO | Attending: Emergency Medicine | Admitting: Emergency Medicine

## 2019-12-08 ENCOUNTER — Other Ambulatory Visit: Payer: Self-pay

## 2019-12-08 DIAGNOSIS — S41112A Laceration without foreign body of left upper arm, initial encounter: Secondary | ICD-10-CM | POA: Diagnosis not present

## 2019-12-08 DIAGNOSIS — Z87891 Personal history of nicotine dependence: Secondary | ICD-10-CM | POA: Insufficient documentation

## 2019-12-08 DIAGNOSIS — G319 Degenerative disease of nervous system, unspecified: Secondary | ICD-10-CM | POA: Diagnosis not present

## 2019-12-08 DIAGNOSIS — I491 Atrial premature depolarization: Secondary | ICD-10-CM | POA: Diagnosis not present

## 2019-12-08 DIAGNOSIS — Y92002 Bathroom of unspecified non-institutional (private) residence single-family (private) house as the place of occurrence of the external cause: Secondary | ICD-10-CM | POA: Insufficient documentation

## 2019-12-08 DIAGNOSIS — W010XXA Fall on same level from slipping, tripping and stumbling without subsequent striking against object, initial encounter: Secondary | ICD-10-CM | POA: Insufficient documentation

## 2019-12-08 DIAGNOSIS — I10 Essential (primary) hypertension: Secondary | ICD-10-CM | POA: Insufficient documentation

## 2019-12-08 DIAGNOSIS — Y998 Other external cause status: Secondary | ICD-10-CM | POA: Diagnosis not present

## 2019-12-08 DIAGNOSIS — S0990XA Unspecified injury of head, initial encounter: Secondary | ICD-10-CM | POA: Insufficient documentation

## 2019-12-08 DIAGNOSIS — R0902 Hypoxemia: Secondary | ICD-10-CM | POA: Diagnosis not present

## 2019-12-08 DIAGNOSIS — S41111A Laceration without foreign body of right upper arm, initial encounter: Secondary | ICD-10-CM | POA: Diagnosis present

## 2019-12-08 DIAGNOSIS — I6782 Cerebral ischemia: Secondary | ICD-10-CM | POA: Diagnosis not present

## 2019-12-08 DIAGNOSIS — Y9389 Activity, other specified: Secondary | ICD-10-CM | POA: Diagnosis not present

## 2019-12-08 DIAGNOSIS — Z7982 Long term (current) use of aspirin: Secondary | ICD-10-CM | POA: Diagnosis not present

## 2019-12-08 DIAGNOSIS — Z79899 Other long term (current) drug therapy: Secondary | ICD-10-CM | POA: Insufficient documentation

## 2019-12-08 DIAGNOSIS — S1191XA Laceration without foreign body of unspecified part of neck, initial encounter: Secondary | ICD-10-CM | POA: Diagnosis not present

## 2019-12-08 LAB — CBC
HCT: 47.3 % (ref 39.0–52.0)
Hemoglobin: 15.2 g/dL (ref 13.0–17.0)
MCH: 31.7 pg (ref 26.0–34.0)
MCHC: 32.1 g/dL (ref 30.0–36.0)
MCV: 98.7 fL (ref 80.0–100.0)
Platelets: 224 10*3/uL (ref 150–400)
RBC: 4.79 MIL/uL (ref 4.22–5.81)
RDW: 13.7 % (ref 11.5–15.5)
WBC: 7.8 10*3/uL (ref 4.0–10.5)
nRBC: 0 % (ref 0.0–0.2)

## 2019-12-08 LAB — BASIC METABOLIC PANEL
Anion gap: 15 (ref 5–15)
BUN: 18 mg/dL (ref 8–23)
CO2: 27 mmol/L (ref 22–32)
Calcium: 9.6 mg/dL (ref 8.9–10.3)
Chloride: 100 mmol/L (ref 98–111)
Creatinine, Ser: 0.92 mg/dL (ref 0.61–1.24)
GFR calc Af Amer: >60 mL/min (ref >60)
GFR calc non Af Amer: >60 mL/min (ref >60)
Glucose, Bld: 103 mg/dL — ABNORMAL HIGH (ref 70–99)
Potassium: 4.1 mmol/L (ref 3.5–5.1)
Sodium: 142 mmol/L (ref 135–145)

## 2019-12-08 MED ORDER — TETANUS-DIPHTH-ACELL PERTUSSIS 5-2.5-18.5 LF-MCG/0.5 IM SUSP
0.5000 mL | Freq: Once | INTRAMUSCULAR | Status: AC
  Administered 2019-12-08: 0.5 mL via INTRAMUSCULAR

## 2019-12-08 MED ORDER — LIDOCAINE-EPINEPHRINE-TETRACAINE (LET) TOPICAL GEL
3.0000 mL | Freq: Once | TOPICAL | Status: AC
  Administered 2019-12-08: 3 mL via TOPICAL
  Filled 2019-12-08: qty 3

## 2019-12-08 MED ORDER — TETANUS-DIPHTH-ACELL PERTUSSIS 5-2.5-18.5 LF-MCG/0.5 IM SUSP
0.5000 mL | Freq: Once | INTRAMUSCULAR | Status: DC
  Filled 2019-12-08: qty 0.5

## 2019-12-08 NOTE — ED Triage Notes (Signed)
The patient fell through a glass shower door. The fall was unwitnessed. Currently the patient is A&O x1 and the wife is unsure if he is at baseline. He has multiple lacerations, largest to the right elbow. EMS reports the patient had ETOH today. Patient does not remember falling. HX Dementia    EMS vitals: 60 HR 88 CBG 146/87 BP 94% SPO2

## 2019-12-08 NOTE — ED Notes (Signed)
Patients wife is concerned patient has a little blood in his urine. Notified MD

## 2019-12-08 NOTE — Discharge Instructions (Addendum)
Apply antibiotic ointment to the wound daily.  Change the dressing once a day.  Return as needed for worsening symptoms

## 2019-12-08 NOTE — ED Provider Notes (Signed)
[name]   CSN: [id number] Arrival date & time: 12/08/19  2036     History Chief Complaint  Patient presents with   Fall   Laceration    Kevin Page is a 86 y.o. male.  HPI   Patient presents emergency room for evaluation after a fall.  Patient lives at home with his wife.   According to the wife the patient does have some issues with his memory and that is not unusual for him.  The history was provided by both the patient and his wife.  Patient had a fall in the bathroom.  When the wife walked in she found him lying on the floor with the glass shower door shattered.  Patient appeared to sustain a laceration around his arm.  He also appeared to his hit his head.  Patient denies any headache.  He denies any neck pain.  He does have a laceration on his right arm.  He denies any other complaints right now.  He did have 1 small gin drink this evening.  Wife states this is normal for him.  Past Medical History:  Diagnosis Date   Allergy    dust and mites   BPH (benign prostatic hyperplasia)    Essential hypertension    Hyperlipidemia    Macular degeneration    Osteoarthritis of both knees    Sinus bradycardia    SVT (supraventricular tachycardia) ([place])    a. Event monitor 2014: few episodes of SVT, longest 12 beats with rate 128.   Syncope     Patient Active Problem List   Diagnosis Date Noted   SVT (supraventricular tachycardia) ([facility])    Sinus bradycardia    Osteoarthritis of both knees    Macular degeneration    Essential hypertension    Allergy    Advanced dry age-related macular degeneration of both eyes with subfoveal involvement 03/03/2016   Hypertensive retinopathy of both eyes 03/03/2016   Pseudophakia of both eyes 03/03/2016   PVD (posterior vitreous detachment), both eyes 03/03/2016   Orthostasis 01/19/2016   Dilated aortic root ([place]) 01/19/2016   PSVT (paroxysmal supraventricular  tachycardia) ([place]) 01/19/2016   Mild aortic insufficiency 01/19/2016   Sinus bradycardia seen on cardiac monitor 01/18/2016   Syncope 01/12/2013   Hypertension 01/12/2013   Hyperlipidemia 01/12/2013   BPH (benign prostatic hyperplasia) 01/12/2013    Past Surgical History:  Procedure Laterality Date   CATARACT EXTRACTION W/ INTRAOCULAR LENS  IMPLANT, BILATERAL Bilateral 2011   dupytron contractions  1998 , 2006   both hands   LUMBAR FUSION  1994       Family History  Problem Relation Age of Onset   Cancer Brother        tonsils   Heart attack Neg Hx     Social History   Tobacco Use   Smoking status: Former Smoker   Smokeless tobacco: Never Used   Tobacco comment: Smoked for 44 years, quit 1990s  Vaping Use   Vaping Use: Never used  Substance Use Topics   Alcohol use: Yes    Alcohol/week: 12.0 standard drinks    Types: 12 Standard drinks or equivalent per week    Comment: 1 drink of gin nightly, occasionally more if going out to dinner.   Drug use: No    Home Medications Prior to Admission medications   Medication Sig Start Date End Date Taking? Authorizing Provider  alendronate (FOSAMAX) 70 MG tablet Take 70 mg by mouth every Thursday.  Take with a full glass of water on an empty stomach.    [provider]  aspirin EC 81 MG tablet Take 81 mg by mouth daily.    [provider]  donepezil (ARICEPT) 10 MG tablet Take 10 mg by mouth at bedtime. 03/28/17   [provider]  dutasteride (AVODART) 0.5 MG capsule Take 0.5 mg by mouth daily.    [provider]  Multiple Vitamins-Minerals (MACULAR HEALTH FORMULA PO) Take 2 capsules by mouth daily.    [provider]  tamsulosin (FLOMAX) 0.4 MG CAPS capsule Take 0.4 mg by mouth daily.    [provider]    Allergies    Patient has no known allergies.  Review of Systems   Review of Systems  All other systems reviewed and are negative.   Physical  Exam Updated Vital Signs BP (!) 156/76 (BP Location: Right Arm)    Pulse 65    Temp 98.3 F (36.8 C) (Oral)    Resp (!) 27    SpO2 96%   Physical Exam Vitals and nursing note reviewed.  Constitutional:      General: He is not in acute distress.    Appearance: He is well-developed.  HENT:     Head: Normocephalic and atraumatic.     Right Ear: External ear normal.     Left Ear: External ear normal.  Eyes:     General: No scleral icterus.       Right eye: No discharge.        Left eye: No discharge.     Conjunctiva/sclera: Conjunctivae normal.  Neck:     Trachea: No tracheal deviation.  Cardiovascular:     Rate and Rhythm: Normal rate and regular rhythm.  Pulmonary:     Effort: Pulmonary effort is normal. No respiratory distress.     Breath sounds: Normal breath sounds. No stridor. No wheezing or rales.  Abdominal:     General: Bowel sounds are normal. There is no distension.     Palpations: Abdomen is soft.     Tenderness: There is no abdominal tenderness. There is no guarding or rebound.  Musculoskeletal:        General: No tenderness.     Cervical back: Neck supple.  Skin:    General: Skin is warm and dry.     Findings: No rash.  Neurological:     Mental Status: He is alert.     Cranial Nerves: No cranial nerve deficit (no facial droop, extraocular movements intact, no slurred speech).     Sensory: No sensory deficit.     Motor: No abnormal muscle tone or seizure activity.     Coordination: Coordination normal.     ED Results / Procedures / Treatments   Labs (all labs ordered are listed, but only abnormal results are displayed) Labs Reviewed  BASIC METABOLIC PANEL - Abnormal; Notable for the following components:      Result Value   Glucose, Bld 103 (*)    All other components within normal limits  CBC    EKG EKG Interpretation  Date/Time:  Friday December 08 2019 20:59:33 EDT Ventricular Rate:  62 PR Interval:    QRS Duration: 98 QT Interval:  414 QTC  Calculation: 421 R Axis:   30 Text Interpretation: Sinus arrhythmia Abnormal R-wave progression, early transition Borderline T abnormalities, lateral leads No significant change since last tracing Confirmed by Dorie Rank [phone]) on 12/08/2019 10:43:48 PM   Radiology CT Head Wo Contrast  Result Date: 12/08/2019 CLINICAL DATA:  [place] through glass shattered door, multiple abrasions, dimension EXAM: CT HEAD WITHOUT CONTRAST TECHNIQUE: Contiguous axial images were obtained from the base of the skull through the vertex without intravenous contrast. COMPARISON:  None. FINDINGS: Brain: There is diffuse cerebral atrophy with ex vacuo dilatation of the lateral ventricles. Chronic appearing small vessel ischemic changes are seen within the bilateral basal ganglia. No sign of acute infarct or hemorrhage. Remaining midline structures are unremarkable. No acute extra-axial fluid collections. No mass effect. Vascular: No hyperdense vessel or unexpected calcification. Skull: Normal. Negative for fracture or focal lesion. Sinuses/Orbits: No acute finding. Other: None. IMPRESSION: 1. Diffuse cerebral atrophy.  No acute intracranial process. Electronically Signed   By: Randa Ngo M.D.   On: 12/08/2019 22:03   CT Cervical Spine Wo Contrast  Result Date: 12/08/2019 CLINICAL DATA:  [place] through a glass shower door, multiple lacerations EXAM: CT CERVICAL SPINE WITHOUT CONTRAST TECHNIQUE: Multidetector CT imaging of the cervical spine was performed without intravenous contrast. Multiplanar CT image reconstructions were also generated. COMPARISON:  None. FINDINGS: Alignment: Mild anterolisthesis of C7 on T1, otherwise alignment is grossly anatomic. Skull base and vertebrae: No acute displaced fractures. Soft tissues and spinal canal: No prevertebral fluid or swelling. No visible canal hematoma. Disc levels: There is extensive multilevel cervical spondylosis and facet hypertrophy, with disc space narrowing and osteophyte  formation most pronounced at C3-4. Upper chest: Airway is patent.  Lung apices are clear. Other: Reconstructed images demonstrate no additional findings. IMPRESSION: 1. Extensive multilevel cervical spondylosis and facet hypertrophy. No acute fracture. Electronically Signed   By: Randa Ngo M.D.   On: 12/08/2019 22:07    Procedures .Marland KitchenLaceration Repair  Date/Time: 12/08/2019 11:08 PM Performed by: Dorie [place], MD Authorized by: Dorie Rank, MD   Consent:    Consent obtained:  Verbal   Consent given by:  Patient and spouse Universal protocol:    Patient identity confirmed:  Verbally with patient Anesthesia (see MAR for exact dosages):    Anesthesia method:  Topical application   Topical anesthetic:  Lidocaine gel Laceration details:    Length (cm):  3 Comments:     Avulsed skin sharply debrided. No sutures placed.  Abx dressing to wound   (including critical care time)  Medications Ordered in ED Medications  lidocaine-EPINEPHrine-tetracaine (LET) topical gel (3 mLs Topical Given 12/08/19 2216)  Tdap (BOOSTRIX) injection 0.5 mL (0.5 mLs Intramuscular Given 12/08/19 2216)    ED Course  I have reviewed the triage vital signs and the nursing notes.  Pertinent labs & imaging results that were available during my care of the patient were reviewed by me and considered in my medical decision making (see chart for details).    MDM Rules/Calculators/A&P                          Patient presents the ED for evaluation after falling through a glass shower door.  Patient has dementia and has some issues with his memory.  He is alert and awake and appropriate in the ED.  ED work-up is reassuring.  Laboratory tests are unremarkable.  CT scan of the head and C-spine do not show any acute injuries.  Patient did have a skin tear type laceration on his right forearm.  I debrided the wound as it was not amenable to suturing and applied an antibiotic ointment/dressing.  Pt is stable for dc Final  Clinical Impression(s) / ED Diagnoses Final diagnoses:  Laceration of left upper extremity, initial encounter    Rx / DC Orders ED Discharge Orders    None       Dorie Rank, MD 12/08/19 2310

## 2019-12-11 DIAGNOSIS — L821 Other seborrheic keratosis: Secondary | ICD-10-CM | POA: Diagnosis not present

## 2019-12-11 DIAGNOSIS — D225 Melanocytic nevi of trunk: Secondary | ICD-10-CM | POA: Diagnosis not present

## 2019-12-11 DIAGNOSIS — D2261 Melanocytic nevi of right upper limb, including shoulder: Secondary | ICD-10-CM | POA: Diagnosis not present

## 2019-12-11 DIAGNOSIS — Z85828 Personal history of other malignant neoplasm of skin: Secondary | ICD-10-CM | POA: Diagnosis not present

## 2019-12-11 DIAGNOSIS — L570 Actinic keratosis: Secondary | ICD-10-CM | POA: Diagnosis not present

## 2019-12-11 DIAGNOSIS — L814 Other melanin hyperpigmentation: Secondary | ICD-10-CM | POA: Diagnosis not present

## 2019-12-11 DIAGNOSIS — D2222 Melanocytic nevi of left ear and external auricular canal: Secondary | ICD-10-CM | POA: Diagnosis not present

## 2019-12-11 DIAGNOSIS — D2262 Melanocytic nevi of left upper limb, including shoulder: Secondary | ICD-10-CM | POA: Diagnosis not present

## 2020-02-08 DIAGNOSIS — R69 Illness, unspecified: Secondary | ICD-10-CM | POA: Diagnosis not present

## 2020-02-21 ENCOUNTER — Ambulatory Visit: Payer: Medicare HMO | Admitting: Podiatry

## 2020-02-21 ENCOUNTER — Encounter: Payer: Self-pay | Admitting: Podiatry

## 2020-02-21 ENCOUNTER — Other Ambulatory Visit: Payer: Self-pay

## 2020-02-21 DIAGNOSIS — M79674 Pain in right toe(s): Secondary | ICD-10-CM

## 2020-02-21 DIAGNOSIS — M79675 Pain in left toe(s): Secondary | ICD-10-CM

## 2020-02-21 DIAGNOSIS — B351 Tinea unguium: Secondary | ICD-10-CM

## 2020-02-21 NOTE — Progress Notes (Signed)
This patient returns to the office for evaluation and treatment of long thick painful nails .  This patient is unable to trim his own nails since the patient cannot reach his feet.  Patient says the nails are painful walking and wearing his shoes. Patient presents to the office with his wife. He returns for preventive foot care services.  General Appearance  Alert, conversant and in no acute stress.  Vascular  Dorsalis pedis  are palpable  bilaterally. Posterior tibial pulses are weakly palpable. Capillary return is within normal limits  bilaterally. Temperature is within normal limits  bilaterally.  Neurologic  Senn-Weinstein monofilament wire test within normal limits  bilaterally. Muscle power within normal limits bilaterally.  Nails Thick disfigured discolored nails with subungual debris  from hallux to fifth toes bilaterally. No evidence of bacterial infection or drainage bilaterally.  Orthopedic  No limitations of motion  feet .  No crepitus or effusions noted.  No bony pathology or digital deformities noted.  Skin  normotropic skin with no porokeratosis noted bilaterally.  No signs of infections or ulcers noted.     Onychomycosis  Pain in toes right foot  Pain in toes left foot  Debridement  of nails  1-5  B/L with a nail nipper.  Nails were then filed using a dremel tool with no incidents.    RTC  3 months    Gardiner Barefoot DPM

## 2020-02-23 DIAGNOSIS — Z125 Encounter for screening for malignant neoplasm of prostate: Secondary | ICD-10-CM | POA: Diagnosis not present

## 2020-02-23 DIAGNOSIS — M810 Age-related osteoporosis without current pathological fracture: Secondary | ICD-10-CM | POA: Diagnosis not present

## 2020-02-23 DIAGNOSIS — I10 Essential (primary) hypertension: Secondary | ICD-10-CM | POA: Diagnosis not present

## 2020-02-23 DIAGNOSIS — E785 Hyperlipidemia, unspecified: Secondary | ICD-10-CM | POA: Diagnosis not present

## 2020-02-29 DIAGNOSIS — Z0000 Encounter for general adult medical examination without abnormal findings: Secondary | ICD-10-CM | POA: Diagnosis not present

## 2020-02-29 DIAGNOSIS — N401 Enlarged prostate with lower urinary tract symptoms: Secondary | ICD-10-CM | POA: Diagnosis not present

## 2020-02-29 DIAGNOSIS — R001 Bradycardia, unspecified: Secondary | ICD-10-CM | POA: Diagnosis not present

## 2020-02-29 DIAGNOSIS — Z1339 Encounter for screening examination for other mental health and behavioral disorders: Secondary | ICD-10-CM | POA: Diagnosis not present

## 2020-02-29 DIAGNOSIS — R82998 Other abnormal findings in urine: Secondary | ICD-10-CM | POA: Diagnosis not present

## 2020-02-29 DIAGNOSIS — M1990 Unspecified osteoarthritis, unspecified site: Secondary | ICD-10-CM | POA: Diagnosis not present

## 2020-02-29 DIAGNOSIS — Z1331 Encounter for screening for depression: Secondary | ICD-10-CM | POA: Diagnosis not present

## 2020-02-29 DIAGNOSIS — E785 Hyperlipidemia, unspecified: Secondary | ICD-10-CM | POA: Diagnosis not present

## 2020-02-29 DIAGNOSIS — Z8673 Personal history of transient ischemic attack (TIA), and cerebral infarction without residual deficits: Secondary | ICD-10-CM | POA: Diagnosis not present

## 2020-02-29 DIAGNOSIS — I10 Essential (primary) hypertension: Secondary | ICD-10-CM | POA: Diagnosis not present

## 2020-02-29 DIAGNOSIS — R2689 Other abnormalities of gait and mobility: Secondary | ICD-10-CM | POA: Diagnosis not present

## 2020-02-29 DIAGNOSIS — M810 Age-related osteoporosis without current pathological fracture: Secondary | ICD-10-CM | POA: Diagnosis not present

## 2020-02-29 DIAGNOSIS — R69 Illness, unspecified: Secondary | ICD-10-CM | POA: Diagnosis not present

## 2020-03-25 DIAGNOSIS — Z1152 Encounter for screening for COVID-19: Secondary | ICD-10-CM | POA: Diagnosis not present

## 2020-05-29 ENCOUNTER — Encounter: Payer: Self-pay | Admitting: Podiatry

## 2020-05-29 ENCOUNTER — Other Ambulatory Visit: Payer: Self-pay

## 2020-05-29 ENCOUNTER — Ambulatory Visit: Payer: Medicare HMO | Admitting: Podiatry

## 2020-05-29 DIAGNOSIS — M79674 Pain in right toe(s): Secondary | ICD-10-CM | POA: Diagnosis not present

## 2020-05-29 DIAGNOSIS — B351 Tinea unguium: Secondary | ICD-10-CM

## 2020-05-29 DIAGNOSIS — M79675 Pain in left toe(s): Secondary | ICD-10-CM | POA: Diagnosis not present

## 2020-05-29 NOTE — Progress Notes (Addendum)
This patient returns to the office for evaluation and treatment of long thick painful nails .  This patient is unable to trim his own nails since the patient cannot reach his feet.  Patient says the nails are painful walking and wearing his shoes. Patient presents to the office with his wife. He returns for preventive foot care services.  General Appearance  Alert, conversant and in no acute stress.  Vascular  Dorsalis pedis  are palpable  bilaterally. Posterior tibial pulses are weakly palpable. Capillary return is within normal limits  bilaterally. Cold feet  Bilaterally.  Absent digital hair  B/L.  Neurologic  Senn-Weinstein monofilament wire test within normal limits  bilaterally. Muscle power within normal limits bilaterally.  Nails Thick disfigured discolored nails with subungual debris  from hallux to fifth toes bilaterally. No evidence of bacterial infection or drainage bilaterally.  Orthopedic  No limitations of motion  feet .  No crepitus or effusions noted.  No bony pathology or digital deformities noted.  Skin  normotropic skin with no porokeratosis noted bilaterally.  No signs of infections or ulcers noted.     Onychomycosis  Pain in toes right foot  Pain in toes left foot  Debridement  of nails  1-5  B/L with a nail nipper.  Nails were then filed using a dremel tool with no incidents.    RTC  3 months    Gardiner Barefoot DPM

## 2020-08-28 ENCOUNTER — Ambulatory Visit: Payer: Medicare HMO | Admitting: Podiatry

## 2020-10-07 DIAGNOSIS — H43813 Vitreous degeneration, bilateral: Secondary | ICD-10-CM | POA: Diagnosis not present

## 2020-10-07 DIAGNOSIS — H353134 Nonexudative age-related macular degeneration, bilateral, advanced atrophic with subfoveal involvement: Secondary | ICD-10-CM | POA: Diagnosis not present

## 2020-10-07 DIAGNOSIS — H35363 Drusen (degenerative) of macula, bilateral: Secondary | ICD-10-CM | POA: Diagnosis not present

## 2020-10-10 DIAGNOSIS — M25512 Pain in left shoulder: Secondary | ICD-10-CM | POA: Diagnosis not present

## 2020-10-18 DIAGNOSIS — M25512 Pain in left shoulder: Secondary | ICD-10-CM | POA: Diagnosis not present

## 2020-10-24 DIAGNOSIS — R634 Abnormal weight loss: Secondary | ICD-10-CM | POA: Diagnosis not present

## 2020-10-24 DIAGNOSIS — R0982 Postnasal drip: Secondary | ICD-10-CM | POA: Diagnosis not present

## 2020-10-24 DIAGNOSIS — R69 Illness, unspecified: Secondary | ICD-10-CM | POA: Diagnosis not present

## 2020-10-24 DIAGNOSIS — M25512 Pain in left shoulder: Secondary | ICD-10-CM | POA: Diagnosis not present

## 2020-10-24 DIAGNOSIS — F0280 Dementia in other diseases classified elsewhere without behavioral disturbance: Secondary | ICD-10-CM | POA: Diagnosis not present

## 2020-10-29 DIAGNOSIS — L03116 Cellulitis of left lower limb: Secondary | ICD-10-CM | POA: Diagnosis not present

## 2020-12-16 DIAGNOSIS — L821 Other seborrheic keratosis: Secondary | ICD-10-CM | POA: Diagnosis not present

## 2020-12-16 DIAGNOSIS — Z85828 Personal history of other malignant neoplasm of skin: Secondary | ICD-10-CM | POA: Diagnosis not present

## 2020-12-16 DIAGNOSIS — L570 Actinic keratosis: Secondary | ICD-10-CM | POA: Diagnosis not present

## 2020-12-16 DIAGNOSIS — L814 Other melanin hyperpigmentation: Secondary | ICD-10-CM | POA: Diagnosis not present

## 2020-12-16 DIAGNOSIS — D225 Melanocytic nevi of trunk: Secondary | ICD-10-CM | POA: Diagnosis not present

## 2020-12-16 DIAGNOSIS — D1801 Hemangioma of skin and subcutaneous tissue: Secondary | ICD-10-CM | POA: Diagnosis not present

## 2020-12-20 ENCOUNTER — Other Ambulatory Visit: Payer: Self-pay

## 2020-12-20 ENCOUNTER — Encounter: Payer: Self-pay | Admitting: Podiatry

## 2020-12-20 ENCOUNTER — Ambulatory Visit (INDEPENDENT_AMBULATORY_CARE_PROVIDER_SITE_OTHER): Payer: Medicare HMO | Admitting: Podiatry

## 2020-12-20 DIAGNOSIS — M79674 Pain in right toe(s): Secondary | ICD-10-CM | POA: Diagnosis not present

## 2020-12-20 DIAGNOSIS — B351 Tinea unguium: Secondary | ICD-10-CM | POA: Diagnosis not present

## 2020-12-20 DIAGNOSIS — M79675 Pain in left toe(s): Secondary | ICD-10-CM

## 2020-12-22 NOTE — Progress Notes (Signed)
Subjective: Kevin Page is a pleasant 87 y.o. male patient seen today painful thick toenails that are difficult to trim. Pain interferes with ambulation. Aggravating factors include wearing enclosed shoe gear. Pain is relieved with periodic professional debridement.  His wife is present during today's visit.  PCP is Ginger Organ., MD. Last visit was: six months ago.  No Known Allergies  Objective: Physical Exam  General: Kevin Page is a pleasant 87 y.o. Caucasian male, in NAD. AAO x 3.   Vascular:  Capillary refill time to digits immediate b/l. Palpable DP pulse(s) b/l lower extremities Faintly palpable PT pulse(s) b/l lower extremities. Pedal hair absent. Lower extremity skin temperature gradient warm to cool.  Dermatological:  Pedal skin is thin shiny, atrophic b/l lower extremities. No open wounds b/l lower extremities. No interdigital macerations b/l lower extremities. Toenails 1-5 b/l elongated, discolored, dystrophic, thickened, crumbly with subungual debris and tenderness to dorsal palpation. No hyperkeratotic nor porokeratotic lesions present on today's visit.  Musculoskeletal:  Normal muscle strength 5/5 to all lower extremity muscle groups bilaterally. Hallux valgus with bunion deformity noted b/l lower extremities. Hammertoe(s) noted to the 2-5 bilaterally.  Neurological:  Protective sensation intact 5/5 intact bilaterally with 10g monofilament b/l.  Assessment and Plan:  1. Pain due to onychomycosis of toenails of both feet   -Examined patient. -Patient to continue soft, supportive shoe gear daily. -Toenails 1-5 b/l were debrided in length and girth with sterile nail nippers and dremel without iatrogenic bleeding.  -Patient to report any pedal injuries to medical professional immediately. -Patient/POA to call should there be question/concern in the interim.  Return in about 3 months (around 03/22/2021).  Marzetta Board, DPM

## 2021-01-10 DIAGNOSIS — G8929 Other chronic pain: Secondary | ICD-10-CM | POA: Diagnosis not present

## 2021-01-10 DIAGNOSIS — J449 Chronic obstructive pulmonary disease, unspecified: Secondary | ICD-10-CM | POA: Diagnosis not present

## 2021-01-10 DIAGNOSIS — I10 Essential (primary) hypertension: Secondary | ICD-10-CM | POA: Diagnosis not present

## 2021-01-10 DIAGNOSIS — M170 Bilateral primary osteoarthritis of knee: Secondary | ICD-10-CM | POA: Diagnosis not present

## 2021-01-10 DIAGNOSIS — M25512 Pain in left shoulder: Secondary | ICD-10-CM | POA: Diagnosis not present

## 2021-01-10 DIAGNOSIS — R69 Illness, unspecified: Secondary | ICD-10-CM | POA: Diagnosis not present

## 2021-01-10 DIAGNOSIS — Z87891 Personal history of nicotine dependence: Secondary | ICD-10-CM | POA: Diagnosis not present

## 2021-01-16 DIAGNOSIS — J449 Chronic obstructive pulmonary disease, unspecified: Secondary | ICD-10-CM | POA: Diagnosis not present

## 2021-01-16 DIAGNOSIS — R69 Illness, unspecified: Secondary | ICD-10-CM | POA: Diagnosis not present

## 2021-01-16 DIAGNOSIS — Z87891 Personal history of nicotine dependence: Secondary | ICD-10-CM | POA: Diagnosis not present

## 2021-01-16 DIAGNOSIS — M25512 Pain in left shoulder: Secondary | ICD-10-CM | POA: Diagnosis not present

## 2021-01-16 DIAGNOSIS — G8929 Other chronic pain: Secondary | ICD-10-CM | POA: Diagnosis not present

## 2021-01-16 DIAGNOSIS — M170 Bilateral primary osteoarthritis of knee: Secondary | ICD-10-CM | POA: Diagnosis not present

## 2021-01-16 DIAGNOSIS — I10 Essential (primary) hypertension: Secondary | ICD-10-CM | POA: Diagnosis not present

## 2021-01-17 DIAGNOSIS — G8929 Other chronic pain: Secondary | ICD-10-CM | POA: Diagnosis not present

## 2021-01-17 DIAGNOSIS — J449 Chronic obstructive pulmonary disease, unspecified: Secondary | ICD-10-CM | POA: Diagnosis not present

## 2021-01-17 DIAGNOSIS — M170 Bilateral primary osteoarthritis of knee: Secondary | ICD-10-CM | POA: Diagnosis not present

## 2021-01-17 DIAGNOSIS — Z87891 Personal history of nicotine dependence: Secondary | ICD-10-CM | POA: Diagnosis not present

## 2021-01-17 DIAGNOSIS — R69 Illness, unspecified: Secondary | ICD-10-CM | POA: Diagnosis not present

## 2021-01-17 DIAGNOSIS — I10 Essential (primary) hypertension: Secondary | ICD-10-CM | POA: Diagnosis not present

## 2021-01-17 DIAGNOSIS — M25512 Pain in left shoulder: Secondary | ICD-10-CM | POA: Diagnosis not present

## 2021-01-21 DIAGNOSIS — G8929 Other chronic pain: Secondary | ICD-10-CM | POA: Diagnosis not present

## 2021-01-21 DIAGNOSIS — Z87891 Personal history of nicotine dependence: Secondary | ICD-10-CM | POA: Diagnosis not present

## 2021-01-21 DIAGNOSIS — I10 Essential (primary) hypertension: Secondary | ICD-10-CM | POA: Diagnosis not present

## 2021-01-21 DIAGNOSIS — R69 Illness, unspecified: Secondary | ICD-10-CM | POA: Diagnosis not present

## 2021-01-21 DIAGNOSIS — M170 Bilateral primary osteoarthritis of knee: Secondary | ICD-10-CM | POA: Diagnosis not present

## 2021-01-21 DIAGNOSIS — J449 Chronic obstructive pulmonary disease, unspecified: Secondary | ICD-10-CM | POA: Diagnosis not present

## 2021-01-21 DIAGNOSIS — M25512 Pain in left shoulder: Secondary | ICD-10-CM | POA: Diagnosis not present

## 2021-01-22 ENCOUNTER — Encounter (HOSPITAL_BASED_OUTPATIENT_CLINIC_OR_DEPARTMENT_OTHER): Payer: Self-pay

## 2021-01-22 ENCOUNTER — Inpatient Hospital Stay (HOSPITAL_BASED_OUTPATIENT_CLINIC_OR_DEPARTMENT_OTHER)
Admission: EM | Admit: 2021-01-22 | Discharge: 2021-01-30 | DRG: 378 | Disposition: A | Discharge status: Skilled Nursing Facility | Payer: Medicare HMO | Attending: Hospitalist | Admitting: Hospitalist

## 2021-01-22 ENCOUNTER — Other Ambulatory Visit: Payer: Self-pay

## 2021-01-22 DIAGNOSIS — K5731 Diverticulosis of large intestine without perforation or abscess with bleeding: Principal | ICD-10-CM

## 2021-01-22 DIAGNOSIS — F0390 Unspecified dementia without behavioral disturbance: Secondary | ICD-10-CM

## 2021-01-22 DIAGNOSIS — Z87891 Personal history of nicotine dependence: Secondary | ICD-10-CM

## 2021-01-22 DIAGNOSIS — Z20822 Contact with and (suspected) exposure to covid-19: Secondary | ICD-10-CM | POA: Diagnosis not present

## 2021-01-22 DIAGNOSIS — K625 Hemorrhage of anus and rectum: Secondary | ICD-10-CM | POA: Diagnosis present

## 2021-01-22 DIAGNOSIS — I959 Hypotension, unspecified: Secondary | ICD-10-CM | POA: Diagnosis present

## 2021-01-22 DIAGNOSIS — H3530 Unspecified macular degeneration: Secondary | ICD-10-CM | POA: Diagnosis not present

## 2021-01-22 DIAGNOSIS — K649 Unspecified hemorrhoids: Secondary | ICD-10-CM | POA: Diagnosis not present

## 2021-01-22 DIAGNOSIS — K922 Gastrointestinal hemorrhage, unspecified: Secondary | ICD-10-CM

## 2021-01-22 DIAGNOSIS — Z7982 Long term (current) use of aspirin: Secondary | ICD-10-CM | POA: Diagnosis not present

## 2021-01-22 DIAGNOSIS — Z808 Family history of malignant neoplasm of other organs or systems: Secondary | ICD-10-CM | POA: Diagnosis not present

## 2021-01-22 DIAGNOSIS — R69 Illness, unspecified: Secondary | ICD-10-CM | POA: Diagnosis not present

## 2021-01-22 DIAGNOSIS — Z981 Arthrodesis status: Secondary | ICD-10-CM

## 2021-01-22 DIAGNOSIS — N400 Enlarged prostate without lower urinary tract symptoms: Secondary | ICD-10-CM | POA: Diagnosis not present

## 2021-01-22 DIAGNOSIS — Z79899 Other long term (current) drug therapy: Secondary | ICD-10-CM | POA: Diagnosis not present

## 2021-01-22 DIAGNOSIS — R627 Adult failure to thrive: Secondary | ICD-10-CM | POA: Diagnosis present

## 2021-01-22 DIAGNOSIS — R4189 Other symptoms and signs involving cognitive functions and awareness: Secondary | ICD-10-CM

## 2021-01-22 DIAGNOSIS — I10 Essential (primary) hypertension: Secondary | ICD-10-CM | POA: Diagnosis present

## 2021-01-22 DIAGNOSIS — Z7983 Long term (current) use of bisphosphonates: Secondary | ICD-10-CM

## 2021-01-22 DIAGNOSIS — E785 Hyperlipidemia, unspecified: Secondary | ICD-10-CM | POA: Diagnosis not present

## 2021-01-22 DIAGNOSIS — Z781 Physical restraint status: Secondary | ICD-10-CM | POA: Diagnosis not present

## 2021-01-22 DIAGNOSIS — R001 Bradycardia, unspecified: Secondary | ICD-10-CM | POA: Diagnosis not present

## 2021-01-22 DIAGNOSIS — R059 Cough, unspecified: Secondary | ICD-10-CM

## 2021-01-22 DIAGNOSIS — D62 Acute posthemorrhagic anemia: Secondary | ICD-10-CM | POA: Diagnosis not present

## 2021-01-22 DIAGNOSIS — K921 Melena: Secondary | ICD-10-CM | POA: Diagnosis not present

## 2021-01-22 LAB — CBC WITH DIFFERENTIAL/PLATELET
Abs Immature Granulocytes: 0.02 10*3/uL (ref 0.00–0.07)
Basophils Absolute: 0 10*3/uL (ref 0.0–0.1)
Basophils Relative: 1 %
Eosinophils Absolute: 0.2 10*3/uL (ref 0.0–0.5)
Eosinophils Relative: 3 %
HCT: 40.5 % (ref 39.0–52.0)
Hemoglobin: 13.4 g/dL (ref 13.0–17.0)
Immature Granulocytes: 0 %
Lymphocytes Relative: 20 %
Lymphs Abs: 1.4 10*3/uL (ref 0.7–4.0)
MCH: 31.2 pg (ref 26.0–34.0)
MCHC: 33.1 g/dL (ref 30.0–36.0)
MCV: 94.2 fL (ref 80.0–100.0)
Monocytes Absolute: 0.5 10*3/uL (ref 0.1–1.0)
Monocytes Relative: 7 %
Neutro Abs: 4.9 10*3/uL (ref 1.7–7.7)
Neutrophils Relative %: 69 %
Platelets: 282 10*3/uL (ref 150–400)
RBC: 4.3 MIL/uL (ref 4.22–5.81)
RDW: 13 % (ref 11.5–15.5)
WBC: 7.1 10*3/uL (ref 4.0–10.5)
nRBC: 0 % (ref 0.0–0.2)

## 2021-01-22 LAB — CBC
HCT: 38.6 % — ABNORMAL LOW (ref 39.0–52.0)
HCT: 37 % — ABNORMAL LOW (ref 39.0–52.0)
Hemoglobin: 12.9 g/dL — ABNORMAL LOW (ref 13.0–17.0)
Hemoglobin: 12.6 g/dL — ABNORMAL LOW (ref 13.0–17.0)
MCH: 32 pg (ref 26.0–34.0)
MCH: 32.3 pg (ref 26.0–34.0)
MCHC: 33.4 g/dL (ref 30.0–36.0)
MCHC: 34.1 g/dL (ref 30.0–36.0)
MCV: 95.8 fL (ref 80.0–100.0)
MCV: 94.9 fL (ref 80.0–100.0)
Platelets: 264 10*3/uL (ref 150–400)
Platelets: 269 10*3/uL (ref 150–400)
RBC: 4.03 MIL/uL — ABNORMAL LOW (ref 4.22–5.81)
RBC: 3.9 MIL/uL — ABNORMAL LOW (ref 4.22–5.81)
RDW: 13.1 % (ref 11.5–15.5)
RDW: 13 % (ref 11.5–15.5)
WBC: 8.6 10*3/uL (ref 4.0–10.5)
WBC: 10.2 10*3/uL (ref 4.0–10.5)
nRBC: 0 % (ref 0.0–0.2)
nRBC: 0 % (ref 0.0–0.2)

## 2021-01-22 LAB — COMPREHENSIVE METABOLIC PANEL
ALT: 12 U/L (ref 0–44)
AST: 20 U/L (ref 15–41)
Albumin: 3.8 g/dL (ref 3.5–5.0)
Alkaline Phosphatase: 50 U/L (ref 38–126)
Anion gap: 9 (ref 5–15)
BUN: 16 mg/dL (ref 8–23)
CO2: 28 mmol/L (ref 22–32)
Calcium: 8.8 mg/dL — ABNORMAL LOW (ref 8.9–10.3)
Chloride: 101 mmol/L (ref 98–111)
Creatinine, Ser: 0.92 mg/dL (ref 0.61–1.24)
GFR, Estimated: >60 mL/min (ref >60)
Glucose, Bld: 126 mg/dL — ABNORMAL HIGH (ref 70–99)
Potassium: 4.4 mmol/L (ref 3.5–5.1)
Sodium: 138 mmol/L (ref 135–145)
Total Bilirubin: 0.8 mg/dL (ref 0.3–1.2)
Total Protein: 6.2 g/dL — ABNORMAL LOW (ref 6.5–8.1)

## 2021-01-22 LAB — IRON AND TIBC
Iron: 103 ug/dL (ref 45–182)
Saturation Ratios: 39 % (ref 17.9–39.5)
TIBC: 262 ug/dL (ref 250–450)
UIBC: 159 ug/dL

## 2021-01-22 LAB — RESP PANEL BY RT-PCR (FLU A&B, COVID) ARPGX2
Influenza A by PCR: NEGATIVE
Influenza B by PCR: NEGATIVE
SARS Coronavirus 2 by RT PCR: NEGATIVE

## 2021-01-22 LAB — OCCULT BLOOD X 1 CARD TO LAB, STOOL: Fecal Occult Bld: POSITIVE — AB

## 2021-01-22 MED ORDER — HALOPERIDOL LACTATE 5 MG/ML IJ SOLN
2.0000 mg | Freq: Once | INTRAMUSCULAR | Status: AC
  Administered 2021-01-22: 2 mg via INTRAVENOUS
  Filled 2021-01-22: qty 1

## 2021-01-22 MED ORDER — ONDANSETRON HCL 4 MG PO TABS: 4.0000 mg | ORAL_TABLET | Freq: Four times a day (QID) | ORAL | Status: DC | PRN

## 2021-01-22 MED ORDER — DONEPEZIL HCL 10 MG PO TABS
10.0000 mg | ORAL_TABLET | Freq: Every day | ORAL | Status: DC
  Administered 2021-01-22 – 2021-01-29 (×8): 10 mg via ORAL
  Filled 2021-01-22 (×8): qty 1

## 2021-01-22 MED ORDER — MELATONIN 3 MG PO TABS
3.0000 mg | ORAL_TABLET | Freq: Every evening | ORAL | Status: DC | PRN
  Administered 2021-01-22 – 2021-01-29 (×4): 3 mg via ORAL
  Filled 2021-01-22 (×4): qty 1

## 2021-01-22 MED ORDER — ONDANSETRON HCL 4 MG/2ML IJ SOLN: 4.0000 mg | Freq: Four times a day (QID) | INTRAMUSCULAR | Status: DC | PRN

## 2021-01-22 MED ORDER — SODIUM CHLORIDE 0.9 % IV SOLN: INTRAVENOUS | Status: DC

## 2021-01-22 MED ORDER — DUTASTERIDE 0.5 MG PO CAPS
0.5000 mg | ORAL_CAPSULE | Freq: Every day | ORAL | Status: DC
  Administered 2021-01-22 – 2021-01-30 (×9): 0.5 mg via ORAL
  Filled 2021-01-22 (×9): qty 1

## 2021-01-22 MED ORDER — ACETAMINOPHEN 650 MG RE SUPP: 650.0000 mg | Freq: Four times a day (QID) | RECTAL | Status: DC | PRN

## 2021-01-22 MED ORDER — ACETAMINOPHEN 325 MG PO TABS
650.0000 mg | ORAL_TABLET | Freq: Four times a day (QID) | ORAL | Status: DC | PRN
  Administered 2021-01-25: 650 mg via ORAL
  Filled 2021-01-22: qty 2

## 2021-01-22 NOTE — ED Notes (Signed)
Report called to Hulan Fess, 5E at [facility].

## 2021-01-22 NOTE — Assessment & Plan Note (Signed)
Admit to med/surg obs bed.  q6h cbc. [facility] has been consulted. Clear liquids tonight. NPO after MN. Discussed with pt and wife. Likely diverticular bleeding. Hold ASA.

## 2021-01-22 NOTE — Assessment & Plan Note (Signed)
On avodart at home.

## 2021-01-22 NOTE — Assessment & Plan Note (Signed)
On aricept at home.

## 2021-01-22 NOTE — ED Triage Notes (Signed)
Pt arrives POV with wife, with c/o rectal bleeding starting today.  Wife noted bright red blood in toilet.

## 2021-01-22 NOTE — H&P (Signed)
History and Physical    Kevin Page [id number] DOB: [birth year] DOA: 01/22/2021  PCP: Ginger Organ., MD   Patient coming from: Home  I have personally briefly reviewed patient's old medical records in [place]  CC: rectal bleeding HPI: Previously healthy 87 year old male with a history of BPH, cognitive impairment presents to the droppage ER today with new onset painless rectal bleeding.  Wife states that at 6:00 this morning, patient was still sleeping.  There is no blood in the bed.  She states that she went into the bed room around around 10 AM since the patient had not gotten up yet which is unusual.  She had found that he had soiled the bed with blood from his rectum.  Patient had gone into the bathroom dripping blood from his rectum on the way to the bathroom.  Patient had a bloody bowel movement.  Wife has a picture on her phone.  She brought him to the ER.  Wife states the patient has taken approximately 4 over-the-counter Aleve's in the last 8 days.  She is only given to him once in the morning.  This is after patient has had food on his stomach.  This is due to right shoulder pain.  Patient is also taking a baby aspirin.  Patient is not on any other oral anticoagulants or Coumadin.  Patient is never had rectal bleeding the past.  In the ER, patient noted to have blood in his rectal vault.  Hemoglobin was more than 13.  He was not hypotensive.  Patient denies any chest pain.  He denies any abdominal pain.  He has had 2 more bloody bowel movements since this morning.  He does not feel lightheaded.  Patient transferred to [facility] for further evaluation.  [facility] has been contacted by EDP.   ED Course: EDP performed rectal exam that showed blood in rectal vault. Pt transferred to [facility] for further management.  Review of Systems:  Review of Systems  Constitutional: Negative.   HENT: Negative.    Eyes: Negative.   Respiratory: Negative.    Cardiovascular: Negative.    Gastrointestinal:  Positive for blood in stool. Negative for abdominal pain, heartburn, nausea and vomiting.  Genitourinary: Negative.   Musculoskeletal: Negative.   Skin: Negative.   Neurological: Negative.   Endo/Heme/Allergies: Negative.   Psychiatric/Behavioral: Negative.    All other systems reviewed and are negative.  Past Medical History:  Diagnosis Date   Allergy    dust and mites   BPH (benign prostatic hyperplasia)    Essential hypertension    Hyperlipidemia    Macular degeneration    Osteoarthritis of both knees    Sinus bradycardia    SVT (supraventricular tachycardia) ([place])    a. Event monitor 2014: few episodes of SVT, longest 12 beats with rate 128.   Syncope     Past Surgical History:  Procedure Laterality Date   CATARACT EXTRACTION W/ INTRAOCULAR LENS  IMPLANT, BILATERAL Bilateral 2011   dupytron contractions  1998 , 2006   both hands   [place]     reports that he has quit smoking. He has never used smokeless tobacco. He reports current alcohol use of about 12.0 standard drinks per week. He reports that he does not use drugs.  No Known Allergies  Family History  Problem Relation Age of Onset   Cancer Brother        tonsils   Heart attack Neg Hx  Prior to Admission medications   Medication Sig Start Date End Date Taking? Authorizing Provider  aspirin EC 81 MG tablet Take 81 mg by mouth daily.   Yes [provider]  donepezil (ARICEPT) 10 MG tablet Take 10 mg by mouth at bedtime. 03/28/17  Yes [provider]  dutasteride (AVODART) 0.5 MG capsule Take 0.5 mg by mouth daily.   Yes [provider]  alendronate (FOSAMAX) 70 MG tablet Take 70 mg by mouth every Thursday. Take with a full glass of water on an empty stomach. Patient not taking: Reported on 01/22/2021    [provider]  diclofenac Sodium (VOLTAREN) 1 % GEL SMARTSIG:2-4 Gram(s) Topical 4 Times Daily PRN Patient not taking: Reported on  01/22/2021 10/18/20   [provider]  MODERNA COVID-19 VACCINE 100 MCG/0.5ML injection  09/17/20   [provider]  Multiple Vitamins-Minerals (MACULAR HEALTH FORMULA PO) Take 2 capsules by mouth daily.    [provider]  MYRBETRIQ 25 MG TB24 tablet Take 25 mg by mouth daily. 12/05/19   [provider]  tamsulosin (FLOMAX) 0.4 MG CAPS capsule Take 0.4 mg by mouth daily. Patient not taking: Reported on 01/22/2021    [provider]    Physical Exam: Vitals:   01/22/21 1142 01/22/21 1200 01/22/21 1231 01/22/21 1446  BP: 135/86 (!) 150/97 (!) 159/78 (!) 163/86  Pulse: 61 (!) 105 61 (!) 58  Resp: '18 20 16 18  '$ Temp:    98.2 F (36.8 C)  TempSrc:    Oral  SpO2: 100% 95% 100% 100%  Weight:      Height:        Physical Exam Vitals and nursing note reviewed.  Constitutional:      General: He is not in acute distress.    Appearance: Normal appearance. He is normal weight. He is not ill-appearing, toxic-appearing or diaphoretic.  HENT:     Head: Normocephalic and atraumatic.     Nose: Nose normal. No rhinorrhea.  Eyes:     General: No scleral icterus. Cardiovascular:     Rate and Rhythm: Normal rate and regular rhythm.  Pulmonary:     Effort: Pulmonary effort is normal.  Abdominal:     General: Abdomen is flat. Bowel sounds are normal. There is no distension.     Tenderness: There is no abdominal tenderness. There is no guarding or rebound.  Musculoskeletal:     Right lower leg: No edema.     Left lower leg: No edema.  Skin:    General: Skin is warm and dry.     Capillary Refill: Capillary refill takes less than 2 seconds.  Neurological:     General: No focal deficit present.     Mental Status: He is alert and oriented to person, place, and time.     Labs on Admission: I have personally reviewed following labs and imaging studies  CBC: Recent Labs  Lab 01/22/21 1125  WBC 7.1  NEUTROABS 4.9  HGB 13.4  HCT 40.5  MCV 94.2  PLT [id number]    Basic Metabolic Panel: Recent Labs  Lab 01/22/21 1125  NA 138  K 4.4  CL 101  CO2 28  GLUCOSE 126*  BUN 16  CREATININE 0.92  CALCIUM 8.8*   GFR: Estimated Creatinine Clearance: 52.6 mL/min (by C-G formula based on SCr of 0.92 mg/dL). Liver Function Tests: Recent Labs  Lab 01/22/21 1125  AST 20  ALT 12  ALKPHOS 50  BILITOT 0.8  PROT 6.2*  ALBUMIN 3.8   No results for input(s): LIPASE, AMYLASE in the last 168 hours. No results for input(s): AMMONIA in the last 168 hours. Coagulation Profile: No results for input(s): INR, PROTIME in the last 168 hours. Cardiac Enzymes: No results for input(s): CKTOTAL, CKMB, CKMBINDEX, TROPONINI in the last 168 hours. BNP (last 3 results) No results for input(s): PROBNP in the last 8760 hours. HbA1C: No results for input(s): HGBA1C in the last 72 hours. CBG: No results for input(s): GLUCAP in the last 168 hours. Lipid Profile: No results for input(s): CHOL, HDL, LDLCALC, TRIG, CHOLHDL, LDLDIRECT in the last 72 hours. Thyroid Function Tests: No results for input(s): TSH, T4TOTAL, FREET4, T3FREE, THYROIDAB in the last 72 hours. Anemia Panel: No results for input(s): VITAMINB12, FOLATE, FERRITIN, TIBC, IRON, RETICCTPCT in the last 72 hours. Urine analysis:    Component Value Date/Time   COLORURINE YELLOW 02/21/2019 1450   APPEARANCEUR HAZY (A) 02/21/2019 1450   LABSPEC 1.019 02/21/2019 1450   PHURINE 5.0 02/21/2019 1450   GLUCOSEU NEGATIVE 02/21/2019 1450   HGBUR NEGATIVE 02/21/2019 1450   BILIRUBINUR NEGATIVE 02/21/2019 1450   KETONESUR 5 (A) 02/21/2019 1450   PROTEINUR NEGATIVE 02/21/2019 1450   UROBILINOGEN 0.2 01/11/2013 2244   NITRITE NEGATIVE 02/21/2019 1450   LEUKOCYTESUR NEGATIVE 02/21/2019 1450    Radiological Exams on Admission: I have personally reviewed images No results found.  EKG: I have personally reviewed EKG: shows NSR   Assessment/Plan Principal Problem:   Painless rectal bleeding Active  Problems:   BPH (benign prostatic hyperplasia)   Cognitive impairment    Painless rectal bleeding Admit to med/surg obs bed.  q6h cbc. [facility] has been consulted. Clear liquids tonight. NPO after MN. Discussed with pt and wife. Likely diverticular bleeding. Hold ASA.  BPH (benign prostatic hyperplasia) On avodart at home.  Cognitive impairment On aricept at home.  DVT prophylaxis: SCDs Code Status: Full Code Family Communication: discussed with pt and wife at bedside  Disposition Plan: return to home  Consults called: [facility] consult by EDP Admission status: Observation, Med-Surg   Kristopher Oppenheim, DO [facility] 01/22/2021, 4:43 PM

## 2021-01-22 NOTE — ED Provider Notes (Addendum)
[facility] EMERGENCY DEPT Provider Note   CSN: [id number] Arrival date & time: 01/22/21  1043     History Chief Complaint  Patient presents with   Rectal Bleeding    Kevin Page is a 87 y.o. male.  Patient with onset rectal bleeding overnight.  No vomiting no abdominal pain.  Had bright red blood in the sheets of the bed.  And then had bright red blood in the commode.  Patient's past medical history sniffing hyperlipidemia hypertension benign prostatic high hyperplasia.  History of SVT.  Not on any blood thinners other than taking a baby aspirin today.  But his wife states he has been taking a lot of Advil for shoulder pain recently.  No prior history of GI bleed.  Does not have a gastroenterologist.  Patient not tachycardic here blood pressure 109/63 heart rate 56.  Patient apparently not on a calcium channel blocker or beta-blocker.      Past Medical History:  Diagnosis Date   Allergy    dust and mites   BPH (benign prostatic hyperplasia)    Essential hypertension    Hyperlipidemia    Macular degeneration    Osteoarthritis of both knees    Sinus bradycardia    SVT (supraventricular tachycardia) ([place])    a. Event monitor 2014: few episodes of SVT, longest 12 beats with rate 128.   Syncope     Patient Active Problem List   Diagnosis Date Noted   SVT (supraventricular tachycardia) ([facility])    Sinus bradycardia    Osteoarthritis of both knees    Macular degeneration    Essential hypertension    Allergy    Advanced dry age-related macular degeneration of both eyes with subfoveal involvement 03/03/2016   Hypertensive retinopathy of both eyes 03/03/2016   Pseudophakia of both eyes 03/03/2016   PVD (posterior vitreous detachment), both eyes 03/03/2016   Orthostasis 01/19/2016   Dilated aortic root ([place]) 01/19/2016   PSVT (paroxysmal supraventricular tachycardia) ([place]) 01/19/2016   Mild aortic insufficiency 01/19/2016   Sinus bradycardia seen on cardiac  monitor 01/18/2016   Syncope 01/12/2013   Hypertension 01/12/2013   Hyperlipidemia 01/12/2013   BPH (benign prostatic hyperplasia) 01/12/2013    Past Surgical History:  Procedure Laterality Date   CATARACT EXTRACTION W/ INTRAOCULAR LENS  IMPLANT, BILATERAL Bilateral 2011   dupytron contractions  1998 , 2006   both hands   LUMBAR FUSION  1994       Family History  Problem Relation Age of Onset   Cancer Brother        tonsils   Heart attack Neg Hx     Social History   Tobacco Use   Smoking status: Former   Smokeless tobacco: Never   Tobacco comments:    Smoked for 44 years, quit 1990s  Vaping Use   Vaping Use: Never used  Substance Use Topics   Alcohol use: Yes    Alcohol/week: 12.0 standard drinks    Types: 12 Standard drinks or equivalent per week    Comment: 1 drink of gin nightly, occasionally more if going out to dinner.   Drug use: No    Home Medications Prior to Admission medications   Medication Sig Start Date End Date Taking? Authorizing Provider  aspirin EC 81 MG tablet Take 81 mg by mouth daily.   Yes [provider]  donepezil (ARICEPT) 10 MG tablet Take 10 mg by mouth at bedtime. 03/28/17  Yes [provider]  dutasteride (AVODART) 0.5 MG capsule Take  0.5 mg by mouth daily.   Yes [provider]  alendronate (FOSAMAX) 70 MG tablet Take 70 mg by mouth every Thursday. Take with a full glass of water on an empty stomach. Patient not taking: Reported on 01/22/2021    [provider]  diclofenac Sodium (VOLTAREN) 1 % GEL SMARTSIG:2-4 Gram(s) Topical 4 Times Daily PRN Patient not taking: Reported on 01/22/2021 10/18/20   [provider]  MODERNA COVID-19 VACCINE 100 MCG/0.5ML injection  09/17/20   [provider]  Multiple Vitamins-Minerals (MACULAR HEALTH FORMULA PO) Take 2 capsules by mouth daily.    [provider]  MYRBETRIQ 25 MG TB24 tablet Take 25 mg by mouth daily. 12/05/19   [provider]  tamsulosin (FLOMAX) 0.4 MG CAPS capsule Take 0.4 mg by mouth daily. Patient not taking: Reported on 01/22/2021    [provider]    Allergies    Patient has no known allergies.  Review of Systems   Review of Systems  Constitutional:  Negative for chills and fever.  HENT:  Negative for ear pain and sore throat.   Eyes:  Negative for pain and visual disturbance.  Respiratory:  Negative for cough and shortness of breath.   Cardiovascular:  Negative for chest pain and palpitations.  Gastrointestinal:  Positive for anal bleeding. Negative for abdominal pain and vomiting.  Genitourinary:  Negative for dysuria and hematuria.  Musculoskeletal:  Negative for arthralgias and back pain.  Skin:  Negative for color change and rash.  Neurological:  Negative for seizures and syncope.  All other systems reviewed and are negative.  Physical Exam Updated Vital Signs BP 109/63 (BP Location: Right Arm)   Pulse (!) 56   Temp 98.5 F (36.9 C) (Oral)   Resp 18   Ht 1.829 m (6')   Wt 65.8 kg   SpO2 97%   BMI 19.67 kg/m   Physical Exam Vitals and nursing note reviewed.  Constitutional:      Appearance: Normal appearance. He is well-developed.  HENT:     Head: Normocephalic and atraumatic.  Eyes:     Extraocular Movements: Extraocular movements intact.     Conjunctiva/sclera: Conjunctivae normal.     Pupils: Pupils are equal, round, and reactive to light.  Cardiovascular:     Rate and Rhythm: Normal rate and regular rhythm.     Heart sounds: No murmur heard. Pulmonary:     Effort: Pulmonary effort is normal. No respiratory distress.     Breath sounds: Normal breath sounds.  Abdominal:     General: There is no distension.     Palpations: Abdomen is soft.     Tenderness: There is no abdominal tenderness. There is no guarding.  Genitourinary:    Rectum: Guaiac result positive.     Comments: External anal area without any prolapsed internal hemorrhoids no evidence of fissure  large amount of maroon-colored blood.  No significant amount of stool in rectal vault.  The blood is just oozing out. Musculoskeletal:        General: No swelling.     Cervical back: Neck supple.  Skin:    General: Skin is warm and dry.  Neurological:     General: No focal deficit present.     Mental Status: He is alert. Mental status is at baseline.    ED Results / Procedures / Treatments   Labs (all labs ordered are listed, but only abnormal results are displayed) Labs Reviewed  CBC WITH DIFFERENTIAL/PLATELET  COMPREHENSIVE METABOLIC PANEL  POC OCCULT BLOOD, ED    EKG EKG Interpretation  Date/Time:  Wednesday January 22 2021 11:01:53 EDT Ventricular Rate:  67 PR Interval:  181 QRS Duration: 87 QT Interval:  432 QTC Calculation: 457 R Axis:   31 Text Interpretation: Sinus rhythm Abnormal R-wave progression, early transition Borderline repolarization abnormality No significant change since last tracing Confirmed by Fredia Sorrow [phone]) on 01/22/2021 11:12:39 AM  Radiology No results found.  Procedures Procedures   Medications Ordered in ED Medications  0.9 %  sodium chloride infusion (has no administration in time range)    ED Course  I have reviewed the triage vital signs and the nursing notes.  Pertinent labs & imaging results that were available during my care of the patient were reviewed by me and considered in my medical decision making (see chart for details).    MDM Rules/Calculators/A&P                         CRITICAL CARE Performed by: Fredia Sorrow Total critical care time: 45 minutes Critical care time was exclusive of separately billable procedures and treating other patients. Critical care was necessary to treat or prevent imminent or life-threatening deterioration. Critical care was time spent personally by me on the following activities: development of treatment plan with patient and/or surrogate as well as nursing, discussions with  consultants, evaluation of patient's response to treatment, examination of patient, obtaining history from patient or surrogate, ordering and performing treatments and interventions, ordering and review of laboratory studies, ordering and review of radiographic studies, pulse oximetry and re-evaluation of patient's condition.  Patient with a fairly significant GI bleed.  We do not have blood products here other than rescue O- blood.  We will work towards getting him admitted sooner than later.  Currently hemodynamically stable.  But patient at risk for becoming unstable due to this large amount of bleeding.  Exam consistent with dark red blood per rectum.  Large amount.  May be due to increased taking of Advil.  Patient not on a blood thinner other than aspirin a day.  No vomiting of any blood.  No complaint of pain abdomen soft nontender.  Patient is initial labs no significant anemia hemoglobin 13.4.  Vital signs of remained stable.  Will contact hospitalist for admission.  We will also make gastroenterology aware so that they can follow.   Final Clinical Impression(s) / ED Diagnoses Final diagnoses:  Gastrointestinal hemorrhage, unspecified gastrointestinal hemorrhage type    Rx / DC Orders ED Discharge Orders     None        Fredia Sorrow, MD 01/22/21 1127    Fredia Sorrow, MD 01/22/21 1234

## 2021-01-22 NOTE — Subjective & Objective (Addendum)
CC: rectal bleeding HPI: Previously healthy 87 year old male with a history of BPH, cognitive impairment presents to the droppage ER today with new onset painless rectal bleeding.  Wife states that at 6:00 this morning, patient was still sleeping.  There is no blood in the bed.  She states that she went into the bed room around around 10 AM since the patient had not gotten up yet which is unusual.  She had found that he had soiled the bed with blood from his rectum.  Patient had gone into the bathroom dripping blood from his rectum on the way to the bathroom.  Patient had a bloody bowel movement.  Wife has a picture on her phone.  She brought him to the ER.  Wife states the patient has taken approximately 4 over-the-counter Aleve's in the last 8 days.  She is only given to him once in the morning.  This is after patient has had food on his stomach.  This is due to right shoulder pain.  Patient is also taking a baby aspirin.  Patient is not on any other oral anticoagulants or Coumadin.  Patient is never had rectal bleeding the past.  In the ER, patient noted to have blood in his rectal vault.  Hemoglobin was more than 13.  He was not hypotensive.  Patient denies any chest pain.  He denies any abdominal pain.  He has had 2 more bloody bowel movements since this morning.  He does not feel lightheaded.  Patient transferred to [facility] for further evaluation.  [facility] has been contacted by EDP.

## 2021-01-22 NOTE — ED Notes (Signed)
Report called to [place] with [facility].

## 2021-01-23 DIAGNOSIS — Z981 Arthrodesis status: Secondary | ICD-10-CM | POA: Diagnosis not present

## 2021-01-23 DIAGNOSIS — K5731 Diverticulosis of large intestine without perforation or abscess with bleeding: Principal | ICD-10-CM

## 2021-01-23 DIAGNOSIS — N400 Enlarged prostate without lower urinary tract symptoms: Secondary | ICD-10-CM | POA: Diagnosis not present

## 2021-01-23 DIAGNOSIS — F0390 Unspecified dementia without behavioral disturbance: Secondary | ICD-10-CM | POA: Diagnosis present

## 2021-01-23 DIAGNOSIS — K625 Hemorrhage of anus and rectum: Secondary | ICD-10-CM

## 2021-01-23 DIAGNOSIS — D62 Acute posthemorrhagic anemia: Secondary | ICD-10-CM | POA: Diagnosis not present

## 2021-01-23 DIAGNOSIS — H3530 Unspecified macular degeneration: Secondary | ICD-10-CM | POA: Diagnosis not present

## 2021-01-23 DIAGNOSIS — K649 Unspecified hemorrhoids: Secondary | ICD-10-CM | POA: Diagnosis present

## 2021-01-23 DIAGNOSIS — I10 Essential (primary) hypertension: Secondary | ICD-10-CM | POA: Diagnosis not present

## 2021-01-23 DIAGNOSIS — I959 Hypotension, unspecified: Secondary | ICD-10-CM | POA: Diagnosis not present

## 2021-01-23 DIAGNOSIS — Z20822 Contact with and (suspected) exposure to covid-19: Secondary | ICD-10-CM | POA: Diagnosis not present

## 2021-01-23 DIAGNOSIS — Z781 Physical restraint status: Secondary | ICD-10-CM | POA: Diagnosis not present

## 2021-01-23 DIAGNOSIS — E785 Hyperlipidemia, unspecified: Secondary | ICD-10-CM | POA: Diagnosis not present

## 2021-01-23 DIAGNOSIS — R4189 Other symptoms and signs involving cognitive functions and awareness: Secondary | ICD-10-CM | POA: Diagnosis not present

## 2021-01-23 DIAGNOSIS — Z7983 Long term (current) use of bisphosphonates: Secondary | ICD-10-CM | POA: Diagnosis not present

## 2021-01-23 DIAGNOSIS — Z79899 Other long term (current) drug therapy: Secondary | ICD-10-CM | POA: Diagnosis not present

## 2021-01-23 DIAGNOSIS — Z808 Family history of malignant neoplasm of other organs or systems: Secondary | ICD-10-CM | POA: Diagnosis not present

## 2021-01-23 DIAGNOSIS — R69 Illness, unspecified: Secondary | ICD-10-CM | POA: Diagnosis not present

## 2021-01-23 DIAGNOSIS — K921 Melena: Secondary | ICD-10-CM | POA: Diagnosis not present

## 2021-01-23 DIAGNOSIS — R627 Adult failure to thrive: Secondary | ICD-10-CM | POA: Diagnosis present

## 2021-01-23 DIAGNOSIS — R059 Cough, unspecified: Secondary | ICD-10-CM | POA: Diagnosis not present

## 2021-01-23 DIAGNOSIS — Z7982 Long term (current) use of aspirin: Secondary | ICD-10-CM | POA: Diagnosis not present

## 2021-01-23 DIAGNOSIS — Z87891 Personal history of nicotine dependence: Secondary | ICD-10-CM | POA: Diagnosis not present

## 2021-01-23 LAB — COMPREHENSIVE METABOLIC PANEL
ALT: 15 U/L (ref 0–44)
AST: 34 U/L (ref 15–41)
Albumin: 3.6 g/dL (ref 3.5–5.0)
Alkaline Phosphatase: 50 U/L (ref 38–126)
Anion gap: 8 (ref 5–15)
BUN: 17 mg/dL (ref 8–23)
CO2: 25 mmol/L (ref 22–32)
Calcium: 8.5 mg/dL — ABNORMAL LOW (ref 8.9–10.3)
Chloride: 103 mmol/L (ref 98–111)
Creatinine, Ser: 0.79 mg/dL (ref 0.61–1.24)
GFR, Estimated: >60 mL/min (ref >60)
Glucose, Bld: 104 mg/dL — ABNORMAL HIGH (ref 70–99)
Potassium: 3.7 mmol/L (ref 3.5–5.1)
Sodium: 136 mmol/L (ref 135–145)
Total Bilirubin: 1 mg/dL (ref 0.3–1.2)
Total Protein: 5.7 g/dL — ABNORMAL LOW (ref 6.5–8.1)

## 2021-01-23 LAB — CBC
HCT: 35.8 % — ABNORMAL LOW (ref 39.0–52.0)
HCT: 35.2 % — ABNORMAL LOW (ref 39.0–52.0)
Hemoglobin: 11.8 g/dL — ABNORMAL LOW (ref 13.0–17.0)
Hemoglobin: 11.8 g/dL — ABNORMAL LOW (ref 13.0–17.0)
MCH: 31.3 pg (ref 26.0–34.0)
MCH: 31.8 pg (ref 26.0–34.0)
MCHC: 33 g/dL (ref 30.0–36.0)
MCHC: 33.5 g/dL (ref 30.0–36.0)
MCV: 95 fL (ref 80.0–100.0)
MCV: 94.9 fL (ref 80.0–100.0)
Platelets: 250 10*3/uL (ref 150–400)
Platelets: 260 10*3/uL (ref 150–400)
RBC: 3.77 MIL/uL — ABNORMAL LOW (ref 4.22–5.81)
RBC: 3.71 MIL/uL — ABNORMAL LOW (ref 4.22–5.81)
RDW: 13 % (ref 11.5–15.5)
RDW: 13.2 % (ref 11.5–15.5)
WBC: 12.9 10*3/uL — ABNORMAL HIGH (ref 4.0–10.5)
WBC: 7.2 10*3/uL (ref 4.0–10.5)
nRBC: 0 % (ref 0.0–0.2)
nRBC: 0 % (ref 0.0–0.2)

## 2021-01-23 MED ORDER — LIDOCAINE 5 % EX PTCH
1.0000 | MEDICATED_PATCH | CUTANEOUS | Status: DC
  Administered 2021-01-23: 1 via TRANSDERMAL
  Filled 2021-01-23 (×8): qty 1

## 2021-01-23 MED ORDER — HALOPERIDOL LACTATE 5 MG/ML IJ SOLN
2.0000 mg | Freq: Once | INTRAMUSCULAR | Status: AC
  Administered 2021-01-23: 2 mg via INTRAVENOUS
  Filled 2021-01-23: qty 1

## 2021-01-23 MED ORDER — PANTOPRAZOLE SODIUM 40 MG IV SOLR
40.0000 mg | Freq: Two times a day (BID) | INTRAVENOUS | Status: DC
  Administered 2021-01-23 – 2021-01-27 (×9): 40 mg via INTRAVENOUS
  Filled 2021-01-23 (×9): qty 40

## 2021-01-23 NOTE — Progress Notes (Signed)
Progress Note    Kevin Page  [id number] DOB: 14-Jun-1930  DOA: 01/22/2021 PCP: Ginger Organ., MD    Brief Narrative:     Medical records reviewed and are as summarized below:  Kevin Page is an 87 y.o. male with a history of BPH, cognitive impairment presents to the [facility] ER today with new onset painless rectal bleeding.  Suspect lower GI bleed, conservative management planned.    Assessment/Plan:   Principal Problem:   Painless rectal bleeding Active Problems:   BPH (benign prostatic hyperplasia)   Cognitive impairment   Rectal bleeding   Painless rectal bleeding -likely diverticular -GI consulted -h/h stable   BPH (benign prostatic hyperplasia) -avodart -   Cognitive impairment On aricept at home. -delirium precautions   Family Communication/Anticipated D/C date and plan/Code Status   DVT prophylaxis: scd Code Status: Full Code.  Family Communication: wife at bedside Disposition Plan: Status is: inpt  The patient will require care spanning > 2 midnights and should be moved to inpatient because: Inpatient level of care appropriate due to severity of illness  Dispo: The patient is from: Home              Anticipated d/c is to: Home              Patient currently is not medically stable to d/c.   Difficult to place patient No         Medical Consultants:   GI    Subjective:   No SOB, no CP-- was hoping to go home  Objective:    Vitals:   01/22/21 1838 01/22/21 1845 01/22/21 2319 01/23/21 0235  BP: (!) 172/81 (!) 152/80 (!) 151/87 (!) 128/92  Pulse: 68 65 87 72  Resp: (!) 21  (!) 22 (!) 24  Temp: 98.7 F (37.1 C)  98.4 F (36.9 C) 98.1 F (36.7 C)  TempSrc: Oral  Oral Oral  SpO2: 99%  94% 92%  Weight:      Height:        Intake/Output Summary (Last 24 hours) at 01/23/2021 1133 Last data filed at 01/22/2021 1500 Gross per 24 hour  Intake 0 ml  Output --  Net 0 ml   Filed Weights   01/22/21 1102  Weight: 65.8 kg     Exam:  General: Appearance:    Thin male in no acute distress     Lungs:     respirations unlabored  Heart:    Normal heart rate.    MS:   All extremities are intact. Hands in mitts this AM   Neurologic:   Awake, alert, pleasant     Data Reviewed:   I have personally reviewed following labs and imaging studies:  Labs: Labs show the following:   Basic Metabolic Panel: Recent Labs  Lab 01/22/21 1125 01/23/21 0402  NA 138 136  K 4.4 3.7  CL 101 103  CO2 28 25  GLUCOSE 126* 104*  BUN 16 17  CREATININE 0.92 0.79  CALCIUM 8.8* 8.5*   GFR Estimated Creatinine Clearance: 60.5 mL/min (by C-G formula based on SCr of 0.79 mg/dL). Liver Function Tests: Recent Labs  Lab 01/22/21 1125 01/23/21 0402  AST 20 34  ALT 12 15  ALKPHOS 50 50  BILITOT 0.8 1.0  PROT 6.2* 5.7*  ALBUMIN 3.8 3.6   No results for input(s): LIPASE, AMYLASE in the last 168 hours. No results for input(s): AMMONIA in the last 168 hours. Coagulation profile No results for  input(s): INR, PROTIME in the last 168 hours.  CBC: Recent Labs  Lab 01/22/21 1125 01/22/21 1627 01/22/21 2232 01/23/21 0402  WBC 7.1 8.6 10.2 12.9*  NEUTROABS 4.9  --   --   --   HGB 13.4 12.9* 12.6* 11.8*  HCT 40.5 38.6* 37.0* 35.8*  MCV 94.2 95.8 94.9 95.0  PLT 282 264 269 250   Cardiac Enzymes: No results for input(s): CKTOTAL, CKMB, CKMBINDEX, TROPONINI in the last 168 hours. BNP (last 3 results) No results for input(s): PROBNP in the last 8760 hours. CBG: No results for input(s): GLUCAP in the last 168 hours. D-Dimer: No results for input(s): DDIMER in the last 72 hours. Hgb A1c: No results for input(s): HGBA1C in the last 72 hours. Lipid Profile: No results for input(s): CHOL, HDL, LDLCALC, TRIG, CHOLHDL, LDLDIRECT in the last 72 hours. Thyroid function studies: No results for input(s): TSH, T4TOTAL, T3FREE, THYROIDAB in the last 72 hours.  Invalid input(s): FREET3 Anemia work up: Recent Labs     01/22/21 1316  TIBC 262  IRON 103   Sepsis Labs: Recent Labs  Lab 01/22/21 1125 01/22/21 1627 01/22/21 2232 01/23/21 0402  WBC 7.1 8.6 10.2 12.9*    Microbiology Recent Results (from the past 240 hour(s))  Resp Panel by RT-PCR (Flu A&B, Covid) Nasopharyngeal Swab     Status: None   Collection Time: 01/22/21 11:35 AM   Specimen: Nasopharyngeal Swab; Nasopharyngeal(NP) swabs in vial transport medium  Result Value Ref Range Status   SARS Coronavirus 2 by RT PCR NEGATIVE NEGATIVE Final    Comment: (NOTE) SARS-CoV-2 target nucleic acids are NOT DETECTED.  The SARS-CoV-2 RNA is generally detectable in upper respiratory specimens during the acute phase of infection. The lowest concentration of SARS-CoV-2 viral copies this assay can detect is 138 copies/mL. A negative result does not preclude SARS-Cov-2 infection and should not be used as the sole basis for treatment or other patient management decisions. A negative result may occur with  improper specimen collection/handling, submission of specimen other than nasopharyngeal swab, presence of viral mutation(s) within the areas targeted by this assay, and inadequate number of viral copies(<138 copies/mL). A negative result must be combined with clinical observations, patient history, and epidemiological information. The expected result is Negative.  Fact Sheet for Patients:  EntrepreneurPulse.com.au  Fact Sheet for Healthcare Providers:  IncredibleEmployment.be  This test is no t yet approved or cleared by the [place] FDA and  has been authorized for detection and/or diagnosis of SARS-CoV-2 by FDA under an Emergency Use Authorization (EUA). This EUA will remain  in effect (meaning this test can be used) for the duration of the COVID-19 declaration under Section 564(b)(1) of the Act, 21 U.S.C.section 360bbb-3(b)(1), unless the authorization is terminated  or revoked sooner.        Influenza A by PCR NEGATIVE NEGATIVE Final   Influenza B by PCR NEGATIVE NEGATIVE Final    Comment: (NOTE) The Xpert Xpress SARS-CoV-2/FLU/RSV plus assay is intended as an aid in the diagnosis of influenza from Nasopharyngeal swab specimens and should not be used as a sole basis for treatment. Nasal washings and aspirates are unacceptable for Xpert Xpress SARS-CoV-2/FLU/RSV testing.  Fact Sheet for Patients: EntrepreneurPulse.com.au  Fact Sheet for Healthcare Providers: IncredibleEmployment.be  This test is not yet approved or cleared by the [place] FDA and has been authorized for detection and/or diagnosis of SARS-CoV-2 by FDA under an Emergency Use Authorization (EUA). This EUA will remain in effect (meaning this test can  be used) for the duration of the COVID-19 declaration under Section 564(b)(1) of the Act, 21 U.S.C. section 360bbb-3(b)(1), unless the authorization is terminated or revoked.  Performed at [facility], [address], [place], [place] [address]     Procedures and diagnostic studies:  No results found.  Medications:    donepezil  10 mg Oral QHS   dutasteride  0.5 mg Oral Daily   pantoprazole (PROTONIX) IV  40 mg Intravenous Q12H   Continuous Infusions:  sodium chloride 75 mL/hr at 01/22/21 1132     LOS: 0 days   Geradine Girt  [facility]   How to contact the [facility] Attending or Consulting provider [place] or covering provider during after hours [place], for this patient?  Check the care team in [facility] and look for a) attending/consulting [facility] provider listed and b) the [facility] team listed Log into [email or web address] and use [place]'s universal password to access. If you do not have the password, please contact the hospital operator. Locate the [facility] provider you are looking for under [facility] and page to a number that you can be directly reached. If you still have difficulty  reaching the provider, please page the [facility] (Director on Call) for the Hospitalists listed on amion for assistance.  01/23/2021, 11:33 AM

## 2021-01-23 NOTE — Consult Note (Addendum)
Referring Provider:  [facility]         Primary Care Physician:  Kevin Organ., MD Primary Gastroenterologist:   Silvano Rusk, MD  Reason for Consultation:    GI bleed               Attending physician's note   I have taken a history, examined the patient and reviewed the chart. I agree with the Advanced Practitioner's note, impression and recommendations.  87 year old male admitted with hematochezia, likely etiology diverticular hemorrhage. Hemoglobin trended down to 11 from baseline 13  Will continue to monitor and manage conservatively unless he starts having persistent hematochezia If he develops brisk bleeding, will need nuclear med bleeding scan and CTA Will hold off colonoscopy   The patient was provided an opportunity to ask questions and all were answered. The patient agreed with the plan and demonstrated an understanding of the instructions.  Kevin Page , MD [phone]     ASSESSMENT / PLAN   # 87 yo male admitted with painless gastrointestinal bleeding. Suspect lower GI bleed, possibly diverticular hemorrhage with known history of left sided diverticulosis ( colonoscopy 2013). Brisk upper GI bleed not excluded in setting of NSAIDs but BUN normal and he has been hemodynamically stable.  --In case this is an upper bleed will start BID IV PPI --He had clears for breakfast. Will make NPO until decision made about whether EGD is needed but again this seems more likely to be a lower GI bleed.    # Acute blood loss anemia secondary to above. Hgb 13.4 >> 11.8  --Monitor closely   HISTORY OF PRESENT ILLNESS                                                                                                                         Chief Complaint: gastrointestinal bleeding  Kevin Page is a 87 y.o. male with a past medical history significant for SVT, HTN, hyperlipidemia, OA of knees, cognitive impairment, BPH.  See [facility] for any additional medical history.    ED course:  Patient presented to ED yesterday with rectal bleeding. In ED he was hemodynamically stable. Hgb 13.4 ( at baseline).  FOBT+. Normal BUN. On rectal exam he had hemorrhoids and a large amount of maroon colored stool.   Patient's wife [name] is in the room and helps with history. Patient has some mild confusion. Per patient's wife patient began having having rectal bleeding yesterday. The blood was bright red. He had no associated abdominal pain. No nausea / vomiting. No significant weakness and he was no hypotensive in ED. Patient has been having shoulder pain for which wife has been giving him Aleve about 4 times a week over the last few months. Additionally he takes a daily baby asa. Prior to yesterday patient's bowel movements were normal per wife.   Overnight patient's hgb declined from 13.4 >> 12.6 >> 11.8 this am. BUN still normal. TIBC normal at 262, iron sat normal  at 39%    SIGNIFICANT DIAGNOTIC STUDIES   none   PREVIOUS ENDOSCOPIC EVALUATIONS   July 2013 screening colonoscopy  Complete exam , excellent bowel prep.  Severe left sided diverticulosis, otherwise normal.    Past Medical History:  Diagnosis Date   Allergy    dust and mites   BPH (benign prostatic hyperplasia)    Essential hypertension    Hyperlipidemia    Macular degeneration    Osteoarthritis of both knees    Sinus bradycardia    SVT (supraventricular tachycardia) ([place])    a. Event monitor 2014: few episodes of SVT, longest 12 beats with rate 128.    Past Surgical History:  Procedure Laterality Date   CATARACT EXTRACTION W/ INTRAOCULAR LENS  IMPLANT, BILATERAL Bilateral 2011   dupytron contractions  1998 , 2006   both hands   [place]    Prior to Admission medications   Medication Sig Start Date End Date Taking? Authorizing Provider  aspirin EC 81 MG tablet Take 81 mg by mouth daily.   Yes [provider]  donepezil (ARICEPT) 10 MG tablet Take 10 mg by mouth at  bedtime. 03/28/17  Yes [provider]  dutasteride (AVODART) 0.5 MG capsule Take 0.5 mg by mouth daily.   Yes [provider]  alendronate (FOSAMAX) 70 MG tablet Take 70 mg by mouth every Thursday. Take with a full glass of water on an empty stomach. Patient not taking: Reported on 01/22/2021    [provider]  diclofenac Sodium (VOLTAREN) 1 % GEL SMARTSIG:2-4 Gram(s) Topical 4 Times Daily PRN Patient not taking: Reported on 01/22/2021 10/18/20   [provider]  MODERNA COVID-19 VACCINE 100 MCG/0.5ML injection  09/17/20   [provider]  Multiple Vitamins-Minerals (MACULAR HEALTH FORMULA PO) Take 2 capsules by mouth daily.    [provider]  MYRBETRIQ 25 MG TB24 tablet Take 25 mg by mouth daily. 12/05/19   [provider]  tamsulosin (FLOMAX) 0.4 MG CAPS capsule Take 0.4 mg by mouth daily. Patient not taking: Reported on 01/22/2021    [provider]    Current Facility-Administered Medications  Medication Dose Route Frequency Provider Last Rate Last Admin   0.9 %  sodium chloride infusion   Intravenous Continuous Kristopher Oppenheim, DO 75 mL/hr at 01/22/21 1132 New Bag at 01/22/21 1132   acetaminophen (TYLENOL) tablet 650 mg  650 mg Oral Q6H PRN Kristopher Oppenheim, DO       Or   acetaminophen (TYLENOL) suppository 650 mg  650 mg Rectal Q6H PRN Kristopher Oppenheim, DO       donepezil (ARICEPT) tablet 10 mg  10 mg Oral QHS Kristopher Oppenheim, DO   10 mg at 01/22/21 2139   dutasteride (AVODART) capsule 0.5 mg  0.5 mg Oral Daily Kristopher Oppenheim, DO   0.5 mg at 01/22/21 2138   melatonin tablet 3 mg  3 mg Oral QHS PRN Kristopher Oppenheim, DO   3 mg at 01/22/21 2139   ondansetron (ZOFRAN) tablet 4 mg  4 mg Oral Q6H PRN Kristopher Oppenheim, DO       Or   ondansetron [facility]) injection 4 mg  4 mg Intravenous Q6H PRN Kristopher Oppenheim, DO        Allergies as of 01/22/2021   (No Known Allergies)    Family History  Problem Relation Age of Onset   Cancer Brother        tonsils   Heart  attack Neg Hx  Social History   Socioeconomic History   Marital status: Married    Spouse name: Not on file   Number of children: Not on file   Years of education: Not on file   Highest education level: Not on file  Occupational History   Not on file  Tobacco Use   Smoking status: Former   Smokeless tobacco: Never   Tobacco comments:    Smoked for 44 years, quit 1990s  Vaping Use   Vaping Use: Never used  Substance and Sexual Activity   Alcohol use: Yes    Alcohol/week: 12.0 standard drinks    Types: 12 Standard drinks or equivalent per week    Comment: 1 drink of gin nightly, occasionally more if going out to dinner.   Drug use: No   Sexual activity: Not on file  Other Topics Concern   Not on file  Social History Narrative   Not on file   Social Determinants of Health   Financial Resource Strain: Not on file  Food Insecurity: Not on file  Transportation Needs: Not on file  Physical Activity: Not on file  Stress: Not on file  Social Connections: Not on file  Intimate Partner Violence: Not on file    Review of Systems: No obtained. Patient has mild memory disturbances.  OBJECTIVE    Physical Exam: Vital signs in last 24 hours: Temp:  [98.1 F (36.7 C)-98.7 F (37.1 C)] 98.1 F (36.7 C) (09/08 0235) Pulse Rate:  [56-105] 72 (09/08 0235) Resp:  [16-24] 24 (09/08 0235) BP: (109-172)/(63-97) 128/92 (09/08 0235) SpO2:  [92 %-100 %] 92 % (09/08 0235) Weight:  [65.8 kg] 65.8 kg (09/07 1102) Last BM Date: 01/22/21 General:   Alert  thin male in NAD Psych:  Pleasant, cooperative. Normal mood and affect. Eyes:  Pupils equal, sclera clear, no icterus.   Conjunctiva pink. Ears:  Normal auditory acuity. Nose:  No deformity, discharge,  or lesions. Neck:  Supple; no masses Lungs:  Clear throughout to auscultation.   No wheezes, crackles, or rhonchi.  Heart:  Regular rate and rhythm;  no lower extremity edema Abdomen:  Soft, non-distended, nontender, BS active,  no palp mass   Rectal:  Deferred. Dark red blood smeared on buttocks and bed linens  Msk:  Symmetrical without gross deformities. . Neurologic:  Alert and  oriented x4;  grossly normal neurologically. Skin:  Intact without significant lesions or rashes.   Scheduled inpatient medications  donepezil  10 mg Oral QHS   dutasteride  0.5 mg Oral Daily      Intake/Output from previous day: No intake/output data recorded. Intake/Output this shift: No intake/output data recorded.   Lab Results: Recent Labs    01/22/21 1627 01/22/21 2232 01/23/21 0402  WBC 8.6 10.2 12.9*  HGB 12.9* 12.6* 11.8*  HCT 38.6* 37.0* 35.8*  PLT 264 269 250   BMET Recent Labs    01/22/21 1125 01/23/21 0402  NA 138 136  K 4.4 3.7  CL 101 103  CO2 28 25  GLUCOSE 126* 104*  BUN 16 17  CREATININE 0.92 0.79  CALCIUM 8.8* 8.5*   LFT Recent Labs    01/23/21 0402  PROT 5.7*  ALBUMIN 3.6  AST 34  ALT 15  ALKPHOS 50  BILITOT 1.0   PT/INR No results for input(s): LABPROT, INR in the last 72 hours. Hepatitis Panel No results for input(s): HEPBSAG, HCVAB, HEPAIGM, HEPBIGM in the last 72 hours.   . CBC Latest Ref Rng & Units 01/23/2021  01/22/2021 01/22/2021  WBC 4.0 - 10.5 K/uL 12.9(H) 10.2 8.6  Hemoglobin 13.0 - 17.0 g/dL 11.8(L) 12.6(L) 12.9(L)  Hematocrit 39.0 - 52.0 % 35.8(L) 37.0(L) 38.6(L)  Platelets 150 - 400 K/uL 250 269 264    . CMP Latest Ref Rng & Units 01/23/2021 01/22/2021 12/08/2019  Glucose 70 - 99 mg/dL 104(H) 126(H) 103(H)  BUN 8 - 23 mg/dL '17 16 18  '$ Creatinine 0.61 - 1.24 mg/dL 0.79 0.92 0.92  Sodium 135 - 145 mmol/L 136 138 142  Potassium 3.5 - 5.1 mmol/L 3.7 4.4 4.1  Chloride 98 - 111 mmol/L 103 101 100  CO2 22 - 32 mmol/L '25 28 27  '$ Calcium 8.9 - 10.3 mg/dL 8.5(L) 8.8(L) 9.6  Total Protein 6.5 - 8.1 g/dL 5.7(L) 6.2(L) -  Total Bilirubin 0.3 - 1.2 mg/dL 1.0 0.8 -  Alkaline Phos 38 - 126 U/L 50 50 -  AST 15 - 41 U/L 34 20 -  ALT 0 - 44 U/L 15 12 -   Studies/Results: No  results found.  Principal Problem:   Painless rectal bleeding Active Problems:   BPH (benign prostatic hyperplasia)   Cognitive impairment    Tye Savoy, NP-C @  01/23/2021, 9:07 AM

## 2021-01-24 DIAGNOSIS — K5731 Diverticulosis of large intestine without perforation or abscess with bleeding: Secondary | ICD-10-CM

## 2021-01-24 DIAGNOSIS — K625 Hemorrhage of anus and rectum: Secondary | ICD-10-CM | POA: Diagnosis not present

## 2021-01-24 LAB — CBC
HCT: 34.3 % — ABNORMAL LOW (ref 39.0–52.0)
HCT: 34.4 % — ABNORMAL LOW (ref 39.0–52.0)
Hemoglobin: 11.6 g/dL — ABNORMAL LOW (ref 13.0–17.0)
Hemoglobin: 11.5 g/dL — ABNORMAL LOW (ref 13.0–17.0)
MCH: 32.1 pg (ref 26.0–34.0)
MCH: 31.9 pg (ref 26.0–34.0)
MCHC: 33.8 g/dL (ref 30.0–36.0)
MCHC: 33.4 g/dL (ref 30.0–36.0)
MCV: 95 fL (ref 80.0–100.0)
MCV: 95.6 fL (ref 80.0–100.0)
Platelets: 247 10*3/uL (ref 150–400)
Platelets: 266 10*3/uL (ref 150–400)
RBC: 3.61 MIL/uL — ABNORMAL LOW (ref 4.22–5.81)
RBC: 3.6 MIL/uL — ABNORMAL LOW (ref 4.22–5.81)
RDW: 13.2 % (ref 11.5–15.5)
RDW: 13.2 % (ref 11.5–15.5)
WBC: 8.1 10*3/uL (ref 4.0–10.5)
WBC: 8 10*3/uL (ref 4.0–10.5)
nRBC: 0 % (ref 0.0–0.2)
nRBC: 0 % (ref 0.0–0.2)

## 2021-01-24 NOTE — Progress Notes (Signed)
Progress Note    Kevin Page  [id number] DOB: 01-02-1931  DOA: 01/22/2021 PCP: Ginger Organ., MD    Brief Narrative:     Medical records reviewed and are as summarized below:  Kevin Page is an 87 y.o. male with a history of BPH, cognitive impairment presents to the droppage ER today with new onset painless rectal bleeding.  Suspect lower GI bleed, conservative management planned.  Hgb stable.  From home with wife and has recently had home health.    Assessment/Plan:   Principal Problem:   Painless rectal bleeding Active Problems:   BPH (benign prostatic hyperplasia)   Cognitive impairment   Rectal bleeding   Painless rectal bleeding -likely diverticular -GI consulted- conservative management -2 small maroon BMs today per nursing -h/h stable   BPH (benign prostatic hyperplasia) -avodart    Cognitive impairment On aricept at home. -delirium precautions   Family Communication/Anticipated D/C date and plan/Code Status   DVT prophylaxis: scd Code Status: Full Code.  Family Communication: wife at bedside Disposition Plan: Status is: inpt  The patient will require care spanning > 2 midnights and should be moved to inpatient because: Inpatient level of care appropriate due to severity of illness  Dispo: The patient is from: Home              Anticipated d/c is to: Home              Patient currently is not medically stable to d/c.   Difficult to place patient No         Medical Consultants:   GI    Subjective:   Had 2 small BMs maroon in color   Objective:    Vitals:   01/23/21 1157 01/23/21 1953 01/24/21 0448 01/24/21 0847  BP: (!) 163/79 (!) 155/78 (!) 154/77 (!) 148/78  Pulse: (!) 59 70 61 72  Resp: (!) '21 20 20 '$ (!) 24  Temp: 98 F (36.7 C) 97.7 F (36.5 C) 97.9 F (36.6 C) 98.1 F (36.7 C)  TempSrc: Oral Oral Oral Oral  SpO2: 99% 95% 95% 94%  Weight:      Height:        Intake/Output Summary (Last 24 hours) at  01/24/2021 1003 Last data filed at 01/24/2021 0700 Gross per 24 hour  Intake 1083.95 ml  Output 1450 ml  Net -366.05 ml   Filed Weights   01/22/21 1102  Weight: 65.8 kg    Exam:   General: Appearance:    Thin male in no acute distress     Lungs:     respirations unlabored  Heart:    Normal heart rate.   MS:   All extremities are intact.    Neurologic:   Awake, alert, pleasant       Data Reviewed:   I have personally reviewed following labs and imaging studies:  Labs: Labs show the following:   Basic Metabolic Panel: Recent Labs  Lab 01/22/21 1125 01/23/21 0402  NA 138 136  K 4.4 3.7  CL 101 103  CO2 28 25  GLUCOSE 126* 104*  BUN 16 17  CREATININE 0.92 0.79  CALCIUM 8.8* 8.5*   GFR Estimated Creatinine Clearance: 60.5 mL/min (by C-G formula based on SCr of 0.79 mg/dL). Liver Function Tests: Recent Labs  Lab 01/22/21 1125 01/23/21 0402  AST 20 34  ALT 12 15  ALKPHOS 50 50  BILITOT 0.8 1.0  PROT 6.2* 5.7*  ALBUMIN 3.8 3.6   No results  for input(s): LIPASE, AMYLASE in the last 168 hours. No results for input(s): AMMONIA in the last 168 hours. Coagulation profile No results for input(s): INR, PROTIME in the last 168 hours.  CBC: Recent Labs  Lab 01/22/21 1125 01/22/21 1627 01/22/21 2232 01/23/21 0402 01/23/21 1610 01/24/21 0451  WBC 7.1 8.6 10.2 12.9* 7.2 8.1  NEUTROABS 4.9  --   --   --   --   --   HGB 13.4 12.9* 12.6* 11.8* 11.8* 11.6*  HCT 40.5 38.6* 37.0* 35.8* 35.2* 34.3*  MCV 94.2 95.8 94.9 95.0 94.9 95.0  PLT 282 264 269 250 260 247   Cardiac Enzymes: No results for input(s): CKTOTAL, CKMB, CKMBINDEX, TROPONINI in the last 168 hours. BNP (last 3 results) No results for input(s): PROBNP in the last 8760 hours. CBG: No results for input(s): GLUCAP in the last 168 hours. D-Dimer: No results for input(s): DDIMER in the last 72 hours. Hgb A1c: No results for input(s): HGBA1C in the last 72 hours. Lipid Profile: No results for  input(s): CHOL, HDL, LDLCALC, TRIG, CHOLHDL, LDLDIRECT in the last 72 hours. Thyroid function studies: No results for input(s): TSH, T4TOTAL, T3FREE, THYROIDAB in the last 72 hours.  Invalid input(s): FREET3 Anemia work up: Recent Labs    01/22/21 1316  TIBC 262  IRON 103   Sepsis Labs: Recent Labs  Lab 01/22/21 2232 01/23/21 0402 01/23/21 1610 01/24/21 0451  WBC 10.2 12.9* 7.2 8.1    Microbiology Recent Results (from the past 240 hour(s))  Resp Panel by RT-PCR (Flu A&B, Covid) Nasopharyngeal Swab     Status: None   Collection Time: 01/22/21 11:35 AM   Specimen: Nasopharyngeal Swab; Nasopharyngeal(NP) swabs in vial transport medium  Result Value Ref Range Status   SARS Coronavirus 2 by RT PCR NEGATIVE NEGATIVE Final    Comment: (NOTE) SARS-CoV-2 target nucleic acids are NOT DETECTED.  The SARS-CoV-2 RNA is generally detectable in upper respiratory specimens during the acute phase of infection. The lowest concentration of SARS-CoV-2 viral copies this assay can detect is 138 copies/mL. A negative result does not preclude SARS-Cov-2 infection and should not be used as the sole basis for treatment or other patient management decisions. A negative result may occur with  improper specimen collection/handling, submission of specimen other than nasopharyngeal swab, presence of viral mutation(s) within the areas targeted by this assay, and inadequate number of viral copies(<138 copies/mL). A negative result must be combined with clinical observations, patient history, and epidemiological information. The expected result is Negative.  Fact Sheet for Patients:  EntrepreneurPulse.com.au  Fact Sheet for Healthcare Providers:  IncredibleEmployment.be  This test is no t yet approved or cleared by the [place] FDA and  has been authorized for detection and/or diagnosis of SARS-CoV-2 by FDA under an Emergency Use Authorization (EUA). This  EUA will remain  in effect (meaning this test can be used) for the duration of the COVID-19 declaration under Section 564(b)(1) of the Act, 21 U.S.C.section 360bbb-3(b)(1), unless the authorization is terminated  or revoked sooner.       Influenza A by PCR NEGATIVE NEGATIVE Final   Influenza B by PCR NEGATIVE NEGATIVE Final    Comment: (NOTE) The Xpert Xpress SARS-CoV-2/FLU/RSV plus assay is intended as an aid in the diagnosis of influenza from Nasopharyngeal swab specimens and should not be used as a sole basis for treatment. Nasal washings and aspirates are unacceptable for Xpert Xpress SARS-CoV-2/FLU/RSV testing.  Fact Sheet for Patients: EntrepreneurPulse.com.au  Fact Sheet for Healthcare  Providers: IncredibleEmployment.be  This test is not yet approved or cleared by the [place] and has been authorized for detection and/or diagnosis of SARS-CoV-2 by FDA under an Emergency Use Authorization (EUA). This EUA will remain in effect (meaning this test can be used) for the duration of the COVID-19 declaration under Section 564(b)(1) of the Act, 21 U.S.C. section 360bbb-3(b)(1), unless the authorization is terminated or revoked.  Performed at [facility], [address], [place], [place] [address]     Procedures and diagnostic studies:  No results found.  Medications:    donepezil  10 mg Oral QHS   dutasteride  0.5 mg Oral Daily   lidocaine  1 patch Transdermal Q24H   pantoprazole (PROTONIX) IV  40 mg Intravenous Q12H   Continuous Infusions:     LOS: 1 day   Geradine Girt  [facility]   How to contact the [facility] Attending or Consulting provider [place] or covering provider during after hours [place], for this patient?  Check the care team in [facility] and look for a) attending/consulting TRH provider listed and b) the [facility] team listed Log into [email or web address] and use [place]'s universal password to  access. If you do not have the password, please contact the hospital operator. Locate the [facility] provider you are looking for under [facility] and page to a number that you can be directly reached. If you still have difficulty reaching the provider, please page the [facility] (Director on Call) for the Hospitalists listed on amion for assistance.  01/24/2021, 10:03 AM

## 2021-01-24 NOTE — Progress Notes (Addendum)
Progress Note Hospital Day: 3  Chief Complaint:    GI bleed     Attending physician's note   I have taken an interval history, reviewed the chart and examined the patient. I agree with the Advanced Practitioner's note, impression and recommendations.   He had an episode of maroon blood per rectum this afternoon.  Repeat hemoglobin stable at 11.5 Currently on clear liquid diet If hemoglobin remains stable will advance diet and possible discharge home tomorrow  If has evidence of ongoing or recurrent lower GI bleed, plan for nuclear med bleeding scan   GI will continue to follow along  The patient was provided an opportunity to ask questions and all were answered. The patient agreed with the plan and demonstrated an understanding of the instructions.  [name] , MD [phone]     ASSESSMENT AND PLAN   # 87 yo male admitted with painless gastrointestinal bleeding. Suspect lower GI bleed, possibly diverticular hemorrhage.  Brisk upper bleed not excluded in setting of NSAIDs but seems unlikely.  --Hgb has stabilized at 11.6 without blood transfusion. Spoke with RN. Patient had a BM overnight with what sounds like a small amount of red to maroon colored blood and this am he had a smear of maroon blood. Overall it sounds like GI bleed is resolving.  --Will advance diet.   # Cognitive impairment, on Aricept at home. Today patient is excessively sleepy. Was in restraints last night,probably didn't sleep much and also he got Haldol ~ 8:30 pm      DIAGNOSTIC STUDIES THIS ADMISSION  none   PREVIOUS GI EVALUATIONS  July 2013 screening colonoscopy  Complete exam , excellent bowel prep.  Severe left sided diverticulosis, otherwise normal.    SUBJECTIVE   No complaints but excessively sleepy and not offering much       OBJECTIVE      Scheduled inpatient medications:   donepezil  10 mg Oral QHS   dutasteride  0.5 mg Oral Daily   lidocaine  1 patch Transdermal  Q24H   pantoprazole (PROTONIX) IV  40 mg Intravenous Q12H   Continuous inpatient infusions:   sodium chloride 75 mL/hr at 01/24/21 0701   PRN inpatient medications: acetaminophen **OR** acetaminophen, melatonin, ondansetron **OR** ondansetron (ZOFRAN) IV  Vital signs in last 24 hours: Temp:  [97.7 F (36.5 C)-98.1 F (36.7 C)] 98.1 F (36.7 C) (09/09 0847) Pulse Rate:  [59-72] 72 (09/09 0847) Resp:  [20-24] 24 (09/09 0847) BP: (148-163)/(77-79) 148/78 (09/09 0847) SpO2:  [94 %-99 %] 94 % (09/09 0847) Last BM Date: 01/23/21  Intake/Output Summary (Last 24 hours) at 01/24/2021 0858 Last data filed at 01/24/2021 0700 Gross per 24 hour  Intake 1083.95 ml  Output 1450 ml  Net -366.05 ml     Physical Exam:  General:  NAD. Wife in room Heart:  Regular rate and rhythm. No lower extremity edema Pulmonary: Normal respiratory effort Abdomen: Soft, nondistended, nontender. Normal bowel sounds.  Neurologic: Drowsy Psych: Cooperative   Filed Weights   01/22/21 1102  Weight: 65.8 kg    Intake/Output from previous day: 09/08 0701 - 09/09 0700 In: 1084 [P.O.:100; I.V.:984] Out: 1450 [Urine:1450] Intake/Output this shift: No intake/output data recorded.    Lab Results: Recent Labs    01/23/21 0402 01/23/21 1610 01/24/21 0451  WBC 12.9* 7.2 8.1  HGB 11.8* 11.8* 11.6*  HCT 35.8* 35.2* 34.3*  PLT 250 260 247   BMET Recent Labs    01/22/21 1125 01/23/21 0402  NA 138 136  K 4.4 3.7  CL 101 103  CO2 28 25  GLUCOSE 126* 104*  BUN 16 17  CREATININE 0.92 0.79  CALCIUM 8.8* 8.5*   LFT Recent Labs    01/23/21 0402  PROT 5.7*  ALBUMIN 3.6  AST 34  ALT 15  ALKPHOS 50  BILITOT 1.0   PT/INR No results for input(s): LABPROT, INR in the last 72 hours. Hepatitis Panel No results for input(s): HEPBSAG, HCVAB, HEPAIGM, HEPBIGM in the last 72 hours.  No results found.   Principal Problem:   Painless rectal bleeding Active Problems:   BPH (benign prostatic  hyperplasia)   Cognitive impairment   Rectal bleeding     LOS: 1 day   Tye Savoy ,NP 01/24/2021, 8:58 AM

## 2021-01-25 ENCOUNTER — Inpatient Hospital Stay (HOSPITAL_COMMUNITY): Payer: Medicare HMO

## 2021-01-25 DIAGNOSIS — R4189 Other symptoms and signs involving cognitive functions and awareness: Secondary | ICD-10-CM | POA: Diagnosis not present

## 2021-01-25 DIAGNOSIS — K5731 Diverticulosis of large intestine without perforation or abscess with bleeding: Secondary | ICD-10-CM | POA: Diagnosis not present

## 2021-01-25 DIAGNOSIS — K625 Hemorrhage of anus and rectum: Secondary | ICD-10-CM | POA: Diagnosis not present

## 2021-01-25 LAB — CBC
HCT: 36 % — ABNORMAL LOW (ref 39.0–52.0)
Hemoglobin: 11.7 g/dL — ABNORMAL LOW (ref 13.0–17.0)
MCH: 31.3 pg (ref 26.0–34.0)
MCHC: 32.5 g/dL (ref 30.0–36.0)
MCV: 96.3 fL (ref 80.0–100.0)
Platelets: 276 10*3/uL (ref 150–400)
RBC: 3.74 MIL/uL — ABNORMAL LOW (ref 4.22–5.81)
RDW: 13.2 % (ref 11.5–15.5)
WBC: 9.3 10*3/uL (ref 4.0–10.5)
nRBC: 0 % (ref 0.0–0.2)

## 2021-01-25 MED ORDER — GUAIFENESIN ER 600 MG PO TB12
600.0000 mg | ORAL_TABLET | Freq: Two times a day (BID) | ORAL | Status: DC
  Administered 2021-01-25 – 2021-01-29 (×9): 600 mg via ORAL
  Filled 2021-01-25 (×9): qty 1

## 2021-01-25 MED ORDER — GUAIFENESIN ER 600 MG PO TB12: 600.0000 mg | ORAL_TABLET | Freq: Two times a day (BID) | ORAL | Status: AC

## 2021-01-25 MED ORDER — ASPIRIN EC 81 MG PO TBEC: 81.0000 mg | DELAYED_RELEASE_TABLET | Freq: Every day | ORAL | 11 refills | Status: DC

## 2021-01-25 NOTE — Evaluation (Signed)
Clinical/Bedside Swallow Evaluation Patient Details  Name: Kevin Page MRN: [id number] Date of Birth: 11/27/1930  Today's Date: 01/25/2021 Time: SLP Start Time (ACUTE ONLY): 1340 SLP Stop Time (ACUTE ONLY): 1402 SLP Time Calculation (min) (ACUTE ONLY): 22 min  Past Medical History:  Past Medical History:  Diagnosis Date   Allergy    dust and mites   BPH (benign prostatic hyperplasia)    Essential hypertension    Hyperlipidemia    Macular degeneration    Osteoarthritis of both knees    Sinus bradycardia    SVT (supraventricular tachycardia) ([place])    a. Event monitor 2014: few episodes of SVT, longest 12 beats with rate 128.   Syncope    Past Surgical History:  Past Surgical History:  Procedure Laterality Date   CATARACT EXTRACTION W/ INTRAOCULAR LENS  IMPLANT, BILATERAL Bilateral 2011   dupytron contractions  1998 , 2006   both hands   LUMBAR FUSION  19917   HPI:  87 year old male with a history of BPH, cognitive impairment presents to ER on 01/22/21 with new onset painless rectal bleeding. Wife states the patient has taken approximately 4 over-the-counter Aleve in the last 8 days due to right shoulder pain. MD concerned with swallowing as pt was coughing during meal, so BSE ordered to r/o aspiration.  CXR on 01/25/21 negative for acute processes.   Assessment / Plan / Recommendation Clinical Impression  Pt seen for clincial swallowing evaluation with cognitive based oropharyngeal dysphagia noted paired with deconditioning precipitating oral holding, prolonged oral transit, anterior loss and delayed cough with soft solids and puree.  Pt also exhibits a delay in the initiation of the swallow which is expected at this age d/t presbyphagia with swallow triggering later due to the aging process.  Discussed swallowing precautions (ie: slow rate, small bites/sips, decrease distractions) with wife and initiating a downgraded diet of Dysphagia 2/thin liquids to assist with  mastication/decreasing fatigue during meals/snacks.  Wife agreeable to downgraded diet and feels this will be beneficial to pt as she has been "cutting up meats at home" to assist with ease of intake.  Discussed potential for MBS as an outpatient if swallowing issues persist when pt is back to baseline functioning.  Recommend initiating a Dysphagia 2 diet with thin liquids with above mentioned swallowing precautions in place.  ST will f/u for diet tolerance while in acute setting.Thank you for this consult. SLP Visit Diagnosis: Dysphagia, oropharyngeal phase (R13.12)    Aspiration Risk  Mild aspiration risk    Diet Recommendation   Dysphagia 2/thin liquids  Medication Administration: Crushed with puree    Other  Recommendations Oral Care Recommendations: Oral care BID   Follow up Recommendations Other (comment) (TBD)      Frequency and Duration min 1 x/week  1 week       Prognosis Prognosis for Safe Diet Advancement: Good      Swallow Study   General Date of Onset: 01/22/21 HPI: 87 year old male with a history of BPH, cognitive impairment presents to ER on 01/22/21 with new onset painless rectal bleeding. Wife states the patient has taken approximately 4 over-the-counter Aleve in the last 8 days due to right shoulder pain. MD concerned with swallowing as pt was coughing during meal, so BSE ordered to r/o aspiration.  CXR on 01/25/21 negative for acute processes. Type of Study: Bedside Swallow Evaluation Previous Swallow Assessment: n/a Diet Prior to this Study: Regular;Thin liquids Temperature Spikes Noted: No Respiratory Status: Room air History of Recent Intubation: No Behavior/Cognition:  Alert;Cooperative;Distractible;Requires cueing Oral Cavity Assessment: Excessive secretions Oral Care Completed by SLP: Yes (Pt completed with min cues) Oral Cavity - Dentition: Adequate natural dentition Vision: Functional for self-feeding Self-Feeding Abilities: Able to feed self;Needs  assist Patient Positioning: Upright in chair Baseline Vocal Quality: Low vocal intensity Volitional Cough: Weak Volitional Swallow: Able to elicit    Oral/Motor/Sensory Function Overall Oral Motor/Sensory Function: Other (comment) (grossly WFL; some lingual incoordination noted with ROM)   Ice Chips Ice chips: Not tested   Thin Liquid Thin Liquid: Impaired Presentation: Straw Oral Phase Impairments: Reduced labial seal Oral Phase Functional Implications: Right anterior spillage;Left anterior spillage;Oral holding Pharyngeal  Phase Impairments: Suspected delayed Swallow    Nectar Thick Nectar Thick Liquid: Not tested   Honey Thick Honey Thick Liquid: Not tested   Puree Puree: Impaired Presentation: Spoon Oral Phase Functional Implications: Oral holding Pharyngeal Phase Impairments: Suspected delayed Swallow;Cough - Delayed   Solid     Solid: Impaired Presentation: Spoon Oral Phase Impairments: Impaired mastication Oral Phase Functional Implications: Prolonged oral transit;Impaired mastication Pharyngeal Phase Impairments: Suspected delayed Swallow;Cough - Delayed      Elvina Sidle, M.S., CCC-SLP 01/25/2021,2:16 PM

## 2021-01-25 NOTE — Progress Notes (Addendum)
Pt is injury free, afebrile, alert, and oriented X 1. Vital signs were within the baseline during this shift. Due to confusion, soft restraints were applied to pt at the beginning of the change to promote safety; we will continue to monitor and reevaluate the need for soft restraints. Pt denies chest pain, SOB, nausea, vomiting, dizziness, signs or symptoms of bleeding or infection or acute changes during this shift. No bowel movement during this shift. We will continue to monitor and work toward achieving the care plan goals.

## 2021-01-25 NOTE — Progress Notes (Addendum)
Pt is showing A-fib on monitor with controlled HR. Tele showed some concern for A-fib. EKG obtained and hospitalist on call Jeannette Corpus, NP) was notified. Pt denies chest pain, flutter, or SOB. No A-fib was detected in the EKG

## 2021-01-25 NOTE — TOC Transition Note (Addendum)
Transition of Care [facility]) - CM/SW Discharge Note   Patient Details  Name: Kevin Page MRN: [id number] Date of Birth: Oct 05, 1930  Transition of Care [facility]) CM/SW Contact:  Ross Ludwig, LCSW Phone Number: 01/25/2021, 12:55 PM   Clinical Narrative:     Patient will be going home with home health PT, RN, aide, and SLP through [facility]).  CSW signing off please reconsult with any other social work needs, home health agency has been notified of planned discharge.  Patient's wife is aware of patient discharging today.  3:30pm  Patient and family are now considering SNF placement for patient for short term rehab.  CSW explained the process to patient's wife, and also how insurance will pay for stay.  CSW also explained what happens if insurance company denies patient.  Patient's wife requested an email explaining the process and details on what is done.  Patient's wife will look over information, and she also gave CSW permission to begin bed search in [facility].  CSW explained that if she decides to take patient home, and he still needs SNF the home health agency can help place patient from home it just takes more time.  Final next level of care: [place] Barriers to Discharge: Barriers Resolved   Patient Goals and CMS Choice Patient states their goals for this hospitalization and ongoing recovery are:: To return back home with home health. CMS Medicare.gov Compare Post Acute Care list provided to:: Patient Represenative (must comment) Choice offered to / list presented to : Spouse, Patient  Discharge Placement  Home health verse SNF.                     Discharge Plan and Services  Home verse SNF.                        HH Arranged: RN, PT, Nurse's Aide, Speech Therapy HH Agency: [place] Date [facility] Agency Contacted: 01/25/21 Time [place]: [id number] Representative spoke with at [place]: [place] ([place]) Interventions     Readmission Risk Interventions No flowsheet data found.

## 2021-01-25 NOTE — NC FL2 (Signed)
[place] LEVEL OF CARE SCREENING TOOL     IDENTIFICATION  Patient Name: Kevin Page Birthdate: Oct 24, 1930 Sex: male Admission Date (Current Location): 01/22/2021  [facility] and [place] Number:  Herbalist and Address:  [facility],  [place], [place]      Provider Number: [id number]  Attending Physician Name and Address:  Florencia Reasons, MD  Relative Name and Phone Number:  Vihas, Mcphearson [phone]  [phone]  Pearlman,maryann Sister   [phone]    Current Level of Care: Hospital Recommended Level of Care: [place] Prior Approval Number:    Date Approved/Denied:   PASRR Number: [id number]  Discharge Plan: SNF    Current Diagnoses: Patient Active Problem List   Diagnosis Date Noted   Diverticular hemorrhage    Rectal bleeding 01/23/2021   Painless rectal bleeding 01/22/2021   Cognitive impairment 01/22/2021   SVT (supraventricular tachycardia) (HCC)    Sinus bradycardia    Osteoarthritis of both knees    Macular degeneration    Essential hypertension    Allergy    Advanced dry age-related macular degeneration of both eyes with subfoveal involvement 03/03/2016   Hypertensive retinopathy of both eyes 03/03/2016   Pseudophakia of both eyes 03/03/2016   PVD (posterior vitreous detachment), both eyes 03/03/2016   Orthostasis 01/19/2016   Dilated aortic root ([place]) 01/19/2016   PSVT (paroxysmal supraventricular tachycardia) ([place]) 01/19/2016   Mild aortic insufficiency 01/19/2016   Sinus bradycardia seen on cardiac monitor 01/18/2016   Syncope 01/12/2013   Hypertension 01/12/2013   Hyperlipidemia 01/12/2013   BPH (benign prostatic hyperplasia) 01/12/2013    Orientation RESPIRATION BLADDER Height & Weight     Place, Self  Normal Incontinent Weight: 145 lb (65.8 kg) Height:  6' (182.9 cm)  BEHAVIORAL SYMPTOMS/MOOD NEUROLOGICAL BOWEL NUTRITION STATUS      Incontinent Diet (Dysphagia 2  diet)  AMBULATORY STATUS COMMUNICATION OF NEEDS Skin   Limited Assist Verbally Normal                       Personal Care Assistance Level of Assistance  Bathing, Feeding, Dressing Bathing Assistance: Limited assistance Feeding assistance: Limited assistance Dressing Assistance: Limited assistance     Functional Limitations Info  Sight, Hearing, Speech Sight Info: Adequate Hearing Info: Adequate Speech Info: Adequate    SPECIAL CARE FACTORS FREQUENCY  PT (By licensed PT), OT (By licensed OT), Speech therapy     PT Frequency: Minimum 5x a week OT Frequency: Minimum 5x a week     Speech Therapy Frequency: Minimum 3 x a week      Contractures Contractures Info: Not present    Additional Factors Info  Code Status, Allergies Code Status Info: Full Code Allergies Info: NKA           Current Medications (01/25/2021):  This is the current hospital active medication list Current Facility-Administered Medications  Medication Dose Route Frequency Provider Last Rate Last Admin   acetaminophen (TYLENOL) tablet 650 mg  650 mg Oral Q6H PRN Kristopher Oppenheim, DO       Or   acetaminophen (TYLENOL) suppository 650 mg  650 mg Rectal Q6H PRN Kristopher Oppenheim, DO       donepezil (ARICEPT) tablet 10 mg  10 mg Oral QHS Kristopher Oppenheim, DO   10 mg at 01/24/21 2243   dutasteride (AVODART) capsule 0.5 mg  0.5 mg Oral Daily Kristopher Oppenheim, DO   0.5 mg at 01/25/21 0936   guaiFENesin (  MUCINEX) 12 hr tablet 600 mg  600 mg Oral BID Florencia Reasons, MD   600 mg at 01/25/21 1508   lidocaine (LIDODERM) 5 % 1 patch  1 patch Transdermal Q24H Eulogio Bear U, DO   1 patch at 01/23/21 1445   melatonin tablet 3 mg  3 mg Oral QHS PRN Kristopher Oppenheim, DO   3 mg at 01/24/21 2243   ondansetron (ZOFRAN) tablet 4 mg  4 mg Oral Q6H PRN Kristopher Oppenheim, DO       Or   ondansetron [facility]) injection 4 mg  4 mg Intravenous Q6H PRN Kristopher Oppenheim, DO       pantoprazole (PROTONIX) injection 40 mg  40 mg Intravenous Q12H Willia Craze, NP   40 mg at  01/25/21 [id number]     Discharge Medications: Please see discharge summary for a list of discharge medications.  Relevant Imaging Results:  Relevant Lab Results:   Additional Information SSN [id number]  Ross Ludwig, LCSW

## 2021-01-25 NOTE — Evaluation (Signed)
Physical Therapy Evaluation Patient Details Name: Kevin Page MRN: [id number] DOB: [birth year] Today's Date: 01/25/2021   History of Present Illness  Pt is 87 yo male admiited with painless rectal bleeding (likely diverticular) on 01/22/21.  Pt with hx of dementia and BPH.  Clinical Impression  Pt admitted with above diagnosis. At baseline, pt ambulates in home with cane independently, he does have supervision with showers for safety and cues.  He lives with his wife with 5 steps to enter home.  During evaluation today, pt requiring min A for tranfers and mod A for gait and stairs with easily fatigued.  He had multiple LOB requiring assist for stability.  Pt is well below his baseline and will benefit from further therapy.  Pt currently with functional limitations due to the deficits listed below (see PT Problem List). Pt will benefit from skilled PT to increase their independence and safety with mobility to allow discharge to the venue listed below.       Follow Up Recommendations SNF    Equipment Recommendations  None recommended by PT    Recommendations for Other Services       Precautions / Restrictions Precautions Precautions: Fall      Mobility  Bed Mobility Overal bed mobility: Needs Assistance             General bed mobility comments: in chair - RN reports required assist    Transfers Overall transfer level: Needs assistance Equipment used: Rolling walker (2 wheeled) Transfers: Sit to/from Stand Sit to Stand: Min assist         General transfer comment: Performed x 2; increased time to rise with min A to lift and steady  Ambulation/Gait Ambulation/Gait assistance: Mod assist Gait Distance (Feet): 80 Feet Assistive device: Rolling walker (2 wheeled) Gait Pattern/deviations: Step-to pattern;Decreased stride length;Wide base of support;Trunk flexed Gait velocity: decreased   General Gait Details: Wide base of support with knees flexed and genu valgus.  Trunk  flexed  (wife reports baseline).  Requiring frequent cues and assist for posture and RW proximity. Min guard but had at least 3 LOB requiring mod A and as fatigued knees flexed more  Stairs Stairs: Yes Stairs assistance: Mod assist;+2 safety/equipment Stair Management: Two rails;Step to pattern;Forwards Number of Stairs: 5 General stair comments: Cued for step to pattern; mod A to stabilize and as he fatigued knees flexed more.  Offered rest break but pt wanted to return to room.  RN present for safety. Max cues.  Wheelchair Mobility    Modified Rankin (Stroke Patients Only)       Balance Overall balance assessment: Needs assistance Sitting-balance support: No upper extremity supported Sitting balance-Leahy Scale: Good     Standing balance support: Bilateral upper extremity supported Standing balance-Leahy Scale: Poor Standing balance comment: requiring RW and assist; difficulty balancing for toileing requiring guard rails and assist                             Pertinent Vitals/Pain Pain Assessment: No/denies pain    Home Living Family/patient expects to be discharged to:: Private residence Living Arrangements: Spouse/significant other Available Help at Discharge: Family;Available 24 hours/day Type of Home: House Home Access: Stairs to enter Entrance Stairs-Rails: Psychiatric nurse of Steps: 5 Home Layout: Two level;Able to live on main level with bedroom/bathroom Home Equipment: Grab bars - tub/shower;Cane - single point;Walker - 2 wheels;Wheelchair - manual      Prior Function Level of Independence:  Needs assistance   Gait / Transfers Assistance Needed: Walking with a cane ; can do short community distances  ADL's / Homemaking Assistance Needed: Independent with ADLS except wife provides supervision and cues for showers.  Wife does IADLs  Comments: Only 1 fall at home when tried to sit too early     Hand Dominance         Extremity/Trunk Assessment   Upper Extremity Assessment Upper Extremity Assessment: Generalized weakness    Lower Extremity Assessment Lower Extremity Assessment: LLE deficits/detail;RLE deficits/detail RLE Deficits / Details: ROM: bil knees lacking 10-15 degrees ext (baseline per wife); MMT: 4/5 throughout LLE Deficits / Details: ROM: bil knees lacking 10-15 degrees ext (baseline per wife); MMT: 4/5 throughout    Cervical / Trunk Assessment Cervical / Trunk Assessment: Kyphotic  Communication   Communication: No difficulties  Cognition Arousal/Alertness: Awake/alert Behavior During Therapy: WFL for tasks assessed/performed Overall Cognitive Status: History of cognitive impairments - at baseline                                 General Comments: Followed basic commands.  Oriented to self and location      General Comments General comments (skin integrity, edema, etc.): Spoke with pt and wife about recommendation for SNF due to decreased mobility and fall risk    Exercises     Assessment/Plan    PT Assessment Patient needs continued PT services  PT Problem List Decreased strength;Decreased mobility;Decreased safety awareness;Decreased range of motion;Decreased coordination;Decreased activity tolerance;Decreased cognition;Decreased balance;Decreased knowledge of use of DME       PT Treatment Interventions DME instruction;Therapeutic activities;Cognitive remediation;Gait training;Therapeutic exercise;Patient/family education;Stair training;Balance training;Functional mobility training    PT Goals (Current goals can be found in the Care Plan section)  Acute Rehab PT Goals Patient Stated Goal: return home; wife agreeable to SNF due to level of assist required PT Goal Formulation: With patient/family Time For Goal Achievement: 02/08/21 Potential to Achieve Goals: Good    Frequency Min 3X/week   Barriers to discharge Inaccessible home environment       Co-evaluation               AM-PAC PT "6 Clicks" Mobility  Outcome Measure Help needed turning from your back to your side while in a flat bed without using bedrails?: A Little Help needed moving from lying on your back to sitting on the side of a flat bed without using bedrails?: A Little Help needed moving to and from a bed to a chair (including a wheelchair)?: A Little Help needed standing up from a chair using your arms (e.g., wheelchair or bedside chair)?: A Little Help needed to walk in hospital room?: A Lot Help needed climbing 3-5 steps with a railing? : Total 6 Click Score: 15    End of Session Equipment Utilized During Treatment: Gait belt Activity Tolerance: Patient tolerated treatment well Patient left: with chair alarm set;in chair;with call bell/phone within reach;with family/visitor present Nurse Communication: Mobility status PT Visit Diagnosis: Unsteadiness on feet (R26.81);Muscle weakness (generalized) (M62.81)    Time: [id number] PT Time Calculation (min) (ACUTE ONLY): 36 min   Charges:   PT Evaluation $PT Eval Moderate Complexity: 1 Mod PT Treatments $Gait Training: 8-22 mins        Abran Richard, PT Acute Rehab Services Pager [phone] [facility] [place] 01/25/2021, 1:36 PM

## 2021-01-25 NOTE — Progress Notes (Signed)
[facility] ROUNDING NOTE   Subjective: Hgb stable.  He had a small dark brown stool this morning.   Objective: Vital signs in last 24 hours: Temp:  [97.8 F (36.6 C)-98.7 F (37.1 C)] 97.8 F (36.6 C) (09/10 0529) Pulse Rate:  [60-73] 60 (09/10 0529) Resp:  [18-20] 20 (09/10 0529) BP: (142-153)/(74-102) 142/74 (09/10 0529) SpO2:  [95 %-97 %] 96 % (09/10 0529) Last BM Date: 01/24/21 General: NAD Abdomen: Soft, no tenderness or distention Ext: No edema    Intake/Output from previous day: 09/09 0701 - 09/10 0700 In: 50 [P.O.:50] Out: -  Intake/Output this shift: No intake/output data recorded.   Lab Results: Recent Labs    01/23/21 1610 01/24/21 0451 01/24/21 1614  WBC 7.2 8.1 8.0  HGB 11.8* 11.6* 11.5*  PLT 260 247 266  MCV 94.9 95.0 95.6   BMET Recent Labs    01/22/21 1125 01/23/21 0402  NA 138 136  K 4.4 3.7  CL 101 103  CO2 28 25  GLUCOSE 126* 104*  BUN 16 17  CREATININE 0.92 0.79  CALCIUM 8.8* 8.5*   LFT Recent Labs    01/22/21 1125 01/23/21 0402  PROT 6.2* 5.7*  ALBUMIN 3.8 3.6  AST 20 34  ALT 12 15  ALKPHOS 50 50  BILITOT 0.8 1.0   PT/INR No results for input(s): INR in the last 72 hours.    Imaging/Other results: No results found.    Assessment &Plan  26 yr M with painless hematochezia, likely etiology diverticular hemorrhage No further bleeding, Hgb stable Advance diet as tolerated  Okay to discharge home from GI standpoint  We will arrange for follow-up H&H next week and GI office follow-up visit next available appointment with Dr. Carlean Purl or app.    [name] , MD [phone]  [facility]

## 2021-01-25 NOTE — Progress Notes (Signed)
Pt's BP 177/82 and asymptomatic (sleeping). The hospitalist  Jeannette Corpus, NP) on call was notified. Will continue to monitor

## 2021-01-25 NOTE — Plan of Care (Signed)
  Problem: Clinical Measurements: Goal: Will remain free from infection Outcome: Progressing   Problem: Clinical Measurements: Goal: Diagnostic test results will improve Outcome: Progressing   Problem: Clinical Measurements: Goal: Respiratory complications will improve Outcome: Progressing   Problem: Clinical Measurements: Goal: Cardiovascular complication will be avoided Outcome: Progressing   Problem: Activity: Goal: Risk for activity intolerance will decrease Outcome: Progressing   Problem: Nutrition: Goal: Adequate nutrition will be maintained Outcome: Progressing   Problem: Coping: Goal: Level of anxiety will decrease Outcome: Progressing   Problem: Elimination: Goal: Will not experience complications related to bowel motility Outcome: Progressing   Problem: Elimination: Goal: Will not experience complications related to urinary retention Outcome: Progressing   Problem: Skin Integrity: Goal: Risk for impaired skin integrity will decrease Outcome: Progressing   Problem: Safety: Goal: Non-violent Restraint(s) Outcome: Progressing

## 2021-01-25 NOTE — Progress Notes (Signed)
PROGRESS NOTE    Kevin Page  [id number] DOB: 1930-12-17 DOA: 01/22/2021 PCP: Ginger Organ., MD    Chief Complaint  Patient presents with   Rectal Bleeding    Brief Narrative:  Kevin Page is an 87 y.o. male with a history of BPH, cognitive impairment presents to the droppage ER today with new onset painless rectal bleeding.  Suspect lower GI bleed, conservative management planned.  Hgb stable.  From home with wife and has recently had home health  Subjective:  Continue to have dark stool /clot  but hgb stable He has intermittent cough, denies chest pain, no sob, no hypoxia, wife reports he coughs after meal He wants to go home, but appears agreed to go to snf due to weakness Wife at bedside   Assessment & Plan:   Principal Problem:   Painless rectal bleeding Active Problems:   BPH (benign prostatic hyperplasia)   Cognitive impairment   Rectal bleeding   Diverticular hemorrhage    Cough, wife reports patient started to cough after meals since in the hospital, cxr no acute findings Will ask speech to see him  Start mucinex, incentive spirometer  Painless rectal bleeding -Seen by GI, recommend conservative measurement -Hemoglobin has been stable -GI recommend outpatient follow-up with Dr. Carlean Purl  BPH Continue home medication  Dementia On Aricept at home Delirium precautions  Failure to thrive Wife report he is weaker than normal, seen by PT recommend skilled nursing facility   Nutritional Assessment:  The patient's BMI is: Body mass index is 19.67 kg/m.[name]       Unresulted Labs (From admission, onward)    None         DVT prophylaxis: SCDs Start: 01/22/21 1611   Code Status: Family Communication: wife at bedside, wife's brother Dr Redmond Pulling over the phone by request  Disposition:   Status is: Inpatient  Dispo: The patient is from: home               Anticipated d/c is to: SNF              Anticipated d/c date is: medically stable  to discharge, awaiting for snf placement              Consultants:  gi  Procedures:  none  Antimicrobials:    Anti-infectives (From admission, onward)    None          Objective: Vitals:   01/25/21 0525 01/25/21 0529 01/25/21 1405 01/25/21 2116  BP: (!) 142/74 (!) 142/74 121/74 (!) 177/82  Pulse: 62 60 74 67  Resp: '20 20 17 20  '$ Temp: 97.8 F (36.6 C) 97.8 F (36.6 C) 97.8 F (36.6 C) 97.9 F (36.6 C)  TempSrc: Oral Oral    SpO2: 97% 96% 95% 99%  Weight:      Height:        Intake/Output Summary (Last 24 hours) at 01/25/2021 2150 Last data filed at 01/25/2021 1651 Gross per 24 hour  Intake 236 ml  Output --  Net 236 ml   Filed Weights   01/22/21 1102  Weight: 65.8 kg    Examination:  General exam: Frail elderly, oriented to person, though he is in hospital, not oriented to time Respiratory system: Mild crackles right lower lobe, no wheezing, no rhonchi,. Respiratory effort normal. Cardiovascular system: S1 & S2 heard, RRR. No JVD, no murmur, No pedal edema. Gastrointestinal system: Abdomen is nondistended, soft and nontender.  Normal bowel sounds heard. Central nervous system: Alert and  oriented to person and place, no focal neurological deficits. Extremities: No edema Skin: No rashes, lesions or ulcers Psychiatry: Confused, currently no agitation  Data Reviewed: I have personally reviewed following labs and imaging studies  CBC: Recent Labs  Lab 01/22/21 1125 01/22/21 1627 01/23/21 0402 01/23/21 1610 01/24/21 0451 01/24/21 1614 01/25/21 1129  WBC 7.1   < > 12.9* 7.2 8.1 8.0 9.3  NEUTROABS 4.9  --   --   --   --   --   --   HGB 13.4   < > 11.8* 11.8* 11.6* 11.5* 11.7*  HCT 40.5   < > 35.8* 35.2* 34.3* 34.4* 36.0*  MCV 94.2   < > 95.0 94.9 95.0 95.6 96.3  PLT 282   < > 250 260 247 266 276   < > = values in this interval not displayed.    Basic Metabolic Panel: Recent Labs  Lab 01/22/21 1125 01/23/21 0402  NA 138 136  K 4.4 3.7  CL  101 103  CO2 28 25  GLUCOSE 126* 104*  BUN 16 17  CREATININE 0.92 0.79  CALCIUM 8.8* 8.5*    GFR: Estimated Creatinine Clearance: 60.5 mL/min (by C-G formula based on SCr of 0.79 mg/dL).  Liver Function Tests: Recent Labs  Lab 01/22/21 1125 01/23/21 0402  AST 20 34  ALT 12 15  ALKPHOS 50 50  BILITOT 0.8 1.0  PROT 6.2* 5.7*  ALBUMIN 3.8 3.6    CBG: No results for input(s): GLUCAP in the last 168 hours.   Recent Results (from the past 240 hour(s))  Resp Panel by RT-PCR (Flu A&B, Covid) Nasopharyngeal Swab     Status: None   Collection Time: 01/22/21 11:35 AM   Specimen: Nasopharyngeal Swab; Nasopharyngeal(NP) swabs in vial transport medium  Result Value Ref Range Status   SARS Coronavirus 2 by RT PCR NEGATIVE NEGATIVE Final    Comment: (NOTE) SARS-CoV-2 target nucleic acids are NOT DETECTED.  The SARS-CoV-2 RNA is generally detectable in upper respiratory specimens during the acute phase of infection. The lowest concentration of SARS-CoV-2 viral copies this assay can detect is 138 copies/mL. A negative result does not preclude SARS-Cov-2 infection and should not be used as the sole basis for treatment or other patient management decisions. A negative result may occur with  improper specimen collection/handling, submission of specimen other than nasopharyngeal swab, presence of viral mutation(s) within the areas targeted by this assay, and inadequate number of viral copies(<138 copies/mL). A negative result must be combined with clinical observations, patient history, and epidemiological information. The expected result is Negative.  Fact Sheet for Patients:  EntrepreneurPulse.com.au  Fact Sheet for Healthcare Providers:  IncredibleEmployment.be  This test is no t yet approved or cleared by the [place] FDA and  has been authorized for detection and/or diagnosis of SARS-CoV-2 by FDA under an Emergency Use Authorization  (EUA). This EUA will remain  in effect (meaning this test can be used) for the duration of the COVID-19 declaration under Section 564(b)(1) of the Act, 21 U.S.C.section 360bbb-3(b)(1), unless the authorization is terminated  or revoked sooner.       Influenza A by PCR NEGATIVE NEGATIVE Final   Influenza B by PCR NEGATIVE NEGATIVE Final    Comment: (NOTE) The Xpert Xpress SARS-CoV-2/FLU/RSV plus assay is intended as an aid in the diagnosis of influenza from Nasopharyngeal swab specimens and should not be used as a sole basis for treatment. Nasal washings and aspirates are unacceptable for Xpert Xpress SARS-CoV-2/FLU/RSV testing.  Fact Sheet for Patients: EntrepreneurPulse.com.au  Fact Sheet for Healthcare Providers: IncredibleEmployment.be  This test is not yet approved or cleared by the [place] FDA and has been authorized for detection and/or diagnosis of SARS-CoV-2 by FDA under an Emergency Use Authorization (EUA). This EUA will remain in effect (meaning this test can be used) for the duration of the COVID-19 declaration under Section 564(b)(1) of the Act, 21 U.S.C. section 360bbb-3(b)(1), unless the authorization is terminated or revoked.  Performed at [facility], [address], [place], [place] [address]          Radiology Studies: DG CHEST PORT 1 VIEW  Result Date: 01/25/2021 CLINICAL DATA:  Cough. EXAM: PORTABLE CHEST 1 VIEW COMPARISON:  Chest x-ray dated January 18, 2016. FINDINGS: Normal heart size. Normal pulmonary vascularity. Unchanged elevation of the left hemidiaphragm No focal consolidation, pleural effusion, or pneumothorax. No acute osseous abnormality. IMPRESSION: 1. No active disease. Electronically Signed   By: Titus Dubin M.D.   On: 01/25/2021 13:06        Scheduled Meds:  donepezil  10 mg Oral QHS   dutasteride  0.5 mg Oral Daily   guaiFENesin  600 mg Oral BID   lidocaine  1  patch Transdermal Q24H   pantoprazole (PROTONIX) IV  40 mg Intravenous Q12H   Continuous Infusions:   LOS: 2 days   Time spent: 63mns Greater than 50% of this time was spent in counseling, explanation of diagnosis, planning of further management, and coordination of care.   Voice Recognition /Viviann Sparedictation system was used to create this note, attempts have been made to correct errors. Please contact the author with questions and/or clarifications.   FFlorencia Reasons MD PhD FACP [facility]  Available via Epic secure chat 7am-7pm for nonurgent issues Please page for urgent issues To page the attending provider between 7A-7P or the covering provider during after hours 7P-7A, please log into the web site [email or web address] and access using universal  password for that web site. If you do not have the password, please call the hospital operator.    01/25/2021, 9:50 PM

## 2021-01-26 NOTE — Progress Notes (Signed)
PROGRESS NOTE    Kevin Page  [id number] DOB: [birth year] DOA: 01/22/2021 PCP: Ginger Organ., MD    Chief Complaint  Patient presents with   Rectal Bleeding    Brief Narrative:  Kevin Page is an 87 y.o. male with a history of BPH, cognitive impairment presents to the droppage ER today with new onset painless rectal bleeding.  Suspect lower GI bleed, conservative management planned.  Hgb stable.  From home with wife and has recently had home health  Subjective:  No cough heard this morning  He is sitting up in chair, no agitation He wants to go home, but appears agreed to go to snf due to weakness, wife states he is too weak to go home   Assessment & Plan:   Principal Problem:   Painless rectal bleeding Active Problems:   BPH (benign prostatic hyperplasia)   Cognitive impairment   Rectal bleeding   Diverticular hemorrhage    Cough, wife reports patient started to cough after meals since in the hospital, cxr no acute findings Seen by speech who recommended Dysphagia 2/thin liquids/Medication Administration: Crushed with puree   mucinex, incentive spirometer  Painless rectal bleeding -Seen by GI, recommend conservative measurement -Hemoglobin has been stable -GI recommend outpatient follow-up with Dr. Carlean Purl  BPH Continue home medication  Dementia On Aricept at home Delirium precautions  Failure to thrive Wife report he is weaker than normal, seen by PT recommend skilled nursing facility Awaiting for snf placement, wife states he is too weak to go home   Nutritional Assessment:  The patient's BMI is: Body mass index is 19.67 kg/m.[name]       Unresulted Labs (From admission, onward)    None         DVT prophylaxis: SCDs Start: 01/22/21 1611   Code Status: Family Communication: wife at bedside, wife's brother Dr Redmond Pulling over the phone by request on 9/10  Disposition:   Status is: Inpatient  Dispo: The patient is from: home                Anticipated d/c is to: SNF              Anticipated d/c date is: medically stable to discharge, awaiting for snf placement              Consultants:  gi  Procedures:  none  Antimicrobials:    Anti-infectives (From admission, onward)    None          Objective: Vitals:   01/25/21 1405 01/25/21 2116 01/26/21 0406 01/26/21 1314  BP: 121/74 (!) 177/82 (!) 148/75 140/78  Pulse: 74 67 (!) 56 65  Resp: '17 20 18 18  '$ Temp: 97.8 F (36.6 C) 97.9 F (36.6 C) 97.9 F (36.6 C) 97.7 F (36.5 C)  TempSrc:      SpO2: 95% 99% 97% 100%  Weight:      Height:        Intake/Output Summary (Last 24 hours) at 01/26/2021 1915 Last data filed at 01/26/2021 1829 Gross per 24 hour  Intake 472 ml  Output --  Net 472 ml   Filed Weights   01/22/21 1102  Weight: 65.8 kg    Examination:  General exam: Frail elderly, oriented to person, though he is in hospital, not oriented to time Respiratory system: Mild crackles right lower lobe, no wheezing, no rhonchi,. Respiratory effort normal. Cardiovascular system: S1 & S2 heard, RRR. No JVD, no murmur, No pedal edema. Gastrointestinal system: Abdomen is  nondistended, soft and nontender.  Normal bowel sounds heard. Central nervous system: Alert and oriented to person and place, no focal neurological deficits. Extremities: No edema Skin: No rashes, lesions or ulcers Psychiatry: Confused, currently no agitation  Data Reviewed: I have personally reviewed following labs and imaging studies  CBC: Recent Labs  Lab 01/22/21 1125 01/22/21 1627 01/23/21 0402 01/23/21 1610 01/24/21 0451 01/24/21 1614 01/25/21 1129  WBC 7.1   < > 12.9* 7.2 8.1 8.0 9.3  NEUTROABS 4.9  --   --   --   --   --   --   HGB 13.4   < > 11.8* 11.8* 11.6* 11.5* 11.7*  HCT 40.5   < > 35.8* 35.2* 34.3* 34.4* 36.0*  MCV 94.2   < > 95.0 94.9 95.0 95.6 96.3  PLT 282   < > 250 260 247 266 276   < > = values in this interval not displayed.    Basic Metabolic  Panel: Recent Labs  Lab 01/22/21 1125 01/23/21 0402  NA 138 136  K 4.4 3.7  CL 101 103  CO2 28 25  GLUCOSE 126* 104*  BUN 16 17  CREATININE 0.92 0.79  CALCIUM 8.8* 8.5*    GFR: Estimated Creatinine Clearance: 60.5 mL/min (by C-G formula based on SCr of 0.79 mg/dL).  Liver Function Tests: Recent Labs  Lab 01/22/21 1125 01/23/21 0402  AST 20 34  ALT 12 15  ALKPHOS 50 50  BILITOT 0.8 1.0  PROT 6.2* 5.7*  ALBUMIN 3.8 3.6    CBG: No results for input(s): GLUCAP in the last 168 hours.   Recent Results (from the past 240 hour(s))  Resp Panel by RT-PCR (Flu A&B, Covid) Nasopharyngeal Swab     Status: None   Collection Time: 01/22/21 11:35 AM   Specimen: Nasopharyngeal Swab; Nasopharyngeal(NP) swabs in vial transport medium  Result Value Ref Range Status   SARS Coronavirus 2 by RT PCR NEGATIVE NEGATIVE Final    Comment: (NOTE) SARS-CoV-2 target nucleic acids are NOT DETECTED.  The SARS-CoV-2 RNA is generally detectable in upper respiratory specimens during the acute phase of infection. The lowest concentration of SARS-CoV-2 viral copies this assay can detect is 138 copies/mL. A negative result does not preclude SARS-Cov-2 infection and should not be used as the sole basis for treatment or other patient management decisions. A negative result may occur with  improper specimen collection/handling, submission of specimen other than nasopharyngeal swab, presence of viral mutation(s) within the areas targeted by this assay, and inadequate number of viral copies(<138 copies/mL). A negative result must be combined with clinical observations, patient history, and epidemiological information. The expected result is Negative.  Fact Sheet for Patients:  EntrepreneurPulse.com.au  Fact Sheet for Healthcare Providers:  IncredibleEmployment.be  This test is no t yet approved or cleared by the [place] FDA and  has been authorized for  detection and/or diagnosis of SARS-CoV-2 by FDA under an Emergency Use Authorization (EUA). This EUA will remain  in effect (meaning this test can be used) for the duration of the COVID-19 declaration under Section 564(b)(1) of the Act, 21 U.S.C.section 360bbb-3(b)(1), unless the authorization is terminated  or revoked sooner.       Influenza A by PCR NEGATIVE NEGATIVE Final   Influenza B by PCR NEGATIVE NEGATIVE Final    Comment: (NOTE) The Xpert Xpress SARS-CoV-2/FLU/RSV plus assay is intended as an aid in the diagnosis of influenza from Nasopharyngeal swab specimens and should not be used as a sole  basis for treatment. Nasal washings and aspirates are unacceptable for Xpert Xpress SARS-CoV-2/FLU/RSV testing.  Fact Sheet for Patients: EntrepreneurPulse.com.au  Fact Sheet for Healthcare Providers: IncredibleEmployment.be  This test is not yet approved or cleared by the [place] FDA and has been authorized for detection and/or diagnosis of SARS-CoV-2 by FDA under an Emergency Use Authorization (EUA). This EUA will remain in effect (meaning this test can be used) for the duration of the COVID-19 declaration under Section 564(b)(1) of the Act, 21 U.S.C. section 360bbb-3(b)(1), unless the authorization is terminated or revoked.  Performed at [facility], [address], [place], [place] [address]          Radiology Studies: DG CHEST PORT 1 VIEW  Result Date: 01/25/2021 CLINICAL DATA:  Cough. EXAM: PORTABLE CHEST 1 VIEW COMPARISON:  Chest x-ray dated January 18, 2016. FINDINGS: Normal heart size. Normal pulmonary vascularity. Unchanged elevation of the left hemidiaphragm No focal consolidation, pleural effusion, or pneumothorax. No acute osseous abnormality. IMPRESSION: 1. No active disease. Electronically Signed   By: Titus Dubin M.D.   On: 01/25/2021 13:06        Scheduled Meds:  donepezil  10 mg Oral  QHS   dutasteride  0.5 mg Oral Daily   guaiFENesin  600 mg Oral BID   lidocaine  1 patch Transdermal Q24H   pantoprazole (PROTONIX) IV  40 mg Intravenous Q12H   Continuous Infusions:   LOS: 3 days   Time spent: 62mns Greater than 50% of this time was spent in counseling, explanation of diagnosis, planning of further management, and coordination of care.   Voice Recognition /Viviann Sparedictation system was used to create this note, attempts have been made to correct errors. Please contact the author with questions and/or clarifications.   FFlorencia Reasons MD PhD FACP [facility]  Available via Epic secure chat 7am-7pm for nonurgent issues Please page for urgent issues To page the attending provider between 7A-7P or the covering provider during after hours 7P-7A, please log into the web site [email or web address] and access using universal [place] password for that web site. If you do not have the password, please call the hospital operator.    01/26/2021, 7:15 PM

## 2021-01-26 NOTE — Progress Notes (Signed)
Pt is injury free, afebrile, alert, and oriented X 1. Vital signs were within the baseline during this shift. Pt denies chest pain, SOB, nausea, vomiting, dizziness, or acute changes during this shift. Pt had small bowel movement "black stool' during this shift. We will continue to monitor and work toward achieving the care plan goals

## 2021-01-26 NOTE — TOC Progression Note (Addendum)
Transition of Care [facility]) - Progression Note    Patient Details  Name: Kevin Page MRN: [id number] Date of Birth: 23-Feb-1931  Transition of Care [facility]) CM/SW [place], [place] Phone Number: [phone] 01/26/2021, 3:20 PM  Clinical Narrative:     CSW spoke with patient's Kevin Page, Kevin Page (Spouse) [phone] [facility]) requesting update on SNF placement search.  [name] chose [facility] for SNF placement.  CSW explained the SNF placement process, including insurance auth process, and gave [name] an estimated 24-72 hour processing time.  CSW contacted [name] at [facility] [phone], stated he would run insurance Authorization. [name] requested someone from [facility] contact for an update tomorrow 01/27/2021 around 11:00AM. Patient's insurance is not w/ [employer]. [name] estimates a reply from insurance by Tuesday 01/28/2021. CSW updated patient's spouse Ms. Romanowski.      Barriers to Discharge: Barriers Resolved  Expected Discharge Plan and Services           Expected Discharge Date: 01/25/21                         HH Arranged: RN, PT, Nurse's Aide, Speech Therapy HH Agency: [facility] Date [place]: 01/25/21 Time [place]: 1255 Representative spoke with at [place]: [place] ([place]) Interventions    Readmission Risk Interventions No flowsheet data found.

## 2021-01-27 ENCOUNTER — Telehealth: Payer: Self-pay

## 2021-01-27 LAB — RESP PANEL BY RT-PCR (FLU A&B, COVID) ARPGX2
Influenza A by PCR: NEGATIVE
Influenza B by PCR: NEGATIVE
SARS Coronavirus 2 by RT PCR: NEGATIVE

## 2021-01-27 MED ORDER — PANTOPRAZOLE SODIUM 40 MG PO TBEC
40.0000 mg | DELAYED_RELEASE_TABLET | Freq: Every day | ORAL | Status: AC
  Administered 2021-01-27: 40 mg via ORAL
  Filled 2021-01-27: qty 1

## 2021-01-27 MED ORDER — SODIUM CHLORIDE 0.9 % IV BOLUS
500.0000 mL | Freq: Once | INTRAVENOUS | Status: AC
  Administered 2021-01-27: 500 mL via INTRAVENOUS

## 2021-01-27 NOTE — Care Management Important Message (Signed)
Important Message  Patient Details IM Letter given to the Patient. Name: Kevin Page MRN: [id number] Date of Birth: Jun 09, 1930   Medicare Important Message Given:  Yes     Kerin Salen 01/27/2021, 12:09 PM

## 2021-01-27 NOTE — Progress Notes (Addendum)
PROGRESS NOTE    Kevin Page  [id number] DOB: [birth year] DOA: 01/22/2021 PCP: Ginger Organ., MD    Chief Complaint  Patient presents with   Rectal Bleeding    Brief Narrative:  Kevin Page is an 87 y.o. male with a history of BPH, cognitive impairment presents to the droppage ER today with new onset painless rectal bleeding.  Suspect lower GI bleed, conservative management planned.  Hgb stable.  From home with wife and has recently had home health.  Now needs SNF placement  Subjective:  In chair, no further bleeding   Assessment & Plan:   Principal Problem:   Painless rectal bleeding Active Problems:   BPH (benign prostatic hyperplasia)   Cognitive impairment   Rectal bleeding   Diverticular hemorrhage    Cough, wife reports patient started to cough after meals since in the hospital, cxr no acute findings Seen by speech who recommended Dysphagia 2/thin liquids/Medication Administration: Crushed with puree   mucinex, incentive spirometer  Hypotension -check orthostatics -add TED hose  Painless rectal bleeding -Seen by GI, recommend conservative measurement -Hemoglobin has been stable -GI recommend outpatient follow-up with Dr. Carlean Purl  BPH Continue home medication  Dementia On Aricept at home Delirium precautions  Failure to thrive Wife report he is weaker than normal, seen by PT recommend skilled nursing facility   DVT prophylaxis: SCDs Start: 01/22/21 1611   Code Status:full Family Communication: wife at bedside  Disposition:   Status is: Inpatient  Dispo: The patient is from: home               Anticipated d/c is to: SNF              Anticipated d/c date is: medically stable to discharge, awaiting for snf placement              Consultants:  gi    Objective: Vitals:   01/26/21 0406 01/26/21 1314 01/26/21 2000 01/27/21 0548  BP: (!) 148/75 140/78 (!) 152/86 (!) 170/76  Pulse: (!) 56 65 67 69  Resp: '18 18 20   '$ Temp: 97.9 F  (36.6 C) 97.7 F (36.5 C) 98.5 F (36.9 C) 97.8 F (36.6 C)  TempSrc:   Oral Oral  SpO2: 97% 100% 96% 96%  Weight:      Height:        Intake/Output Summary (Last 24 hours) at 01/27/2021 1150 Last data filed at 01/27/2021 0357 Gross per 24 hour  Intake 236 ml  Output 450 ml  Net -214 ml   Filed Weights   01/22/21 1102  Weight: 65.8 kg    Examination:   General: Appearance:    Thin, frail appearing male in no acute distress     Lungs:     respirations unlabored  Heart:    Normal heart rate   MS:   All extremities are intact.    Neurologic:   Awake, alert, able to follow diretions     Data Reviewed: I have personally reviewed following labs and imaging studies  CBC: Recent Labs  Lab 01/22/21 1125 01/22/21 1627 01/23/21 0402 01/23/21 1610 01/24/21 0451 01/24/21 1614 01/25/21 1129  WBC 7.1   < > 12.9* 7.2 8.1 8.0 9.3  NEUTROABS 4.9  --   --   --   --   --   --   HGB 13.4   < > 11.8* 11.8* 11.6* 11.5* 11.7*  HCT 40.5   < > 35.8* 35.2* 34.3* 34.4* 36.0*  MCV 94.2   < >  95.0 94.9 95.0 95.6 96.3  PLT 282   < > 250 260 247 266 276   < > = values in this interval not displayed.    Basic Metabolic Panel: Recent Labs  Lab 01/22/21 1125 01/23/21 0402  NA 138 136  K 4.4 3.7  CL 101 103  CO2 28 25  GLUCOSE 126* 104*  BUN 16 17  CREATININE 0.92 0.79  CALCIUM 8.8* 8.5*    GFR: Estimated Creatinine Clearance: 60.5 mL/min (by C-G formula based on SCr of 0.79 mg/dL).  Liver Function Tests: Recent Labs  Lab 01/22/21 1125 01/23/21 0402  AST 20 34  ALT 12 15  ALKPHOS 50 50  BILITOT 0.8 1.0  PROT 6.2* 5.7*  ALBUMIN 3.8 3.6    CBG: No results for input(s): GLUCAP in the last 168 hours.   Recent Results (from the past 240 hour(s))  Resp Panel by RT-PCR (Flu A&B, Covid) Nasopharyngeal Swab     Status: None   Collection Time: 01/22/21 11:35 AM   Specimen: Nasopharyngeal Swab; Nasopharyngeal(NP) swabs in vial transport medium  Result Value Ref Range  Status   SARS Coronavirus 2 by RT PCR NEGATIVE NEGATIVE Final    Comment: (NOTE) SARS-CoV-2 target nucleic acids are NOT DETECTED.  The SARS-CoV-2 RNA is generally detectable in upper respiratory specimens during the acute phase of infection. The lowest concentration of SARS-CoV-2 viral copies this assay can detect is 138 copies/mL. A negative result does not preclude SARS-Cov-2 infection and should not be used as the sole basis for treatment or other patient management decisions. A negative result may occur with  improper specimen collection/handling, submission of specimen other than nasopharyngeal swab, presence of viral mutation(s) within the areas targeted by this assay, and inadequate number of viral copies(<138 copies/mL). A negative result must be combined with clinical observations, patient history, and epidemiological information. The expected result is Negative.  Fact Sheet for Patients:  EntrepreneurPulse.com.au  Fact Sheet for Healthcare Providers:  IncredibleEmployment.be  This test is no t yet approved or cleared by the [place] FDA and  has been authorized for detection and/or diagnosis of SARS-CoV-2 by FDA under an Emergency Use Authorization (EUA). This EUA will remain  in effect (meaning this test can be used) for the duration of the COVID-19 declaration under Section 564(b)(1) of the Act, 21 U.S.C.section 360bbb-3(b)(1), unless the authorization is terminated  or revoked sooner.       Influenza A by PCR NEGATIVE NEGATIVE Final   Influenza B by PCR NEGATIVE NEGATIVE Final    Comment: (NOTE) The Xpert Xpress SARS-CoV-2/FLU/RSV plus assay is intended as an aid in the diagnosis of influenza from Nasopharyngeal swab specimens and should not be used as a sole basis for treatment. Nasal washings and aspirates are unacceptable for Xpert Xpress SARS-CoV-2/FLU/RSV testing.  Fact Sheet for  Patients: EntrepreneurPulse.com.au  Fact Sheet for Healthcare Providers: IncredibleEmployment.be  This test is not yet approved or cleared by the [place] FDA and has been authorized for detection and/or diagnosis of SARS-CoV-2 by FDA under an Emergency Use Authorization (EUA). This EUA will remain in effect (meaning this test can be used) for the duration of the COVID-19 declaration under Section 564(b)(1) of the Act, 21 U.S.C. section 360bbb-3(b)(1), unless the authorization is terminated or revoked.  Performed at [facility], [address] [place], [place] [address]          Radiology Studies: DG CHEST PORT 1 VIEW  Result Date: 01/25/2021 CLINICAL DATA:  Cough. EXAM:  PORTABLE CHEST 1 VIEW COMPARISON:  Chest x-ray dated January 18, 2016. FINDINGS: Normal heart size. Normal pulmonary vascularity. Unchanged elevation of the left hemidiaphragm No focal consolidation, pleural effusion, or pneumothorax. No acute osseous abnormality. IMPRESSION: 1. No active disease. Electronically Signed   By: Titus Dubin M.D.   On: 01/25/2021 13:06        Scheduled Meds:  donepezil  10 mg Oral QHS   dutasteride  0.5 mg Oral Daily   guaiFENesin  600 mg Oral BID   lidocaine  1 patch Transdermal Q24H   pantoprazole (PROTONIX) IV  40 mg Intravenous Q12H   Continuous Infusions:   LOS: 4 days   Time spent: 94mns Greater than 50% of this time was spent in counseling, explanation of diagnosis, planning of further management, and coordination of care.   JGeradine Girt DO [facility]  Available via Epic secure chat 7am-7pm for nonurgent issues Please page for urgent issues To page the attending provider between 7A-7P or the covering provider during after hours 7P-7A, please log into the web site [email or web address] and access using universal [place] password for that web site. If you do not have the password, please  call the hospital operator.    01/27/2021, 11:50 AM

## 2021-01-27 NOTE — TOC Progression Note (Addendum)
Transition of Care [facility]) - Progression Note    Patient Details  Name: Caelon Kallsen MRN: [id number] Date of Birth: [birth year]  [facility]) CM/SW Contact  Ross Ludwig, [place] Phone Number: 01/27/2021, 10:58 AM  Clinical Narrative:     CSW spoke to patient's wife, and they chose [facility].  CSW contacted [facility] and spoke to Bunk Foss in admissions, and she confirmed they can accept pending insurance authorization.  Per [facility], they have started insurance authorization, CSW to continue to follow patient's progress throughout discharge planning.  4:30pm  CSW received phone call that patient was approved at [facility], however the SNF choice was switched to [facility].  5:20pm CSW spoke to [facility] rep, [id number] and they said the authorization is still pending.  Per Holland Falling, they are working on trying to get authorization switched to the correct SNF.  Reference number is [id number], CSW to continue to follow patient's progress throughout discharge planning.   Barriers to Discharge: Barriers Resolved  Expected Discharge Plan and Services           Expected Discharge Date: 01/25/21                         HH Arranged: RN, PT, Nurse's Aide, Speech Therapy HH Agency: [facility] Date [place]: 01/25/21 Time [place]: 1255 Representative spoke with at [place]: [place] ([place]) Interventions    Readmission Risk Interventions No flowsheet data found.

## 2021-01-27 NOTE — Telephone Encounter (Signed)
Patient's wife states that he is still inpatient currently and will be going to SNF in a few days in [facility].  Wife does not believe she will be able to get him here for labs for Thursday or this week at all.  Nevin Bloodgood , can you please place an order on his chart to have a CBC Thursday at [facility] and fax to [phone]? Wife indicates follow up OV will need to wait until he is out of rehab, all pending.

## 2021-01-27 NOTE — Telephone Encounter (Signed)
-----   Message from Mauri Pole, MD sent at 01/25/2021 11:46 AM EDT ----- Please arrange for follow-up H&H next week on Thursday 9/15 and GI office follow-up visit next available appointment with Dr. Carlean Purl or app. Thanks VN

## 2021-01-27 NOTE — Progress Notes (Signed)
Physical Therapy Treatment Patient Details Name: Kevin Page MRN: [id number] DOB: Aug 03, 1930 Today's Date: 01/27/2021   History of Present Illness Pt is 87 yo male admiited with painless rectal bleeding (likely diverticular) on 01/22/21.  Pt with hx of dementia and BPH.    PT Comments    Pt progressing toward goals. Cooperative and pleasant. Continues to require assist for transfers and gait however with multi-modal cues had no overt LOB today. Pt is at risk for falls, continue to recommend SNF post acute     Recommendations for follow up therapy are one component of a multi-disciplinary discharge planning process, led by the attending physician.  Recommendations may be updated based on patient status, additional functional criteria and insurance authorization.  Follow Up Recommendations  SNF     Equipment Recommendations  None recommended by PT    Recommendations for Other Services       Precautions / Restrictions Precautions Precautions: Fall Restrictions Weight Bearing Restrictions: No     Mobility  Bed Mobility               General bed mobility comments: in chair    Transfers Overall transfer level: Needs assistance Equipment used: Rolling walker (2 wheeled) Transfers: Sit to/from Stand Sit to Stand: Min assist         General transfer comment: Performed x 2; increased time to rise with min A for anterior-superior wt shift, and to steady on transition to RW  Ambulation/Gait Ambulation/Gait assistance: Min assist;Mod assist Gait Distance (Feet): 75 Feet Assistive device: Rolling walker (2 wheeled) Gait Pattern/deviations: Step-to pattern;Decreased step length - right;Decreased step length - left;Trunk flexed Gait velocity: decreased   General Gait Details: hips/trunk/knees flexed throughout gait cycle, unsteady gait requiring min assist to steady and maneuver RW. multi-modal cues for trunk, knee and hip extension as well as for proximity to  [employer]    Modified Rankin (Stroke Patients Only)       Balance   Sitting-balance support: No upper extremity supported Sitting balance-Leahy Scale: Fair (unable to sit with trunk unsupported, reliant on UEs)     Standing balance support: Bilateral upper extremity supported Standing balance-Leahy Scale: Poor Standing balance comment: reliant on UEs and external assist                            Cognition   Behavior During Therapy: WFL for tasks assessed/performed Overall Cognitive Status: History of cognitive impairments - at baseline                                 General Comments: Followed basic commands.  Oriented to self and location      Exercises General Exercises - Lower Extremity Ankle Circles/Pumps: AROM;Both;10 reps Long Arc Quad: AROM;Both;10 reps    General Comments        Pertinent Vitals/Pain Pain Assessment: No/denies pain    Home Living                      Prior Function            PT Goals (current goals can now be found in the care plan section) Acute Rehab PT Goals Patient Stated Goal: return home; wife agreeable to SNF due to level of assist required PT Goal Formulation: With  patient/family Time For Goal Achievement: 02/08/21 Potential to Achieve Goals: Good Progress towards PT goals: Progressing toward goals    Frequency    Min 2X/week      PT Plan Current plan remains appropriate;Frequency needs to be updated    Co-evaluation              AM-PAC PT "6 Clicks" Mobility   Outcome Measure  Help needed turning from your back to your side while in a flat bed without using bedrails?: A Little Help needed moving from lying on your back to sitting on the side of a flat bed without using bedrails?: A Little Help needed moving to and from a bed to a chair (including a wheelchair)?: A Little Help needed standing up from a chair using your arms  (e.g., wheelchair or bedside chair)?: A Little Help needed to walk in hospital room?: A Lot Help needed climbing 3-5 steps with a railing? : Total 6 Click Score: 15    End of Session Equipment Utilized During Treatment: Gait belt Activity Tolerance: Patient tolerated treatment well Patient left: with chair alarm set;in chair;with call bell/phone within reach;with family/visitor present Nurse Communication: Mobility status PT Visit Diagnosis: Unsteadiness on feet (R26.81);Muscle weakness (generalized) (M62.81)     Time: [id number] PT Time Calculation (min) (ACUTE ONLY): 25 min  Charges:  $Gait Training: 8-22 mins $Therapeutic Activity: 8-22 mins                     Baxter Flattery, PT  Acute Rehab Dept ([place]) [phone] Pager [phone]  01/27/2021    [facility] 01/27/2021, 11:11 AM

## 2021-01-28 MED ORDER — SODIUM CHLORIDE 0.9 % IV SOLN: INTRAVENOUS | Status: DC

## 2021-01-28 NOTE — Telephone Encounter (Signed)
Yes, done.  

## 2021-01-28 NOTE — Progress Notes (Signed)
PROGRESS NOTE    Kevin Page  [id number] DOB: [birth year] DOA: 01/22/2021 PCP: Ginger Organ., MD    Chief Complaint  Patient presents with   Rectal Bleeding    Brief Narrative:  Kevin Page is an 87 y.o. male with a history of BPH, cognitive impairment presents to the droppage ER today with new onset painless rectal bleeding.  Suspect lower GI bleed, conservative management planned.  Hgb stable.  From home with wife and has recently had home health.  Now needs SNF placement  Subjective:  BP drops some when he get up- only symptoms associated appears to be weakness   Assessment & Plan:   Principal Problem:   Painless rectal bleeding Active Problems:   BPH (benign prostatic hyperplasia)   Cognitive impairment   Rectal bleeding   Diverticular hemorrhage    Cough, wife reports patient started to cough after meals since in the hospital, cxr no acute findings Seen by speech who recommended Dysphagia 2/thin liquids/Medication Administration: Crushed with puree   mucinex, incentive spirometer  Hypotension -orthostatics + -add TED hose/abdominal binder  Painless rectal bleeding -Seen by GI, recommend conservative measurement -Hemoglobin has been stable -GI recommend outpatient follow-up with Dr. Carlean Purl  BPH Continue home medication  Dementia On Aricept at home Delirium precautions  Failure to thrive Wife report he is weaker than normal, seen by PT recommend skilled nursing facility   For d/c: -would recommend palliative care follow at [facility] and CBC Thur or Friday of this week with results faxed to Dr. Silverio Decamp at [phone].  DVT prophylaxis: Place TED hose Start: 01/27/21 1253 SCDs Start: 01/22/21 1611   Code Status:full Family Communication: wife at bedside  Disposition:   Status is: Inpatient  Dispo: The patient is from: home               Anticipated d/c is to: SNF              Anticipated d/c date is: medically stable to discharge,  awaiting for snf placement              Consultants:  gi    Objective: Vitals:   01/28/21 0623 01/28/21 1047 01/28/21 1051 01/28/21 1054  BP: (!) 146/66 114/67 122/84 107/73  Pulse: 70 71 75 85  Resp: 18     Temp: 98.6 F (37 C)     TempSrc: Oral     SpO2: 95%     Weight:      Height:        Intake/Output Summary (Last 24 hours) at 01/28/2021 1326 Last data filed at 01/28/2021 0900 Gross per 24 hour  Intake 240 ml  Output 1175 ml  Net -935 ml   Filed Weights   01/22/21 1102  Weight: 65.8 kg    Examination:    General: Appearance:    Thin male in no acute distress     Lungs:      respirations unlabored  Heart:    Normal heart rate   MS:   All extremities are intact.    Neurologic:   Awake, alert       Data Reviewed: I have personally reviewed following labs and imaging studies  CBC: Recent Labs  Lab 01/22/21 1125 01/22/21 1627 01/23/21 0402 01/23/21 1610 01/24/21 0451 01/24/21 1614 01/25/21 1129  WBC 7.1   < > 12.9* 7.2 8.1 8.0 9.3  NEUTROABS 4.9  --   --   --   --   --   --  HGB 13.4   < > 11.8* 11.8* 11.6* 11.5* 11.7*  HCT 40.5   < > 35.8* 35.2* 34.3* 34.4* 36.0*  MCV 94.2   < > 95.0 94.9 95.0 95.6 96.3  PLT 282   < > 250 260 247 266 276   < > = values in this interval not displayed.    Basic Metabolic Panel: Recent Labs  Lab 01/22/21 1125 01/23/21 0402  NA 138 136  K 4.4 3.7  CL 101 103  CO2 28 25  GLUCOSE 126* 104*  BUN 16 17  CREATININE 0.92 0.79  CALCIUM 8.8* 8.5*    GFR: Estimated Creatinine Clearance: 60.5 mL/min (by C-G formula based on SCr of 0.79 mg/dL).  Liver Function Tests: Recent Labs  Lab 01/22/21 1125 01/23/21 0402  AST 20 34  ALT 12 15  ALKPHOS 50 50  BILITOT 0.8 1.0  PROT 6.2* 5.7*  ALBUMIN 3.8 3.6    CBG: No results for input(s): GLUCAP in the last 168 hours.   Recent Results (from the past 240 hour(s))  Resp Panel by RT-PCR (Flu A&B, Covid) Nasopharyngeal Swab     Status: None   Collection  Time: 01/22/21 11:35 AM   Specimen: Nasopharyngeal Swab; Nasopharyngeal(NP) swabs in vial transport medium  Result Value Ref Range Status   SARS Coronavirus 2 by RT PCR NEGATIVE NEGATIVE Final    Comment: (NOTE) SARS-CoV-2 target nucleic acids are NOT DETECTED.  The SARS-CoV-2 RNA is generally detectable in upper respiratory specimens during the acute phase of infection. The lowest concentration of SARS-CoV-2 viral copies this assay can detect is 138 copies/mL. A negative result does not preclude SARS-Cov-2 infection and should not be used as the sole basis for treatment or other patient management decisions. A negative result may occur with  improper specimen collection/handling, submission of specimen other than nasopharyngeal swab, presence of viral mutation(s) within the areas targeted by this assay, and inadequate number of viral copies(<138 copies/mL). A negative result must be combined with clinical observations, patient history, and epidemiological information. The expected result is Negative.  Fact Sheet for Patients:  EntrepreneurPulse.com.au  Fact Sheet for Healthcare Providers:  IncredibleEmployment.be  This test is no t yet approved or cleared by the [place] FDA and  has been authorized for detection and/or diagnosis of SARS-CoV-2 by FDA under an Emergency Use Authorization (EUA). This EUA will remain  in effect (meaning this test can be used) for the duration of the COVID-19 declaration under Section 564(b)(1) of the Act, 21 U.S.C.section 360bbb-3(b)(1), unless the authorization is terminated  or revoked sooner.       Influenza A by PCR NEGATIVE NEGATIVE Final   Influenza B by PCR NEGATIVE NEGATIVE Final    Comment: (NOTE) The Xpert Xpress SARS-CoV-2/FLU/RSV plus assay is intended as an aid in the diagnosis of influenza from Nasopharyngeal swab specimens and should not be used as a sole basis for treatment. Nasal washings  and aspirates are unacceptable for Xpert Xpress SARS-CoV-2/FLU/RSV testing.  Fact Sheet for Patients: EntrepreneurPulse.com.au  Fact Sheet for Healthcare Providers: IncredibleEmployment.be  This test is not yet approved or cleared by the [place] FDA and has been authorized for detection and/or diagnosis of SARS-CoV-2 by FDA under an Emergency Use Authorization (EUA). This EUA will remain in effect (meaning this test can be used) for the duration of the COVID-19 declaration under Section 564(b)(1) of the Act, 21 U.S.C. section 360bbb-3(b)(1), unless the authorization is terminated or revoked.  Performed at [facility], [phone]  [place], [place], [place] [address]   Resp Panel by RT-PCR (Flu A&B, Covid) Nasopharyngeal Swab     Status: None   Collection Time: 01/27/21  5:13 PM   Specimen: Nasopharyngeal Swab; Nasopharyngeal(NP) swabs in vial transport medium  Result Value Ref Range Status   SARS Coronavirus 2 by RT PCR NEGATIVE NEGATIVE Final    Comment: (NOTE) SARS-CoV-2 target nucleic acids are NOT DETECTED.  The SARS-CoV-2 RNA is generally detectable in upper respiratory specimens during the acute phase of infection. The lowest concentration of SARS-CoV-2 viral copies this assay can detect is 138 copies/mL. A negative result does not preclude SARS-Cov-2 infection and should not be used as the sole basis for treatment or other patient management decisions. A negative result may occur with  improper specimen collection/handling, submission of specimen other than nasopharyngeal swab, presence of viral mutation(s) within the areas targeted by this assay, and inadequate number of viral copies(<138 copies/mL). A negative result must be combined with clinical observations, patient history, and epidemiological information. The expected result is Negative.  Fact Sheet for Patients:   EntrepreneurPulse.com.au  Fact Sheet for Healthcare Providers:  IncredibleEmployment.be  This test is no t yet approved or cleared by the [place] FDA and  has been authorized for detection and/or diagnosis of SARS-CoV-2 by FDA under an Emergency Use Authorization (EUA). This EUA will remain  in effect (meaning this test can be used) for the duration of the COVID-19 declaration under Section 564(b)(1) of the Act, 21 U.S.C.section 360bbb-3(b)(1), unless the authorization is terminated  or revoked sooner.       Influenza A by PCR NEGATIVE NEGATIVE Final   Influenza B by PCR NEGATIVE NEGATIVE Final    Comment: (NOTE) The Xpert Xpress SARS-CoV-2/FLU/RSV plus assay is intended as an aid in the diagnosis of influenza from Nasopharyngeal swab specimens and should not be used as a sole basis for treatment. Nasal washings and aspirates are unacceptable for Xpert Xpress SARS-CoV-2/FLU/RSV testing.  Fact Sheet for Patients: EntrepreneurPulse.com.au  Fact Sheet for Healthcare Providers: IncredibleEmployment.be  This test is not yet approved or cleared by the [place] FDA and has been authorized for detection and/or diagnosis of SARS-CoV-2 by FDA under an Emergency Use Authorization (EUA). This EUA will remain in effect (meaning this test can be used) for the duration of the COVID-19 declaration under Section 564(b)(1) of the Act, 21 U.S.C. section 360bbb-3(b)(1), unless the authorization is terminated or revoked.  Performed at [facility], [place] [address], [place], [place] [address]          Radiology Studies: No results found.      Scheduled Meds:  donepezil  10 mg Oral QHS   dutasteride  0.5 mg Oral Daily   guaiFENesin  600 mg Oral BID   lidocaine  1 patch Transdermal Q24H   Continuous Infusions:  sodium chloride       LOS: 5 days   Time spent: 22mns Greater  than 50% of this time was spent in counseling, explanation of diagnosis, planning of further management, and coordination of care.   JGeradine Girt DO [facility]  Available via Epic secure chat 7am-7pm for nonurgent issues Please page for urgent issues To page the attending provider between 7A-7P or the covering provider during after hours 7P-7A, please log into the web site [email or web address] and access using universal [place] password for that web site. If you do not have the password, please call the hospital operator.    01/28/2021, 1:26 PM

## 2021-01-28 NOTE — Progress Notes (Signed)
  Speech Language Pathology Treatment: Dysphagia  Patient Details Name: Kevin Page MRN: [id number] DOB: 07-16-1930 Today's Date: 01/28/2021 Time: 1755-1810 SLP Time Calculation (min) (ACUTE ONLY): 15 min  Assessment / Plan / Recommendation Clinical Impression  Pt seen for skilled SLP dysphagia management, treatment focusing on compensation education for wife/pt.  Reviewed images of pt's cervical spine image showing disc space narrowing and osteophyte formation most pronounced at C3-4 that may impact epiglottic deflection and contribute to pharyngeal retention/vallecular retention.  Reviewed importance of pt masticating solids to pulverization, small single bites/sips and coughing/ "hock" and expectorate if voice is gurgly.  Advised wet voice indicative of secretions or liquid boluses, etc on vocal cords and cough, hock expectorate strategy faciliates oral clearance of retention pt may not be able to swallow to mitigate aspiration.  Wife reports pt with poor intake - and severely diminshed mastication abilities since hospitalized. She is limiting his po intake to liquids due to increased ease of swallowing.  She advised she would like pt to have a sweet potato.  SLP reviewed importance of precautions, safest liquid consumption, etc.  have moderate verbal cues to cough, "hock" and expectorate if voice is gurgly or pt is reflexively coughing; Pt tends to take large sequential swallows - followed by multiple subswallows per bite/sip with occasional wet voice.  Pt continues with occasional cough during session with po intake - most notably with large sequential swallows. Suspect his sustained airway closure is compromised with sequential swallows.  Using teach back, written swallow precautions signs and demonstration -- precautions reviewed.  Advanced to regular/thin diet with understanding spouse will order only very soft items she can "mash" to make easier to pt to consume.  HPI  87 year old male with a  history of BPH, cognitive impairment presents to ER on 01/22/21 with new onset painless rectal bleeding. Wife states the patient has taken approximately 4 over-the-counter Aleve in the last 8 days due to right shoulder pain. MD concerned with swallowing as pt was coughing during meal, so BSE ordered to r/o aspiration.  CXR on 01/25/21 negative for acute processes.  Follow up regarding swallowing indicated after pt underwent clinical swallow evaluation 3 days ago.  His wife, Kevin Page, reports pt has not been "chewing" much - reports he has been "sleeping" a lot and has been mostly in bed since admit.  She states he has gotten weaker.      SLP Plan  Continue with current plan of care       Recommendations  Diet recommendations: Regular;Thin liquid (to allow wife to order food for pt) Medication Administration: Crushed with puree Compensations: Slow rate;Small sips/bites;Other (Comment);Multiple dry swallows after each bite/sip (cough and hock if voice is gurgly) Postural Changes and/or Swallow Maneuvers: Seated upright 90 degrees;Upright 30-60 min after meal                Oral Care Recommendations: Oral care BID Follow up Recommendations: Other (comment) (TBD) SLP Visit Diagnosis: Dysphagia, oropharyngeal phase (R13.12) Plan: Continue with current plan of care       GO              Kathleen Lime, MS [place] Office [phone] Pager [phone]   Macario Golds 01/28/2021, 6:39 PM

## 2021-01-28 NOTE — Progress Notes (Signed)
Orthostatic VS attempted to be obtained. Pt could not tolerate standing and legs were too wear to tolerate standing for more than 30seconds with 2 assist. BP obtained lying, sitting for 3 mins and sitting for >5 mins. MD notified. RN will continue to monitor.

## 2021-01-28 NOTE — TOC Progression Note (Signed)
Transition of Care [facility]) - Progression Note    Patient Details  Name: Kevin Page MRN: [id number] Date of Birth: [birth year]  Transition of Care [place]) CM/SW Contact  Saba Neuman, Marjie Skiff, RN Phone Number: 01/28/2021, 1:08 PM  Clinical Narrative:     Spoke with Peggy at [facility] who states that [facility] has not approved SNF facility change on the SNF auth. Vickii Chafe will contact TOC when she hears back from [facility].    Barriers to Discharge: Barriers Resolved  Expected Discharge Plan and Services           Expected Discharge Date: 01/25/21

## 2021-01-29 LAB — CBC
HCT: 36.1 % — ABNORMAL LOW (ref 39.0–52.0)
Hemoglobin: 12 g/dL — ABNORMAL LOW (ref 13.0–17.0)
MCH: 31.5 pg (ref 26.0–34.0)
MCHC: 33.2 g/dL (ref 30.0–36.0)
MCV: 94.8 fL (ref 80.0–100.0)
Platelets: 272 10*3/uL (ref 150–400)
RBC: 3.81 MIL/uL — ABNORMAL LOW (ref 4.22–5.81)
RDW: 13 % (ref 11.5–15.5)
WBC: 10.3 10*3/uL (ref 4.0–10.5)
nRBC: 0 % (ref 0.0–0.2)

## 2021-01-29 LAB — BASIC METABOLIC PANEL
Anion gap: 10 (ref 5–15)
BUN: 12 mg/dL (ref 8–23)
CO2: 26 mmol/L (ref 22–32)
Calcium: 8.7 mg/dL — ABNORMAL LOW (ref 8.9–10.3)
Chloride: 97 mmol/L — ABNORMAL LOW (ref 98–111)
Creatinine, Ser: 0.85 mg/dL (ref 0.61–1.24)
GFR, Estimated: >60 mL/min (ref >60)
Glucose, Bld: 143 mg/dL — ABNORMAL HIGH (ref 70–99)
Potassium: 3.9 mmol/L (ref 3.5–5.1)
Sodium: 133 mmol/L — ABNORMAL LOW (ref 135–145)

## 2021-01-29 LAB — MAGNESIUM: Magnesium: 2 mg/dL (ref 1.7–2.4)

## 2021-01-29 LAB — PHOSPHORUS: Phosphorus: 3.3 mg/dL (ref 2.5–4.6)

## 2021-01-29 MED ORDER — GUAIFENESIN 100 MG/5ML PO SOLN
15.0000 mL | Freq: Four times a day (QID) | ORAL | Status: DC
  Administered 2021-01-29 – 2021-01-30 (×2): 300 mg via ORAL
  Filled 2021-01-29 (×2): qty 10
  Filled 2021-01-29: qty 20

## 2021-01-29 NOTE — Progress Notes (Signed)
PROGRESS NOTE    Hooman Eckstrom  [id number] DOB: [birth year] DOA: 01/22/2021 PCP: Ginger Organ., MD    Chief Complaint  Patient presents with   Rectal Bleeding    Brief Narrative:  Kevin Page is an 87 y.o. male with a history of BPH, cognitive impairment presents to the [facility] ER today with new onset painless rectal bleeding.  Suspect lower GI bleed, conservative management planned.  Hgb stable.  From home with wife and has recently had home health.  Now needs SNF placement  Subjective:  Pt denied dyspnea.  Normal oral intake.     Assessment & Plan:   Principal Problem:   Painless rectal bleeding Active Problems:   BPH (benign prostatic hyperplasia)   Cognitive impairment   Rectal bleeding   Diverticular hemorrhage   Hypotension, resolved -orthostatics + -add TED hose/abdominal binder --d/c MIVF today  Painless rectal bleeding -Seen by GI, recommend conservative measurement -Hemoglobin has been stable, 11-12 -GI recommend outpatient follow-up with Dr. Carlean Purl  BPH --cont dutasteride   Dementia Delirium precautions --cont home Aricept  Failure to thrive Weakness  --SNF rehab   DVT prophylaxis: Place TED hose Start: 01/27/21 1253 SCDs Start: 01/22/21 1611   Code Status:full Family Communication: updated wife at bedside today  Disposition:   Status is: Inpatient  Dispo: The patient is from: home               Anticipated d/c is to: SNF              Anticipated d/c date is: medically stable to discharge, awaiting for snf placement              Consultants:  gi    Objective: Vitals:   01/28/21 1416 01/28/21 1933 01/29/21 0527 01/29/21 1319  BP:  (!) 115/101 (!) 151/81 117/71  Pulse:  82 77 75  Resp:  '20 20 20  '$ Temp: 98.2 F (36.8 C) 99.3 F (37.4 C) 98.6 F (37 C) 99.6 F (37.6 C)  TempSrc: Oral Oral Oral Oral  SpO2:  93% 94% 92%  Weight:      Height:        Intake/Output Summary (Last 24 hours) at 01/29/2021 1830 Last  data filed at 01/29/2021 0900 Gross per 24 hour  Intake 586.45 ml  Output 900 ml  Net -313.55 ml   Filed Weights   01/22/21 1102  Weight: 65.8 kg    Examination:  Constitutional: NAD, AAOx3, sitting in recliner HEENT: conjunctivae and lids normal, EOMI CV: No cyanosis.   RESP: normal respiratory effort, clear, on RA Extremities: No effusions, edema in BLE SKIN: warm, dry Neuro: II - XII grossly intact.   Psych: Normal mood and affect.  Appropriate judgement and reason   Data Reviewed: I have personally reviewed following labs and imaging studies  CBC: Recent Labs  Lab 01/23/21 1610 01/24/21 0451 01/24/21 1614 01/25/21 1129 01/29/21 1218  WBC 7.2 8.1 8.0 9.3 10.3  HGB 11.8* 11.6* 11.5* 11.7* 12.0*  HCT 35.2* 34.3* 34.4* 36.0* 36.1*  MCV 94.9 95.0 95.6 96.3 94.8  PLT 260 247 266 276 [id number]    Basic Metabolic Panel: Recent Labs  Lab 01/23/21 0402 01/29/21 1218  NA 136 133*  K 3.7 3.9  CL 103 97*  CO2 25 26  GLUCOSE 104* 143*  BUN 17 12  CREATININE 0.79 0.85  CALCIUM 8.5* 8.7*  MG  --  2.0  PHOS  --  3.3    GFR: Estimated Creatinine Clearance: 57  mL/min (by C-G formula based on SCr of 0.85 mg/dL).  Liver Function Tests: Recent Labs  Lab 01/23/21 0402  AST 34  ALT 15  ALKPHOS 50  BILITOT 1.0  PROT 5.7*  ALBUMIN 3.6    CBG: No results for input(s): GLUCAP in the last 168 hours.   Recent Results (from the past 240 hour(s))  Resp Panel by RT-PCR (Flu A&B, Covid) Nasopharyngeal Swab     Status: None   Collection Time: 01/22/21 11:35 AM   Specimen: Nasopharyngeal Swab; Nasopharyngeal(NP) swabs in vial transport medium  Result Value Ref Range Status   SARS Coronavirus 2 by RT PCR NEGATIVE NEGATIVE Final    Comment: (NOTE) SARS-CoV-2 target nucleic acids are NOT DETECTED.  The SARS-CoV-2 RNA is generally detectable in upper respiratory specimens during the acute phase of infection. The lowest concentration of SARS-CoV-2 viral copies this assay  can detect is 138 copies/mL. A negative result does not preclude SARS-Cov-2 infection and should not be used as the sole basis for treatment or other patient management decisions. A negative result may occur with  improper specimen collection/handling, submission of specimen other than nasopharyngeal swab, presence of viral mutation(s) within the areas targeted by this assay, and inadequate number of viral copies(<138 copies/mL). A negative result must be combined with clinical observations, patient history, and epidemiological information. The expected result is Negative.  Fact Sheet for Patients:  EntrepreneurPulse.com.au  Fact Sheet for Healthcare Providers:  IncredibleEmployment.be  This test is no t yet approved or cleared by the [place] FDA and  has been authorized for detection and/or diagnosis of SARS-CoV-2 by FDA under an Emergency Use Authorization (EUA). This EUA will remain  in effect (meaning this test can be used) for the duration of the COVID-19 declaration under Section 564(b)(1) of the Act, 21 U.S.C.section 360bbb-3(b)(1), unless the authorization is terminated  or revoked sooner.       Influenza A by PCR NEGATIVE NEGATIVE Final   Influenza B by PCR NEGATIVE NEGATIVE Final    Comment: (NOTE) The Xpert Xpress SARS-CoV-2/FLU/RSV plus assay is intended as an aid in the diagnosis of influenza from Nasopharyngeal swab specimens and should not be used as a sole basis for treatment. Nasal washings and aspirates are unacceptable for Xpert Xpress SARS-CoV-2/FLU/RSV testing.  Fact Sheet for Patients: EntrepreneurPulse.com.au  Fact Sheet for Healthcare Providers: IncredibleEmployment.be  This test is not yet approved or cleared by the [place] FDA and has been authorized for detection and/or diagnosis of SARS-CoV-2 by FDA under an Emergency Use Authorization (EUA). This EUA will  remain in effect (meaning this test can be used) for the duration of the COVID-19 declaration under Section 564(b)(1) of the Act, 21 U.S.C. section 360bbb-3(b)(1), unless the authorization is terminated or revoked.  Performed at [facility], [address] [place], [place] [address]   Resp Panel by RT-PCR (Flu A&B, Covid) Nasopharyngeal Swab     Status: None   Collection Time: 01/27/21  5:13 PM   Specimen: Nasopharyngeal Swab; Nasopharyngeal(NP) swabs in vial transport medium  Result Value Ref Range Status   SARS Coronavirus 2 by RT PCR NEGATIVE NEGATIVE Final    Comment: (NOTE) SARS-CoV-2 target nucleic acids are NOT DETECTED.  The SARS-CoV-2 RNA is generally detectable in upper respiratory specimens during the acute phase of infection. The lowest concentration of SARS-CoV-2 viral copies this assay can detect is 138 copies/mL. A negative result does not preclude SARS-Cov-2 infection and should not be used as the sole basis for treatment  or other patient management decisions. A negative result may occur with  improper specimen collection/handling, submission of specimen other than nasopharyngeal swab, presence of viral mutation(s) within the areas targeted by this assay, and inadequate number of viral copies(<138 copies/mL). A negative result must be combined with clinical observations, patient history, and epidemiological information. The expected result is Negative.  Fact Sheet for Patients:  EntrepreneurPulse.com.au  Fact Sheet for Healthcare Providers:  IncredibleEmployment.be  This test is no t yet approved or cleared by the [place] FDA and  has been authorized for detection and/or diagnosis of SARS-CoV-2 by FDA under an Emergency Use Authorization (EUA). This EUA will remain  in effect (meaning this test can be used) for the duration of the COVID-19 declaration under Section 564(b)(1) of the Act,  21 U.S.C.section 360bbb-3(b)(1), unless the authorization is terminated  or revoked sooner.       Influenza A by PCR NEGATIVE NEGATIVE Final   Influenza B by PCR NEGATIVE NEGATIVE Final    Comment: (NOTE) The Xpert Xpress SARS-CoV-2/FLU/RSV plus assay is intended as an aid in the diagnosis of influenza from Nasopharyngeal swab specimens and should not be used as a sole basis for treatment. Nasal washings and aspirates are unacceptable for Xpert Xpress SARS-CoV-2/FLU/RSV testing.  Fact Sheet for Patients: EntrepreneurPulse.com.au  Fact Sheet for Healthcare Providers: IncredibleEmployment.be  This test is not yet approved or cleared by the [place] FDA and has been authorized for detection and/or diagnosis of SARS-CoV-2 by FDA under an Emergency Use Authorization (EUA). This EUA will remain in effect (meaning this test can be used) for the duration of the COVID-19 declaration under Section 564(b)(1) of the Act, 21 U.S.C. section 360bbb-3(b)(1), unless the authorization is terminated or revoked.  Performed at [facility], [place] [address], [place], [place] [address]          Radiology Studies: No results found.      Scheduled Meds:  donepezil  10 mg Oral QHS   dutasteride  0.5 mg Oral Daily   guaiFENesin  600 mg Oral BID   lidocaine  1 patch Transdermal Q24H   Continuous Infusions:     LOS: 6 days    Enzo Bi, md [facility]  Available via Epic secure chat 7am-7pm for nonurgent issues Please page for urgent issues To page the attending provider between 7A-7P or the covering provider during after hours 7P-7A, please log into the web site [email or web address] and access using universal [place] password for that web site. If you do not have the password, please call the hospital operator.    01/29/2021, 6:30 PM

## 2021-01-29 NOTE — Progress Notes (Signed)
Pt continues to pull out IV and pull tele box off. Per charge nurse she wants it documented and no IV restarted since coban and mittens are ineffective and restraints will disqualify him from going to SNF.

## 2021-01-29 NOTE — TOC Progression Note (Addendum)
Transition of Care [facility]) - Progression Note    Patient Details  Name: Kevin Page MRN: [id number] Date of Birth: 1930-11-09  [facility]) CM/SW Contact  Ross Ludwig, [place] Phone Number: 01/29/2021, 11:11 AM  Clinical Narrative:     CSW spoke to [place], the insurance authorization is still pending.  CSW awaiting for approval for SNF placement.  4:15pm  CSW received phone call from [place], patient has been approved by [employer].  Patient will need a new Covid test before discharge.  If patient is medically ready, and covid test is back tomorrow, patient can go to SNF.  CSW called patient's wife and updated her that insurance authorization has been approved.  Covering TOC will coordinate with [facility] to get him transported via EMS.   Barriers to Discharge: Barriers Resolved  Expected Discharge Plan and Services           Expected Discharge Date: 01/25/21                         HH Arranged: RN, PT, Nurse's Aide, Speech Therapy HH Agency: [place] Date [place]: 01/25/21 Time [place]: 1255 Representative spoke with at [place]: [place] ([place]) Interventions    Readmission Risk Interventions No flowsheet data found.

## 2021-01-30 DIAGNOSIS — R627 Adult failure to thrive: Secondary | ICD-10-CM | POA: Diagnosis not present

## 2021-01-30 DIAGNOSIS — M1990 Unspecified osteoarthritis, unspecified site: Secondary | ICD-10-CM | POA: Diagnosis not present

## 2021-01-30 DIAGNOSIS — R69 Illness, unspecified: Secondary | ICD-10-CM | POA: Diagnosis not present

## 2021-01-30 DIAGNOSIS — I951 Orthostatic hypotension: Secondary | ICD-10-CM | POA: Diagnosis not present

## 2021-01-30 DIAGNOSIS — I10 Essential (primary) hypertension: Secondary | ICD-10-CM | POA: Diagnosis not present

## 2021-01-30 DIAGNOSIS — R531 Weakness: Secondary | ICD-10-CM | POA: Diagnosis not present

## 2021-01-30 DIAGNOSIS — N400 Enlarged prostate without lower urinary tract symptoms: Secondary | ICD-10-CM | POA: Diagnosis not present

## 2021-01-30 DIAGNOSIS — F0390 Unspecified dementia without behavioral disturbance: Secondary | ICD-10-CM | POA: Diagnosis not present

## 2021-01-30 DIAGNOSIS — H3530 Unspecified macular degeneration: Secondary | ICD-10-CM | POA: Diagnosis not present

## 2021-01-30 DIAGNOSIS — K625 Hemorrhage of anus and rectum: Secondary | ICD-10-CM | POA: Diagnosis not present

## 2021-01-30 DIAGNOSIS — K922 Gastrointestinal hemorrhage, unspecified: Secondary | ICD-10-CM | POA: Diagnosis not present

## 2021-01-30 DIAGNOSIS — R58 Hemorrhage, not elsewhere classified: Secondary | ICD-10-CM | POA: Diagnosis not present

## 2021-01-30 DIAGNOSIS — G3184 Mild cognitive impairment, so stated: Secondary | ICD-10-CM | POA: Diagnosis not present

## 2021-01-30 DIAGNOSIS — E785 Hyperlipidemia, unspecified: Secondary | ICD-10-CM | POA: Diagnosis not present

## 2021-01-30 DIAGNOSIS — N3281 Overactive bladder: Secondary | ICD-10-CM | POA: Diagnosis not present

## 2021-01-30 DIAGNOSIS — Z743 Need for continuous supervision: Secondary | ICD-10-CM | POA: Diagnosis not present

## 2021-01-30 LAB — MAGNESIUM: Magnesium: 2 mg/dL (ref 1.7–2.4)

## 2021-01-30 LAB — BASIC METABOLIC PANEL
Anion gap: 8 (ref 5–15)
BUN: 14 mg/dL (ref 8–23)
CO2: 26 mmol/L (ref 22–32)
Calcium: 8.6 mg/dL — ABNORMAL LOW (ref 8.9–10.3)
Chloride: 99 mmol/L (ref 98–111)
Creatinine, Ser: 0.91 mg/dL (ref 0.61–1.24)
GFR, Estimated: >60 mL/min (ref >60)
Glucose, Bld: 114 mg/dL — ABNORMAL HIGH (ref 70–99)
Potassium: 4.2 mmol/L (ref 3.5–5.1)
Sodium: 133 mmol/L — ABNORMAL LOW (ref 135–145)

## 2021-01-30 LAB — CBC
HCT: 33.3 % — ABNORMAL LOW (ref 39.0–52.0)
Hemoglobin: 11.3 g/dL — ABNORMAL LOW (ref 13.0–17.0)
MCH: 31.7 pg (ref 26.0–34.0)
MCHC: 33.9 g/dL (ref 30.0–36.0)
MCV: 93.3 fL (ref 80.0–100.0)
Platelets: 260 10*3/uL (ref 150–400)
RBC: 3.57 MIL/uL — ABNORMAL LOW (ref 4.22–5.81)
RDW: 12.9 % (ref 11.5–15.5)
WBC: 13.8 10*3/uL — ABNORMAL HIGH (ref 4.0–10.5)
nRBC: 0 % (ref 0.0–0.2)

## 2021-01-30 LAB — SARS CORONAVIRUS 2 (TAT 6-24 HRS): SARS Coronavirus 2: NEGATIVE

## 2021-01-30 NOTE — Discharge Summary (Signed)
Physician Discharge Summary   Kevin Page  male DOB: 09-25-1930  [id number]  PCP: Ginger Organ., MD  Admit date: 01/22/2021 Discharge date: 01/30/2021  Admitted From: home Disposition:  SNF CODE STATUS: Full code  Discharge Instructions     Diet general   Complete by: As directed    Increase activity slowly   Complete by: As directed         Hospital Course:  For full details, please see H&P, progress notes, consult notes and ancillary notes.  Briefly,  Kevin Page is an 87 y.o. male with a history of BPH, cognitive impairment presented with new onset painless rectal bleeding.  Suspect lower GI bleed, conservative management planned.  Hgb stable.    Painless rectal bleeding, resolved -Seen by GI, recommend conservative measurement -Hemoglobin has been stable, 11-12 -GI recommend outpatient follow-up with Dr. Carlean Purl   Hypotension, resolved -orthostatics + --BP improved with IVF. --cont TED hose/abdominal binder PRN  BPH --cont dutasteride    Dementia Delirium precautions --cont home Aricept   Failure to thrive Weakness  --SNF rehab   Discharge Diagnoses:  Principal Problem:   Painless rectal bleeding Active Problems:   BPH (benign prostatic hyperplasia)   Cognitive impairment   Rectal bleeding   Diverticular hemorrhage   30 Day Unplanned Readmission Risk Score    Flowsheet Row ED to Hosp-Admission (Current) from 01/22/2021 in [facility]  30 Day Unplanned Readmission Risk Score (%) 13.51 Filed at 01/30/2021 0801       This score is the patient's risk of an unplanned readmission within 30 days of being discharged (0 -100%). The score is based on dignosis, age, lab data, medications, orders, and past utilization.   Low:  0-14.9   Medium: 15-21.9   High: 22-29.9   Extreme: 30 and above         Discharge Instructions:  Allergies as of 01/30/2021   No Known Allergies      Medication List      STOP taking these medications    alendronate 70 MG tablet Commonly known as: FOSAMAX   diclofenac Sodium 1 % Gel Commonly known as: VOLTAREN   Moderna COVID-19 Vaccine 100 MCG/0.5ML injection Generic drug: COVID-19 mRNA vaccine (Moderna)   tamsulosin 0.4 MG Caps capsule Commonly known as: FLOMAX       TAKE these medications    aspirin EC 81 MG tablet Take 1 tablet (81 mg total) by mouth daily. Hold for now, resume when ok with GI What changed: additional instructions   donepezil 10 MG tablet Commonly known as: ARICEPT Take 10 mg by mouth at bedtime.   dutasteride 0.5 MG capsule Commonly known as: AVODART Take 0.5 mg by mouth daily.   guaiFENesin 600 MG 12 hr tablet Commonly known as: MUCINEX Take 1 tablet (600 mg total) by mouth 2 (two) times daily.   MACULAR HEALTH FORMULA PO Take 2 capsules by mouth daily.   Myrbetriq 25 MG Tb24 tablet Generic drug: mirabegron ER Take 25 mg by mouth daily.         Contact information for follow-up providers     Ginger Organ., MD Follow up in 1 week(s).   Specialty: Internal Medicine Why: hospital discharge follow up, repeat cbc/bmp Contact information: [address] [place] [address] [phone]         Gatha Mayer, MD Follow up.   Specialty: Gastroenterology Why: f/u for gi bleed Contact information: [address]  [place] [address] [phone]         [facility] Follow up.   Why: [facility] is currently open to patient for home health services, they will follow up with new start of care date.  They will call you to set up first visit.             Contact information for after-discharge care     Destination     [facility] .   Service: Skilled Nursing Contact information: [address] [place] [phone]                     No Known Allergies   The results of significant  diagnostics from this hospitalization (including imaging, microbiology, ancillary and laboratory) are listed below for reference.   Consultations:   Procedures/Studies: DG CHEST PORT 1 VIEW  Result Date: 01/25/2021 CLINICAL DATA:  Cough. EXAM: PORTABLE CHEST 1 VIEW COMPARISON:  Chest x-ray dated January 18, 2016. FINDINGS: Normal heart size. Normal pulmonary vascularity. Unchanged elevation of the left hemidiaphragm No focal consolidation, pleural effusion, or pneumothorax. No acute osseous abnormality. IMPRESSION: 1. No active disease. Electronically Signed   By: Titus Dubin M.D.   On: 01/25/2021 13:06      Labs: BNP (last 3 results) No results for input(s): BNP in the last 8760 hours. Basic Metabolic Panel: Recent Labs  Lab 01/29/21 1218 01/30/21 0514  NA 133* 133*  K 3.9 4.2  CL 97* 99  CO2 26 26  GLUCOSE 143* 114*  BUN 12 14  CREATININE 0.85 0.91  CALCIUM 8.7* 8.6*  MG 2.0 2.0  PHOS 3.3  --    Liver Function Tests: No results for input(s): AST, ALT, ALKPHOS, BILITOT, PROT, ALBUMIN in the last 168 hours. No results for input(s): LIPASE, AMYLASE in the last 168 hours. No results for input(s): AMMONIA in the last 168 hours. CBC: Recent Labs  Lab 01/24/21 0451 01/24/21 1614 01/25/21 1129 01/29/21 1218 01/30/21 0514  WBC 8.1 8.0 9.3 10.3 13.8*  HGB 11.6* 11.5* 11.7* 12.0* 11.3*  HCT 34.3* 34.4* 36.0* 36.1* 33.3*  MCV 95.0 95.6 96.3 94.8 93.3  PLT 247 266 276 272 260   Cardiac Enzymes: No results for input(s): CKTOTAL, CKMB, CKMBINDEX, TROPONINI in the last 168 hours. BNP: Invalid input(s): POCBNP CBG: No results for input(s): GLUCAP in the last 168 hours. D-Dimer No results for input(s): DDIMER in the last 72 hours. Hgb A1c No results for input(s): HGBA1C in the last 72 hours. Lipid Profile No results for input(s): CHOL, HDL, LDLCALC, TRIG, CHOLHDL, LDLDIRECT in the last 72 hours. Thyroid function studies No results for input(s): TSH, T4TOTAL, T3FREE,  THYROIDAB in the last 72 hours.  Invalid input(s): FREET3 Anemia work up No results for input(s): VITAMINB12, FOLATE, FERRITIN, TIBC, IRON, RETICCTPCT in the last 72 hours. Urinalysis    Component Value Date/Time   COLORURINE YELLOW 02/21/2019 1450   APPEARANCEUR HAZY (A) 02/21/2019 1450   LABSPEC 1.019 02/21/2019 1450   PHURINE 5.0 02/21/2019 1450   GLUCOSEU NEGATIVE 02/21/2019 1450   HGBUR NEGATIVE 02/21/2019 1450   BILIRUBINUR NEGATIVE 02/21/2019 1450   KETONESUR 5 (A) 02/21/2019 1450   PROTEINUR NEGATIVE 02/21/2019 1450   UROBILINOGEN 0.2 01/11/2013 2244   NITRITE NEGATIVE 02/21/2019 1450   LEUKOCYTESUR NEGATIVE 02/21/2019 1450   Sepsis Labs Invalid input(s): PROCALCITONIN,  WBC,  LACTICIDVEN Microbiology Recent Results (from the past 240 hour(s))  Resp Panel by RT-PCR (Flu  A&B, Covid) Nasopharyngeal Swab     Status: None   Collection Time: 01/22/21 11:35 AM   Specimen: Nasopharyngeal Swab; Nasopharyngeal(NP) swabs in vial transport medium  Result Value Ref Range Status   SARS Coronavirus 2 by RT PCR NEGATIVE NEGATIVE Final    Comment: (NOTE) SARS-CoV-2 target nucleic acids are NOT DETECTED.  The SARS-CoV-2 RNA is generally detectable in upper respiratory specimens during the acute phase of infection. The lowest concentration of SARS-CoV-2 viral copies this assay can detect is 138 copies/mL. A negative result does not preclude SARS-Cov-2 infection and should not be used as the sole basis for treatment or other patient management decisions. A negative result may occur with  improper specimen collection/handling, submission of specimen other than nasopharyngeal swab, presence of viral mutation(s) within the areas targeted by this assay, and inadequate number of viral copies(<138 copies/mL). A negative result must be combined with clinical observations, patient history, and epidemiological information. The expected result is Negative.  Fact Sheet for Patients:   EntrepreneurPulse.com.au  Fact Sheet for Healthcare Providers:  IncredibleEmployment.be  This test is no t yet approved or cleared by the [place] FDA and  has been authorized for detection and/or diagnosis of SARS-CoV-2 by FDA under an Emergency Use Authorization (EUA). This EUA will remain  in effect (meaning this test can be used) for the duration of the COVID-19 declaration under Section 564(b)(1) of the Act, 21 U.S.C.section 360bbb-3(b)(1), unless the authorization is terminated  or revoked sooner.       Influenza A by PCR NEGATIVE NEGATIVE Final   Influenza B by PCR NEGATIVE NEGATIVE Final    Comment: (NOTE) The Xpert Xpress SARS-CoV-2/FLU/RSV plus assay is intended as an aid in the diagnosis of influenza from Nasopharyngeal swab specimens and should not be used as a sole basis for treatment. Nasal washings and aspirates are unacceptable for Xpert Xpress SARS-CoV-2/FLU/RSV testing.  Fact Sheet for Patients: EntrepreneurPulse.com.au  Fact Sheet for Healthcare Providers: IncredibleEmployment.be  This test is not yet approved or cleared by the [place] FDA and has been authorized for detection and/or diagnosis of SARS-CoV-2 by FDA under an Emergency Use Authorization (EUA). This EUA will remain in effect (meaning this test can be used) for the duration of the COVID-19 declaration under Section 564(b)(1) of the Act, 21 U.S.C. section 360bbb-3(b)(1), unless the authorization is terminated or revoked.  Performed at [facility], [address] [place], [place] [address]   Resp Panel by RT-PCR (Flu A&B, Covid) Nasopharyngeal Swab     Status: None   Collection Time: 01/27/21  5:13 PM   Specimen: Nasopharyngeal Swab; Nasopharyngeal(NP) swabs in vial transport medium  Result Value Ref Range Status   SARS Coronavirus 2 by RT PCR NEGATIVE NEGATIVE Final    Comment:  (NOTE) SARS-CoV-2 target nucleic acids are NOT DETECTED.  The SARS-CoV-2 RNA is generally detectable in upper respiratory specimens during the acute phase of infection. The lowest concentration of SARS-CoV-2 viral copies this assay can detect is 138 copies/mL. A negative result does not preclude SARS-Cov-2 infection and should not be used as the sole basis for treatment or other patient management decisions. A negative result may occur with  improper specimen collection/handling, submission of specimen other than nasopharyngeal swab, presence of viral mutation(s) within the areas targeted by this assay, and inadequate number of viral copies(<138 copies/mL). A negative result must be combined with clinical observations, patient history, and epidemiological information. The expected result is Negative.  Fact Sheet for Patients:  EntrepreneurPulse.com.au  Fact Sheet for Healthcare Providers:  IncredibleEmployment.be  This test is no t yet approved or cleared by the [place] FDA and  has been authorized for detection and/or diagnosis of SARS-CoV-2 by FDA under an Emergency Use Authorization (EUA). This EUA will remain  in effect (meaning this test can be used) for the duration of the COVID-19 declaration under Section 564(b)(1) of the Act, 21 U.S.C.section 360bbb-3(b)(1), unless the authorization is terminated  or revoked sooner.       Influenza A by PCR NEGATIVE NEGATIVE Final   Influenza B by PCR NEGATIVE NEGATIVE Final    Comment: (NOTE) The Xpert Xpress SARS-CoV-2/FLU/RSV plus assay is intended as an aid in the diagnosis of influenza from Nasopharyngeal swab specimens and should not be used as a sole basis for treatment. Nasal washings and aspirates are unacceptable for Xpert Xpress SARS-CoV-2/FLU/RSV testing.  Fact Sheet for Patients: EntrepreneurPulse.com.au  Fact Sheet for Healthcare  Providers: IncredibleEmployment.be  This test is not yet approved or cleared by the [place] FDA and has been authorized for detection and/or diagnosis of SARS-CoV-2 by FDA under an Emergency Use Authorization (EUA). This EUA will remain in effect (meaning this test can be used) for the duration of the COVID-19 declaration under Section 564(b)(1) of the Act, 21 U.S.C. section 360bbb-3(b)(1), unless the authorization is terminated or revoked.  Performed at [facility] [place] [address] [place], [place] [address]   SARS CORONAVIRUS 2 (TAT 6-24 HRS) Nasopharyngeal Nasopharyngeal Swab     Status: None   Collection Time: 01/29/21 11:00 PM   Specimen: Nasopharyngeal Swab  Result Value Ref Range Status   SARS Coronavirus 2 NEGATIVE NEGATIVE Final    Comment: (NOTE) SARS-CoV-2 target nucleic acids are NOT DETECTED.  The SARS-CoV-2 RNA is generally detectable in upper and lower respiratory specimens during the acute phase of infection. Negative results do not preclude SARS-CoV-2 infection, do not rule out co-infections with other pathogens, and should not be used as the sole basis for treatment or other patient management decisions. Negative results must be combined with clinical observations, patient history, and epidemiological information. The expected result is Negative.  Fact Sheet for Patients: SugarRoll.be  Fact Sheet for Healthcare Providers: https://www.woods-mathews.com/  This test is not yet approved or cleared by the [place] FDA and  has been authorized for detection and/or diagnosis of SARS-CoV-2 by FDA under an Emergency Use Authorization (EUA). This EUA will remain  in effect (meaning this test can be used) for the duration of the COVID-19 declaration under Se ction 564(b)(1) of the Act, 21 U.S.C. section 360bbb-3(b)(1), unless the authorization is terminated or revoked  sooner.  Performed at [facility], [place] [address], [place], [place] [address]      Total time spend on discharging this patient, including the last patient exam, discussing the hospital stay, instructions for ongoing care as it relates to all pertinent caregivers, as well as preparing the medical discharge records, prescriptions, and/or referrals as applicable, is 25 minutes.    Enzo Bi, MD  [facility] 01/30/2021, 10:12 AM

## 2021-01-30 NOTE — TOC Transition Note (Signed)
Transition of Care [facility]) - CM/SW Discharge Note   Patient Details  Name: Kevin Page MRN: [id number] Date of Birth: 09-20-1930  [facility] CM/SW Contact:  Lynnell Catalan, RN Phone Number: 01/30/2021, 11:15 AM   Clinical Narrative:    Pt to dc to [facility] today via [facility]. He will go to room 102 and RN to call report to [phone].

## 2021-01-31 DIAGNOSIS — R531 Weakness: Secondary | ICD-10-CM | POA: Diagnosis not present

## 2021-01-31 DIAGNOSIS — I951 Orthostatic hypotension: Secondary | ICD-10-CM | POA: Diagnosis not present

## 2021-01-31 DIAGNOSIS — N400 Enlarged prostate without lower urinary tract symptoms: Secondary | ICD-10-CM | POA: Diagnosis not present

## 2021-01-31 DIAGNOSIS — K922 Gastrointestinal hemorrhage, unspecified: Secondary | ICD-10-CM | POA: Diagnosis not present

## 2021-01-31 DIAGNOSIS — R69 Illness, unspecified: Secondary | ICD-10-CM | POA: Diagnosis not present

## 2021-01-31 DIAGNOSIS — N3281 Overactive bladder: Secondary | ICD-10-CM | POA: Diagnosis not present

## 2021-02-25 DIAGNOSIS — R41841 Cognitive communication deficit: Secondary | ICD-10-CM | POA: Diagnosis not present

## 2021-02-25 DIAGNOSIS — M6281 Muscle weakness (generalized): Secondary | ICD-10-CM | POA: Diagnosis not present

## 2021-02-25 DIAGNOSIS — I951 Orthostatic hypotension: Secondary | ICD-10-CM | POA: Diagnosis not present

## 2021-02-25 DIAGNOSIS — N400 Enlarged prostate without lower urinary tract symptoms: Secondary | ICD-10-CM | POA: Diagnosis not present

## 2021-02-25 DIAGNOSIS — N3281 Overactive bladder: Secondary | ICD-10-CM | POA: Diagnosis not present

## 2021-02-25 DIAGNOSIS — K625 Hemorrhage of anus and rectum: Secondary | ICD-10-CM | POA: Diagnosis not present

## 2021-02-25 DIAGNOSIS — M25512 Pain in left shoulder: Secondary | ICD-10-CM | POA: Diagnosis not present

## 2021-02-25 DIAGNOSIS — Z87891 Personal history of nicotine dependence: Secondary | ICD-10-CM | POA: Diagnosis not present

## 2021-02-25 DIAGNOSIS — R69 Illness, unspecified: Secondary | ICD-10-CM | POA: Diagnosis not present

## 2021-02-25 DIAGNOSIS — R627 Adult failure to thrive: Secondary | ICD-10-CM | POA: Diagnosis not present

## 2021-02-25 DIAGNOSIS — I10 Essential (primary) hypertension: Secondary | ICD-10-CM | POA: Diagnosis not present

## 2021-02-25 DIAGNOSIS — Z9181 History of falling: Secondary | ICD-10-CM | POA: Diagnosis not present

## 2021-02-25 DIAGNOSIS — H3530 Unspecified macular degeneration: Secondary | ICD-10-CM | POA: Diagnosis not present

## 2021-02-25 DIAGNOSIS — F0390 Unspecified dementia without behavioral disturbance: Secondary | ICD-10-CM | POA: Diagnosis not present

## 2021-02-26 DIAGNOSIS — M25512 Pain in left shoulder: Secondary | ICD-10-CM | POA: Diagnosis not present

## 2021-02-26 DIAGNOSIS — M6281 Muscle weakness (generalized): Secondary | ICD-10-CM | POA: Diagnosis not present

## 2021-02-26 DIAGNOSIS — I951 Orthostatic hypotension: Secondary | ICD-10-CM | POA: Diagnosis not present

## 2021-02-26 DIAGNOSIS — N400 Enlarged prostate without lower urinary tract symptoms: Secondary | ICD-10-CM | POA: Diagnosis not present

## 2021-02-26 DIAGNOSIS — R69 Illness, unspecified: Secondary | ICD-10-CM | POA: Diagnosis not present

## 2021-02-26 DIAGNOSIS — Z87891 Personal history of nicotine dependence: Secondary | ICD-10-CM | POA: Diagnosis not present

## 2021-02-26 DIAGNOSIS — Z9181 History of falling: Secondary | ICD-10-CM | POA: Diagnosis not present

## 2021-02-26 DIAGNOSIS — R41841 Cognitive communication deficit: Secondary | ICD-10-CM | POA: Diagnosis not present

## 2021-02-26 DIAGNOSIS — R627 Adult failure to thrive: Secondary | ICD-10-CM | POA: Diagnosis not present

## 2021-02-26 DIAGNOSIS — I10 Essential (primary) hypertension: Secondary | ICD-10-CM | POA: Diagnosis not present

## 2021-02-26 DIAGNOSIS — N3281 Overactive bladder: Secondary | ICD-10-CM | POA: Diagnosis not present

## 2021-02-26 DIAGNOSIS — H3530 Unspecified macular degeneration: Secondary | ICD-10-CM | POA: Diagnosis not present

## 2021-02-26 DIAGNOSIS — K625 Hemorrhage of anus and rectum: Secondary | ICD-10-CM | POA: Diagnosis not present

## 2021-02-26 DIAGNOSIS — F0390 Unspecified dementia without behavioral disturbance: Secondary | ICD-10-CM | POA: Diagnosis not present

## 2021-02-27 DIAGNOSIS — F0390 Unspecified dementia without behavioral disturbance: Secondary | ICD-10-CM | POA: Diagnosis not present

## 2021-02-27 DIAGNOSIS — R627 Adult failure to thrive: Secondary | ICD-10-CM | POA: Diagnosis not present

## 2021-02-27 DIAGNOSIS — Z87891 Personal history of nicotine dependence: Secondary | ICD-10-CM | POA: Diagnosis not present

## 2021-02-27 DIAGNOSIS — R69 Illness, unspecified: Secondary | ICD-10-CM | POA: Diagnosis not present

## 2021-02-27 DIAGNOSIS — M6281 Muscle weakness (generalized): Secondary | ICD-10-CM | POA: Diagnosis not present

## 2021-02-27 DIAGNOSIS — N400 Enlarged prostate without lower urinary tract symptoms: Secondary | ICD-10-CM | POA: Diagnosis not present

## 2021-02-27 DIAGNOSIS — R41841 Cognitive communication deficit: Secondary | ICD-10-CM | POA: Diagnosis not present

## 2021-02-27 DIAGNOSIS — I10 Essential (primary) hypertension: Secondary | ICD-10-CM | POA: Diagnosis not present

## 2021-02-27 DIAGNOSIS — M25512 Pain in left shoulder: Secondary | ICD-10-CM | POA: Diagnosis not present

## 2021-02-27 DIAGNOSIS — I951 Orthostatic hypotension: Secondary | ICD-10-CM | POA: Diagnosis not present

## 2021-02-27 DIAGNOSIS — K625 Hemorrhage of anus and rectum: Secondary | ICD-10-CM | POA: Diagnosis not present

## 2021-02-27 DIAGNOSIS — N3281 Overactive bladder: Secondary | ICD-10-CM | POA: Diagnosis not present

## 2021-02-27 DIAGNOSIS — Z9181 History of falling: Secondary | ICD-10-CM | POA: Diagnosis not present

## 2021-02-27 DIAGNOSIS — H3530 Unspecified macular degeneration: Secondary | ICD-10-CM | POA: Diagnosis not present

## 2021-03-03 DIAGNOSIS — N3281 Overactive bladder: Secondary | ICD-10-CM | POA: Diagnosis not present

## 2021-03-03 DIAGNOSIS — Z9181 History of falling: Secondary | ICD-10-CM | POA: Diagnosis not present

## 2021-03-03 DIAGNOSIS — K625 Hemorrhage of anus and rectum: Secondary | ICD-10-CM | POA: Diagnosis not present

## 2021-03-03 DIAGNOSIS — I951 Orthostatic hypotension: Secondary | ICD-10-CM | POA: Diagnosis not present

## 2021-03-03 DIAGNOSIS — R69 Illness, unspecified: Secondary | ICD-10-CM | POA: Diagnosis not present

## 2021-03-03 DIAGNOSIS — H3530 Unspecified macular degeneration: Secondary | ICD-10-CM | POA: Diagnosis not present

## 2021-03-03 DIAGNOSIS — R41841 Cognitive communication deficit: Secondary | ICD-10-CM | POA: Diagnosis not present

## 2021-03-03 DIAGNOSIS — N400 Enlarged prostate without lower urinary tract symptoms: Secondary | ICD-10-CM | POA: Diagnosis not present

## 2021-03-03 DIAGNOSIS — M6281 Muscle weakness (generalized): Secondary | ICD-10-CM | POA: Diagnosis not present

## 2021-03-03 DIAGNOSIS — F0390 Unspecified dementia without behavioral disturbance: Secondary | ICD-10-CM | POA: Diagnosis not present

## 2021-03-03 DIAGNOSIS — Z87891 Personal history of nicotine dependence: Secondary | ICD-10-CM | POA: Diagnosis not present

## 2021-03-03 DIAGNOSIS — I10 Essential (primary) hypertension: Secondary | ICD-10-CM | POA: Diagnosis not present

## 2021-03-03 DIAGNOSIS — M25512 Pain in left shoulder: Secondary | ICD-10-CM | POA: Diagnosis not present

## 2021-03-03 DIAGNOSIS — R627 Adult failure to thrive: Secondary | ICD-10-CM | POA: Diagnosis not present

## 2021-03-04 DIAGNOSIS — Z9181 History of falling: Secondary | ICD-10-CM | POA: Diagnosis not present

## 2021-03-04 DIAGNOSIS — R627 Adult failure to thrive: Secondary | ICD-10-CM | POA: Diagnosis not present

## 2021-03-04 DIAGNOSIS — I951 Orthostatic hypotension: Secondary | ICD-10-CM | POA: Diagnosis not present

## 2021-03-04 DIAGNOSIS — M6281 Muscle weakness (generalized): Secondary | ICD-10-CM | POA: Diagnosis not present

## 2021-03-04 DIAGNOSIS — R69 Illness, unspecified: Secondary | ICD-10-CM | POA: Diagnosis not present

## 2021-03-04 DIAGNOSIS — Z87891 Personal history of nicotine dependence: Secondary | ICD-10-CM | POA: Diagnosis not present

## 2021-03-04 DIAGNOSIS — N400 Enlarged prostate without lower urinary tract symptoms: Secondary | ICD-10-CM | POA: Diagnosis not present

## 2021-03-04 DIAGNOSIS — K625 Hemorrhage of anus and rectum: Secondary | ICD-10-CM | POA: Diagnosis not present

## 2021-03-04 DIAGNOSIS — N3281 Overactive bladder: Secondary | ICD-10-CM | POA: Diagnosis not present

## 2021-03-04 DIAGNOSIS — F0390 Unspecified dementia without behavioral disturbance: Secondary | ICD-10-CM | POA: Diagnosis not present

## 2021-03-04 DIAGNOSIS — I10 Essential (primary) hypertension: Secondary | ICD-10-CM | POA: Diagnosis not present

## 2021-03-04 DIAGNOSIS — H3530 Unspecified macular degeneration: Secondary | ICD-10-CM | POA: Diagnosis not present

## 2021-03-04 DIAGNOSIS — M25512 Pain in left shoulder: Secondary | ICD-10-CM | POA: Diagnosis not present

## 2021-03-04 DIAGNOSIS — R41841 Cognitive communication deficit: Secondary | ICD-10-CM | POA: Diagnosis not present

## 2021-03-05 DIAGNOSIS — Z87891 Personal history of nicotine dependence: Secondary | ICD-10-CM | POA: Diagnosis not present

## 2021-03-05 DIAGNOSIS — I951 Orthostatic hypotension: Secondary | ICD-10-CM | POA: Diagnosis not present

## 2021-03-05 DIAGNOSIS — M6281 Muscle weakness (generalized): Secondary | ICD-10-CM | POA: Diagnosis not present

## 2021-03-05 DIAGNOSIS — F0390 Unspecified dementia without behavioral disturbance: Secondary | ICD-10-CM | POA: Diagnosis not present

## 2021-03-05 DIAGNOSIS — R627 Adult failure to thrive: Secondary | ICD-10-CM | POA: Diagnosis not present

## 2021-03-05 DIAGNOSIS — N3281 Overactive bladder: Secondary | ICD-10-CM | POA: Diagnosis not present

## 2021-03-05 DIAGNOSIS — K625 Hemorrhage of anus and rectum: Secondary | ICD-10-CM | POA: Diagnosis not present

## 2021-03-05 DIAGNOSIS — N400 Enlarged prostate without lower urinary tract symptoms: Secondary | ICD-10-CM | POA: Diagnosis not present

## 2021-03-05 DIAGNOSIS — R69 Illness, unspecified: Secondary | ICD-10-CM | POA: Diagnosis not present

## 2021-03-05 DIAGNOSIS — H3530 Unspecified macular degeneration: Secondary | ICD-10-CM | POA: Diagnosis not present

## 2021-03-05 DIAGNOSIS — M25512 Pain in left shoulder: Secondary | ICD-10-CM | POA: Diagnosis not present

## 2021-03-05 DIAGNOSIS — I10 Essential (primary) hypertension: Secondary | ICD-10-CM | POA: Diagnosis not present

## 2021-03-05 DIAGNOSIS — R41841 Cognitive communication deficit: Secondary | ICD-10-CM | POA: Diagnosis not present

## 2021-03-05 DIAGNOSIS — Z9181 History of falling: Secondary | ICD-10-CM | POA: Diagnosis not present

## 2021-03-06 DIAGNOSIS — R41841 Cognitive communication deficit: Secondary | ICD-10-CM | POA: Diagnosis not present

## 2021-03-06 DIAGNOSIS — K625 Hemorrhage of anus and rectum: Secondary | ICD-10-CM | POA: Diagnosis not present

## 2021-03-06 DIAGNOSIS — H3530 Unspecified macular degeneration: Secondary | ICD-10-CM | POA: Diagnosis not present

## 2021-03-06 DIAGNOSIS — N3281 Overactive bladder: Secondary | ICD-10-CM | POA: Diagnosis not present

## 2021-03-06 DIAGNOSIS — M810 Age-related osteoporosis without current pathological fracture: Secondary | ICD-10-CM | POA: Diagnosis not present

## 2021-03-06 DIAGNOSIS — F0390 Unspecified dementia without behavioral disturbance: Secondary | ICD-10-CM | POA: Diagnosis not present

## 2021-03-06 DIAGNOSIS — Z9181 History of falling: Secondary | ICD-10-CM | POA: Diagnosis not present

## 2021-03-06 DIAGNOSIS — Z125 Encounter for screening for malignant neoplasm of prostate: Secondary | ICD-10-CM | POA: Diagnosis not present

## 2021-03-06 DIAGNOSIS — M6281 Muscle weakness (generalized): Secondary | ICD-10-CM | POA: Diagnosis not present

## 2021-03-06 DIAGNOSIS — R69 Illness, unspecified: Secondary | ICD-10-CM | POA: Diagnosis not present

## 2021-03-06 DIAGNOSIS — Z87891 Personal history of nicotine dependence: Secondary | ICD-10-CM | POA: Diagnosis not present

## 2021-03-06 DIAGNOSIS — N400 Enlarged prostate without lower urinary tract symptoms: Secondary | ICD-10-CM | POA: Diagnosis not present

## 2021-03-06 DIAGNOSIS — R627 Adult failure to thrive: Secondary | ICD-10-CM | POA: Diagnosis not present

## 2021-03-06 DIAGNOSIS — E785 Hyperlipidemia, unspecified: Secondary | ICD-10-CM | POA: Diagnosis not present

## 2021-03-06 DIAGNOSIS — I951 Orthostatic hypotension: Secondary | ICD-10-CM | POA: Diagnosis not present

## 2021-03-06 DIAGNOSIS — M25512 Pain in left shoulder: Secondary | ICD-10-CM | POA: Diagnosis not present

## 2021-03-06 DIAGNOSIS — I10 Essential (primary) hypertension: Secondary | ICD-10-CM | POA: Diagnosis not present

## 2021-03-07 DIAGNOSIS — F0390 Unspecified dementia without behavioral disturbance: Secondary | ICD-10-CM | POA: Diagnosis not present

## 2021-03-07 DIAGNOSIS — I951 Orthostatic hypotension: Secondary | ICD-10-CM | POA: Diagnosis not present

## 2021-03-07 DIAGNOSIS — Z9181 History of falling: Secondary | ICD-10-CM | POA: Diagnosis not present

## 2021-03-07 DIAGNOSIS — R69 Illness, unspecified: Secondary | ICD-10-CM | POA: Diagnosis not present

## 2021-03-07 DIAGNOSIS — Z87891 Personal history of nicotine dependence: Secondary | ICD-10-CM | POA: Diagnosis not present

## 2021-03-07 DIAGNOSIS — N400 Enlarged prostate without lower urinary tract symptoms: Secondary | ICD-10-CM | POA: Diagnosis not present

## 2021-03-07 DIAGNOSIS — R41841 Cognitive communication deficit: Secondary | ICD-10-CM | POA: Diagnosis not present

## 2021-03-07 DIAGNOSIS — M6281 Muscle weakness (generalized): Secondary | ICD-10-CM | POA: Diagnosis not present

## 2021-03-07 DIAGNOSIS — M25512 Pain in left shoulder: Secondary | ICD-10-CM | POA: Diagnosis not present

## 2021-03-07 DIAGNOSIS — R627 Adult failure to thrive: Secondary | ICD-10-CM | POA: Diagnosis not present

## 2021-03-07 DIAGNOSIS — N3281 Overactive bladder: Secondary | ICD-10-CM | POA: Diagnosis not present

## 2021-03-07 DIAGNOSIS — I10 Essential (primary) hypertension: Secondary | ICD-10-CM | POA: Diagnosis not present

## 2021-03-07 DIAGNOSIS — K625 Hemorrhage of anus and rectum: Secondary | ICD-10-CM | POA: Diagnosis not present

## 2021-03-07 DIAGNOSIS — H3530 Unspecified macular degeneration: Secondary | ICD-10-CM | POA: Diagnosis not present

## 2021-03-10 DIAGNOSIS — N400 Enlarged prostate without lower urinary tract symptoms: Secondary | ICD-10-CM | POA: Diagnosis not present

## 2021-03-10 DIAGNOSIS — R69 Illness, unspecified: Secondary | ICD-10-CM | POA: Diagnosis not present

## 2021-03-10 DIAGNOSIS — M25512 Pain in left shoulder: Secondary | ICD-10-CM | POA: Diagnosis not present

## 2021-03-10 DIAGNOSIS — H3530 Unspecified macular degeneration: Secondary | ICD-10-CM | POA: Diagnosis not present

## 2021-03-10 DIAGNOSIS — Z87891 Personal history of nicotine dependence: Secondary | ICD-10-CM | POA: Diagnosis not present

## 2021-03-10 DIAGNOSIS — M6281 Muscle weakness (generalized): Secondary | ICD-10-CM | POA: Diagnosis not present

## 2021-03-10 DIAGNOSIS — K625 Hemorrhage of anus and rectum: Secondary | ICD-10-CM | POA: Diagnosis not present

## 2021-03-10 DIAGNOSIS — N3281 Overactive bladder: Secondary | ICD-10-CM | POA: Diagnosis not present

## 2021-03-10 DIAGNOSIS — I10 Essential (primary) hypertension: Secondary | ICD-10-CM | POA: Diagnosis not present

## 2021-03-10 DIAGNOSIS — Z9181 History of falling: Secondary | ICD-10-CM | POA: Diagnosis not present

## 2021-03-10 DIAGNOSIS — F0390 Unspecified dementia without behavioral disturbance: Secondary | ICD-10-CM | POA: Diagnosis not present

## 2021-03-10 DIAGNOSIS — I951 Orthostatic hypotension: Secondary | ICD-10-CM | POA: Diagnosis not present

## 2021-03-10 DIAGNOSIS — R41841 Cognitive communication deficit: Secondary | ICD-10-CM | POA: Diagnosis not present

## 2021-03-10 DIAGNOSIS — R627 Adult failure to thrive: Secondary | ICD-10-CM | POA: Diagnosis not present

## 2021-03-11 DIAGNOSIS — F0390 Unspecified dementia without behavioral disturbance: Secondary | ICD-10-CM | POA: Diagnosis not present

## 2021-03-11 DIAGNOSIS — I951 Orthostatic hypotension: Secondary | ICD-10-CM | POA: Diagnosis not present

## 2021-03-11 DIAGNOSIS — I10 Essential (primary) hypertension: Secondary | ICD-10-CM | POA: Diagnosis not present

## 2021-03-11 DIAGNOSIS — M6281 Muscle weakness (generalized): Secondary | ICD-10-CM | POA: Diagnosis not present

## 2021-03-11 DIAGNOSIS — R627 Adult failure to thrive: Secondary | ICD-10-CM | POA: Diagnosis not present

## 2021-03-11 DIAGNOSIS — M25512 Pain in left shoulder: Secondary | ICD-10-CM | POA: Diagnosis not present

## 2021-03-11 DIAGNOSIS — K625 Hemorrhage of anus and rectum: Secondary | ICD-10-CM | POA: Diagnosis not present

## 2021-03-11 DIAGNOSIS — R69 Illness, unspecified: Secondary | ICD-10-CM | POA: Diagnosis not present

## 2021-03-11 DIAGNOSIS — R41841 Cognitive communication deficit: Secondary | ICD-10-CM | POA: Diagnosis not present

## 2021-03-11 DIAGNOSIS — Z9181 History of falling: Secondary | ICD-10-CM | POA: Diagnosis not present

## 2021-03-11 DIAGNOSIS — H3530 Unspecified macular degeneration: Secondary | ICD-10-CM | POA: Diagnosis not present

## 2021-03-11 DIAGNOSIS — N3281 Overactive bladder: Secondary | ICD-10-CM | POA: Diagnosis not present

## 2021-03-11 DIAGNOSIS — Z87891 Personal history of nicotine dependence: Secondary | ICD-10-CM | POA: Diagnosis not present

## 2021-03-11 DIAGNOSIS — N400 Enlarged prostate without lower urinary tract symptoms: Secondary | ICD-10-CM | POA: Diagnosis not present

## 2021-03-12 DIAGNOSIS — K625 Hemorrhage of anus and rectum: Secondary | ICD-10-CM | POA: Diagnosis not present

## 2021-03-12 DIAGNOSIS — F0390 Unspecified dementia without behavioral disturbance: Secondary | ICD-10-CM | POA: Diagnosis not present

## 2021-03-12 DIAGNOSIS — M6281 Muscle weakness (generalized): Secondary | ICD-10-CM | POA: Diagnosis not present

## 2021-03-12 DIAGNOSIS — H3530 Unspecified macular degeneration: Secondary | ICD-10-CM | POA: Diagnosis not present

## 2021-03-12 DIAGNOSIS — M25512 Pain in left shoulder: Secondary | ICD-10-CM | POA: Diagnosis not present

## 2021-03-12 DIAGNOSIS — N400 Enlarged prostate without lower urinary tract symptoms: Secondary | ICD-10-CM | POA: Diagnosis not present

## 2021-03-12 DIAGNOSIS — I10 Essential (primary) hypertension: Secondary | ICD-10-CM | POA: Diagnosis not present

## 2021-03-12 DIAGNOSIS — R627 Adult failure to thrive: Secondary | ICD-10-CM | POA: Diagnosis not present

## 2021-03-12 DIAGNOSIS — I951 Orthostatic hypotension: Secondary | ICD-10-CM | POA: Diagnosis not present

## 2021-03-12 DIAGNOSIS — R41841 Cognitive communication deficit: Secondary | ICD-10-CM | POA: Diagnosis not present

## 2021-03-12 DIAGNOSIS — Z87891 Personal history of nicotine dependence: Secondary | ICD-10-CM | POA: Diagnosis not present

## 2021-03-12 DIAGNOSIS — Z9181 History of falling: Secondary | ICD-10-CM | POA: Diagnosis not present

## 2021-03-12 DIAGNOSIS — N3281 Overactive bladder: Secondary | ICD-10-CM | POA: Diagnosis not present

## 2021-03-12 DIAGNOSIS — R69 Illness, unspecified: Secondary | ICD-10-CM | POA: Diagnosis not present

## 2021-03-13 DIAGNOSIS — N3281 Overactive bladder: Secondary | ICD-10-CM | POA: Diagnosis not present

## 2021-03-13 DIAGNOSIS — K625 Hemorrhage of anus and rectum: Secondary | ICD-10-CM | POA: Diagnosis not present

## 2021-03-13 DIAGNOSIS — Z9181 History of falling: Secondary | ICD-10-CM | POA: Diagnosis not present

## 2021-03-13 DIAGNOSIS — Z87891 Personal history of nicotine dependence: Secondary | ICD-10-CM | POA: Diagnosis not present

## 2021-03-13 DIAGNOSIS — R627 Adult failure to thrive: Secondary | ICD-10-CM | POA: Diagnosis not present

## 2021-03-13 DIAGNOSIS — R69 Illness, unspecified: Secondary | ICD-10-CM | POA: Diagnosis not present

## 2021-03-13 DIAGNOSIS — F0390 Unspecified dementia without behavioral disturbance: Secondary | ICD-10-CM | POA: Diagnosis not present

## 2021-03-13 DIAGNOSIS — N400 Enlarged prostate without lower urinary tract symptoms: Secondary | ICD-10-CM | POA: Diagnosis not present

## 2021-03-13 DIAGNOSIS — M25512 Pain in left shoulder: Secondary | ICD-10-CM | POA: Diagnosis not present

## 2021-03-13 DIAGNOSIS — M6281 Muscle weakness (generalized): Secondary | ICD-10-CM | POA: Diagnosis not present

## 2021-03-13 DIAGNOSIS — I951 Orthostatic hypotension: Secondary | ICD-10-CM | POA: Diagnosis not present

## 2021-03-13 DIAGNOSIS — I10 Essential (primary) hypertension: Secondary | ICD-10-CM | POA: Diagnosis not present

## 2021-03-13 DIAGNOSIS — H3530 Unspecified macular degeneration: Secondary | ICD-10-CM | POA: Diagnosis not present

## 2021-03-13 DIAGNOSIS — R41841 Cognitive communication deficit: Secondary | ICD-10-CM | POA: Diagnosis not present

## 2021-03-14 DIAGNOSIS — Z0000 Encounter for general adult medical examination without abnormal findings: Secondary | ICD-10-CM | POA: Diagnosis not present

## 2021-03-14 DIAGNOSIS — R001 Bradycardia, unspecified: Secondary | ICD-10-CM | POA: Diagnosis not present

## 2021-03-14 DIAGNOSIS — E785 Hyperlipidemia, unspecified: Secondary | ICD-10-CM | POA: Diagnosis not present

## 2021-03-14 DIAGNOSIS — Z8719 Personal history of other diseases of the digestive system: Secondary | ICD-10-CM | POA: Diagnosis not present

## 2021-03-14 DIAGNOSIS — R69 Illness, unspecified: Secondary | ICD-10-CM | POA: Diagnosis not present

## 2021-03-14 DIAGNOSIS — Z1339 Encounter for screening examination for other mental health and behavioral disorders: Secondary | ICD-10-CM | POA: Diagnosis not present

## 2021-03-14 DIAGNOSIS — I10 Essential (primary) hypertension: Secondary | ICD-10-CM | POA: Diagnosis not present

## 2021-03-14 DIAGNOSIS — R002 Palpitations: Secondary | ICD-10-CM | POA: Diagnosis not present

## 2021-03-14 DIAGNOSIS — M810 Age-related osteoporosis without current pathological fracture: Secondary | ICD-10-CM | POA: Diagnosis not present

## 2021-03-14 DIAGNOSIS — Z1331 Encounter for screening for depression: Secondary | ICD-10-CM | POA: Diagnosis not present

## 2021-03-14 DIAGNOSIS — Z8673 Personal history of transient ischemic attack (TIA), and cerebral infarction without residual deficits: Secondary | ICD-10-CM | POA: Diagnosis not present

## 2021-03-14 DIAGNOSIS — R82998 Other abnormal findings in urine: Secondary | ICD-10-CM | POA: Diagnosis not present

## 2021-03-14 DIAGNOSIS — J449 Chronic obstructive pulmonary disease, unspecified: Secondary | ICD-10-CM | POA: Diagnosis not present

## 2021-03-17 DIAGNOSIS — R627 Adult failure to thrive: Secondary | ICD-10-CM | POA: Diagnosis not present

## 2021-03-17 DIAGNOSIS — N400 Enlarged prostate without lower urinary tract symptoms: Secondary | ICD-10-CM | POA: Diagnosis not present

## 2021-03-17 DIAGNOSIS — K625 Hemorrhage of anus and rectum: Secondary | ICD-10-CM | POA: Diagnosis not present

## 2021-03-17 DIAGNOSIS — M6281 Muscle weakness (generalized): Secondary | ICD-10-CM | POA: Diagnosis not present

## 2021-03-17 DIAGNOSIS — F0390 Unspecified dementia without behavioral disturbance: Secondary | ICD-10-CM | POA: Diagnosis not present

## 2021-03-17 DIAGNOSIS — Z9181 History of falling: Secondary | ICD-10-CM | POA: Diagnosis not present

## 2021-03-17 DIAGNOSIS — Z87891 Personal history of nicotine dependence: Secondary | ICD-10-CM | POA: Diagnosis not present

## 2021-03-17 DIAGNOSIS — H3530 Unspecified macular degeneration: Secondary | ICD-10-CM | POA: Diagnosis not present

## 2021-03-17 DIAGNOSIS — N3281 Overactive bladder: Secondary | ICD-10-CM | POA: Diagnosis not present

## 2021-03-17 DIAGNOSIS — M25512 Pain in left shoulder: Secondary | ICD-10-CM | POA: Diagnosis not present

## 2021-03-17 DIAGNOSIS — I951 Orthostatic hypotension: Secondary | ICD-10-CM | POA: Diagnosis not present

## 2021-03-17 DIAGNOSIS — I10 Essential (primary) hypertension: Secondary | ICD-10-CM | POA: Diagnosis not present

## 2021-03-17 DIAGNOSIS — R69 Illness, unspecified: Secondary | ICD-10-CM | POA: Diagnosis not present

## 2021-03-17 DIAGNOSIS — R41841 Cognitive communication deficit: Secondary | ICD-10-CM | POA: Diagnosis not present

## 2021-03-19 ENCOUNTER — Other Ambulatory Visit: Payer: Self-pay | Admitting: Internal Medicine

## 2021-03-19 DIAGNOSIS — H3530 Unspecified macular degeneration: Secondary | ICD-10-CM | POA: Diagnosis not present

## 2021-03-19 DIAGNOSIS — I10 Essential (primary) hypertension: Secondary | ICD-10-CM | POA: Diagnosis not present

## 2021-03-19 DIAGNOSIS — F0390 Unspecified dementia without behavioral disturbance: Secondary | ICD-10-CM | POA: Diagnosis not present

## 2021-03-19 DIAGNOSIS — N400 Enlarged prostate without lower urinary tract symptoms: Secondary | ICD-10-CM | POA: Diagnosis not present

## 2021-03-19 DIAGNOSIS — M6281 Muscle weakness (generalized): Secondary | ICD-10-CM | POA: Diagnosis not present

## 2021-03-19 DIAGNOSIS — R41841 Cognitive communication deficit: Secondary | ICD-10-CM | POA: Diagnosis not present

## 2021-03-19 DIAGNOSIS — K625 Hemorrhage of anus and rectum: Secondary | ICD-10-CM | POA: Diagnosis not present

## 2021-03-19 DIAGNOSIS — R1310 Dysphagia, unspecified: Secondary | ICD-10-CM

## 2021-03-19 DIAGNOSIS — Z87891 Personal history of nicotine dependence: Secondary | ICD-10-CM | POA: Diagnosis not present

## 2021-03-19 DIAGNOSIS — N3281 Overactive bladder: Secondary | ICD-10-CM | POA: Diagnosis not present

## 2021-03-19 DIAGNOSIS — I951 Orthostatic hypotension: Secondary | ICD-10-CM | POA: Diagnosis not present

## 2021-03-19 DIAGNOSIS — R69 Illness, unspecified: Secondary | ICD-10-CM | POA: Diagnosis not present

## 2021-03-19 DIAGNOSIS — M25512 Pain in left shoulder: Secondary | ICD-10-CM | POA: Diagnosis not present

## 2021-03-19 DIAGNOSIS — R627 Adult failure to thrive: Secondary | ICD-10-CM | POA: Diagnosis not present

## 2021-03-19 DIAGNOSIS — Z9181 History of falling: Secondary | ICD-10-CM | POA: Diagnosis not present

## 2021-03-20 DIAGNOSIS — M25512 Pain in left shoulder: Secondary | ICD-10-CM | POA: Diagnosis not present

## 2021-03-20 DIAGNOSIS — I951 Orthostatic hypotension: Secondary | ICD-10-CM | POA: Diagnosis not present

## 2021-03-20 DIAGNOSIS — Z87891 Personal history of nicotine dependence: Secondary | ICD-10-CM | POA: Diagnosis not present

## 2021-03-20 DIAGNOSIS — N3281 Overactive bladder: Secondary | ICD-10-CM | POA: Diagnosis not present

## 2021-03-20 DIAGNOSIS — H3530 Unspecified macular degeneration: Secondary | ICD-10-CM | POA: Diagnosis not present

## 2021-03-20 DIAGNOSIS — F0390 Unspecified dementia without behavioral disturbance: Secondary | ICD-10-CM | POA: Diagnosis not present

## 2021-03-20 DIAGNOSIS — R69 Illness, unspecified: Secondary | ICD-10-CM | POA: Diagnosis not present

## 2021-03-20 DIAGNOSIS — I10 Essential (primary) hypertension: Secondary | ICD-10-CM | POA: Diagnosis not present

## 2021-03-20 DIAGNOSIS — K625 Hemorrhage of anus and rectum: Secondary | ICD-10-CM | POA: Diagnosis not present

## 2021-03-20 DIAGNOSIS — N400 Enlarged prostate without lower urinary tract symptoms: Secondary | ICD-10-CM | POA: Diagnosis not present

## 2021-03-20 DIAGNOSIS — R41841 Cognitive communication deficit: Secondary | ICD-10-CM | POA: Diagnosis not present

## 2021-03-20 DIAGNOSIS — R627 Adult failure to thrive: Secondary | ICD-10-CM | POA: Diagnosis not present

## 2021-03-20 DIAGNOSIS — M6281 Muscle weakness (generalized): Secondary | ICD-10-CM | POA: Diagnosis not present

## 2021-03-20 DIAGNOSIS — Z9181 History of falling: Secondary | ICD-10-CM | POA: Diagnosis not present

## 2021-03-21 DIAGNOSIS — Z87891 Personal history of nicotine dependence: Secondary | ICD-10-CM | POA: Diagnosis not present

## 2021-03-21 DIAGNOSIS — Z9181 History of falling: Secondary | ICD-10-CM | POA: Diagnosis not present

## 2021-03-21 DIAGNOSIS — N3281 Overactive bladder: Secondary | ICD-10-CM | POA: Diagnosis not present

## 2021-03-21 DIAGNOSIS — K625 Hemorrhage of anus and rectum: Secondary | ICD-10-CM | POA: Diagnosis not present

## 2021-03-21 DIAGNOSIS — F0390 Unspecified dementia without behavioral disturbance: Secondary | ICD-10-CM | POA: Diagnosis not present

## 2021-03-21 DIAGNOSIS — R627 Adult failure to thrive: Secondary | ICD-10-CM | POA: Diagnosis not present

## 2021-03-21 DIAGNOSIS — H3530 Unspecified macular degeneration: Secondary | ICD-10-CM | POA: Diagnosis not present

## 2021-03-21 DIAGNOSIS — I10 Essential (primary) hypertension: Secondary | ICD-10-CM | POA: Diagnosis not present

## 2021-03-21 DIAGNOSIS — R41841 Cognitive communication deficit: Secondary | ICD-10-CM | POA: Diagnosis not present

## 2021-03-21 DIAGNOSIS — M6281 Muscle weakness (generalized): Secondary | ICD-10-CM | POA: Diagnosis not present

## 2021-03-21 DIAGNOSIS — M25512 Pain in left shoulder: Secondary | ICD-10-CM | POA: Diagnosis not present

## 2021-03-21 DIAGNOSIS — I951 Orthostatic hypotension: Secondary | ICD-10-CM | POA: Diagnosis not present

## 2021-03-21 DIAGNOSIS — N400 Enlarged prostate without lower urinary tract symptoms: Secondary | ICD-10-CM | POA: Diagnosis not present

## 2021-03-21 DIAGNOSIS — R69 Illness, unspecified: Secondary | ICD-10-CM | POA: Diagnosis not present

## 2021-03-24 DIAGNOSIS — Z87891 Personal history of nicotine dependence: Secondary | ICD-10-CM | POA: Diagnosis not present

## 2021-03-24 DIAGNOSIS — N3281 Overactive bladder: Secondary | ICD-10-CM | POA: Diagnosis not present

## 2021-03-24 DIAGNOSIS — I951 Orthostatic hypotension: Secondary | ICD-10-CM | POA: Diagnosis not present

## 2021-03-24 DIAGNOSIS — R69 Illness, unspecified: Secondary | ICD-10-CM | POA: Diagnosis not present

## 2021-03-24 DIAGNOSIS — H3530 Unspecified macular degeneration: Secondary | ICD-10-CM | POA: Diagnosis not present

## 2021-03-24 DIAGNOSIS — F0390 Unspecified dementia without behavioral disturbance: Secondary | ICD-10-CM | POA: Diagnosis not present

## 2021-03-24 DIAGNOSIS — R627 Adult failure to thrive: Secondary | ICD-10-CM | POA: Diagnosis not present

## 2021-03-24 DIAGNOSIS — M25512 Pain in left shoulder: Secondary | ICD-10-CM | POA: Diagnosis not present

## 2021-03-24 DIAGNOSIS — Z9181 History of falling: Secondary | ICD-10-CM | POA: Diagnosis not present

## 2021-03-24 DIAGNOSIS — M6281 Muscle weakness (generalized): Secondary | ICD-10-CM | POA: Diagnosis not present

## 2021-03-24 DIAGNOSIS — N400 Enlarged prostate without lower urinary tract symptoms: Secondary | ICD-10-CM | POA: Diagnosis not present

## 2021-03-24 DIAGNOSIS — R41841 Cognitive communication deficit: Secondary | ICD-10-CM | POA: Diagnosis not present

## 2021-03-24 DIAGNOSIS — K625 Hemorrhage of anus and rectum: Secondary | ICD-10-CM | POA: Diagnosis not present

## 2021-03-24 DIAGNOSIS — I10 Essential (primary) hypertension: Secondary | ICD-10-CM | POA: Diagnosis not present

## 2021-03-26 ENCOUNTER — Encounter: Payer: Self-pay | Admitting: Podiatry

## 2021-03-26 ENCOUNTER — Ambulatory Visit: Payer: Medicare HMO | Admitting: Podiatry

## 2021-03-26 ENCOUNTER — Other Ambulatory Visit: Payer: Self-pay

## 2021-03-26 DIAGNOSIS — M79674 Pain in right toe(s): Secondary | ICD-10-CM

## 2021-03-26 DIAGNOSIS — M79675 Pain in left toe(s): Secondary | ICD-10-CM | POA: Diagnosis not present

## 2021-03-26 DIAGNOSIS — B351 Tinea unguium: Secondary | ICD-10-CM

## 2021-03-28 ENCOUNTER — Other Ambulatory Visit: Payer: Medicare HMO

## 2021-03-28 DIAGNOSIS — N3281 Overactive bladder: Secondary | ICD-10-CM | POA: Diagnosis not present

## 2021-03-28 DIAGNOSIS — M25512 Pain in left shoulder: Secondary | ICD-10-CM | POA: Diagnosis not present

## 2021-03-28 DIAGNOSIS — I10 Essential (primary) hypertension: Secondary | ICD-10-CM | POA: Diagnosis not present

## 2021-03-28 DIAGNOSIS — K625 Hemorrhage of anus and rectum: Secondary | ICD-10-CM | POA: Diagnosis not present

## 2021-03-28 DIAGNOSIS — R41841 Cognitive communication deficit: Secondary | ICD-10-CM | POA: Diagnosis not present

## 2021-03-28 DIAGNOSIS — Z87891 Personal history of nicotine dependence: Secondary | ICD-10-CM | POA: Diagnosis not present

## 2021-03-28 DIAGNOSIS — N400 Enlarged prostate without lower urinary tract symptoms: Secondary | ICD-10-CM | POA: Diagnosis not present

## 2021-03-28 DIAGNOSIS — M6281 Muscle weakness (generalized): Secondary | ICD-10-CM | POA: Diagnosis not present

## 2021-03-28 DIAGNOSIS — I951 Orthostatic hypotension: Secondary | ICD-10-CM | POA: Diagnosis not present

## 2021-03-28 DIAGNOSIS — R69 Illness, unspecified: Secondary | ICD-10-CM | POA: Diagnosis not present

## 2021-03-28 DIAGNOSIS — F0390 Unspecified dementia without behavioral disturbance: Secondary | ICD-10-CM | POA: Diagnosis not present

## 2021-03-28 DIAGNOSIS — H3530 Unspecified macular degeneration: Secondary | ICD-10-CM | POA: Diagnosis not present

## 2021-03-28 DIAGNOSIS — Z9181 History of falling: Secondary | ICD-10-CM | POA: Diagnosis not present

## 2021-03-28 DIAGNOSIS — R627 Adult failure to thrive: Secondary | ICD-10-CM | POA: Diagnosis not present

## 2021-03-30 ENCOUNTER — Encounter: Payer: Self-pay | Admitting: Podiatry

## 2021-03-30 DIAGNOSIS — R1310 Dysphagia, unspecified: Secondary | ICD-10-CM | POA: Insufficient documentation

## 2021-03-30 DIAGNOSIS — R42 Dizziness and giddiness: Secondary | ICD-10-CM | POA: Insufficient documentation

## 2021-03-30 DIAGNOSIS — Z0000 Encounter for general adult medical examination without abnormal findings: Secondary | ICD-10-CM | POA: Insufficient documentation

## 2021-03-30 DIAGNOSIS — N39490 Overflow incontinence: Secondary | ICD-10-CM | POA: Insufficient documentation

## 2021-03-30 DIAGNOSIS — Z7189 Other specified counseling: Secondary | ICD-10-CM | POA: Insufficient documentation

## 2021-03-30 NOTE — Progress Notes (Signed)
  Subjective:  Patient ID: Kevin Page, male    DOB: [birth year],  MRN: [id number]  Kevin Page presents to clinic today for painful thick toenails that are difficult to trim. Pain interferes with ambulation. Aggravating factors include wearing enclosed shoe gear. Pain is relieved with periodic professional debridement.  He is accompanied by his wife on today's visit. They voice no new pedal problems on today.  PCP is Ginger Organ., MD , and last visit was 03/17/2021.  No Known Allergies  Review of Systems: Negative except as noted in the HPI. Objective:   Constitutional Kevin Page is a pleasant 87 y.o. Caucasian male, WD, WN in NAD. AAO x 3.   Vascular Capillary refill time to digits immediate b/l. Palpable DP pulse(s) b/l lower extremities Faintly palpable PT pulse(s) b/l lower extremities. Pedal hair absent. No pain with calf compression b/l. Lower extremity skin temperature gradient warm to cool. No cyanosis or clubbing noted.  Neurologic Normal speech. Oriented to person, place, and time. Protective sensation intact 5/5 intact bilaterally with 10g monofilament b/l.  Dermatologic Pedal skin thin, shiny and atrophic b/l LE. No open wounds b/l LE. No interdigital macerations noted b/l LE. Toenails 1-5 b/l elongated, discolored, dystrophic, thickened, crumbly with subungual debris and tenderness to dorsal palpation.  Orthopedic: Normal muscle strength 5/5 to all lower extremity muscle groups bilaterally. No pain, crepitus or joint limitation noted with ROM b/l LE. No gross bony pedal deformities b/l. Patient ambulates independently without assistive aids. HAV with bunion deformity noted b/l LE. Hammertoe deformity noted 2-5 b/l.   Radiographs: None Assessment:   1. Pain due to onychomycosis of toenails of both feet    Plan:  Patient was evaluated and treated and all questions answered. Consent given for treatment as described below: -No new findings. No new orders. -Mycotic  toenails 1-5 bilaterally were debrided in length and girth with sterile nail nippers and dremel without incident. -Patient/POA to call should there be question/concern in the interim.  Return in about 3 months (around 06/26/2021).  Marzetta Board, DPM

## 2021-03-31 DIAGNOSIS — R627 Adult failure to thrive: Secondary | ICD-10-CM | POA: Diagnosis not present

## 2021-03-31 DIAGNOSIS — I951 Orthostatic hypotension: Secondary | ICD-10-CM | POA: Diagnosis not present

## 2021-03-31 DIAGNOSIS — R69 Illness, unspecified: Secondary | ICD-10-CM | POA: Diagnosis not present

## 2021-03-31 DIAGNOSIS — Z9181 History of falling: Secondary | ICD-10-CM | POA: Diagnosis not present

## 2021-03-31 DIAGNOSIS — M25512 Pain in left shoulder: Secondary | ICD-10-CM | POA: Diagnosis not present

## 2021-03-31 DIAGNOSIS — M6281 Muscle weakness (generalized): Secondary | ICD-10-CM | POA: Diagnosis not present

## 2021-03-31 DIAGNOSIS — I10 Essential (primary) hypertension: Secondary | ICD-10-CM | POA: Diagnosis not present

## 2021-03-31 DIAGNOSIS — K625 Hemorrhage of anus and rectum: Secondary | ICD-10-CM | POA: Diagnosis not present

## 2021-03-31 DIAGNOSIS — H3530 Unspecified macular degeneration: Secondary | ICD-10-CM | POA: Diagnosis not present

## 2021-03-31 DIAGNOSIS — N3281 Overactive bladder: Secondary | ICD-10-CM | POA: Diagnosis not present

## 2021-03-31 DIAGNOSIS — Z87891 Personal history of nicotine dependence: Secondary | ICD-10-CM | POA: Diagnosis not present

## 2021-03-31 DIAGNOSIS — N400 Enlarged prostate without lower urinary tract symptoms: Secondary | ICD-10-CM | POA: Diagnosis not present

## 2021-03-31 DIAGNOSIS — F0390 Unspecified dementia without behavioral disturbance: Secondary | ICD-10-CM | POA: Diagnosis not present

## 2021-03-31 DIAGNOSIS — R41841 Cognitive communication deficit: Secondary | ICD-10-CM | POA: Diagnosis not present

## 2021-04-02 ENCOUNTER — Ambulatory Visit
Admission: RE | Admit: 2021-04-02 | Discharge: 2021-04-02 | Disposition: A | Discharge status: Home / Self Care | Payer: Medicare HMO | Source: Ambulatory Visit | Attending: Internal Medicine | Admitting: Internal Medicine

## 2021-04-02 DIAGNOSIS — R1310 Dysphagia, unspecified: Secondary | ICD-10-CM

## 2021-04-03 DIAGNOSIS — M25512 Pain in left shoulder: Secondary | ICD-10-CM | POA: Diagnosis not present

## 2021-04-03 DIAGNOSIS — H3530 Unspecified macular degeneration: Secondary | ICD-10-CM | POA: Diagnosis not present

## 2021-04-03 DIAGNOSIS — I951 Orthostatic hypotension: Secondary | ICD-10-CM | POA: Diagnosis not present

## 2021-04-03 DIAGNOSIS — F0390 Unspecified dementia without behavioral disturbance: Secondary | ICD-10-CM | POA: Diagnosis not present

## 2021-04-03 DIAGNOSIS — Z9181 History of falling: Secondary | ICD-10-CM | POA: Diagnosis not present

## 2021-04-03 DIAGNOSIS — I10 Essential (primary) hypertension: Secondary | ICD-10-CM | POA: Diagnosis not present

## 2021-04-03 DIAGNOSIS — R627 Adult failure to thrive: Secondary | ICD-10-CM | POA: Diagnosis not present

## 2021-04-03 DIAGNOSIS — N3281 Overactive bladder: Secondary | ICD-10-CM | POA: Diagnosis not present

## 2021-04-03 DIAGNOSIS — N400 Enlarged prostate without lower urinary tract symptoms: Secondary | ICD-10-CM | POA: Diagnosis not present

## 2021-04-03 DIAGNOSIS — R41841 Cognitive communication deficit: Secondary | ICD-10-CM | POA: Diagnosis not present

## 2021-04-03 DIAGNOSIS — R69 Illness, unspecified: Secondary | ICD-10-CM | POA: Diagnosis not present

## 2021-04-03 DIAGNOSIS — K625 Hemorrhage of anus and rectum: Secondary | ICD-10-CM | POA: Diagnosis not present

## 2021-04-03 DIAGNOSIS — Z87891 Personal history of nicotine dependence: Secondary | ICD-10-CM | POA: Diagnosis not present

## 2021-04-03 DIAGNOSIS — M6281 Muscle weakness (generalized): Secondary | ICD-10-CM | POA: Diagnosis not present

## 2021-04-14 DIAGNOSIS — F0390 Unspecified dementia without behavioral disturbance: Secondary | ICD-10-CM | POA: Diagnosis not present

## 2021-04-14 DIAGNOSIS — H3530 Unspecified macular degeneration: Secondary | ICD-10-CM | POA: Diagnosis not present

## 2021-04-14 DIAGNOSIS — K625 Hemorrhage of anus and rectum: Secondary | ICD-10-CM | POA: Diagnosis not present

## 2021-04-14 DIAGNOSIS — R627 Adult failure to thrive: Secondary | ICD-10-CM | POA: Diagnosis not present

## 2021-04-14 DIAGNOSIS — R69 Illness, unspecified: Secondary | ICD-10-CM | POA: Diagnosis not present

## 2021-04-14 DIAGNOSIS — N3281 Overactive bladder: Secondary | ICD-10-CM | POA: Diagnosis not present

## 2021-04-14 DIAGNOSIS — Z9181 History of falling: Secondary | ICD-10-CM | POA: Diagnosis not present

## 2021-04-14 DIAGNOSIS — Z87891 Personal history of nicotine dependence: Secondary | ICD-10-CM | POA: Diagnosis not present

## 2021-04-14 DIAGNOSIS — M810 Age-related osteoporosis without current pathological fracture: Secondary | ICD-10-CM | POA: Diagnosis not present

## 2021-04-14 DIAGNOSIS — I10 Essential (primary) hypertension: Secondary | ICD-10-CM | POA: Diagnosis not present

## 2021-04-14 DIAGNOSIS — M6281 Muscle weakness (generalized): Secondary | ICD-10-CM | POA: Diagnosis not present

## 2021-04-14 DIAGNOSIS — I951 Orthostatic hypotension: Secondary | ICD-10-CM | POA: Diagnosis not present

## 2021-04-14 DIAGNOSIS — N400 Enlarged prostate without lower urinary tract symptoms: Secondary | ICD-10-CM | POA: Diagnosis not present

## 2021-04-14 DIAGNOSIS — M25512 Pain in left shoulder: Secondary | ICD-10-CM | POA: Diagnosis not present

## 2021-04-14 DIAGNOSIS — R41841 Cognitive communication deficit: Secondary | ICD-10-CM | POA: Diagnosis not present

## 2021-04-25 ENCOUNTER — Emergency Department (HOSPITAL_COMMUNITY): Payer: Medicare HMO

## 2021-04-25 ENCOUNTER — Emergency Department (HOSPITAL_COMMUNITY)
Admission: EM | Admit: 2021-04-25 | Discharge: 2021-04-25 | Disposition: A | Discharge status: Home / Self Care | Payer: Medicare HMO | Attending: Emergency Medicine | Admitting: Emergency Medicine

## 2021-04-25 ENCOUNTER — Encounter (HOSPITAL_COMMUNITY): Payer: Self-pay

## 2021-04-25 ENCOUNTER — Other Ambulatory Visit: Payer: Self-pay

## 2021-04-25 DIAGNOSIS — R410 Disorientation, unspecified: Secondary | ICD-10-CM | POA: Diagnosis not present

## 2021-04-25 DIAGNOSIS — I10 Essential (primary) hypertension: Secondary | ICD-10-CM | POA: Insufficient documentation

## 2021-04-25 DIAGNOSIS — Z87891 Personal history of nicotine dependence: Secondary | ICD-10-CM | POA: Insufficient documentation

## 2021-04-25 DIAGNOSIS — R4182 Altered mental status, unspecified: Secondary | ICD-10-CM | POA: Diagnosis not present

## 2021-04-25 DIAGNOSIS — M19012 Primary osteoarthritis, left shoulder: Secondary | ICD-10-CM | POA: Diagnosis not present

## 2021-04-25 DIAGNOSIS — R001 Bradycardia, unspecified: Secondary | ICD-10-CM | POA: Insufficient documentation

## 2021-04-25 DIAGNOSIS — R404 Transient alteration of awareness: Secondary | ICD-10-CM | POA: Diagnosis not present

## 2021-04-25 DIAGNOSIS — Z743 Need for continuous supervision: Secondary | ICD-10-CM | POA: Diagnosis not present

## 2021-04-25 DIAGNOSIS — R0902 Hypoxemia: Secondary | ICD-10-CM | POA: Diagnosis not present

## 2021-04-25 DIAGNOSIS — M19011 Primary osteoarthritis, right shoulder: Secondary | ICD-10-CM | POA: Diagnosis not present

## 2021-04-25 DIAGNOSIS — Z20822 Contact with and (suspected) exposure to covid-19: Secondary | ICD-10-CM | POA: Insufficient documentation

## 2021-04-25 LAB — CBC WITH DIFFERENTIAL/PLATELET
Abs Immature Granulocytes: 0.02 10*3/uL (ref 0.00–0.07)
Basophils Absolute: 0 10*3/uL (ref 0.0–0.1)
Basophils Relative: 0 %
Eosinophils Absolute: 0.2 10*3/uL (ref 0.0–0.5)
Eosinophils Relative: 3 %
HCT: 38.5 % — ABNORMAL LOW (ref 39.0–52.0)
Hemoglobin: 12.4 g/dL — ABNORMAL LOW (ref 13.0–17.0)
Immature Granulocytes: 0 %
Lymphocytes Relative: 15 %
Lymphs Abs: 1.2 10*3/uL (ref 0.7–4.0)
MCH: 30.2 pg (ref 26.0–34.0)
MCHC: 32.2 g/dL (ref 30.0–36.0)
MCV: 93.7 fL (ref 80.0–100.0)
Monocytes Absolute: 0.5 10*3/uL (ref 0.1–1.0)
Monocytes Relative: 6 %
Neutro Abs: 5.8 10*3/uL (ref 1.7–7.7)
Neutrophils Relative %: 76 %
Platelets: 270 10*3/uL (ref 150–400)
RBC: 4.11 MIL/uL — ABNORMAL LOW (ref 4.22–5.81)
RDW: 14.3 % (ref 11.5–15.5)
WBC: 7.8 10*3/uL (ref 4.0–10.5)
nRBC: 0 % (ref 0.0–0.2)

## 2021-04-25 LAB — BASIC METABOLIC PANEL
Anion gap: 6 (ref 5–15)
BUN: 15 mg/dL (ref 8–23)
CO2: 28 mmol/L (ref 22–32)
Calcium: 8.8 mg/dL — ABNORMAL LOW (ref 8.9–10.3)
Chloride: 102 mmol/L (ref 98–111)
Creatinine, Ser: 1.02 mg/dL (ref 0.61–1.24)
GFR, Estimated: >60 mL/min (ref >60)
Glucose, Bld: 153 mg/dL — ABNORMAL HIGH (ref 70–99)
Potassium: 3.9 mmol/L (ref 3.5–5.1)
Sodium: 136 mmol/L (ref 135–145)

## 2021-04-25 LAB — TROPONIN I (HIGH SENSITIVITY)
Troponin I (High Sensitivity): 21 ng/L — ABNORMAL HIGH (ref <18)
Troponin I (High Sensitivity): 20 ng/L — ABNORMAL HIGH (ref <18)

## 2021-04-25 LAB — RESP PANEL BY RT-PCR (FLU A&B, COVID) ARPGX2
Influenza A by PCR: NEGATIVE
Influenza B by PCR: NEGATIVE
SARS Coronavirus 2 by RT PCR: NEGATIVE

## 2021-04-25 LAB — MAGNESIUM: Magnesium: 2.1 mg/dL (ref 1.7–2.4)

## 2021-04-25 MED ORDER — MIDAZOLAM HCL 2 MG/2ML IJ SOLN
2.0000 mg | Freq: Once | INTRAMUSCULAR | Status: DC
  Filled 2021-04-25: qty 2

## 2021-04-25 NOTE — ED Notes (Signed)
Discharge instructions reviewed with patient and wife. Patient and wife verbalized understanding of instructions. Follow-up care and medications were reviewed. Patient ambulatory with steady gait. VSS upon discharge.

## 2021-04-25 NOTE — ED Provider Notes (Signed)
[facility]PART[facility] Provider Note   CSN: [id number] Arrival date & time: 04/25/21  1050     History Chief Complaint  Patient presents with   Episode of AMS    Kevin Page is a 87 y.o. male with a history of cognitive impairment, likely lower GI bleed, dementia, hypertension, hyperlipidemia, former smoker (44 years, quit 1990s), SVT episode, presented to emergency department episode for altered mental status.  Patient's wife brought supplemental history.  She reports that he has limited mobility at baseline, but has been walking more today.  She stated he sat down with her at the table this morning, and then there was a period of about 30 minutes where he was not talking to her, he was drooling, she noted that his left hand was rhythmically twitching and he was holding his left forearm with the right hand.  She went to get her sister to check in on him.  At that time he seemed to wake up and was talking to them again.  This is never happened before.  She reports that normally he is quite lucid, though he does have dementia, he is able to converse with her easily.    There was no loss of consciousness.  The patient does not recall this episode.  There is no reported history of seizures, according to the patient and his wife.  They also denies history of TIA or stroke  Per his medical record review, the patient did have a history of syncope in the past.  He was noted to have a history of bradycardia particularly with sleep, heart rate dropping into the 40s.  Per his last cardiology evaluation May 2021 the patient was diagnosed with possible paroxysmal A. fib, versus SVT.  He does not appear to be on anticoagulation per office note or per his medical chart.  HPI     Past Medical History:  Diagnosis Date   Allergy    dust and mites   BPH (benign prostatic hyperplasia)    Essential hypertension    Hyperlipidemia    Macular degeneration    Osteoarthritis of  both knees    Sinus bradycardia    SVT (supraventricular tachycardia) ([place])    a. Event monitor 2014: few episodes of SVT, longest 12 beats with rate 128.   Syncope     Patient Active Problem List   Diagnosis Date Noted   Dysphagia 03/30/2021   Disequilibrium 03/30/2021   Encounter for general adult medical examination without abnormal findings 03/30/2021   Overflow incontinence of urine 03/30/2021   Diverticular hemorrhage    Rectal bleeding 01/23/2021   Painless rectal bleeding 01/22/2021   Cognitive impairment 01/22/2021   Abnormal gait 10/12/2019   Palpitations 06/13/2019   SVT (supraventricular tachycardia) ([facility])    Sinus bradycardia    Osteoarthritis of both knees    Macular degeneration    Essential hypertension    Allergy    Advanced dry age-related macular degeneration of both eyes with subfoveal involvement 03/03/2016   Hypertensive retinopathy of both eyes 03/03/2016   Pseudophakia of both eyes 03/03/2016   PVD (posterior vitreous detachment), both eyes 03/03/2016   Orthostasis 01/19/2016   Dilated aortic root ([place]) 01/19/2016   PSVT (paroxysmal supraventricular tachycardia) ([place]) 01/19/2016   Mild aortic insufficiency 01/19/2016   Sinus bradycardia seen on cardiac monitor 01/18/2016   Postnasal drip 11/05/2014   Age-related osteoporosis without current pathological fracture 11/13/2013   Incontinence of feces 11/01/2013   Tobacco user 11/01/2013  Bradycardia 01/30/2013   Syncope 01/12/2013   Hypertension 01/12/2013   Hyperlipidemia 01/12/2013   BPH (benign prostatic hyperplasia) 01/12/2013   Personal history of transient ischemic attack (TIA), and cerebral infarction without residual deficits 01/13/2012   Chronic obstructive pulmonary disease ([place]) 10/19/2011   Osteoarthritis 05/21/2011    Past Surgical History:  Procedure Laterality Date   CATARACT EXTRACTION W/ INTRAOCULAR LENS  IMPLANT, BILATERAL Bilateral 2011   dupytron contractions  1998 , 2006    both hands   LUMBAR FUSION  1994       Family History  Problem Relation Age of Onset   Cancer Brother        tonsils   Heart attack Neg Hx     Social History   Tobacco Use   Smoking status: Former   Smokeless tobacco: Never   Tobacco comments:    Smoked for 44 years, quit 1990s  Vaping Use   Vaping Use: Never used  Substance Use Topics   Alcohol use: Yes    Alcohol/week: 12.0 standard drinks    Types: 12 Standard drinks or equivalent per week    Comment: 1 drink of gin nightly, occasionally more if going out to dinner.   Drug use: No    Home Medications Prior to Admission medications   Medication Sig Start Date End Date Taking? Authorizing Provider  cholecalciferol (VITAMIN D3) 25 MCG (1000 UNIT) tablet Take 1,000 Units by mouth daily.   Yes [provider]  donepezil (ARICEPT) 10 MG tablet Take 10 mg by mouth at bedtime. 03/28/17  Yes [provider]  dutasteride (AVODART) 0.5 MG capsule Take 0.5 mg by mouth at bedtime.   Yes [provider]  guaiFENesin (MUCINEX) 600 MG 12 hr tablet Take 1 tablet (600 mg total) by mouth 2 (two) times daily. Patient taking differently: Take 600 mg by mouth daily. 01/25/21  Yes Florencia Reasons, MD  aspirin EC 81 MG tablet Take 1 tablet (81 mg total) by mouth daily. Hold for now, resume when ok with GI Patient not taking: Reported on 04/25/2021 01/25/21   Florencia Reasons, MD    Allergies    Patient has no known allergies.  Review of Systems   Review of Systems  Constitutional:  Negative for chills and fever.  Eyes:  Negative for pain and visual disturbance.  Respiratory:  Negative for cough and shortness of breath.   Cardiovascular:  Negative for chest pain and palpitations.  Gastrointestinal:  Negative for abdominal pain and vomiting.  Genitourinary:  Negative for dysuria and hematuria.  Musculoskeletal:  Negative for arthralgias and back pain.  Skin:  Negative for color change and rash.  Neurological:  Negative for  syncope, light-headedness and headaches.  All other systems reviewed and are negative.  Physical Exam Updated Vital Signs BP (!) 161/69 (BP Location: Right Arm)   Pulse 77   Temp 98.3 F (36.8 C) (Oral)   Resp 20   Ht 6' (1.829 m)   Wt 65.8 kg   SpO2 100%   BMI 19.67 kg/m   Physical Exam Constitutional:      General: He is not in acute distress. HENT:     Head: Normocephalic and atraumatic.  Eyes:     Conjunctiva/sclera: Conjunctivae normal.     Pupils: Pupils are equal, round, and reactive to light.  Cardiovascular:     Rate and Rhythm: Normal rate and regular rhythm.     Comments: HR 50 bpm Pulmonary:     Effort: Pulmonary effort is  normal. No respiratory distress.  Abdominal:     General: There is no distension.     Tenderness: There is no abdominal tenderness.  Skin:    General: Skin is warm and dry.  Neurological:     General: No focal deficit present.     Mental Status: He is alert. Mental status is at baseline.     Cranial Nerves: No cranial nerve deficit.     Sensory: No sensory deficit.     Motor: No weakness.    ED Results / Procedures / Treatments   Labs (all labs ordered are listed, but only abnormal results are displayed) Labs Reviewed  BASIC METABOLIC PANEL - Abnormal; Notable for the following components:      Result Value   Glucose, Bld 153 (*)    Calcium 8.8 (*)    All other components within normal limits  CBC WITH DIFFERENTIAL/PLATELET - Abnormal; Notable for the following components:   RBC 4.11 (*)    Hemoglobin 12.4 (*)    HCT 38.5 (*)    All other components within normal limits  TROPONIN I (HIGH SENSITIVITY) - Abnormal; Notable for the following components:   Troponin I (High Sensitivity) 21 (*)    All other components within normal limits  TROPONIN I (HIGH SENSITIVITY) - Abnormal; Notable for the following components:   Troponin I (High Sensitivity) 20 (*)    All other components within normal limits  RESP PANEL BY RT-PCR (FLU A&B,  COVID) ARPGX2  MAGNESIUM    EKG EKG Interpretation  Date/Time:  Friday April 25 2021 10:54:45 EST Ventricular Rate:  56 PR Interval:  56 QRS Duration: 94 QT Interval:  443 QTC Calculation: 428 R Axis:   21 Text Interpretation: Sinus rhythm vs atrial arrhythmia - no discernable P waves Abnormal R-wave progression, early transition Confirmed by Octaviano Glow [phone]) on 04/25/2021 11:09:46 AM  Radiology CT HEAD WO CONTRAST (5MM)  Result Date: 04/25/2021 CLINICAL DATA:  TIA.  Mental status change EXAM: CT HEAD WITHOUT CONTRAST TECHNIQUE: Contiguous axial images were obtained from the base of the skull through the vertex without intravenous contrast. COMPARISON:  CT head 12/08/2019 FINDINGS: Brain: Mild to moderate generalized atrophy. Patchy hypodensity throughout the cerebral white matter unchanged. Negative for acute infarct, hemorrhage, mass Vascular: Negative for hyperdense vessel Skull: Negative Sinuses/Orbits: Mild mucosal edema paranasal sinuses. Bilateral cataract extraction Other: None IMPRESSION: Atrophy and chronic microvascular ischemic change. No acute abnormality. Electronically Signed   By: Franchot Gallo M.D.   On: 04/25/2021 12:41   DG Chest Portable 1 View  Result Date: 04/25/2021 CLINICAL DATA:  Evaluate for aspiration, altered mental status EXAM: PORTABLE CHEST 1 VIEW COMPARISON:  01/25/2021 FINDINGS: Heart and mediastinum are normal. Severe elevation of the left hemidiaphragm. Severe bilateral glenohumeral arthrosis. IMPRESSION: Severe elevation of the left hemidiaphragm. No acute abnormality of the lungs in AP portable projection. Electronically Signed   By: Delanna Ahmadi M.D.   On: 04/25/2021 11:23    Procedures Procedures   Medications Ordered in ED Medications - No data to display  ED Course  I have reviewed the triage vital signs and the nursing notes.  Pertinent labs & imaging results that were available during my care of the patient were reviewed by me  and considered in my medical decision making (see chart for details).  This patient presents with transient episode of altered mental status.  This involves an extensive number of treatment options, and is a complaint that carries with it a high risk  of complications and morbidity.  The differential diagnosis includes TIA versus CVA versus dementia related condition versus UTI versus arrhythmia vs other.  He is back to his baseline mental state.  No chest pain or pressure.  EKG on arrival shows HR 56, unclear if sinus rhythm as no discernable P waves.  Hx of POSSIBLE A Fib vs SVT - not on A/C.  I ordered, reviewed, and interpreted labs, showing no clear evidence of infection, anemia, ACS, PE, or dehydration.  Hgb 12.4.  Covid/flu negative I ordered imaging studies which included CTH xray clear I independently visualized and interpreted imaging which showed no life-threatening abnormalities, and the monitor tracing which showed regular rhythm/bradycardia (HR 50's) Additional history was obtained from patient's wife at bedside  Patient remained at baseline mental state throughout his 4.5 hour observation in the ED according to his wife at bedside.    At this time I doubt ACS, sepsis, PE, CVA, or other acute medical emergency.  This seems unlikely a new onset seizure with no LOC and no prior hx and no inciting factors.  Clinical Course as of 04/25/21 1715  Fri Apr 25, 2021  1454 Patient cannot tolerate MRI.  Is not clear if he was claustrophobic, he cannot recall his episode.  We attempted to give Versed to the patient return to the room prior to initiation of that medication.  I discussion with the patient and his wife about repeat attempt at MRI, but at this time I think the risks of sedation in this patient with dementia is quite high.  I would instead advise that we proceed conservatively, that she call 911 again if the patient develops any strokelike symptoms.  The patient is getting quite  uncomfortable here and they are wanting to go home, I think this is reasonable. [MT]    Clinical Course User Index [MT] Camree Wigington, Carola Rhine, MD    Final Clinical Impression(s) / ED Diagnoses Final diagnoses:  Confusion    Rx / DC Orders ED Discharge Orders     None        Wyvonnia Dusky, MD 04/25/21 [phone]

## 2021-04-25 NOTE — ED Triage Notes (Signed)
Pt arrived to ED via EMS from home. Wife called EMS d/t pt had an episode of AMS lasting approximately 32min. Pt has dementia at baseline and is oriented x 2 (self, place). Wife described episode as pt not responding to her, staring off, repetitive movement in L hand and drooling. VSS w/ EMS. CBG 163. 20g L AC. Hx of A-fib.

## 2021-04-25 NOTE — ED Notes (Signed)
Went to MRI to medicate pt so he could get his MRI scan. MRI staff told this RN that they would hold pt in MRI so this RN could bring over meds. Walked to MRI w/ meds to find pt to medicate him. MRI staff reported that they just transported him back to the ED. Notified MD about delay in treatment. Asked MRI to put transport back in for him.

## 2021-04-25 NOTE — Discharge Instructions (Addendum)
Damarius was seen in the ER for an episode of confusion and unresponsiveness today, which had since resolved.  His blood tests and CT scan of the brain did not show signs of serious infection, dehydration, heart attack, stroke, or brain tumor.  We did attempt an MRI scan, but [place] could not tolerate lying still for the scan.  The reason we wanted to do the scan was to look for signs of TIA, or mini stroke.  Because we did not get this work-up done, I would recommend that you keep an eye out for new symptoms of stroke at home.  This would include facial droop, difficulty with speech, numbness or weakness in an arm or leg, sudden balance problems, or severe headache.  If Nathon develops these symptoms please call 911 immediately.  Otherwise please call to schedule a follow up appointment with his doctor early next week if possible.

## 2021-04-25 NOTE — ED Notes (Signed)
Pt transported to MRI 

## 2021-05-01 DIAGNOSIS — G459 Transient cerebral ischemic attack, unspecified: Secondary | ICD-10-CM | POA: Diagnosis not present

## 2021-05-01 DIAGNOSIS — Z8673 Personal history of transient ischemic attack (TIA), and cerebral infarction without residual deficits: Secondary | ICD-10-CM | POA: Diagnosis not present

## 2021-05-01 DIAGNOSIS — R778 Other specified abnormalities of plasma proteins: Secondary | ICD-10-CM | POA: Diagnosis not present

## 2021-05-01 DIAGNOSIS — R4701 Aphasia: Secondary | ICD-10-CM | POA: Diagnosis not present

## 2021-05-01 DIAGNOSIS — R9431 Abnormal electrocardiogram [ECG] [EKG]: Secondary | ICD-10-CM | POA: Diagnosis not present

## 2021-05-01 DIAGNOSIS — R404 Transient alteration of awareness: Secondary | ICD-10-CM | POA: Diagnosis not present

## 2021-05-20 DIAGNOSIS — M25512 Pain in left shoulder: Secondary | ICD-10-CM | POA: Diagnosis not present

## 2021-06-13 DIAGNOSIS — M810 Age-related osteoporosis without current pathological fracture: Secondary | ICD-10-CM | POA: Diagnosis not present

## 2021-06-13 DIAGNOSIS — H3530 Unspecified macular degeneration: Secondary | ICD-10-CM | POA: Diagnosis not present

## 2021-06-13 DIAGNOSIS — J449 Chronic obstructive pulmonary disease, unspecified: Secondary | ICD-10-CM | POA: Diagnosis not present

## 2021-06-13 DIAGNOSIS — N39498 Other specified urinary incontinence: Secondary | ICD-10-CM | POA: Diagnosis not present

## 2021-06-13 DIAGNOSIS — G309 Alzheimer's disease, unspecified: Secondary | ICD-10-CM | POA: Diagnosis not present

## 2021-06-13 DIAGNOSIS — M170 Bilateral primary osteoarthritis of knee: Secondary | ICD-10-CM | POA: Diagnosis not present

## 2021-06-13 DIAGNOSIS — N39490 Overflow incontinence: Secondary | ICD-10-CM | POA: Diagnosis not present

## 2021-06-13 DIAGNOSIS — N401 Enlarged prostate with lower urinary tract symptoms: Secondary | ICD-10-CM | POA: Diagnosis not present

## 2021-06-13 DIAGNOSIS — R69 Illness, unspecified: Secondary | ICD-10-CM | POA: Diagnosis not present

## 2021-06-13 DIAGNOSIS — I10 Essential (primary) hypertension: Secondary | ICD-10-CM | POA: Diagnosis not present

## 2021-06-17 DIAGNOSIS — H3530 Unspecified macular degeneration: Secondary | ICD-10-CM | POA: Diagnosis not present

## 2021-06-17 DIAGNOSIS — M170 Bilateral primary osteoarthritis of knee: Secondary | ICD-10-CM | POA: Diagnosis not present

## 2021-06-17 DIAGNOSIS — G309 Alzheimer's disease, unspecified: Secondary | ICD-10-CM | POA: Diagnosis not present

## 2021-06-17 DIAGNOSIS — J449 Chronic obstructive pulmonary disease, unspecified: Secondary | ICD-10-CM | POA: Diagnosis not present

## 2021-06-17 DIAGNOSIS — N39498 Other specified urinary incontinence: Secondary | ICD-10-CM | POA: Diagnosis not present

## 2021-06-17 DIAGNOSIS — I10 Essential (primary) hypertension: Secondary | ICD-10-CM | POA: Diagnosis not present

## 2021-06-17 DIAGNOSIS — N401 Enlarged prostate with lower urinary tract symptoms: Secondary | ICD-10-CM | POA: Diagnosis not present

## 2021-06-17 DIAGNOSIS — M810 Age-related osteoporosis without current pathological fracture: Secondary | ICD-10-CM | POA: Diagnosis not present

## 2021-06-17 DIAGNOSIS — N39490 Overflow incontinence: Secondary | ICD-10-CM | POA: Diagnosis not present

## 2021-06-17 DIAGNOSIS — R69 Illness, unspecified: Secondary | ICD-10-CM | POA: Diagnosis not present

## 2021-06-25 DIAGNOSIS — M810 Age-related osteoporosis without current pathological fracture: Secondary | ICD-10-CM | POA: Diagnosis not present

## 2021-06-25 DIAGNOSIS — M170 Bilateral primary osteoarthritis of knee: Secondary | ICD-10-CM | POA: Diagnosis not present

## 2021-06-25 DIAGNOSIS — G309 Alzheimer's disease, unspecified: Secondary | ICD-10-CM | POA: Diagnosis not present

## 2021-06-25 DIAGNOSIS — R69 Illness, unspecified: Secondary | ICD-10-CM | POA: Diagnosis not present

## 2021-06-25 DIAGNOSIS — I10 Essential (primary) hypertension: Secondary | ICD-10-CM | POA: Diagnosis not present

## 2021-06-25 DIAGNOSIS — H3530 Unspecified macular degeneration: Secondary | ICD-10-CM | POA: Diagnosis not present

## 2021-06-25 DIAGNOSIS — N39498 Other specified urinary incontinence: Secondary | ICD-10-CM | POA: Diagnosis not present

## 2021-06-25 DIAGNOSIS — J449 Chronic obstructive pulmonary disease, unspecified: Secondary | ICD-10-CM | POA: Diagnosis not present

## 2021-06-25 DIAGNOSIS — N401 Enlarged prostate with lower urinary tract symptoms: Secondary | ICD-10-CM | POA: Diagnosis not present

## 2021-06-25 DIAGNOSIS — N39490 Overflow incontinence: Secondary | ICD-10-CM | POA: Diagnosis not present

## 2021-07-08 ENCOUNTER — Ambulatory Visit: Payer: Medicare HMO | Admitting: Podiatry

## 2021-07-08 ENCOUNTER — Encounter: Payer: Self-pay | Admitting: Podiatry

## 2021-07-08 ENCOUNTER — Other Ambulatory Visit: Payer: Self-pay

## 2021-07-08 DIAGNOSIS — M79675 Pain in left toe(s): Secondary | ICD-10-CM | POA: Diagnosis not present

## 2021-07-08 DIAGNOSIS — M79674 Pain in right toe(s): Secondary | ICD-10-CM | POA: Diagnosis not present

## 2021-07-08 DIAGNOSIS — B351 Tinea unguium: Secondary | ICD-10-CM

## 2021-07-11 DIAGNOSIS — N401 Enlarged prostate with lower urinary tract symptoms: Secondary | ICD-10-CM | POA: Diagnosis not present

## 2021-07-11 DIAGNOSIS — J449 Chronic obstructive pulmonary disease, unspecified: Secondary | ICD-10-CM | POA: Diagnosis not present

## 2021-07-11 DIAGNOSIS — R69 Illness, unspecified: Secondary | ICD-10-CM | POA: Diagnosis not present

## 2021-07-11 DIAGNOSIS — N39490 Overflow incontinence: Secondary | ICD-10-CM | POA: Diagnosis not present

## 2021-07-11 DIAGNOSIS — N39498 Other specified urinary incontinence: Secondary | ICD-10-CM | POA: Diagnosis not present

## 2021-07-11 DIAGNOSIS — G309 Alzheimer's disease, unspecified: Secondary | ICD-10-CM | POA: Diagnosis not present

## 2021-07-11 DIAGNOSIS — H3530 Unspecified macular degeneration: Secondary | ICD-10-CM | POA: Diagnosis not present

## 2021-07-11 DIAGNOSIS — M810 Age-related osteoporosis without current pathological fracture: Secondary | ICD-10-CM | POA: Diagnosis not present

## 2021-07-11 DIAGNOSIS — M170 Bilateral primary osteoarthritis of knee: Secondary | ICD-10-CM | POA: Diagnosis not present

## 2021-07-11 DIAGNOSIS — I10 Essential (primary) hypertension: Secondary | ICD-10-CM | POA: Diagnosis not present

## 2021-07-14 DIAGNOSIS — I10 Essential (primary) hypertension: Secondary | ICD-10-CM | POA: Diagnosis not present

## 2021-07-14 DIAGNOSIS — M810 Age-related osteoporosis without current pathological fracture: Secondary | ICD-10-CM | POA: Diagnosis not present

## 2021-07-14 DIAGNOSIS — J449 Chronic obstructive pulmonary disease, unspecified: Secondary | ICD-10-CM | POA: Diagnosis not present

## 2021-07-14 DIAGNOSIS — M170 Bilateral primary osteoarthritis of knee: Secondary | ICD-10-CM | POA: Diagnosis not present

## 2021-07-14 DIAGNOSIS — R69 Illness, unspecified: Secondary | ICD-10-CM | POA: Diagnosis not present

## 2021-07-14 DIAGNOSIS — H3530 Unspecified macular degeneration: Secondary | ICD-10-CM | POA: Diagnosis not present

## 2021-07-14 DIAGNOSIS — N39490 Overflow incontinence: Secondary | ICD-10-CM | POA: Diagnosis not present

## 2021-07-14 DIAGNOSIS — N401 Enlarged prostate with lower urinary tract symptoms: Secondary | ICD-10-CM | POA: Diagnosis not present

## 2021-07-14 DIAGNOSIS — N39498 Other specified urinary incontinence: Secondary | ICD-10-CM | POA: Diagnosis not present

## 2021-07-14 DIAGNOSIS — G309 Alzheimer's disease, unspecified: Secondary | ICD-10-CM | POA: Diagnosis not present

## 2021-07-14 NOTE — Progress Notes (Signed)
Subjective: Kevin Page is a 88 y.o. male patient seen today for follow up of  painful elongated mycotic toenails 1-5 bilaterally which are tender when wearing enclosed shoe gear. Pain is relieved with periodic professional debridement.[name]   He is accompanied by his wife on today's visit.  New problem(s)/concern(s) today: None    PCP is Ginger Organ., MD. Last visit was: 03/14/2021.  No Known Allergies  Objective: Physical Exam  General: Patient is a pleasant 88 y.o. Caucasian male WD, WN in NAD. AAO x 3.   Neurovascular Examination: Capillary refill time to digits immediate b/l. Palpable DP pulse(s) b/l LE. Faintly palpable PT pulse(s) b/l LE. Pedal hair absent. No pain with calf compression b/l. Lower extremity skin temperature gradient warm to cool. No edema noted b/l LE. No ischemia or gangrene noted b/l LE. No cyanosis or clubbing noted b/l LE.  Protective sensation intact 5/5 intact bilaterally with 10g monofilament b/l. Vibratory sensation intact b/l.  Dermatological:  Pedal skin thin, shiny and atrophic b/l LE. No open wounds b/l LE. No interdigital macerations noted b/l LE. Toenails 1-5 bilaterally elongated, discolored, dystrophic, thickened, and crumbly with subungual debris and tenderness to dorsal palpation. No hyperkeratotic nor porokeratotic lesions present on today's visit.  Musculoskeletal:  Muscle strength 5/5 to all lower extremity muscle groups bilaterally. HAV with bunion deformity noted b/l LE. Hammertoe deformity noted 2-5 b/l.  Assessment: 1. Pain due to onychomycosis of toenails of both feet    Plan: Patient was evaluated and treated and all questions answered. Consent given for treatment as described below: -Toenails 1-5 b/l were debrided in length and girth with sterile nail nippers and dremel without iatrogenic bleeding.  -Patient/POA to call should there be question/concern in the interim.  Return in about 3 months (around 10/05/2021).  Marzetta Board, DPM

## 2021-07-15 DIAGNOSIS — J449 Chronic obstructive pulmonary disease, unspecified: Secondary | ICD-10-CM | POA: Diagnosis not present

## 2021-07-15 DIAGNOSIS — M810 Age-related osteoporosis without current pathological fracture: Secondary | ICD-10-CM | POA: Diagnosis not present

## 2021-07-15 DIAGNOSIS — E785 Hyperlipidemia, unspecified: Secondary | ICD-10-CM | POA: Diagnosis not present

## 2021-07-15 DIAGNOSIS — I10 Essential (primary) hypertension: Secondary | ICD-10-CM | POA: Diagnosis not present

## 2021-07-21 DIAGNOSIS — I10 Essential (primary) hypertension: Secondary | ICD-10-CM | POA: Diagnosis not present

## 2021-07-21 DIAGNOSIS — N39490 Overflow incontinence: Secondary | ICD-10-CM | POA: Diagnosis not present

## 2021-07-21 DIAGNOSIS — H3530 Unspecified macular degeneration: Secondary | ICD-10-CM | POA: Diagnosis not present

## 2021-07-21 DIAGNOSIS — M170 Bilateral primary osteoarthritis of knee: Secondary | ICD-10-CM | POA: Diagnosis not present

## 2021-07-21 DIAGNOSIS — J449 Chronic obstructive pulmonary disease, unspecified: Secondary | ICD-10-CM | POA: Diagnosis not present

## 2021-07-21 DIAGNOSIS — M810 Age-related osteoporosis without current pathological fracture: Secondary | ICD-10-CM | POA: Diagnosis not present

## 2021-07-21 DIAGNOSIS — N39498 Other specified urinary incontinence: Secondary | ICD-10-CM | POA: Diagnosis not present

## 2021-07-21 DIAGNOSIS — R69 Illness, unspecified: Secondary | ICD-10-CM | POA: Diagnosis not present

## 2021-07-21 DIAGNOSIS — G309 Alzheimer's disease, unspecified: Secondary | ICD-10-CM | POA: Diagnosis not present

## 2021-07-21 DIAGNOSIS — N401 Enlarged prostate with lower urinary tract symptoms: Secondary | ICD-10-CM | POA: Diagnosis not present

## 2021-07-31 ENCOUNTER — Inpatient Hospital Stay (HOSPITAL_COMMUNITY)
Admission: EM | Admit: 2021-07-31 | Discharge: 2021-08-08 | DRG: 312 | Disposition: A | Discharge status: Skilled Nursing Facility | Payer: Medicare HMO | Attending: Internal Medicine | Admitting: Internal Medicine

## 2021-07-31 ENCOUNTER — Encounter (HOSPITAL_COMMUNITY): Payer: Self-pay | Admitting: *Deleted

## 2021-07-31 ENCOUNTER — Emergency Department (HOSPITAL_COMMUNITY): Payer: Medicare HMO

## 2021-07-31 ENCOUNTER — Observation Stay (HOSPITAL_COMMUNITY): Payer: Medicare HMO

## 2021-07-31 ENCOUNTER — Other Ambulatory Visit: Payer: Self-pay

## 2021-07-31 DIAGNOSIS — G459 Transient cerebral ischemic attack, unspecified: Secondary | ICD-10-CM | POA: Diagnosis not present

## 2021-07-31 DIAGNOSIS — E785 Hyperlipidemia, unspecified: Secondary | ICD-10-CM | POA: Diagnosis present

## 2021-07-31 DIAGNOSIS — R55 Syncope and collapse: Secondary | ICD-10-CM | POA: Diagnosis not present

## 2021-07-31 DIAGNOSIS — R4189 Other symptoms and signs involving cognitive functions and awareness: Secondary | ICD-10-CM | POA: Diagnosis present

## 2021-07-31 DIAGNOSIS — K922 Gastrointestinal hemorrhage, unspecified: Secondary | ICD-10-CM | POA: Diagnosis present

## 2021-07-31 DIAGNOSIS — R4182 Altered mental status, unspecified: Secondary | ICD-10-CM | POA: Diagnosis not present

## 2021-07-31 DIAGNOSIS — I248 Other forms of acute ischemic heart disease: Secondary | ICD-10-CM | POA: Diagnosis present

## 2021-07-31 DIAGNOSIS — J9811 Atelectasis: Secondary | ICD-10-CM | POA: Diagnosis not present

## 2021-07-31 DIAGNOSIS — I10 Essential (primary) hypertension: Secondary | ICD-10-CM | POA: Diagnosis present

## 2021-07-31 DIAGNOSIS — D649 Anemia, unspecified: Secondary | ICD-10-CM | POA: Diagnosis not present

## 2021-07-31 DIAGNOSIS — R778 Other specified abnormalities of plasma proteins: Secondary | ICD-10-CM

## 2021-07-31 DIAGNOSIS — Z981 Arthrodesis status: Secondary | ICD-10-CM

## 2021-07-31 DIAGNOSIS — Z87891 Personal history of nicotine dependence: Secondary | ICD-10-CM

## 2021-07-31 DIAGNOSIS — Z66 Do not resuscitate: Secondary | ICD-10-CM | POA: Diagnosis present

## 2021-07-31 DIAGNOSIS — E538 Deficiency of other specified B group vitamins: Secondary | ICD-10-CM | POA: Diagnosis present

## 2021-07-31 DIAGNOSIS — Z91199 Patient's noncompliance with other medical treatment and regimen due to unspecified reason: Secondary | ICD-10-CM

## 2021-07-31 DIAGNOSIS — R410 Disorientation, unspecified: Secondary | ICD-10-CM | POA: Diagnosis not present

## 2021-07-31 DIAGNOSIS — R69 Illness, unspecified: Secondary | ICD-10-CM | POA: Diagnosis not present

## 2021-07-31 DIAGNOSIS — I951 Orthostatic hypotension: Secondary | ICD-10-CM | POA: Diagnosis not present

## 2021-07-31 DIAGNOSIS — Z20822 Contact with and (suspected) exposure to covid-19: Secondary | ICD-10-CM | POA: Diagnosis present

## 2021-07-31 DIAGNOSIS — Z8673 Personal history of transient ischemic attack (TIA), and cerebral infarction without residual deficits: Secondary | ICD-10-CM

## 2021-07-31 DIAGNOSIS — Z79899 Other long term (current) drug therapy: Secondary | ICD-10-CM

## 2021-07-31 DIAGNOSIS — Z7982 Long term (current) use of aspirin: Secondary | ICD-10-CM

## 2021-07-31 DIAGNOSIS — L259 Unspecified contact dermatitis, unspecified cause: Secondary | ICD-10-CM | POA: Diagnosis present

## 2021-07-31 DIAGNOSIS — D62 Acute posthemorrhagic anemia: Secondary | ICD-10-CM | POA: Diagnosis present

## 2021-07-31 DIAGNOSIS — F0390 Unspecified dementia without behavioral disturbance: Secondary | ICD-10-CM | POA: Diagnosis present

## 2021-07-31 DIAGNOSIS — R1310 Dysphagia, unspecified: Secondary | ICD-10-CM | POA: Diagnosis not present

## 2021-07-31 DIAGNOSIS — I959 Hypotension, unspecified: Secondary | ICD-10-CM | POA: Diagnosis not present

## 2021-07-31 DIAGNOSIS — Z743 Need for continuous supervision: Secondary | ICD-10-CM | POA: Diagnosis not present

## 2021-07-31 DIAGNOSIS — N400 Enlarged prostate without lower urinary tract symptoms: Secondary | ICD-10-CM | POA: Diagnosis present

## 2021-07-31 DIAGNOSIS — R5381 Other malaise: Secondary | ICD-10-CM

## 2021-07-31 DIAGNOSIS — Z800 Family history of malignant neoplasm of digestive organs: Secondary | ICD-10-CM

## 2021-07-31 LAB — IRON AND TIBC
Iron: 68 ug/dL (ref 45–182)
Saturation Ratios: 29 % (ref 17.9–39.5)
TIBC: 232 ug/dL — ABNORMAL LOW (ref 250–450)
UIBC: 164 ug/dL

## 2021-07-31 LAB — ECHOCARDIOGRAM COMPLETE
AR max vel: 2.52 cm2
AV Peak grad: 8 mmHg
Ao pk vel: 1.41 m/s
Area-P 1/2: 3.7 cm2
Calc EF: 65.1 %
Height: 70 in
P 1/2 time: 590 msec
S' Lateral: 2.1 cm
Single Plane A2C EF: 65.4 %
Single Plane A4C EF: 65.7 %
Weight: 2320 oz

## 2021-07-31 LAB — CBC WITH DIFFERENTIAL/PLATELET
Abs Immature Granulocytes: 0.06 10*3/uL (ref 0.00–0.07)
Basophils Absolute: 0 10*3/uL (ref 0.0–0.1)
Basophils Relative: 0 %
Eosinophils Absolute: 0 10*3/uL (ref 0.0–0.5)
Eosinophils Relative: 0 %
HCT: 27.6 % — ABNORMAL LOW (ref 39.0–52.0)
Hemoglobin: 9.1 g/dL — ABNORMAL LOW (ref 13.0–17.0)
Immature Granulocytes: 0 %
Lymphocytes Relative: 9 %
Lymphs Abs: 1.2 10*3/uL (ref 0.7–4.0)
MCH: 31.4 pg (ref 26.0–34.0)
MCHC: 33 g/dL (ref 30.0–36.0)
MCV: 95.2 fL (ref 80.0–100.0)
Monocytes Absolute: 0.9 10*3/uL (ref 0.1–1.0)
Monocytes Relative: 7 %
Neutro Abs: 11.1 10*3/uL — ABNORMAL HIGH (ref 1.7–7.7)
Neutrophils Relative %: 84 %
Platelets: 237 10*3/uL (ref 150–400)
RBC: 2.9 MIL/uL — ABNORMAL LOW (ref 4.22–5.81)
RDW: 13.7 % (ref 11.5–15.5)
WBC: 13.4 10*3/uL — ABNORMAL HIGH (ref 4.0–10.5)
nRBC: 0 % (ref 0.0–0.2)

## 2021-07-31 LAB — CBC
HCT: 28 % — ABNORMAL LOW (ref 39.0–52.0)
HCT: 25.4 % — ABNORMAL LOW (ref 39.0–52.0)
Hemoglobin: 9 g/dL — ABNORMAL LOW (ref 13.0–17.0)
Hemoglobin: 8.3 g/dL — ABNORMAL LOW (ref 13.0–17.0)
MCH: 30.9 pg (ref 26.0–34.0)
MCH: 30.9 pg (ref 26.0–34.0)
MCHC: 32.1 g/dL (ref 30.0–36.0)
MCHC: 32.7 g/dL (ref 30.0–36.0)
MCV: 96.2 fL (ref 80.0–100.0)
MCV: 94.4 fL (ref 80.0–100.0)
Platelets: 245 10*3/uL (ref 150–400)
Platelets: 236 10*3/uL (ref 150–400)
RBC: 2.91 MIL/uL — ABNORMAL LOW (ref 4.22–5.81)
RBC: 2.69 MIL/uL — ABNORMAL LOW (ref 4.22–5.81)
RDW: 13.8 % (ref 11.5–15.5)
RDW: 13.9 % (ref 11.5–15.5)
WBC: 15.6 10*3/uL — ABNORMAL HIGH (ref 4.0–10.5)
WBC: 10.6 10*3/uL — ABNORMAL HIGH (ref 4.0–10.5)
nRBC: 0 % (ref 0.0–0.2)
nRBC: 0 % (ref 0.0–0.2)

## 2021-07-31 LAB — LACTIC ACID, PLASMA
Lactic Acid, Venous: 1.7 mmol/L (ref 0.5–1.9)
Lactic Acid, Venous: 1.6 mmol/L (ref 0.5–1.9)

## 2021-07-31 LAB — COMPREHENSIVE METABOLIC PANEL
ALT: 12 U/L (ref 0–44)
AST: 22 U/L (ref 15–41)
Albumin: 3 g/dL — ABNORMAL LOW (ref 3.5–5.0)
Alkaline Phosphatase: 42 U/L (ref 38–126)
Anion gap: 8 (ref 5–15)
BUN: 41 mg/dL — ABNORMAL HIGH (ref 8–23)
CO2: 26 mmol/L (ref 22–32)
Calcium: 8 mg/dL — ABNORMAL LOW (ref 8.9–10.3)
Chloride: 103 mmol/L (ref 98–111)
Creatinine, Ser: 0.81 mg/dL (ref 0.61–1.24)
GFR, Estimated: >60 mL/min (ref >60)
Glucose, Bld: 105 mg/dL — ABNORMAL HIGH (ref 70–99)
Potassium: 3.8 mmol/L (ref 3.5–5.1)
Sodium: 137 mmol/L (ref 135–145)
Total Bilirubin: 0.6 mg/dL (ref 0.3–1.2)
Total Protein: 5 g/dL — ABNORMAL LOW (ref 6.5–8.1)

## 2021-07-31 LAB — RESP PANEL BY RT-PCR (FLU A&B, COVID) ARPGX2
Influenza A by PCR: NEGATIVE
Influenza B by PCR: NEGATIVE
SARS Coronavirus 2 by RT PCR: NEGATIVE

## 2021-07-31 LAB — FOLATE: Folate: 14.4 ng/mL (ref >5.9)

## 2021-07-31 LAB — ABO/RH: ABO/RH(D): A POS

## 2021-07-31 LAB — URINALYSIS, ROUTINE W REFLEX MICROSCOPIC
Bilirubin Urine: NEGATIVE
Glucose, UA: NEGATIVE mg/dL
Hgb urine dipstick: NEGATIVE
Ketones, ur: NEGATIVE mg/dL
Leukocytes,Ua: NEGATIVE
Nitrite: NEGATIVE
Protein, ur: NEGATIVE mg/dL
Specific Gravity, Urine: 1.019 (ref 1.005–1.030)
pH: 5 (ref 5.0–8.0)

## 2021-07-31 LAB — CBG MONITORING, ED: Glucose-Capillary: 76 mg/dL (ref 70–99)

## 2021-07-31 LAB — RETICULOCYTES
Immature Retic Fract: 14.1 % (ref 2.3–15.9)
RBC.: 2.92 MIL/uL — ABNORMAL LOW (ref 4.22–5.81)
Retic Count, Absolute: 47.9 10*3/uL (ref 19.0–186.0)
Retic Ct Pct: 1.6 % (ref 0.4–3.1)

## 2021-07-31 LAB — TROPONIN I (HIGH SENSITIVITY)
Troponin I (High Sensitivity): 30 ng/L — ABNORMAL HIGH (ref <18)
Troponin I (High Sensitivity): 37 ng/L — ABNORMAL HIGH (ref <18)
Troponin I (High Sensitivity): 47 ng/L — ABNORMAL HIGH (ref <18)

## 2021-07-31 LAB — POC OCCULT BLOOD, ED: Fecal Occult Bld: NEGATIVE

## 2021-07-31 LAB — VITAMIN B12: Vitamin B-12: 214 pg/mL (ref 180–914)

## 2021-07-31 LAB — FERRITIN: Ferritin: 33 ng/mL (ref 24–336)

## 2021-07-31 MED ORDER — MIRABEGRON ER 25 MG PO TB24
25.0000 mg | ORAL_TABLET | Freq: Every day | ORAL | Status: DC
  Administered 2021-07-31 – 2021-08-02 (×3): 25 mg via ORAL
  Filled 2021-07-31 (×4): qty 1

## 2021-07-31 MED ORDER — VITAMIN B-12 1000 MCG PO TABS
1000.0000 ug | ORAL_TABLET | Freq: Every day | ORAL | Status: DC
  Administered 2021-07-31 – 2021-08-08 (×9): 1000 ug via ORAL
  Filled 2021-07-31 (×9): qty 1

## 2021-07-31 MED ORDER — PANTOPRAZOLE SODIUM 40 MG IV SOLR
40.0000 mg | Freq: Two times a day (BID) | INTRAVENOUS | Status: DC
  Administered 2021-07-31 – 2021-08-03 (×7): 40 mg via INTRAVENOUS
  Filled 2021-07-31 (×7): qty 10

## 2021-07-31 MED ORDER — DUTASTERIDE 0.5 MG PO CAPS
0.5000 mg | ORAL_CAPSULE | Freq: Every day | ORAL | Status: DC
  Administered 2021-07-31 – 2021-08-07 (×8): 0.5 mg via ORAL
  Filled 2021-07-31 (×10): qty 1

## 2021-07-31 MED ORDER — LORAZEPAM 2 MG/ML IJ SOLN
1.0000 mg | Freq: Once | INTRAMUSCULAR | Status: AC
  Administered 2021-08-01: 1 mg via INTRAVENOUS
  Filled 2021-07-31: qty 1

## 2021-07-31 MED ORDER — ACETAMINOPHEN 325 MG PO TABS: 650.0000 mg | ORAL_TABLET | Freq: Four times a day (QID) | ORAL | Status: DC | PRN

## 2021-07-31 MED ORDER — SODIUM CHLORIDE 0.9% FLUSH
3.0000 mL | Freq: Two times a day (BID) | INTRAVENOUS | Status: DC
  Administered 2021-07-31 – 2021-08-04 (×8): 3 mL via INTRAVENOUS

## 2021-07-31 MED ORDER — SODIUM CHLORIDE 0.9 % IV SOLN: INTRAVENOUS | Status: DC

## 2021-07-31 MED ORDER — GUAIFENESIN ER 600 MG PO TB12
600.0000 mg | ORAL_TABLET | Freq: Every day | ORAL | Status: DC
  Administered 2021-07-31 – 2021-08-08 (×9): 600 mg via ORAL
  Filled 2021-07-31 (×9): qty 1

## 2021-07-31 MED ORDER — ACETAMINOPHEN 650 MG RE SUPP: 650.0000 mg | Freq: Four times a day (QID) | RECTAL | Status: DC | PRN

## 2021-07-31 NOTE — ED Notes (Signed)
Patient transported to CT 

## 2021-07-31 NOTE — ED Notes (Addendum)
ED TO INPATIENT HANDOFF REPORT  ED Nurse Name and Phone #: Kiera Hussey RN [phone]  S Name/Age/Gender Kevin Page 88 y.o. male Room/Bed: 042C/042C  Code Status   Code Status: DNR  Home/SNF/Other Home Patient oriented to: self, place, time, and situation Intermittent confusion, repeats certain questions. Is this baseline? Yes   Triage Complete: Triage complete  Chief Complaint Syncope [R55]  Triage Note BIB [facility] s/p syncope. Syncope prior to EMS, and with EMS. Normally ambulates, but was unable to walk to b/r this am. Mentions UTI sx over last 2d. H/o MCI, and TIA. Stroke scale negative. Denies pain. CBG 140. BP was 98/58, and improved after NS 500cc IVF bolus. Lives with wife. Alert, NAD, calm, interactive, some confusion. NSL 18g L AC on arrival.     Allergies No Known Allergies  Level of Care/Admitting Diagnosis ED Disposition     ED Disposition  Admit   Condition  --   Comment  Hospital Area: [facility] [[id number]]  Level of Care: Telemetry Medical [104]  May place patient in observation at [facility] or [facility] if equivalent level of care is available:: No  Covid Evaluation: Asymptomatic - no recent exposure (last 10 days) testing not required  Diagnosis: Syncope [206001]  Admitting Physician: Orland Mustard [[id number]]  Attending Physician: Orland Mustard [[id number]]          B Medical/Surgery History Past Medical History:  Diagnosis Date   Allergy    dust and mites   BPH (benign prostatic hyperplasia)    Essential hypertension    Hyperlipidemia    Macular degeneration    Osteoarthritis of both knees    Sinus bradycardia    SVT (supraventricular tachycardia) (HCC)    a. Event monitor 2014: few episodes of SVT, longest 12 beats with rate 128.   Syncope    Past Surgical History:  Procedure Laterality Date   CATARACT EXTRACTION W/ INTRAOCULAR LENS  IMPLANT, BILATERAL Bilateral 05/18/2009   dupytron contractions  1998 , 2006   both  hands   gi bleeding     LUMBAR FUSION  05/18/1992     A IV Location/Drains/Wounds Patient Lines/Drains/Airways Status     Active Line/Drains/Airways     Name Placement date Placement time Site Days   Peripheral IV 07/31/21 18 G 1" Left Antecubital 07/31/21  1006  Antecubital  less than 1   Peripheral IV 07/31/21 20 G 1" Left;Posterior Forearm 07/31/21  1022  Forearm  less than 1            Intake/Output Last 24 hours  Intake/Output Summary (Last 24 hours) at 07/31/2021 1540 Last data filed at 07/31/2021 1535 Gross per 24 hour  Intake 505.17 ml  Output --  Net 505.17 ml    Labs/Imaging Results for orders placed or performed during the hospital encounter of 07/31/21 (from the past 48 hour(s))  Lactic acid, plasma     Status: None   Collection Time: 07/31/21 10:05 AM  Result Value Ref Range   Lactic Acid, Venous 1.7 0.5 - 1.9 mmol/L    Comment: Performed at [facility], [address], [place], [place] [address]  Comprehensive metabolic panel     Status: Abnormal   Collection Time: 07/31/21 10:05 AM  Result Value Ref Range   Sodium 137 135 - 145 mmol/L   Potassium 3.8 3.5 - 5.1 mmol/L   Chloride 103 98 - 111 mmol/L   CO2 26 22 - 32 mmol/L   Glucose, Bld 105 (H)  70 - 99 mg/dL    Comment: Glucose reference range applies only to samples taken after fasting for at least 8 hours.   BUN 41 (H) 8 - 23 mg/dL   Creatinine, Ser 9.60 0.61 - 1.24 mg/dL   Calcium 8.0 (L) 8.9 - 10.3 mg/dL   Total Protein 5.0 (L) 6.5 - 8.1 g/dL   Albumin 3.0 (L) 3.5 - 5.0 g/dL   AST 22 15 - 41 U/L   ALT 12 0 - 44 U/L   Alkaline Phosphatase 42 38 - 126 U/L   Total Bilirubin 0.6 0.3 - 1.2 mg/dL   GFR, Estimated >45 >40 mL/min    Comment: (NOTE) Calculated using the CKD-EPI Creatinine Equation (2021)    Anion gap 8 5 - 15    Comment: Performed at [facility], [address], [place], [place] [address]  CBC with Differential     Status: Abnormal   Collection Time: 07/31/21 10:05  AM  Result Value Ref Range   WBC 13.4 (H) 4.0 - 10.5 K/uL   RBC 2.90 (L) 4.22 - 5.81 MIL/uL   Hemoglobin 9.1 (L) 13.0 - 17.0 g/dL   HCT 14.7 (L) 82.9 - 56.2 %   MCV 95.2 80.0 - 100.0 fL   MCH 31.4 26.0 - 34.0 pg   MCHC 33.0 30.0 - 36.0 g/dL   RDW 13.0 86.5 - 78.4 %   Platelets 237 150 - 400 K/uL   nRBC 0.0 0.0 - 0.2 %   Neutrophils Relative % 84 %   Neutro Abs 11.1 (H) 1.7 - 7.7 K/uL   Lymphocytes Relative 9 %   Lymphs Abs 1.2 0.7 - 4.0 K/uL   Monocytes Relative 7 %   Monocytes Absolute 0.9 0.1 - 1.0 K/uL   Eosinophils Relative 0 %   Eosinophils Absolute 0.0 0.0 - 0.5 K/uL   Basophils Relative 0 %   Basophils Absolute 0.0 0.0 - 0.1 K/uL   Immature Granulocytes 0 %   Abs Immature Granulocytes 0.06 0.00 - 0.07 K/uL    Comment: Performed at [facility], [address] [place], [place] [address]  Troponin I (High Sensitivity)     Status: Abnormal   Collection Time: 07/31/21 10:05 AM  Result Value Ref Range   Troponin I (High Sensitivity) 30 (H) <18 ng/L    Comment: (NOTE) Elevated high sensitivity troponin I (hsTnI) values and significant  changes across serial measurements may suggest ACS but many other  chronic and acute conditions are known to elevate hsTnI results.  Refer to the "Links" section for chest pain algorithms and additional  guidance. Performed at [facility], [address] [place], [place] [address]   Reticulocytes     Status: Abnormal   Collection Time: 07/31/21 10:05 AM  Result Value Ref Range   Retic Ct Pct 1.6 0.4 - 3.1 %   RBC. 2.92 (L) 4.22 - 5.81 MIL/uL   Retic Count, Absolute 47.9 19.0 - 186.0 K/uL   Immature Retic Fract 14.1 2.3 - 15.9 %    Comment: Performed at [facility], [address] [place], [place] [address]  Resp Panel by RT-PCR (Flu A&B, Covid) Nasopharyngeal Swab     Status: None   Collection Time: 07/31/21 10:10 AM   Specimen: Nasopharyngeal Swab; Nasopharyngeal(NP) swabs in vial transport medium  Result Value  Ref Range   SARS Coronavirus 2 by RT PCR NEGATIVE NEGATIVE    Comment: (NOTE) SARS-CoV-2 target nucleic acids are NOT DETECTED.  The SARS-CoV-2 RNA is generally detectable in upper respiratory specimens during the acute phase of infection. The lowest concentration of SARS-CoV-2 viral copies this assay can detect is 138 copies/mL. A negative result does not preclude SARS-Cov-2 infection and should not be used as the sole basis for treatment or other patient management decisions. A negative result may occur with  improper specimen collection/handling, submission of specimen other than nasopharyngeal swab, presence of viral mutation(s) within the areas targeted by this assay, and inadequate number of viral copies(<138 copies/mL). A negative result must be combined with clinical observations, patient history, and epidemiological information. The expected result is Negative.  Fact Sheet for Patients:  BloggerCourse.com  Fact Sheet for Healthcare Providers:  SeriousBroker.it  This test is no t yet approved or cleared by the [place] FDA and  has been authorized for detection and/or diagnosis of SARS-CoV-2 by FDA under an Emergency Use Authorization (EUA). This EUA will remain  in effect (meaning this test can be used) for the duration of the COVID-19 declaration under Section 564(b)(1) of the Act, 21 U.S.C.section 360bbb-3(b)(1), unless the authorization is terminated  or revoked sooner.       Influenza A by PCR NEGATIVE NEGATIVE   Influenza B by PCR NEGATIVE NEGATIVE    Comment: (NOTE) The Xpert Xpress SARS-CoV-2/FLU/RSV plus assay is intended as an aid in the diagnosis of influenza from Nasopharyngeal swab specimens and should not be used as a sole basis for treatment. Nasal washings and aspirates are unacceptable for Xpert Xpress SARS-CoV-2/FLU/RSV testing.  Fact Sheet for  Patients: BloggerCourse.com  Fact Sheet for Healthcare Providers: SeriousBroker.it  This test is not yet approved or cleared by the [place] FDA and has been authorized for detection and/or diagnosis of SARS-CoV-2 by FDA under an Emergency Use Authorization (EUA). This EUA will remain in effect (meaning this test can be used) for the duration of the COVID-19 declaration under Section 564(b)(1) of the Act, 21 U.S.C. section 360bbb-3(b)(1), unless the authorization is terminated or revoked.  Performed at [facility], [address], [place], [place] [address]   Vitamin B12     Status: None   Collection Time: 07/31/21 10:10 AM  Result Value Ref Range   Vitamin B-12 214 180 - 914 pg/mL    Comment: (NOTE) This assay is not validated for testing neonatal or myeloproliferative syndrome specimens for Vitamin B12 levels. Performed at [facility], [address], [place], [place] [address]   Iron and TIBC     Status: Abnormal   Collection Time: 07/31/21 10:10 AM  Result Value Ref Range   Iron 68 45 - 182 ug/dL   TIBC 621 (L) 308 - 657 ug/dL   Saturation Ratios 29 17.9 - 39.5 %   UIBC 164 ug/dL    Comment: Performed at [facility], [address], [place], [place] [address]  Ferritin     Status: None   Collection Time: 07/31/21 10:10 AM  Result Value Ref Range   Ferritin 33 24 - 336 ng/mL    Comment: Performed at [facility], [address], [place], [place] [address]  CBG monitoring, ED     Status: None   Collection Time: 07/31/21 10:17 AM  Result Value Ref Range   Glucose-Capillary 76 70 - 99 mg/dL    Comment: Glucose reference range applies only to samples taken after fasting for at least 8 hours.  Folate     Status: None   Collection Time:  07/31/21 10:40 AM  Result Value Ref Range   Folate 14.4 >5.9 ng/mL    Comment: Performed at [facility], [address], [place], [place] [address]   POC occult blood, ED     Status: None   Collection Time: 07/31/21 10:41 AM  Result Value Ref Range   Fecal Occult Bld NEGATIVE NEGATIVE  ABO/Rh     Status: None   Collection Time: 07/31/21 10:55 AM  Result Value Ref Range   ABO/RH(D)      A POS Performed at [facility], [address] [place], [place] [address]   Lactic acid, plasma     Status: None   Collection Time: 07/31/21 12:10 PM  Result Value Ref Range   Lactic Acid, Venous 1.6 0.5 - 1.9 mmol/L    Comment: Performed at [facility], [address] [place], [place] [address]  Type and screen Ordered by PROVIDER DEFAULT     Status: None   Collection Time: 07/31/21 12:10 PM  Result Value Ref Range   ABO/RH(D) A POS    Antibody Screen NEG    Sample Expiration      08/03/2021,2359 Performed at [facility], [address], [place], [place] [address]   Urinalysis, Routine w reflex microscopic Urine, Clean Catch     Status: None   Collection Time: 07/31/21  1:00 PM  Result Value Ref Range   Color, Urine YELLOW YELLOW   APPearance CLEAR CLEAR   Specific Gravity, Urine 1.019 1.005 - 1.030   pH 5.0 5.0 - 8.0   Glucose, UA NEGATIVE NEGATIVE mg/dL   Hgb urine dipstick NEGATIVE NEGATIVE   Bilirubin Urine NEGATIVE NEGATIVE   Ketones, ur NEGATIVE NEGATIVE mg/dL   Protein, ur NEGATIVE NEGATIVE mg/dL   Nitrite NEGATIVE NEGATIVE   Leukocytes,Ua NEGATIVE NEGATIVE    Comment: Performed at [facility], [address] [place], [place] [address]  Troponin I (High Sensitivity)     Status: Abnormal   Collection Time: 07/31/21  1:48 PM  Result Value Ref Range   Troponin I (High Sensitivity) 37 (H) <18 ng/L    Comment: (NOTE) Elevated high sensitivity troponin I (hsTnI) values and significant  changes across serial measurements may suggest ACS but many other  chronic and acute conditions are known to elevate hsTnI results.  Refer to the "Links" section for chest pain algorithms and additional   guidance. Performed at [facility], [address] [place], [place] [address]   CBC     Status: Abnormal   Collection Time: 07/31/21  1:49 PM  Result Value Ref Range   WBC 15.6 (H) 4.0 - 10.5 K/uL   RBC 2.91 (L) 4.22 - 5.81 MIL/uL   Hemoglobin 9.0 (L) 13.0 - 17.0 g/dL   HCT 36.6 (L) 44.0 - 34.7 %   MCV 96.2 80.0 - 100.0 fL   MCH 30.9 26.0 - 34.0 pg   MCHC 32.1 30.0 - 36.0 g/dL   RDW 42.5 95.6 - 38.7 %   Platelets 245 150 - 400 K/uL   nRBC 0.0 0.0 - 0.2 %    Comment: Performed at [facility], [address], [place], [place] [address]   CT Head Wo Contrast  Result Date: 07/31/2021 CLINICAL DATA:  Altered mental status EXAM: CT HEAD WITHOUT CONTRAST TECHNIQUE: Contiguous axial images were obtained from the base of the skull through the vertex without intravenous contrast. RADIATION DOSE REDUCTION: This exam was performed  according to the departmental dose-optimization program which includes automated exposure control, adjustment of the mA and/or kV according to patient size and/or use of iterative reconstruction technique. COMPARISON:  CT head 04/25/2021 FINDINGS: Brain: No acute intracranial hemorrhage, mass effect, or herniation. No extra-axial fluid collections. No evidence of acute territorial infarct. No hydrocephalus. Moderate cortical volume loss. Patchy hypodensities in the periventricular and subcortical white matter, likely secondary to chronic microvascular ischemic changes. Vascular: No hyperdense vessel or unexpected calcification. Skull: Normal. Negative for fracture or focal lesion. Sinuses/Orbits: No acute finding. Other: None. IMPRESSION: Chronic changes with no acute intracranial process identified. Electronically Signed   By: Jannifer Hick M.D.   On: 07/31/2021 11:30   DG Chest Portable 1 View  Result Date: 07/31/2021 CLINICAL DATA:  Syncopal episode. EXAM: PORTABLE CHEST 1 VIEW COMPARISON:  Radiographs 04/25/2021 and 01/25/2021.  CT 08/27/2008.  FINDINGS: 1048 hours. Stable chronic elevation of the left hemidiaphragm with associated chronic left basilar atelectasis. The right lung is clear. There is no pleural effusion or pneumothorax. The heart size and mediastinal contours are stable with aortic atherosclerosis. No acute osseous findings are evident. There are signs of chronic rotator cuff tears and advanced glenohumeral arthropathy bilaterally. Telemetry leads overlie the chest. IMPRESSION: Stable chest without evidence of acute cardiopulmonary process. Electronically Signed   By: Carey Bullocks M.D.   On: 07/31/2021 10:55    Pending Labs Unresulted Labs (From admission, onward)     Start     Ordered   08/01/21 0500  Basic metabolic panel  Tomorrow morning,   R        07/31/21 1351   07/31/21 1349  CBC  Now then every 6 hours,   R (with TIMED occurrences)      07/31/21 1348   Unscheduled  Occult blood card to lab, stool RN will collect  As needed,   R     Question:  Specimen to be collected by:  Answer:  RN will collect   07/31/21 1349            Vitals/Pain Today's Vitals   07/31/21 1400 07/31/21 1445 07/31/21 1515 07/31/21 1530  BP: (!) 142/86 118/69 126/67 128/79  Pulse: 80 76 66 83  Resp:    18  Temp:      TempSrc:      SpO2: 100% 100% 100% 100%  Weight:      Height:      PainSc:        Isolation Precautions No active isolations  Medications Medications  0.9 %  sodium chloride infusion ( Intravenous Rate/Dose Change 07/31/21 1536)  dutasteride (AVODART) capsule 0.5 mg (has no administration in time range)  mirabegron ER (MYRBETRIQ) tablet 25 mg (has no administration in time range)  guaiFENesin (MUCINEX) 12 hr tablet 600 mg (600 mg Oral Given 07/31/21 1526)  pantoprazole (PROTONIX) injection 40 mg (40 mg Intravenous Given 07/31/21 1527)  sodium chloride flush (NS) 0.9 % injection 3 mL (3 mLs Intravenous Not Given 07/31/21 1418)  acetaminophen (TYLENOL) tablet 650 mg (has no administration in time range)     Or  acetaminophen (TYLENOL) suppository 650 mg (has no administration in time range)  vitamin B-12 (CYANOCOBALAMIN) tablet 1,000 mcg (1,000 mcg Oral Given 07/31/21 1526)    Mobility walks with assistance, baseline ambulatory.  High fall risk    R Recommendations: See Admitting Provider Note  Report given to: Kennith Center RN

## 2021-07-31 NOTE — ED Provider Notes (Signed)
?[facility] ?Provider Note ? ? ?CSN: [id number] ?Arrival date & time: 07/31/21  6712 ? ?  ? ?History ? ?Chief Complaint  ?Patient presents with  ? Loss of Consciousness  ? ? ?Kevin Page is a 88 y.o. male. ? ? ?Loss of Consciousness ?Associated symptoms: no fever   ? ?Patient has a history of BPH, macular degeneration, hypertension, syncope, dementia, GI bleeding who presents to the ED for evaluation of confusion and weakness and altered mental status.  He was provided by the patient's wife as well as the patient.  Patient himself does not recall being confused.  He has no memory of feeling lightheaded or dizzy.  He does not recall being weak this morning.  His wife states throughout the night he did have to get up several times to go to the bathroom.  He was up at least 3 times.  This morning when she was trying to help him to the bathroom he became unresponsive.  He did not answer any questions.  This lasted for few minutes.  EMS was called.  When they were transferring him he had an episode of stiffness where he became rigid.  Wife states he has had similar symptoms in the past when he had a TIA. ? ?Patient normally is able to ambulate.  EMS reported the patient appeared to have a syncopal episode.  They did not describe seizure activity ? ?Home Medications ?Prior to Admission medications   ?Medication Sig Start Date End Date Taking? Authorizing Provider  ?aspirin EC 81 MG tablet Take 1 tablet (81 mg total) by mouth daily. Hold for now, resume when ok with GI ?Patient taking differently: Take 81 mg by mouth daily. 01/25/21  Yes Florencia Reasons, MD  ?cholecalciferol (VITAMIN D3) 25 MCG (1000 UNIT) tablet Take 1,000 Units by mouth daily.   Yes [provider]  ?diclofenac Sodium (VOLTAREN) 1 % GEL 2 g 4 (four) times daily as needed (pain). 05/20/21  Yes [provider]  ?donepezil (ARICEPT) 10 MG tablet Take 10 mg by mouth at bedtime. 03/28/17  Yes [provider]  ?dutasteride (AVODART) 0.5 MG capsule Take 0.5 mg by mouth at bedtime.   Yes [provider]  ?guaiFENesin (MUCINEX) 600 MG 12 hr tablet Take 1 tablet (600 mg total) by mouth 2 (two) times daily. ?Patient taking differently: Take 600 mg by mouth daily. 01/25/21  Yes Florencia Reasons, MD  ?MYRBETRIQ 25 MG TB24 tablet Take 25 mg by mouth daily. 06/27/21  Yes [provider]  ?OVER THE COUNTER MEDICATION Take 2 tablets by mouth daily. Eye supplement from Dr.Digby office ( Macular protection complete)   Yes [provider]  ?   ? ?Allergies    ?Patient has no known allergies.   ? ?Review of Systems   ?Review of Systems  ?Constitutional:  Negative for fever.  ?Cardiovascular:  Positive for syncope.  ? ?Physical Exam ?Updated Vital Signs ?BP 112/68   Pulse 82   Temp 98.3 ?F (36.8 ?C) (Oral)   Resp 20   Ht 1.778 m ('5\' 10"'$ )   Wt 65.8 kg   SpO2 93%   BMI 20.81 kg/m?  ?Physical Exam ?Vitals and nursing note reviewed.  ?Constitutional:   ?   General: He is not in acute distress. ?   Appearance: He is well-developed.  ?HENT:  ?   Head: Normocephalic and atraumatic.  ?   Right Ear: External ear normal.  ?   Left Ear: External ear  normal.  ?Eyes:  ?   General: No scleral icterus.    ?   Right eye: No discharge.     ?   Left eye: No discharge.  ?   Conjunctiva/sclera: Conjunctivae normal.  ?Neck:  ?   Trachea: No tracheal deviation.  ?Cardiovascular:  ?   Rate and Rhythm: Normal rate and regular rhythm.  ?Pulmonary:  ?   Effort: Pulmonary effort is normal. No respiratory distress.  ?   Breath sounds: Normal breath sounds. No stridor. No wheezing or rales.  ?Abdominal:  ?   General: Bowel sounds are normal. There is no distension.  ?   Palpations: Abdomen is soft.  ?   Tenderness: There is no abdominal tenderness. There is no guarding or rebound.  ?Genitourinary: ?   Comments: Brown stool ?Musculoskeletal:     ?   General: No tenderness or deformity.  ?   Cervical back: Neck supple.  ?Skin: ?    General: Skin is warm and dry.  ?   Findings: No rash.  ?Neurological:  ?   General: No focal deficit present.  ?   Mental Status: He is alert and oriented to person, place, and time.  ?   GCS: GCS eye subscore is 4. GCS verbal subscore is 4. GCS motor subscore is 6.  ?   Cranial Nerves: No cranial nerve deficit (no facial droop, extraocular movements intact, no slurred speech).  ?   Sensory: No sensory deficit.  ?   Motor: No abnormal muscle tone or seizure activity.  ?   Coordination: Coordination normal.  ?   Comments: No pronator drift bilateral upper extrem, able to hold both legs off bed for 5 seconds, sensation intact in all extremities, no visual field cuts, no left or right sided neglect, pass pointing on finger-nose exam bilaterally, no nystagmus noted ? ?No facial droop, extraocular movements intact, tongue midline  ?Psychiatric:     ?   Mood and Affect: Mood normal.  ? ? ?ED Results / Procedures / Treatments   ?Labs ?(all labs ordered are listed, but only abnormal results are displayed) ?Labs Reviewed  ?COMPREHENSIVE METABOLIC PANEL - Abnormal; Notable for the following components:  ?    Result Value  ? Glucose, Bld 105 (*)   ? BUN 41 (*)   ? Calcium 8.0 (*)   ? Total Protein 5.0 (*)   ? Albumin 3.0 (*)   ? All other components within normal limits  ?CBC WITH DIFFERENTIAL/PLATELET - Abnormal; Notable for the following components:  ? WBC 13.4 (*)   ? RBC 2.90 (*)   ? Hemoglobin 9.1 (*)   ? HCT 27.6 (*)   ? Neutro Abs 11.1 (*)   ? All other components within normal limits  ?IRON AND TIBC - Abnormal; Notable for the following components:  ? TIBC 232 (*)   ? All other components within normal limits  ?RETICULOCYTES - Abnormal; Notable for the following components:  ? RBC. 2.92 (*)   ? All other components within normal limits  ?TROPONIN I (HIGH SENSITIVITY) - Abnormal; Notable for the following components:  ? Troponin I (High Sensitivity) 30 (*)   ? All other components within normal limits  ?RESP PANEL BY  RT-PCR (FLU A&B, COVID) [id number]  ?LACTIC ACID, PLASMA  ?VITAMIN B12  ?FOLATE  ?FERRITIN  ?URINALYSIS, ROUTINE W REFLEX MICROSCOPIC  ?LACTIC ACID, PLASMA  ?CBG MONITORING, ED  ?POC OCCULT BLOOD, ED  ?TYPE AND SCREEN  ?TROPONIN I (HIGH SENSITIVITY)  ? ? ?  EKG ?EKG Interpretation ? ?Date/Time:  Thursday July 31 2021 10:08:30 EDT ?Ventricular Rate:  69 ?PR Interval:  182 ?QRS Duration: 93 ?QT Interval:  399 ?QTC Calculation: 428 ?R Axis:     ?Text Interpretation: Sinus rhythm with sinus arrhythmia Confirmed by Dorie Rank [phone]) on 07/31/2021 10:22:52 AM ? ?Radiology ?CT Head Wo Contrast ? ?Result Date: 07/31/2021 ?CLINICAL DATA:  Altered mental status EXAM: CT HEAD WITHOUT CONTRAST TECHNIQUE: Contiguous axial images were obtained from the base of the skull through the vertex without intravenous contrast. RADIATION DOSE REDUCTION: This exam was performed according to the departmental dose-optimization program which includes automated exposure control, adjustment of the mA and/or kV according to patient size and/or use of iterative reconstruction technique. COMPARISON:  CT head 04/25/2021 FINDINGS: Brain: No acute intracranial hemorrhage, mass effect, or herniation. No extra-axial fluid collections. No evidence of acute territorial infarct. No hydrocephalus. Moderate cortical volume loss. Patchy hypodensities in the periventricular and subcortical white matter, likely secondary to chronic microvascular ischemic changes. Vascular: No hyperdense vessel or unexpected calcification. Skull: Normal. Negative for fracture or focal lesion. Sinuses/Orbits: No acute finding. Other: None. IMPRESSION: Chronic changes with no acute intracranial process identified. Electronically Signed   By: Ofilia Neas M.D.   On: 07/31/2021 11:30  ? ?DG Chest Portable 1 View ? ?Result Date: 07/31/2021 ?CLINICAL DATA:  Syncopal episode. EXAM: PORTABLE CHEST 1 VIEW COMPARISON:  Radiographs 04/25/2021 and 01/25/2021.  CT 08/27/2008. FINDINGS: 1048  hours. Stable chronic elevation of the left hemidiaphragm with associated chronic left basilar atelectasis. The right lung is clear. There is no pleural effusion or pneumothorax. The heart size and mediastinal contour

## 2021-07-31 NOTE — H&P (Signed)
?History and Physical  ? ? ?Patient: Kevin Page YJE:[id number] DOB: Sep 23, 1930 ?DOA: 07/31/2021 ?DOS: the patient was seen and examined on 07/31/2021 ?PCP: Ginger Organ., MD  ?Patient coming from: Home - lives with his wife  ? ? ?Chief Complaint: syncope/LOC  ? ?HPI: Kevin Page is a 87 y.o. male with medical history significant of MCI,  BPH, HTN, hx of TIAs, hx of rectal bleed in 2022 with hospitalization and conservative management, SVT who presented to ED for syncope.  History from wife.  ? ?She went to get him up at 8:30Am and told him to go to the bathroom and he was non responsive. He wouldn't smile, raise his arms and just stared at her so she called EMS. On the way out of the house, he had syncopal episode where he got stiff and rigid and when he got to ED he was responsive. No seizure like activity reported. Denies any prodromal symptoms specifically dizziness, chest pain or palpitations. Blood pressure when Ems arrives was 98/58 and given 500cc bolus. Wife states he presented similiarly with his past TIAs.  ? ?He is resting comfortably in bed. Denies any pain. Denies any recent illness/fever or chills, no headache or dizziness, no chest pain or palpitations, no shortness of breath or cough, no stomach pain, no N/V/D, no dysuria or urinary symptoms and no leg swelling.  ?Per notes, possible UTI symptoms over last 2 days with increased urinary frequency.  ? ?Does not f/u with GI. Had colonoscopy "years ago."  ? ?ER Course:  vitals: afebrile, bp: 113/72, HR: 69, RR: 16, oxygen: 95%RA ?Pertinent labs: BUN: 41, wbc: 13.4, hgb: 9.1, troponin 30> ?B12: 214, fecal occult: negative ?CXR: no acute findings ?CT head: no acute finding ?In ED started on IVF  ? ? ?Review of Systems: As mentioned in the history of present illness. All other systems reviewed and are negative. ?Past Medical History:  ?Diagnosis Date  ? Allergy   ? dust and mites  ? BPH (benign prostatic hyperplasia)   ? Essential hypertension   ?  Hyperlipidemia   ? Macular degeneration   ? Osteoarthritis of both knees   ? Sinus bradycardia   ? SVT (supraventricular tachycardia) ([place])   ? a. Event monitor 2014: few episodes of SVT, longest 12 beats with rate 128.  ? Syncope   ? ?Past Surgical History:  ?Procedure Laterality Date  ? CATARACT EXTRACTION W/ INTRAOCULAR LENS  IMPLANT, BILATERAL Bilateral 05/18/2009  ? dupytron contractions  1998 , 2006  ? both hands  ? gi bleeding    ? LUMBAR FUSION  05/18/1992  ? ?Social History:  reports that he has quit smoking. He has never used smokeless tobacco. He reports current alcohol use of about 12.0 standard drinks per week. He reports that he does not use drugs. ? ?No Known Allergies ? ?Family History  ?Problem Relation Age of Onset  ? Cancer Brother   ?     tonsils  ? Heart attack Neg Hx   ? ? ?Prior to Admission medications   ?Medication Sig Start Date End Date Taking? Authorizing Provider  ?aspirin EC 81 MG tablet Take 1 tablet (81 mg total) by mouth daily. Hold for now, resume when ok with GI ?Patient taking differently: Take 81 mg by mouth daily. 01/25/21  Yes Florencia Reasons, MD  ?cholecalciferol (VITAMIN D3) 25 MCG (1000 UNIT) tablet Take 1,000 Units by mouth daily.   Yes [provider]  ?diclofenac Sodium (VOLTAREN) 1 % GEL 2 g  4 (four) times daily as needed (pain). 05/20/21  Yes [provider]  ?donepezil (ARICEPT) 10 MG tablet Take 10 mg by mouth at bedtime. 03/28/17  Yes [provider]  ?dutasteride (AVODART) 0.5 MG capsule Take 0.5 mg by mouth at bedtime.   Yes [provider]  ?guaiFENesin (MUCINEX) 600 MG 12 hr tablet Take 1 tablet (600 mg total) by mouth 2 (two) times daily. ?Patient taking differently: Take 600 mg by mouth daily. 01/25/21  Yes Florencia Reasons, MD  ?MYRBETRIQ 25 MG TB24 tablet Take 25 mg by mouth daily. 06/27/21  Yes [provider]  ?OVER THE COUNTER MEDICATION Take 2 tablets by mouth daily. Eye supplement from Dr.Digby office ( Macular protection  complete)   Yes [provider]  ? ? ?Physical Exam: ?Vitals:  ? 07/31/21 1445 07/31/21 1515 07/31/21 1530 07/31/21 1741  ?BP: 118/69 126/67 128/79 (!) 141/76  ?Pulse: 76 66 83 70  ?Resp:   18 16  ?Temp:    98.8 ?F (37.1 ?C)  ?TempSrc:    Oral  ?SpO2: 100% 100% 100% 93%  ?Weight:      ?Height:      ? ?General:  Appears calm and comfortable and is in NAD ?Eyes:  PERRL, EOMI, normal lids, iris ?ENT:  grossly normal hearing, lips & tongue, mmm; appropriate dentition ?Neck:  no LAD, masses or thyromegaly; no carotid bruits ?Cardiovascular:  RRR, no m/r/g. No LE edema.  ?Respiratory:   CTA bilaterally with no wheezes/rales/rhonchi.  Normal respiratory effort. ?Abdomen:  soft, NT, ND, NABS ?Back:   normal alignment, no CVAT ?Skin:  no rash or induration seen on limited exam ?Musculoskeletal:  grossly normal tone BUE/BLE, good ROM, no bony abnormality ?Lower extremity:  No LE edema.  Limited foot exam with no ulcerations.  2+ distal pulses. ?Psychiatric:  grossly normal mood and affect, speech fluent and appropriate, Alert and oriented to self, place, not date.  ?Neurologic:  CN 2-12 grossly intact, moves all extremities in coordinated fashion, sensation intact ? ? ?Radiological Exams on Admission: ?Independently reviewed - see discussion in A/P where applicable ? ?CT Head Wo Contrast ? ?Result Date: 07/31/2021 ?CLINICAL DATA:  Altered mental status EXAM: CT HEAD WITHOUT CONTRAST TECHNIQUE: Contiguous axial images were obtained from the base of the skull through the vertex without intravenous contrast. RADIATION DOSE REDUCTION: This exam was performed according to the departmental dose-optimization program which includes automated exposure control, adjustment of the mA and/or kV according to patient size and/or use of iterative reconstruction technique. COMPARISON:  CT head 04/25/2021 FINDINGS: Brain: No acute intracranial hemorrhage, mass effect, or herniation. No extra-axial fluid collections. No evidence of  acute territorial infarct. No hydrocephalus. Moderate cortical volume loss. Patchy hypodensities in the periventricular and subcortical white matter, likely secondary to chronic microvascular ischemic changes. Vascular: No hyperdense vessel or unexpected calcification. Skull: Normal. Negative for fracture or focal lesion. Sinuses/Orbits: No acute finding. Other: None. IMPRESSION: Chronic changes with no acute intracranial process identified. Electronically Signed   By: Ofilia Neas M.D.   On: 07/31/2021 11:30  ? ?DG Chest Portable 1 View ? ?Result Date: 07/31/2021 ?CLINICAL DATA:  Syncopal episode. EXAM: PORTABLE CHEST 1 VIEW COMPARISON:  Radiographs 04/25/2021 and 01/25/2021.  CT 08/27/2008. FINDINGS: 1048 hours. Stable chronic elevation of the left hemidiaphragm with associated chronic left basilar atelectasis. The right lung is clear. There is no pleural effusion or pneumothorax. The heart size and mediastinal contours are stable with aortic atherosclerosis. No acute osseous findings are evident. There  are signs of chronic rotator cuff tears and advanced glenohumeral arthropathy bilaterally. Telemetry leads overlie the chest. IMPRESSION: Stable chest without evidence of acute cardiopulmonary process. Electronically Signed   By: Richardean Sale M.D.   On: 07/31/2021 10:55  ? ?ECHOCARDIOGRAM COMPLETE ? ?Result Date: 07/31/2021 ?   ECHOCARDIOGRAM REPORT   Patient Name:   BRESLIN HEMANN Date of Exam: 07/31/2021 Medical Rec #:  [id number]    Height:       70.0 in Accession #:    [id number]   Weight:       145.0 lb Date of Birth:  09-24-1930    BSA:          1.821 m? Patient Age:    45 years     BP:           113/72 mmHg Patient Gender: M            HR:           81 bpm. Exam Location:  Inpatient Procedure: 2D Echo, Cardiac Doppler and Color Doppler Indications:    Syncope  History:        Patient has no prior history of Echocardiogram examinations.                 TIA; Arrythmias:Bradycardia.  Sonographer:    Jyl Heinz Referring Phys: [id number] [place]  1. Left ventricular ejection fraction, by estimation, is 65 to 70%. The left ventricle has normal function. The left ventricle has no regional wall motion abnor

## 2021-07-31 NOTE — Assessment & Plan Note (Addendum)
Vitamin B12 214, low normal. ?--Started on Vitamin B12 1000 mcg p.o. daily ?

## 2021-07-31 NOTE — Assessment & Plan Note (Addendum)
Hemoglobin on arrival 9.1 (baseline 11-12).  History of GI bleed in the past treated conservatively, presumed diverticular.  Denies NSAID abuse, only on aspirin outpatient and no other anticoagulant/antiplatelet.  Initial FOBT was negative, repeat positive.  Patient with elevated BUN on arrival.  Anemia panel with iron 68, TIBC 232 (low), ferritin 33, folate 14.4, B12 214 (low normal).  Seen by [facility] gastroenterology, recommend conservative measures at this time with plan outpatient follow-up in 3-4 weeks.  Suspected etiology possible gastric ulcer given melena. ?--Hgb 9.1>9.0>8.3>8.0>8.3>7.2>9.2>9.1>9.2; stable ?--s/p 1u pRBC 3/17 ?--holding aspirin ?--Protonix '40mg'$  PO BID x 21 days; followed by '40mg'$  PO daily ?--Transfuse for hemoglobin< 7.0 or active bleeding ?

## 2021-07-31 NOTE — Assessment & Plan Note (Addendum)
Continue avodart.  

## 2021-07-31 NOTE — Assessment & Plan Note (Addendum)
High sensitive troponin 30> 37> 47.  Denies chest pain.  No concerning dynamic changes on EKG. ?--Continue monitor on telemetry ?

## 2021-07-31 NOTE — Progress Notes (Signed)
Pt combative, made multiple attempts at comfort despite pt yelling and constantly trying to turn on his side, not even a minute into the first scan pt began kicking his legs and screaming trying to pull himself down out of the scanner. Exam terminated for pt safety and sent back to floor, unable to scan pt in this state.   ?

## 2021-07-31 NOTE — Assessment & Plan Note (Addendum)
Spouse reports mild cognitive impairment.  On Aricept outpatient, currently holding as small percentage associated with syncope. ?--Delirium precautions ?--Get up during the day ?--Encourage a familiar face to remain present throughout the day ?--Keep blinds open and lights on during daylight hours ?--Minimize the use of opioids/benzodiazepines ?

## 2021-07-31 NOTE — ED Triage Notes (Signed)
BIB [facility] s/p syncope. Syncope prior to EMS, and with EMS. Normally ambulates, but was unable to walk to b/r this am. Mentions UTI sx over last 2d. H/o MCI, and TIA. Stroke scale negative. Denies pain. CBG 140. BP was 98/58, and improved after NS 500cc IVF bolus. Lives with wife. Alert, NAD, calm, interactive, some confusion. NSL 18g L AC on arrival.   ?

## 2021-07-31 NOTE — Assessment & Plan Note (Addendum)
Patient presenting to the ED following witnessed syncopal episode x2 with unresponsiveness.  No reported seizure-like activity.  Was notably orthostatic with low BP on EMS arrival.  Urinalysis, chest x-ray unrevealing.  CT head without contrast with no acute intracranial findings.  TTE with LVEF 65-70%, no regional wall motion abnormalities, mild AR.  MR brain without contrast with no evidence of acute intracranial abnormality, notable for cerebral atrophy.  Etiology likely secondary to orthostatic hypotension from dehydration in the setting of cognitive impairment versus autonomic dysfunction.  Also complicated by anemia. ?--Continue to monitor on telemetry ?--TED hose, supportive care ?--PT/OT evaluation ?--Fall precautions ?

## 2021-07-31 NOTE — Progress Notes (Signed)
Pt admitted to 3W26 at this time.  Wife at bedside.  Pt is alert and oriented to person, place, but disoriented to time and situation.  Telemetry placed on patient, CCMD called and second verification completed.  Bed alarm set and call bell within reach.  Pt verbalizes understanding to call before attempting to get out of bed. ?

## 2021-08-01 ENCOUNTER — Inpatient Hospital Stay (HOSPITAL_COMMUNITY): Payer: Medicare HMO

## 2021-08-01 ENCOUNTER — Encounter: Payer: Self-pay | Admitting: Internal Medicine

## 2021-08-01 DIAGNOSIS — N400 Enlarged prostate without lower urinary tract symptoms: Secondary | ICD-10-CM | POA: Diagnosis not present

## 2021-08-01 DIAGNOSIS — R69 Illness, unspecified: Secondary | ICD-10-CM | POA: Diagnosis not present

## 2021-08-01 DIAGNOSIS — K922 Gastrointestinal hemorrhage, unspecified: Secondary | ICD-10-CM | POA: Insufficient documentation

## 2021-08-01 DIAGNOSIS — Z800 Family history of malignant neoplasm of digestive organs: Secondary | ICD-10-CM | POA: Diagnosis not present

## 2021-08-01 DIAGNOSIS — R195 Other fecal abnormalities: Secondary | ICD-10-CM

## 2021-08-01 DIAGNOSIS — D62 Acute posthemorrhagic anemia: Secondary | ICD-10-CM | POA: Diagnosis not present

## 2021-08-01 DIAGNOSIS — R55 Syncope and collapse: Secondary | ICD-10-CM | POA: Diagnosis not present

## 2021-08-01 DIAGNOSIS — Z87891 Personal history of nicotine dependence: Secondary | ICD-10-CM | POA: Diagnosis not present

## 2021-08-01 DIAGNOSIS — R4189 Other symptoms and signs involving cognitive functions and awareness: Secondary | ICD-10-CM | POA: Diagnosis not present

## 2021-08-01 DIAGNOSIS — Z7982 Long term (current) use of aspirin: Secondary | ICD-10-CM | POA: Diagnosis not present

## 2021-08-01 DIAGNOSIS — R778 Other specified abnormalities of plasma proteins: Secondary | ICD-10-CM

## 2021-08-01 DIAGNOSIS — Z79899 Other long term (current) drug therapy: Secondary | ICD-10-CM | POA: Diagnosis not present

## 2021-08-01 DIAGNOSIS — E538 Deficiency of other specified B group vitamins: Secondary | ICD-10-CM

## 2021-08-01 DIAGNOSIS — D513 Other dietary vitamin B12 deficiency anemia: Secondary | ICD-10-CM | POA: Diagnosis not present

## 2021-08-01 DIAGNOSIS — L259 Unspecified contact dermatitis, unspecified cause: Secondary | ICD-10-CM | POA: Diagnosis present

## 2021-08-01 DIAGNOSIS — D518 Other vitamin B12 deficiency anemias: Secondary | ICD-10-CM | POA: Diagnosis not present

## 2021-08-01 DIAGNOSIS — Z66 Do not resuscitate: Secondary | ICD-10-CM | POA: Diagnosis not present

## 2021-08-01 DIAGNOSIS — Z91199 Patient's noncompliance with other medical treatment and regimen due to unspecified reason: Secondary | ICD-10-CM | POA: Diagnosis not present

## 2021-08-01 DIAGNOSIS — R5381 Other malaise: Secondary | ICD-10-CM | POA: Diagnosis not present

## 2021-08-01 DIAGNOSIS — I951 Orthostatic hypotension: Secondary | ICD-10-CM | POA: Diagnosis not present

## 2021-08-01 DIAGNOSIS — F0390 Unspecified dementia without behavioral disturbance: Secondary | ICD-10-CM | POA: Diagnosis present

## 2021-08-01 DIAGNOSIS — R1310 Dysphagia, unspecified: Secondary | ICD-10-CM | POA: Diagnosis not present

## 2021-08-01 DIAGNOSIS — R4182 Altered mental status, unspecified: Secondary | ICD-10-CM | POA: Diagnosis not present

## 2021-08-01 DIAGNOSIS — Z8673 Personal history of transient ischemic attack (TIA), and cerebral infarction without residual deficits: Secondary | ICD-10-CM | POA: Diagnosis not present

## 2021-08-01 DIAGNOSIS — I248 Other forms of acute ischemic heart disease: Secondary | ICD-10-CM | POA: Diagnosis not present

## 2021-08-01 DIAGNOSIS — E785 Hyperlipidemia, unspecified: Secondary | ICD-10-CM | POA: Diagnosis present

## 2021-08-01 DIAGNOSIS — Z20822 Contact with and (suspected) exposure to covid-19: Secondary | ICD-10-CM | POA: Diagnosis not present

## 2021-08-01 DIAGNOSIS — I10 Essential (primary) hypertension: Secondary | ICD-10-CM | POA: Diagnosis not present

## 2021-08-01 DIAGNOSIS — Z981 Arthrodesis status: Secondary | ICD-10-CM | POA: Diagnosis not present

## 2021-08-01 DIAGNOSIS — D649 Anemia, unspecified: Secondary | ICD-10-CM | POA: Diagnosis not present

## 2021-08-01 LAB — CBC
HCT: 23.9 % — ABNORMAL LOW (ref 39.0–52.0)
HCT: 24.8 % — ABNORMAL LOW (ref 39.0–52.0)
Hemoglobin: 8 g/dL — ABNORMAL LOW (ref 13.0–17.0)
Hemoglobin: 8.3 g/dL — ABNORMAL LOW (ref 13.0–17.0)
MCH: 31.3 pg (ref 26.0–34.0)
MCH: 31.3 pg (ref 26.0–34.0)
MCHC: 33.5 g/dL (ref 30.0–36.0)
MCHC: 33.5 g/dL (ref 30.0–36.0)
MCV: 93.4 fL (ref 80.0–100.0)
MCV: 93.6 fL (ref 80.0–100.0)
Platelets: 216 10*3/uL (ref 150–400)
Platelets: 211 10*3/uL (ref 150–400)
RBC: 2.56 MIL/uL — ABNORMAL LOW (ref 4.22–5.81)
RBC: 2.65 MIL/uL — ABNORMAL LOW (ref 4.22–5.81)
RDW: 13.8 % (ref 11.5–15.5)
RDW: 14 % (ref 11.5–15.5)
WBC: 8.7 10*3/uL (ref 4.0–10.5)
WBC: 9.5 10*3/uL (ref 4.0–10.5)
nRBC: 0 % (ref 0.0–0.2)
nRBC: 0 % (ref 0.0–0.2)

## 2021-08-01 LAB — TYPE AND SCREEN
ABO/RH(D): A POS
Antibody Screen: NEGATIVE
Unit division: 0

## 2021-08-01 LAB — BPAM RBC
Blood Product Expiration Date: 202303252359
Unit Type and Rh: 6200

## 2021-08-01 LAB — HEMOGLOBIN AND HEMATOCRIT, BLOOD
HCT: 22.2 % — ABNORMAL LOW (ref 39.0–52.0)
Hemoglobin: 7.2 g/dL — ABNORMAL LOW (ref 13.0–17.0)

## 2021-08-01 LAB — PREPARE RBC (CROSSMATCH)

## 2021-08-01 LAB — BASIC METABOLIC PANEL
Anion gap: 6 (ref 5–15)
BUN: 28 mg/dL — ABNORMAL HIGH (ref 8–23)
CO2: 26 mmol/L (ref 22–32)
Calcium: 8.2 mg/dL — ABNORMAL LOW (ref 8.9–10.3)
Chloride: 107 mmol/L (ref 98–111)
Creatinine, Ser: 0.77 mg/dL (ref 0.61–1.24)
GFR, Estimated: >60 mL/min (ref >60)
Glucose, Bld: 93 mg/dL (ref 70–99)
Potassium: 3.4 mmol/L — ABNORMAL LOW (ref 3.5–5.1)
Sodium: 139 mmol/L (ref 135–145)

## 2021-08-01 LAB — OCCULT BLOOD X 1 CARD TO LAB, STOOL: Fecal Occult Bld: POSITIVE — AB

## 2021-08-01 LAB — GLUCOSE, CAPILLARY: Glucose-Capillary: 83 mg/dL (ref 70–99)

## 2021-08-01 MED ORDER — SODIUM CHLORIDE 0.9% IV SOLUTION: Freq: Once | INTRAVENOUS | Status: AC

## 2021-08-01 NOTE — Hospital Course (Signed)
Kevin Page is a 88 year old male with past medical history significant for mild cognitive impairment, BPH, essential hypertension, history of TIA, history of rectal bleed 2022 presumed to be diverticular, history of SVT who presented to [facility] ED on 3/16 via EMS with reported syncopal episode.  Spouse reports she went to get him up around 8:30 AM and told him to go to the bathroom and he was apparently nonresponsive.  He would not smile, raise his arms and just stared at her and subsequently spouse called EMS.  On the way out of the house, patient had a syncopal episode where he got stiff and rigid, no seizure-like activity reported.  Patient denied any prodromal symptoms such as dizziness, no chest pain, no palpitations.  On EMS arrival, BP was noted 98/58 and was given a 500 cc bolus.  Wife states he presented similarly in the past with TIAs.  On arrival to the ED, patient's symptoms were completely resolved.  He further denied pain, no recent illness/fever, no chills, no headache, no dizziness, no chest pain, no palpitations, no shortness of breath, no cough, no abdominal pain, no nausea/vomiting/diarrhea, no dysuria/urinary symptoms and no leg swelling. ? ?In the ED, temperature 98.3 ?F, HR 69, RR 16, BP 113/72, SPO2 95% on room air.  WBC 13.4, hemoglobin 9.1, platelets 237.  Sodium 137, potassium 3.8, chloride 103, CO2 26, glucose 105, BUN 41, creatinine 0.81.  AST 22, ALT 12, total bilirubin 0.6.  Lactic acid 1.7.  COVID-19 PCR negative.  Influenza A/B PCR negative.  Urinalysis unrevealing.  CT head without contrast with chronic changes with no acute intracranial process.  Chest x-ray with no evidence of acute cardiopulmonary disease process.  Initial FOBT negative, repeat positive.  Positive orthostatic vital signs.  EDP started patient on IV fluids.  TRH consulted for further evaluation and management of syncope, GI bleed. ?

## 2021-08-01 NOTE — Progress Notes (Signed)
Secure chat received from nurse stating that patient is currently eating at this time. Instructed nurse to re-consult IV team when patient is ready for IV team to assess and place PIV. Fran Lowes, RN VAST ?

## 2021-08-01 NOTE — TOC Initial Note (Signed)
Transition of Care (TOC) - Initial/Assessment Note  ? ? ?Patient Details  ?Name: Kevin Page ?MRN: [id number] ?Date of Birth: Feb 09, 1931 ? ?Transition of Care (TOC) CM/SW Contact:    ?Pollie Friar, RN ?Phone Number: ?08/01/2021, 2:52 PM ? ?Clinical Narrative:                 ?Patient is from home with his spouse. Patient sleeping during CM visit. Wife states she is with the patient all the time. She denies any issues with home medications or transportation.  ?Recommendations for home health services. She states they just recently finished with a home health agency. CM found it was [facility]) home health. Wife would like to use them again. Levada Dy with [facility] accepted the referral. Information on the AVS.  ?Wife is able to provide transport home at time of discharge.  ? ?Expected Discharge Plan: [place] ?Barriers to Discharge: Continued Medical Work up ? ? ?Patient Goals and CMS Choice ?  ?CMS Medicare.gov Compare Post Acute Care list provided to:: Patient Represenative (must comment) ?Choice offered to / list presented to : Spouse ? ?Expected Discharge Plan and Services ?Expected Discharge Plan: [place] ?  ?Discharge Planning Services: CM Consult ?Post Acute Care Choice: Home Health ?Living arrangements for the past 2 months: [place] ?                ?  ?  ?  ?  ?  ?HH Arranged: Therapist, sports, PT ?[place] Agency: [place] ?Date HH Agency Contacted: 08/01/21 ?  ?Representative spoke with at [place]: Levada Dy ? ?Prior Living Arrangements/Services ?Living arrangements for the past 2 months: [place] ?Lives with:: Spouse ?Patient language and need for interpreter reviewed:: Yes ?Do you feel safe going back to the place where you live?: Yes      ?Need for Family Participation in Patient Care: Yes (Comment) ?Care giver support system in place?: Yes (comment) ?Current home services: DME (wheelchair/ shower seat / rollator) ?Criminal Activity/Legal  Involvement Pertinent to Current Situation/Hospitalization: No - Comment as needed ? ?Activities of Daily Living ?  ?  ? ?Permission Sought/Granted ?  ?  ?   ?   ?   ?   ? ?Emotional Assessment ?Appearance:: Appears stated age ?  ?  ?Orientation: : Oriented to Self, Oriented to Place ?  ?Psych Involvement: No (comment) ? ?Admission diagnosis:  Syncope and collapse [R55] ?Syncope [R55] ?Anemia, unspecified type [D64.9] ?Dementia, unspecified dementia severity, unspecified dementia type, unspecified whether behavioral, psychotic, or mood disturbance or anxiety ([place]) [F03.90] ?GI bleed [K92.2] ?Patient Active Problem List  ? Diagnosis Date Noted  ? GI bleed 08/01/2021  ? Anemia, GI Bleed 07/31/2021  ? B12 deficiency 07/31/2021  ? Elevated troponin 07/31/2021  ? Dysphagia 03/30/2021  ? Disequilibrium 03/30/2021  ? Overflow incontinence of urine 03/30/2021  ? Diverticular hemorrhage   ? Rectal bleeding 01/23/2021  ? Painless rectal bleeding 01/22/2021  ? Cognitive impairment 01/22/2021  ? Abnormal gait 10/12/2019  ? Palpitations 06/13/2019  ? SVT (supraventricular tachycardia) ([place])   ? Sinus bradycardia   ? Osteoarthritis of both knees   ? Macular degeneration   ? Allergy   ? Advanced dry age-related macular degeneration of both eyes with subfoveal involvement 03/03/2016  ? Hypertensive retinopathy of both eyes 03/03/2016  ? Pseudophakia of both eyes 03/03/2016  ? PVD (posterior vitreous detachment), both eyes 03/03/2016  ? Orthostasis 01/19/2016  ? Dilated aortic root ([place])  01/19/2016  ? PSVT (paroxysmal supraventricular tachycardia) ([place]) 01/19/2016  ? Mild aortic insufficiency 01/19/2016  ? Sinus bradycardia seen on cardiac monitor 01/18/2016  ? Postnasal drip 11/05/2014  ? Age-related osteoporosis without current pathological fracture 11/13/2013  ? Incontinence of feces 11/01/2013  ? Tobacco user 11/01/2013  ? Bradycardia 01/30/2013  ? Syncope 01/12/2013  ? BPH (benign prostatic hyperplasia) 01/12/2013  ? Personal  history of transient ischemic attack (TIA), and cerebral infarction without residual deficits 01/13/2012  ? Osteoarthritis 05/21/2011  ? ?PCP:  Ginger Organ., MD ?Pharmacy:   ?[id number] [place] [place]?[id number]?Phone: [phone]: [phone]? ? ? ? ?Social Determinants of Health (SDOH) Interventions ?  ? ?Readmission Risk Interventions ?No flowsheet data found. ? ? ?

## 2021-08-01 NOTE — Consult Note (Addendum)
? ? ? ? ? Consultation ? ?Referring Provider:   Dr. Eric [place] ?Primary Care Physician:  Ginger Organ., MD ?Primary Gastroenterologist:  Dr. Carlean Purl       ?Reason for Consultation:   Normocytic Anemia, syncope   ? ? Attending physician's note  ?I have taken a history, reviewed the chart and examined the patient. I performed a substantive portion of this encounter, including complete performance of at least one of the key components, in conjunction with the APP. I agree with the APP's note, impression and recommendations.  ?  ? ?88 year old very pleasant gentleman with history of TIA, dementia, hypertension admitted with acute change in mental status after syncopal episode ?CT head was unremarkable ?MRI brain is pending ? ?Presentation is concerning for TIA and he is back on low-dose aspirin ? ?Subacute intermittent occult GI blood loss likely etiology of worsening anemia ?Given recent changes in mental status, ?  TIA we will hold off endoscopic evaluation unless he starts having evidence of active GI hemorrhage that needs therapeutic intervention ? ?We will manage conservatively ?Okay to continue low-dose aspirin ?Avoid any other NSAIDs ?Pantoprazole 40 mg daily for possible erosive gastroduodenitis ? ?B12 deficiency: Start B12 subcu injections 1000 mcg weekly for 4 weeks to replete and start sublingual 1000 mcg daily for maintenance ? ?He had a self-limited lower GI bleed in September which was thought to be secondary to diverticular hemorrhage ? ?Will arrange for GI office follow-up visit with Dr. Carlean Purl or app in 3 to 4 weeks. ?Repeat CBC in 1 week through PMD Dr. Raul Del office ? ?GI is available if needed, please call with any questions ? ?The patient was provided an opportunity to ask questions and all were answered. The patient agreed with the plan and demonstrated an understanding of the instructions. ? ?K. Denzil Magnuson , MD ?[phone]   ? ? Impression   ? ?Normocytic Anemia without hemodynamic  compromise  ?without overt bleeding, FOBT negative in ER but repeat positive ?CBC on 08/01/2021   ?HGB 8.3 down from baseline of 12/13 in Sept.  ? MCV 93.6 Platelets 211 ?Anemia studies on 07/31/2021  ?Iron 68 Ferritin 33 B12 214- iron normal, low B12 ?Initial BUN elevated 41 up from baseline of 14, most recently 28. ? ?AKI ?BUN 28 Cr 0.77  ?GFR >60  ?Potassium 3.4  Magnesium 2.1  ? ?Syncope ?Slight increase troponins, unremarkable CT head, chest x-ray negative ?Pending MRI brain ?Question hypotension, corrected with fluids. ?14 day monitor negative, following with cardio ? ?History of TIAs, cognitive impairment ?Started back on low dose ASA in Dec ? ?SVT ?14 day monitor negative, following with cardio ? ?Dementia ?Currently aware of self and place, has poor short-term memory and unaware of time.  ?We will need to discuss with wife [name] ? ? ? Plan  ? ?-Protonix 40 mg IV BID. ?-Clear liquid diet ?--Continue to monitor H&H with transfusion as needed to maintain hemoglobin greater than 7. ?-We will hold off for endoscopic evaluation at this time due to syncopal episode, dementia and pending MRI, no overt bleeding.  Discussed this with his wife Judson Roch, she agrees with this plan, prefers not to have patient have procedure or sedation if not needed.  ? If any evidence of overt bleeding, can proceed with upper endoscopy.  ? Patient is unable to sign consent form, will contact [name] if this is needed. ?-No iron deficiency anemia at this time, B12 low, can continue supportive care with B12 1000 mcg daily ? ? ?  Thank you for your kind consultation, we will continue to follow. ?       ? HPI:   ?Kevin Page is a 88 y.o. male with past medical history significant for SVT, HTN, hyperlipidemia, history of TIAs, cognitive impairment, BPH, history of rectal bleeding 2022 with hospitalization conservative management others listed below. ?Patient had recent admission 01/23/2021 for GI bleed with hemoglobin from 13.4-11.8, had rectal  bleeding at that time suspected to be diverticular hemorrhage.  Did not follow-up in the office. ?At that time patient also had previous NSAID use. ?July 2013 screening colonoscopy  ?Complete exam , excellent bowel prep.  ?Severe left sided diverticulosis, otherwise normal.  ? ?Patient had syncopal episode, had hypotension on arrival improved after fluid bolus. ?Found to have hemoglobin 9.1, elevation BUN 41, negative fecal occult in the ER but repeat positive. ?Normal heart rate, normal oxygen saturations on room air. ?Slight elevated troponin, unremarkable EKG, unremarkable CT head, chest x-ray negative, low B12 at 214. ? ?Patient was lying in bed with the rail up with his legs hanging over, asking for help when I entered the room. ?Per nurse patient has been trying to get out of bed all night and if have not put him back in, some confusion. ?Patient went for the MRI however he was combative noncompliant and unable to proceed. ?Patient does have some vision difficulty when seen. ?States he is hungry, has liquid tray in front of him. ?Per nurse states that he had a bowel movement last night and this was Hemoccult positive, this was brown large stool some dryness to it, no signs of overt bleeding. ?Patient is very pleasant knows that he is at [facility]. ? ?Talked with [name] wife.  ?No signs of bleeding at home, has been no black or red stools.  ?He was started back on bASA since Dec.  ?No NSAIDS, no ETOH.  ?Use to have gin nightly has not since rehab in Oct, no NSAIDS since sept.  ?He does not have much of appetite, no nausea, vomiting.  ?He has a lot of congestion, drools a lot, has a lot of mucus, had normal barium swallow 04/02/21.  ?Not on iron at home.  ? ?Past Medical History:  ?Diagnosis Date  ? Allergy   ? dust and mites  ? BPH (benign prostatic hyperplasia)   ? Essential hypertension   ? Hyperlipidemia   ? Macular degeneration   ? Osteoarthritis of both knees   ? Sinus bradycardia   ? SVT (supraventricular  tachycardia) ([place])   ? a. Event monitor 2014: few episodes of SVT, longest 12 beats with rate 128.  ? Syncope   ? ? ?Surgical History:  ?He  has a past surgical history that includes Cataract extraction w/ intraocular lens  implant, bilateral (Bilateral, 05/18/2009); Lumbar fusion (05/18/1992); dupytron contractions (1998 , 2006); and gi bleeding. ?Family History:  ?His family history includes Cancer in his brother. ?Social History:  ? reports that he has quit smoking. He has never used smokeless tobacco. He reports current alcohol use of about 12.0 standard drinks per week. He reports that he does not use drugs. ? ?Prior to Admission medications   ?Medication Sig Start Date End Date Taking? Authorizing Provider  ?aspirin EC 81 MG tablet Take 1 tablet (81 mg total) by mouth daily. Hold for now, resume when ok with GI ?Patient taking differently: Take 81 mg by mouth daily. 01/25/21  Yes Florencia Reasons, MD  ?cholecalciferol (VITAMIN D3) 25 MCG (1000 UNIT) tablet Take  1,000 Units by mouth daily.   Yes [provider]  ?diclofenac Sodium (VOLTAREN) 1 % GEL 2 g 4 (four) times daily as needed (pain). 05/20/21  Yes [provider]  ?donepezil (ARICEPT) 10 MG tablet Take 10 mg by mouth at bedtime. 03/28/17  Yes [provider]  ?dutasteride (AVODART) 0.5 MG capsule Take 0.5 mg by mouth at bedtime.   Yes [provider]  ?guaiFENesin (MUCINEX) 600 MG 12 hr tablet Take 1 tablet (600 mg total) by mouth 2 (two) times daily. ?Patient taking differently: Take 600 mg by mouth daily. 01/25/21  Yes Florencia Reasons, MD  ?MYRBETRIQ 25 MG TB24 tablet Take 25 mg by mouth daily. 06/27/21  Yes [provider]  ?OVER THE COUNTER MEDICATION Take 2 tablets by mouth daily. Eye supplement from Dr.Digby office ( Macular protection complete)   Yes [provider]  ? ? ?Current Facility-Administered Medications  ?Medication Dose Route Frequency Provider Last Rate Last Admin  ? 0.9 %  sodium chloride infusion    Intravenous Continuous Orma Flaming, MD 100 mL/hr at 07/31/21 2300 New Bag at 07/31/21 2300  ? acetaminophen (TYLENOL) tablet 650 mg  650 mg Oral Q6H PRN Orma Flaming, MD      ? Or  ? acetaminophen (

## 2021-08-01 NOTE — Progress Notes (Signed)
SLP Cancellation Note ? ?Patient Details ?Name: Kevin Page ?MRN: [id number] ?DOB: 05-Feb-1931 ? ? ?Cancelled treatment:       Reason Eval/Treat Not Completed: SLP screened, no needs identified, will sign off. Pt with baseline cognitive impairment without acute change per wife who is responsible for pts care. Would like to focus on physical therapy as greatest area for potential benefit in home health. No SLP f/u needed.  ? ? ?[name], Katherene Ponto ?08/01/2021, 11:16 AM ?

## 2021-08-01 NOTE — Progress Notes (Signed)
?PROGRESS NOTE ? ? ? ?Kevin Page  AGT:[id number] DOB: 15-Sep-1930 DOA: 07/31/2021 ?PCP: Ginger Organ., MD  ? ? ?Brief Narrative:  ?Kevin Page is a 88 year old male with past medical history significant for mild cognitive impairment, BPH, essential hypertension, history of TIA, history of rectal bleed 2022 presumed to be diverticular, history of SVT who presented [facility] ED on 3/16 via EMS with reported syncopal episode.  Spouse reports she went to get him up around 8:30 AM and told him to go to the bathroom and he was apparently nonresponsive.  He would not smile, raise his arms and just stared at her and subsequently spouse called EMS.  On the way out of the house, patient had a syncopal episode where he got stiff and rigid, no seizure-like activity reported.  Patient denied any prodromal symptoms such as dizziness, no chest pain, no palpitations.  On EMS arrival, BP was noted 98/58 and was given a 500 cc bolus.  Wife states he presented similarly in the past with TIAs.  On arrival to the ED, patient's symptoms were completely resolved.  He further denied pain, no recent illness/fever, no chills, no headache, no dizziness, no chest pain, no palpitations, no shortness of breath, no cough, no abdominal pain, no nausea/vomiting/diarrhea, no dysuria/urinary symptoms and no leg swelling. ? ?In the ED, temperature 98.3 ?F, HR 69, RR 16, BP 113/72, SPO2 95% on room air.  WBC 13.4, hemoglobin 9.1, platelets 237.  Sodium 137, potassium 3.8, chloride 103, CO2 26, glucose 105, BUN 41, creatinine 0.81.  AST 22, ALT 12, total bilirubin 0.6.  Lactic acid 1.7.  COVID-19 PCR negative.  Influenza A/B PCR negative.  Urinalysis unrevealing.  CT head without contrast with chronic changes with no acute intracranial process.  Chest x-ray with no evidence of acute cardiopulmonary disease process.  Initial FOBT negative, repeat positive.  Positive orthostatic vital signs.  EDP started patient on IV fluids.  TRH consulted for further  evaluation and management of syncope, GI bleed.  ? ? ?Assessment & Plan: ?  ?Assessment and Plan: ?* Syncope ?Patient presenting to the ED following witnessed syncopal episode x2 with unresponsiveness.  No reported seizure-like activity.  Was notably orthostatic with low BP on EMS arrival.  Urinalysis, chest x-ray unrevealing.  CT head without contrast with no acute intracranial findings.  TTE with LVEF 65-70%, no regional wall motion abnormalities, mild AR.  Etiology likely secondary to orthostatic hypotension from dehydration in the setting of cognitive impairment versus autonomic dysfunction.  Also complicated by anemia. ?--MR brain without contrast: Pending ?--Continue to monitor on telemetry ?--Continue IV fluid hydration, daily orthostatic vital signs ?--TED hose, supportive care ?--PT/OT evaluation ?--Fall precautions ? ?Anemia, GI Bleed ?Hemoglobin on arrival 9.1 (baseline 11-12).  History of GI bleed in the past treated conservatively, presumed diverticular.  Denies NSAID abuse, only on aspirin outpatient and no other anticoagulant/antiplatelet.  Initial FOBT was negative, repeat positive.  Patient with elevated BUN on arrival.  Anemia panel with iron 68, TIBC 232 (low), ferritin 33, folate 14.4, B12 214 (low normal) ?--[facility] following; appreciate assistance ?--Hgb 9.1>9.0>8.3>8.0>8.3 ?--holding aspirin ?--Protonix '40mg'$  IV q12h ?--H/H q8h ?--Transfuse for hemoglobin< 7.0 or active bleeding ? ?Elevated troponin ?High sensitive troponin 30> 37> 47.  Denies chest pain.  No concerning dynamic changes on EKG. ?--Continue monitor on telemetry ? ?B12 deficiency ?Vitamin B12 214, low normal. ?--Started on Vitamin B12 1000 mcg p.o. daily ? ?Cognitive impairment ?Spouse reports mild cognitive impairment.  On Aricept outpatient, currently  holding as small percentage associated with syncope. ?--SLP for cognitive evaluation ?--Delirium precautions ?--Get up during the day ?--Encourage a familiar face to remain  present throughout the day ?--Keep blinds open and lights on during daylight hours ?--Minimize the use of opioids/benzodiazepines ? ?BPH (benign prostatic hyperplasia) ?--Continue avodart  ? ? ? ?DVT prophylaxis: SCDs Start: 07/31/21 1350 ?Place TED hose Start: 07/31/21 1350 ? ?  Code Status: DNR ?Family Communication: Updated spouse present at bedside this morning ? ?Disposition Plan:  ?Level of care: Telemetry Medical ?Status is: Inpatient ?Remains inpatient appropriate because: Continue to trend hemoglobin, pending MRI brain and PT/OT/SLP evaluation. ?  ? ?Consultants:  ?[facility] ? ?Procedures:  ?TTE ? ?Antimicrobials:  ?None ? ? ?Subjective: ?Patient seen examined bedside, resting comfortably.  Slightly confused.  Spouse present.  Seen by GI this morning, plan conservative measures with IV Protonix and serial hemoglobin level.  Spouse concerned about the syncope, requesting MRI of the brain for further evaluation.  Patient with no complaints this morning.  Specifically denies headache, no dizziness, no chest pain, no palpitations, no shortness of breath, no fever/chills/night sweats, no nausea/vomiting/diarrhea, no weakness, no fatigue, no paresthesias, no cough/congestion.  No acute events overnight per nursing staff reported. ? ?Objective: ?Vitals:  ? 07/31/21 2105 07/31/21 2329 08/01/21 0409 08/01/21 0802  ?BP: (!) 142/77 129/63 (!) 156/84 (!) 142/80  ?Pulse: 75 74 (!) 56 78  ?Resp: '19 17 18 20  '$ ?Temp: 98 ?F (36.7 ?C) 98.4 ?F (36.9 ?C) 98 ?F (36.7 ?C) 99 ?F (37.2 ?C)  ?TempSrc:  Oral Oral Oral  ?SpO2:  100% 97% 100%  ?Weight:      ?Height:      ? ? ?Intake/Output Summary (Last 24 hours) at 08/01/2021 1101 ?Last data filed at 08/01/2021 0300 ?Gross per 24 hour  ?Intake 470.17 ml  ?Output --  ?Net 470.17 ml  ? ?Filed Weights  ? 07/31/21 1001  ?Weight: 65.8 kg  ? ? ?Examination: ? ?Physical Exam: ?GEN: NAD, alert and oriented to person Microbiologist Biden), [facility]) but not time  (September 2034), elderly in appearance ?HEENT: NCAT, PERRL, EOMI, sclera clear, MMM ?PULM: CTAB w/o wheezes/crackles, normal respiratory effort, on room air ?CV: RRR w/o M/G/R ?GI: abd soft, NTND, NABS, no R/G/M ?MSK: no peripheral edema, muscle strength globally intact 5/5 bilateral upper/lower extremities ?NEURO: CN II-XII intact, no focal deficits, sensation to light touch intact ?PSYCH: normal mood/affect ?Integumentary: dry/intact, no rashes or wounds ? ? ? ?Data Reviewed: I have personally reviewed following labs and imaging studies ? ?CBC: ?Recent Labs  ?Lab 07/31/21 ?1005 07/31/21 ?1349 07/31/21 ?2030 08/01/21 ?0435 08/01/21 ?0732  ?WBC 13.4* 15.6* 10.6* 8.7 9.5  ?NEUTROABS 11.1*  --   --   --   --   ?HGB 9.1* 9.0* 8.3* 8.0* 8.3*  ?HCT 27.6* 28.0* 25.4* 23.9* 24.8*  ?MCV 95.2 96.2 94.4 93.4 93.6  ?PLT 237 245 236 216 211  ? ?Basic Metabolic Panel: ?Recent Labs  ?Lab 07/31/21 ?1005 08/01/21 ?0435  ?NA 137 139  ?K 3.8 3.4*  ?CL 103 107  ?CO2 26 26  ?GLUCOSE 105* 93  ?BUN 41* 28*  ?CREATININE 0.81 0.77  ?CALCIUM 8.0* 8.2*  ? ?GFR: ?Estimated Creatinine Clearance: 59.4 mL/min (by C-G formula based on SCr of 0.77 mg/dL). ?Liver Function Tests: ?Recent Labs  ?Lab 07/31/21 ?1005  ?AST 22  ?ALT 12  ?ALKPHOS 42  ?BILITOT 0.6  ?PROT 5.0*  ?ALBUMIN 3.0*  ? ?No results for input(s): LIPASE, AMYLASE in  the last 168 hours. ?No results for input(s): AMMONIA in the last 168 hours. ?Coagulation Profile: ?No results for input(s): INR, PROTIME in the last 168 hours. ?Cardiac Enzymes: ?No results for input(s): CKTOTAL, CKMB, CKMBINDEX, TROPONINI in the last 168 hours. ?BNP (last 3 results) ?No results for input(s): PROBNP in the last 8760 hours. ?HbA1C: ?No results for input(s): HGBA1C in the last 72 hours. ?CBG: ?Recent Labs  ?Lab 07/31/21 ?1017 08/01/21 ?0810  ?GLUCAP 76 83  ? ?Lipid Profile: ?No results for input(s): CHOL, HDL, LDLCALC, TRIG, CHOLHDL, LDLDIRECT in the last 72 hours. ?Thyroid Function Tests: ?No results for  input(s): TSH, T4TOTAL, FREET4, T3FREE, THYROIDAB in the last 72 hours. ?Anemia Panel: ?Recent Labs  ?  07/31/21 ?1005 07/31/21 ?1010 07/31/21 ?1040  ?VITAMINB12  --  214  --   ?FOLATE  --   --  14.4  ?FERR

## 2021-08-01 NOTE — Evaluation (Signed)
Occupational Therapy Evaluation ?Patient Details ?Name: Kevin Page ?MRN: [id number] ?DOB: Sep 23, 1930 ?Today's Date: 08/01/2021 ? ? ?History of Present Illness pt is an 88 y/o male admitted 3/16 with syncope/unresponsiveness.  Work up found anemia.  MRI pending.  PMHx MCI, BPH, HTN, TIA's, rectal bleed.  ? ?Clinical Impression ?  ?Pt currently needs mod to max assist for simulated selfcare tasks sit to stand and for taking a few steps up the EOB.  At home his spouse assisted a lot with bathing and dressing, however she reports he would sit in his transport chair during the day but ambulate to the bathroom by himself when he needed to go.  Currently, he would benefit from acute care OT to address his decreased independence with selfcare tasks as well as his decreased balance.  Feel that because of his decline in status and his wife only being able to provide minimal assistance, he will likely benefit from SNF follow-up.  Both pt and spouse are agreeable and he has been to [facility] before for rehab.    ?   ? ?Recommendations for follow up therapy are one component of a multi-disciplinary discharge planning process, led by the attending physician.  Recommendations may be updated based on patient status, additional functional criteria and insurance authorization.  ? ?Follow Up Recommendations ? Skilled nursing-short term rehab (<3 hours/day)  ?  ?Assistance Recommended at Discharge Frequent or constant Supervision/Assistance  ?Patient can return home with the following A little help with walking and/or transfers;A lot of help with bathing/dressing/bathroom;Assistance with cooking/housework;Assist for transportation;Direct supervision/assist for medications management;Help with stairs or ramp for entrance ? ?  ?Functional Status Assessment ? Patient has had a recent decline in their functional status and demonstrates the ability to make significant improvements in function in a reasonable and predictable amount of time.   ?Equipment Recommendations ? None recommended by OT  ?  ?Recommendations for Other Services   ? ? ?  ?Precautions / Restrictions Precautions ?Precautions: Fall ?Restrictions ?Weight Bearing Restrictions: No  ? ?  ? ?Mobility Bed Mobility ?Overal bed mobility: Needs Assistance ?Bed Mobility: Supine to Sit, Sit to Supine ?  ?  ?Supine to sit: Mod assist ?Sit to supine: Mod assist ?  ?  ?  ? ?Transfers ?Overall transfer level: Needs assistance ?  ?Transfers: Sit to/from Stand, Bed to chair/wheelchair/BSC ?Sit to Stand: Mod assist ?Stand pivot transfers: Mod assist ?  ?  ?  ?  ?General transfer comment: Pt's LEs scissoring when taking steps up the EOB. ?  ? ?  ?Balance Overall balance assessment: Needs assistance ?Sitting-balance support: No upper extremity supported, Single extremity supported ?Sitting balance-Leahy Scale: Fair ?  ?  ?Standing balance support: During functional activity, Bilateral upper extremity supported ?Standing balance-Leahy Scale: Poor ?  ?  ?  ?  ?  ?  ?  ?  ?  ?  ?  ?  ?   ? ?ADL either performed or assessed with clinical judgement  ? ?ADL Overall ADL's : Needs assistance/impaired ?Eating/Feeding: Set up;Sitting ?Eating/Feeding Details (indicate cue type and reason): simulated ?Grooming: Set up;Wash/dry face;Wash/dry hands;Sitting ?Grooming Details (indicate cue type and reason): simulated ?  ?  ?  ?  ?  ?  ?Lower Body Dressing: Maximal assistance;Sitting/lateral leans ?Lower Body Dressing Details (indicate cue type and reason): donning gripper socks EOB ?Toilet Transfer: Moderate assistance ?Toilet Transfer Details (indicate cue type and reason): simulated stepping up EOB. ?Toileting- Clothing Manipulation and Hygiene: +2 for physical assistance;Moderate assistance;Sit to/from stand ?  Toileting - Clothing Manipulation Details (indicate cue type and reason): bowel incontinence noted ?  ?  ?  ?General ADL Comments: Limited session secondary to pt getting ready to receive blood transfusion and pt  reporting general fatigue.  BP in supine 124/73 and then in sitting at 122/70.  Decreased ability to reach his feet for donning socks, however wife reports that he doesn't wear any at home.  She repors assisting him with 50-75% of dressing tasks, but he was able to walk from his wheelchair to the bathroom without her assistance before discharge.  When standing to take a couple steps up toward the top of the bed, he scissored his RLE in front of the left and exhibited increased lean and LOB to the left.  ? ? ? ?Vision Baseline Vision/History: 1 Wears glasses;6 Macular Degeneration ?Ability to See in Adequate Light: 2 Moderately impaired ?Patient Visual Report: No change from baseline ?Vision Assessment?: No apparent visual deficits  ?   ?Perception Perception ?Perception: Within Functional Limits ?  ?Praxis Praxis ?Praxis: Intact ?  ? ?Pertinent Vitals/Pain Pain Assessment ?Pain Assessment: No/denies pain  ? ? ? ?Hand Dominance Right ?  ?Extremity/Trunk Assessment Upper Extremity Assessment ?Upper Extremity Assessment: Generalized weakness;LUE deficits/detail;RUE deficits/detail ?RUE Deficits / Details: Shoulder flexion 0-110 degrees, decreased FM coordination secondary to hx of Dupuytren's contractures.  Overall strength 3+/5 throughout. ?RUE Coordination: decreased fine motor ?LUE Deficits / Details: Shoulder flexion 0-100 degrees with crepitus noted at the glenohumeral joint.  He exhibits indext finger DIP contracture at 90 degrees flexion secondary to hx of Dupuytrens contracture. ?LUE Coordination: decreased fine motor ?  ?Lower Extremity Assessment ?Lower Extremity Assessment: Defer to PT evaluation ?  ?Cervical / Trunk Assessment ?Cervical / Trunk Assessment: Kyphotic ?  ?Communication Communication ?Communication: No difficulties ?  ?Cognition Arousal/Alertness: Awake/alert ?Behavior During Therapy: Flat affect (Pt reports being fatigued) ?Overall Cognitive Status: History of cognitive impairments - at  baseline ?  ?  ?  ?  ?  ?  ?  ?  ?  ?  ?  ?  ?  ?  ?  ?  ?  ?  ?  ?   ?   ?   ? ? ?Home Living Family/patient expects to be discharged to:: Private residence ?Living Arrangements: Spouse/significant other ?Available Help at Discharge: Family;Available 24 hours/day ?Type of Home: House (townhome) ?Home Access: Stairs to enter ?  ?  ?Home Layout: Two level;Able to live on main level with bedroom/bathroom ?  ?  ?Bathroom Shower/Tub: Walk-in shower ?  ?Bathroom Toilet: Handicapped height ?Bathroom Accessibility: Yes ?  ?Home Equipment: Wheelchair - manual;Shower seat;Cane - single point;Grab bars - tub/shower;Rollator (4 wheels) ?  ?Additional Comments: not sure of RW ?  ? ?  ?Prior Functioning/Environment Prior Level of Function : Needs assist ? Cognitive Assist : ADLs (cognitive) ?  ?  ?Physical Assist : Mobility (physical) ?Mobility (physical): Bed mobility;Transfers;Gait;Stairs ?  ?Mobility Comments: pt is ambulatory short distance in the home without AD, sit to stand without assist.  bed mobility mod I ?ADLs Comments: Stands in the shower with wife outside to cue, can get on/off toilet without assist. ?  ? ?  ?  ?OT Problem List: Decreased strength;Impaired balance (sitting and/or standing);Decreased activity tolerance ?  ?   ?OT Treatment/Interventions: Self-care/ADL training;Balance training;DME and/or AE instruction;Neuromuscular education;Energy conservation;Therapeutic activities;Cognitive remediation/compensation;Patient/family education  ?  ?OT Goals(Current goals can be found in the care plan section) Acute Rehab OT Goals ?Patient Stated Goal: Pt did  not state, but both pt and spouse are open to SNF for short term. ?OT Goal Formulation: With patient/family ?Time For Goal Achievement: 08/15/21 ?Potential to Achieve Goals: Good  ?OT Frequency: Min 2X/week ?  ? ?   ?AM-PAC OT "6 Clicks" Daily Activity     ?Outcome Measure Help from another person eating meals?: None ?Help from another person taking care of  personal grooming?: A Little ?Help from another person toileting, which includes using toliet, bedpan, or urinal?: A Lot ?Help from another person bathing (including washing, rinsing, drying)?: A Lot ?Help fro

## 2021-08-01 NOTE — Evaluation (Signed)
Physical Therapy Evaluation ?Patient Details ?Name: Kevin Page ?MRN: [id number] ?DOB: 25-Jul-1930 ?Today's Date: 08/01/2021 ? ?History of Present Illness ? pt is an 88 y/o male admitted 3/16 with syncope/unresponsiveness.  Work up found anemia.  MRI pending.  PMHx MCI, BPH, HTN, TIA's, rectal bleed.  ?Clinical Impression ? Pt admitted with/for syncope, work up progressing.  Pt not at baseline functional mobility needing min to light moderate assist.  Pt currently limited functionally due to the problems listed below.  (see problems list.)  Pt will benefit from PT to maximize function and safety to be able to get home safely with available assist. ?   ?   ? ?Recommendations for follow up therapy are one component of a multi-disciplinary discharge planning process, led by the attending physician.  Recommendations may be updated based on patient status, additional functional criteria and insurance authorization. ? ?Follow Up Recommendations Home health PT ? ?  ?Assistance Recommended at Discharge Frequent or constant Supervision/Assistance  ?Patient can return home with the following ? A little help with walking and/or transfers;A little help with bathing/dressing/bathroom;Assistance with cooking/housework;Direct supervision/assist for medications management;Direct supervision/assist for financial management;Assist for transportation;Help with stairs or ramp for entrance ? ?  ?Equipment Recommendations None recommended by PT  ?Recommendations for Other Services ?    ?  ?Functional Status Assessment Patient has had a recent decline in their functional status and demonstrates the ability to make significant improvements in function in a reasonable and predictable amount of time.  ? ?  ?Precautions / Restrictions Precautions ?Precautions: Fall  ? ?  ? ?Mobility ? Bed Mobility ?Overal bed mobility: Needs Assistance ?Bed Mobility: Supine to Sit, Sit to Supine ?  ?  ?Supine to sit: Supervision (with rail and flat bed) ?Sit to  supine: Min guard ?  ?  ?  ? ?Transfers ?Overall transfer level: Needs assistance ?Equipment used: Straight cane ?Transfers: Sit to/from Stand, Bed to chair/wheelchair/BSC ?Sit to Stand: Min assist ?Stand pivot transfers: Mod assist ?  ?  ?  ?  ?General transfer comment: 1-2 rocking motions to build momentum, assist for forward and boost ?  ? ?Ambulation/Gait ?Ambulation/Gait assistance: Min assist ?Gait Distance (Feet): 22 Feet ?Assistive device: IV Pole, Straight cane ?Gait Pattern/deviations: Step-through pattern ?  ?Gait velocity interpretation: <1.8 ft/sec, indicate of risk for recurrent falls ?  ?General Gait Details: unsteady steps overall, short, tentative, wider steps.  pt reporting need to get to a chair. ? ?Stairs ?  ?  ?  ?  ?  ? ?Wheelchair Mobility ?  ? ?Modified Rankin (Stroke Patients Only) ?  ? ?  ? ?Balance Overall balance assessment: Needs assistance ?Sitting-balance support: No upper extremity supported, Single extremity supported ?Sitting balance-Leahy Scale: Fair ?  ?  ?Standing balance support: Single extremity supported, Bilateral upper extremity supported, During functional activity ?Standing balance-Leahy Scale: Poor ?Standing balance comment: needing AD and external support today ?  ?  ?  ?  ?  ?  ?  ?  ?  ?  ?  ?   ? ? ? ?Pertinent Vitals/Pain Pain Assessment ?Pain Assessment: No/denies pain  ? ? ?Home Living Family/patient expects to be discharged to:: Private residence ?Living Arrangements: Spouse/significant other ?Available Help at Discharge: Family;Available 24 hours/day ?Type of Home: House ?Home Access: Stairs to enter ?  ?  ?  ?Home Layout: Two level;Able to live on main level with bedroom/bathroom ?Home Equipment: Wheelchair - manual;Shower seat;BSC/3in1;Cane - single point ?Additional Comments: not sure of RW  ?  ?  Prior Function Prior Level of Function : Needs assist ? Cognitive Assist : ADLs (cognitive) ?  ?  ?Physical Assist : Mobility (physical) ?Mobility (physical): Bed  mobility;Transfers;Gait;Stairs ?  ?Mobility Comments: pt is ambulatory short distance in the home without AD, sit to stand without assist.  bed mobility mod I ?ADLs Comments: Stands in the shower with wife outside to cue, can get on/off toilet without assist. ?  ? ? ?Hand Dominance  ?   ? ?  ?Extremity/Trunk Assessment  ? Upper Extremity Assessment ?Upper Extremity Assessment: Defer to OT evaluation (functional general weakness) ?  ? ?Lower Extremity Assessment ?Lower Extremity Assessment: Generalized weakness (no overt focal weaknesses) ?  ? ?Cervical / Trunk Assessment ?Cervical / Trunk Assessment: Kyphotic  ?Communication  ? Communication: No difficulties  ?Cognition Arousal/Alertness: Awake/alert ?Behavior During Therapy: Restless, WFL for tasks assessed/performed (mild impatience) ?Overall Cognitive Status: History of cognitive impairments - at baseline ?  ?  ?  ?  ?  ?  ?  ?  ?  ?  ?  ?  ?  ?  ?  ?  ?General Comments: wife deferred SLP cognitive stating pt not significantly different from baseline cognition. ?  ?  ? ?  ?General Comments General comments (skin integrity, edema, etc.): vss, pt's wife present through out. ? ?  ?Exercises    ? ?Assessment/Plan  ?  ?PT Assessment Patient needs continued PT services  ?PT Problem List Decreased strength;Decreased activity tolerance;Decreased balance;Decreased mobility;Decreased knowledge of use of DME ? ?   ?  ?PT Treatment Interventions DME instruction;Gait training;Stair training;Functional mobility training;Therapeutic activities;Patient/family education;Balance training   ? ?PT Goals (Current goals can be found in the Care Plan section)  ?Acute Rehab PT Goals ?Patient Stated Goal: wife wants to get pt home as soon as possible to keep cognition from degrading ?PT Goal Formulation: With patient/family ?Potential to Achieve Goals: Good ? ?  ?Frequency Min 3X/week ?  ? ? ?Co-evaluation   ?  ?  ?  ?  ? ? ?  ?AM-PAC PT "6 Clicks" Mobility  ?Outcome Measure Help needed  turning from your back to your side while in a flat bed without using bedrails?: A Little ?Help needed moving from lying on your back to sitting on the side of a flat bed without using bedrails?: A Little ?Help needed moving to and from a bed to a chair (including a wheelchair)?: A Lot ?Help needed standing up from a chair using your arms (e.g., wheelchair or bedside chair)?: A Little ?Help needed to walk in hospital room?: A Little ?Help needed climbing 3-5 steps with a railing? : A Lot ?6 Click Score: 16 ? ?  ?End of Session   ?Activity Tolerance: Patient tolerated treatment well;Patient limited by fatigue ?Patient left: in bed;with call bell/phone within reach;with nursing/sitter in room ?Nurse Communication: Mobility status ?PT Visit Diagnosis: Unsteadiness on feet (R26.81);Muscle weakness (generalized) (M62.81);Difficulty in walking, not elsewhere classified (R26.2) ?  ? ?Time: 1021-1100 ?PT Time Calculation (min) (ACUTE ONLY): 39 min ? ? ?Charges:   PT Evaluation ?$PT Eval Moderate Complexity: 1 Mod ?PT Treatments ?$Gait Training: 8-22 mins ?$Therapeutic Activity: 8-22 mins ?  ?   ? ? ?08/01/2021 ? ?Ginger Carne., PT ?Acute Rehabilitation Services ?[phone]  (pager) ?[phone]  (office) ? ?Tessie Fass Chayil Gantt ?08/01/2021, 11:16 AM ? ?

## 2021-08-02 DIAGNOSIS — R5381 Other malaise: Secondary | ICD-10-CM

## 2021-08-02 DIAGNOSIS — N400 Enlarged prostate without lower urinary tract symptoms: Secondary | ICD-10-CM | POA: Diagnosis not present

## 2021-08-02 DIAGNOSIS — D518 Other vitamin B12 deficiency anemias: Secondary | ICD-10-CM | POA: Diagnosis not present

## 2021-08-02 DIAGNOSIS — E538 Deficiency of other specified B group vitamins: Secondary | ICD-10-CM | POA: Diagnosis not present

## 2021-08-02 DIAGNOSIS — R55 Syncope and collapse: Secondary | ICD-10-CM | POA: Diagnosis not present

## 2021-08-02 LAB — BASIC METABOLIC PANEL
Anion gap: 7 (ref 5–15)
BUN: 16 mg/dL (ref 8–23)
CO2: 23 mmol/L (ref 22–32)
Calcium: 8.3 mg/dL — ABNORMAL LOW (ref 8.9–10.3)
Chloride: 108 mmol/L (ref 98–111)
Creatinine, Ser: 0.74 mg/dL (ref 0.61–1.24)
GFR, Estimated: >60 mL/min (ref >60)
Glucose, Bld: 84 mg/dL (ref 70–99)
Potassium: 3.7 mmol/L (ref 3.5–5.1)
Sodium: 138 mmol/L (ref 135–145)

## 2021-08-02 LAB — BPAM RBC
Blood Product Expiration Date: 202303252359
ISSUE DATE / TIME: 202303171758
Unit Type and Rh: 6200

## 2021-08-02 LAB — TYPE AND SCREEN
ABO/RH(D): A POS
Antibody Screen: NEGATIVE
Unit division: 0

## 2021-08-02 LAB — CBC
HCT: 27.7 % — ABNORMAL LOW (ref 39.0–52.0)
Hemoglobin: 9.2 g/dL — ABNORMAL LOW (ref 13.0–17.0)
MCH: 30.5 pg (ref 26.0–34.0)
MCHC: 33.2 g/dL (ref 30.0–36.0)
MCV: 91.7 fL (ref 80.0–100.0)
Platelets: 212 10*3/uL (ref 150–400)
RBC: 3.02 MIL/uL — ABNORMAL LOW (ref 4.22–5.81)
RDW: 16.3 % — ABNORMAL HIGH (ref 11.5–15.5)
WBC: 9.2 10*3/uL (ref 4.0–10.5)
nRBC: 0 % (ref 0.0–0.2)

## 2021-08-02 LAB — MAGNESIUM: Magnesium: 1.8 mg/dL (ref 1.7–2.4)

## 2021-08-02 LAB — GLUCOSE, CAPILLARY: Glucose-Capillary: 98 mg/dL (ref 70–99)

## 2021-08-02 MED ORDER — SODIUM CHLORIDE 0.9 % IV BOLUS
1000.0000 mL | Freq: Once | INTRAVENOUS | Status: AC
  Administered 2021-08-02: 1000 mL via INTRAVENOUS

## 2021-08-02 MED ORDER — MIRABEGRON ER 25 MG PO TB24
25.0000 mg | ORAL_TABLET | Freq: Every day | ORAL | Status: DC
Start: 2021-08-03 — End: 2021-08-08
  Administered 2021-08-03 – 2021-08-07 (×5): 25 mg via ORAL
  Filled 2021-08-02 (×5): qty 1

## 2021-08-02 NOTE — Progress Notes (Addendum)
?PROGRESS NOTE ? ? ? ?Kevin Page  JFH:[id number] DOB: [birth year] DOA: 07/31/2021 ?PCP: Ginger Organ., MD  ? ? ?Brief Narrative:  ?Kevin Page is a 88 year old male with past medical history significant for mild cognitive impairment, BPH, essential hypertension, history of TIA, history of rectal bleed 2022 presumed to be diverticular, history of SVT who presented to [facility] ED on 3/16 via EMS with reported syncopal episode.  Spouse reports she went to get him up around 8:30 AM and told him to go to the bathroom and he was apparently nonresponsive.  He would not smile, raise his arms and just stared at her and subsequently spouse called EMS.  On the way out of the house, patient had a syncopal episode where he got stiff and rigid, no seizure-like activity reported.  Patient denied any prodromal symptoms such as dizziness, no chest pain, no palpitations.  On EMS arrival, BP was noted 98/58 and was given a 500 cc bolus.  Wife states he presented similarly in the past with TIAs.  On arrival to the ED, patient's symptoms were completely resolved.  He further denied pain, no recent illness/fever, no chills, no headache, no dizziness, no chest pain, no palpitations, no shortness of breath, no cough, no abdominal pain, no nausea/vomiting/diarrhea, no dysuria/urinary symptoms and no leg swelling. ? ?In the ED, temperature 98.3 ?F, HR 69, RR 16, BP 113/72, SPO2 95% on room air.  WBC 13.4, hemoglobin 9.1, platelets 237.  Sodium 137, potassium 3.8, chloride 103, CO2 26, glucose 105, BUN 41, creatinine 0.81.  AST 22, ALT 12, total bilirubin 0.6.  Lactic acid 1.7.  COVID-19 PCR negative.  Influenza A/B PCR negative.  Urinalysis unrevealing.  CT head without contrast with chronic changes with no acute intracranial process.  Chest x-ray with no evidence of acute cardiopulmonary disease process.  Initial FOBT negative, repeat positive.  Positive orthostatic vital signs.  EDP started patient on IV fluids.  TRH consulted for further  evaluation and management of syncope, GI bleed.  ? ? ? ?Assessment and Plan: ?* Syncope ?Patient presenting to the ED following witnessed syncopal episode x2 with unresponsiveness.  No reported seizure-like activity.  Was notably orthostatic with low BP on EMS arrival.  Urinalysis, chest x-ray unrevealing.  CT head without contrast with no acute intracranial findings.  TTE with LVEF 65-70%, no regional wall motion abnormalities, mild AR.  MR brain without contrast with no evidence of acute intracranial abnormality, notable for cerebral atrophy.  Etiology likely secondary to orthostatic hypotension from dehydration in the setting of cognitive impairment versus autonomic dysfunction.  Also complicated by anemia. ?--Continue to monitor on telemetry ?--daily orthostatic vital signs ?--TED hose, supportive care ?--PT/OT evaluation ?--Fall precautions ? ?Anemia, GI Bleed ?Hemoglobin on arrival 9.1 (baseline 11-12).  History of GI bleed in the past treated conservatively, presumed diverticular.  Denies NSAID abuse, only on aspirin outpatient and no other anticoagulant/antiplatelet.  Initial FOBT was negative, repeat positive.  Patient with elevated BUN on arrival.  Anemia panel with iron 68, TIBC 232 (low), ferritin 33, folate 14.4, B12 214 (low normal).  Seen by [facility] gastroenterology, recommend conservative measures at this time with plan outpatient follow-up in 3-4 weeks. ?--Hgb 9.1>9.0>8.3>8.0>8.3>7.2>9.2 ?--s/p 1u pRBC 3/17 ?--holding aspirin ?--Protonix '40mg'$  IV q12h ?--Transfuse for hemoglobin< 7.0 or active bleeding ?--Repeat hemoglobin in the a.m. ? ?Elevated troponin ?High sensitive troponin 30> 37> 47.  Denies chest pain.  No concerning dynamic changes on EKG. ?--Continue monitor on telemetry ? ?B12 deficiency ?Vitamin B12 214,  low normal. ?--Started on Vitamin B12 1000 mcg p.o. daily ? ?Cognitive impairment ?Spouse reports mild cognitive impairment.  On Aricept outpatient, currently holding as small percentage  associated with syncope. ?--SLP for cognitive evaluation ?--Delirium precautions ?--Get up during the day ?--Encourage a familiar face to remain present throughout the day ?--Keep blinds open and lights on during daylight hours ?--Minimize the use of opioids/benzodiazepines ? ?BPH (benign prostatic hyperplasia) ?--Continue avodart  ? ?Physical deconditioning ?--Continue PT/OT efforts while inpatient ?--TOC consulted for SNF placement, spouse prefers [facility] ? ? ? ?DVT prophylaxis: SCDs Start: 07/31/21 1350 ?Place TED hose Start: 07/31/21 1350 ? ?  Code Status: DNR ?Family Communication: Updated spouse present at bedside this morning ? ?Disposition Plan:  ?Level of care: Telemetry Medical ?Status is: Inpatient ?Remains inpatient appropriate because: Continue to trend hemoglobin, pending MRI brain and PT/OT/SLP evaluation. ?  ? ?Consultants:  ?[facility] ? ?Procedures:  ?TTE ? ?Antimicrobials:  ?None ? ? ?Subjective: ?Patient seen examined bedside, resting comfortably.  Pleasantly confused.  Spouse present.  Now requesting SNF placement.  Following blood transfusion, hemoglobin up to 9.2 this morning.  No other specific complaints or concerns at this time.  GI continues to plan conservative treatment with Protonix with planned outpatient follow-up.  Patient denies headache, no dizziness, no chest pain, no palpitations, no shortness of breath, no fever/chills/night sweats, no nausea/vomiting/diarrhea, no weakness, no fatigue, no paresthesias, no cough/congestion.  RN reports small tarry smear BM overnight, otherwise no acute events overnight per nursing staff. ? ?Objective: ?Vitals:  ? 08/01/21 2304 08/02/21 0415 08/02/21 0500 08/02/21 0758  ?BP: 126/70 (!) 127/54  122/82  ?Pulse: 82 82  75  ?Resp:  20  14  ?Temp: 98.7 ?F (37.1 ?C) 98.2 ?F (36.8 ?C)  98.2 ?F (36.8 ?C)  ?TempSrc: Oral Oral  Oral  ?SpO2: 99% 94%  99%  ?Weight:   66.1 kg   ?Height:      ? ? ?Intake/Output Summary (Last 24 hours) at  08/02/2021 1044 ?Last data filed at 08/02/2021 0420 ?Gross per 24 hour  ?Intake 652 ml  ?Output 800 ml  ?Net -148 ml  ? ?Filed Weights  ? 07/31/21 1001 08/02/21 0500  ?Weight: 65.8 kg 66.1 kg  ? ? ?Examination: ? ?Physical Exam: ?GEN: NAD, alert and oriented to person Microbiologist Biden), [facility]) but not time (September 2034), elderly in appearance ?HEENT: NCAT, PERRL, EOMI, sclera clear, MMM ?PULM: CTAB w/o wheezes/crackles, normal respiratory effort, on room air ?CV: RRR w/o M/G/R ?GI: abd soft, NTND, NABS, no R/G/M ?MSK: no peripheral edema, muscle strength globally intact 5/5 bilateral upper/lower extremities ?NEURO: CN II-XII intact, no focal deficits, sensation to light touch intact ?PSYCH: normal mood/affect ?Integumentary: dry/intact, no rashes or wounds ? ? ? ?Data Reviewed: I have personally reviewed following labs and imaging studies ? ?CBC: ?Recent Labs  ?Lab 07/31/21 ?1005 07/31/21 ?1349 07/31/21 ?2030 08/01/21 ?0435 08/01/21 ?0732 08/01/21 ?1352 08/02/21 ?4034  ?WBC 13.4* 15.6* 10.6* 8.7 9.5  --  9.2  ?NEUTROABS 11.1*  --   --   --   --   --   --   ?HGB 9.1* 9.0* 8.3* 8.0* 8.3* 7.2* 9.2*  ?HCT 27.6* 28.0* 25.4* 23.9* 24.8* 22.2* 27.7*  ?MCV 95.2 96.2 94.4 93.4 93.6  --  91.7  ?PLT 237 245 236 216 211  --  212  ? ?Basic Metabolic Panel: ?Recent Labs  ?Lab 07/31/21 ?1005 08/01/21 ?0435 08/02/21 ?7425  ?NA 137 139 138  ?K 3.8 3.4* 3.7  ?  CL 103 107 108  ?CO2 '26 26 23  '$ ?GLUCOSE 105* 93 84  ?BUN 41* 28* 16  ?CREATININE 0.81 0.77 0.74  ?CALCIUM 8.0* 8.2* 8.3*  ?MG  --   --  1.8  ? ?GFR: ?Estimated Creatinine Clearance: 59.7 mL/min (by C-G formula based on SCr of 0.74 mg/dL). ?Liver Function Tests: ?Recent Labs  ?Lab 07/31/21 ?1005  ?AST 22  ?ALT 12  ?ALKPHOS 42  ?BILITOT 0.6  ?PROT 5.0*  ?ALBUMIN 3.0*  ? ?No results for input(s): LIPASE, AMYLASE in the last 168 hours. ?No results for input(s): AMMONIA in the last 168 hours. ?Coagulation Profile: ?No results for input(s): INR, PROTIME in the  last 168 hours. ?Cardiac Enzymes: ?No results for input(s): CKTOTAL, CKMB, CKMBINDEX, TROPONINI in the last 168 hours. ?BNP (last 3 results) ?No results for input(s): PROBNP in the last 8760 hours. ?HbA1C: ?

## 2021-08-02 NOTE — Progress Notes (Signed)
Physical Therapy Treatment ?Patient Details ?Name: Kevin Page ?MRN: [id number] ?DOB: March 07, 1931 ?Today's Date: 08/02/2021 ? ? ?History of Present Illness pt is an 88 y/o male admitted 3/16 with syncope/unresponsiveness.  Work up found anemia.  MRI pending.  PMHx MCI, BPH, HTN, TIA's, rectal bleed. ? ?  ?PT Comments  ? ? Wife present during session and expressing some concern about taking pt home. She currently provides a fair amount of assist for pt, however is unable to physically assist with getting OOB or ambulation. Pt was able to slowly and laboriously progress OOB w/o assist however once standing pt was very unsteady. Pt was able to take short tentative steps around foot of bed with one hand on cane and one hand on foot board. Once around the bed he heavily relied on HHA to safely step towards the [facility] before sitting. Pt is at an increased risk of falls and would benefit from a short term rehab stay to maximize functional independence and safety with mobility. D/c recommendations update and discussed with PT.  ?  ?Recommendations for follow up therapy are one component of a multi-disciplinary discharge planning process, led by the attending physician.  Recommendations may be updated based on patient status, additional functional criteria and insurance authorization. ? ?Follow Up Recommendations ? Skilled nursing-short term rehab (<3 hours/day) ?  ?  ?Assistance Recommended at Discharge Frequent or constant Supervision/Assistance  ?Patient can return home with the following A little help with bathing/dressing/bathroom;Assistance with cooking/housework;Direct supervision/assist for medications management;Direct supervision/assist for financial management;Assist for transportation;Help with stairs or ramp for entrance;A lot of help with walking and/or transfers ?  ?Equipment Recommendations ? None recommended by PT  ?  ?Recommendations for Other Services   ? ? ?  ?Precautions / Restrictions  Precautions ?Precautions: Fall ?Restrictions ?Weight Bearing Restrictions: No  ?  ? ?Mobility ? Bed Mobility ?Overal bed mobility: Needs Assistance ?Bed Mobility: Supine to Sit, Sit to Supine ?  ?  ?Supine to sit: Min guard ?Sit to supine: Min assist ?  ?General bed mobility comments: Attempted bed mobilities with bed flat and rails lowered. Pt required significantly increased time and effort to progress OOB to the R. Min A required to return to supine. with mod cueing for technique. ?  ? ?Transfers ?Overall transfer level: Needs assistance ?Equipment used: Straight cane ?Transfers: Sit to/from Stand ?Sit to Stand: From elevated surface, Min assist ?  ?  ?  ?  ?  ?General transfer comment: repeat attempts and min A to rise from elevated surface with cane for support. once up pt unable to fully extend LEs. ?  ? ?Ambulation/Gait ?Ambulation/Gait assistance: Mod assist ?Gait Distance (Feet): 5 Feet ?Assistive device: Straight cane (Foot board of bed) ?Gait Pattern/deviations: Step-to pattern, Shuffle, Trunk flexed ?  ?  ?  ?General Gait Details: unsteady steps overall, short, tentative, wider steps. Unable to progress once past foot board and PTA provided HHA to side step closer to [facility]. ? ? ?Stairs ?  ?  ?  ?  ?  ? ? ?Wheelchair Mobility ?  ? ?Modified Rankin (Stroke Patients Only) ?  ? ? ?  ?Balance Overall balance assessment: Needs assistance ?Sitting-balance support: No upper extremity supported, Single extremity supported ?Sitting balance-Leahy Scale: Fair ?  ?  ?Standing balance support: During functional activity, Bilateral upper extremity supported ?Standing balance-Leahy Scale: Poor ?Standing balance comment: needing AD and external support today ?  ?  ?  ?  ?  ?  ?  ?  ?  ?  ?  ?  ? ?  ?  Cognition Arousal/Alertness: Lethargic (did not sleep well night before) ?Behavior During Therapy: Flat affect (Pt reports being fatigued) ?Overall Cognitive Status: History of cognitive impairments - at baseline ?  ?  ?  ?  ?   ?  ?  ?  ?  ?  ?  ?  ?  ?  ?  ?  ?General Comments: Lethargic and falling asleep during session. Some trouble following commands and frustrated at times when he didn't understand. Wife present and handles him well. ?  ?  ? ?  ?Exercises   ? ?  ?General Comments General comments (skin integrity, edema, etc.): Pt incontinent of bowls and required total A with peri care. ?  ?  ? ?Pertinent Vitals/Pain    ? ? ?Home Living   ?  ?  ?  ?  ?  ?  ?  ?  ?  ?   ?  ?Prior Function    ?  ?  ?   ? ?PT Goals (current goals can now be found in the care plan section) Acute Rehab PT Goals ?Patient Stated Goal: wife wants to get pt home as soon as possible to keep cognition from degrading ?PT Goal Formulation: With patient/family ?Potential to Achieve Goals: Good ?Progress towards PT goals: Progressing toward goals ? ?  ?Frequency ? ? ? Min 3X/week ? ? ? ?  ?PT Plan Discharge plan needs to be updated  ? ? ?Co-evaluation   ?  ?  ?  ?  ? ?  ?AM-PAC PT "6 Clicks" Mobility   ?Outcome Measure ? Help needed turning from your back to your side while in a flat bed without using bedrails?: A Little ?Help needed moving from lying on your back to sitting on the side of a flat bed without using bedrails?: A Little ?Help needed moving to and from a bed to a chair (including a wheelchair)?: A Lot ?Help needed standing up from a chair using your arms (e.g., wheelchair or bedside chair)?: A Little ?Help needed to walk in hospital room?: A Little ?Help needed climbing 3-5 steps with a railing? : A Lot ?6 Click Score: 16 ? ?  ?End of Session Equipment Utilized During Treatment: Gait belt ?Activity Tolerance: Patient tolerated treatment well;Patient limited by fatigue ?Patient left: in bed;with call bell/phone within reach;with family/visitor present ?Nurse Communication: Mobility status;Other (comment) (on bed pan) ?PT Visit Diagnosis: Unsteadiness on feet (R26.81);Muscle weakness (generalized) (M62.81);Difficulty in walking, not elsewhere classified  (R26.2) ?  ? ? ?Time: 9842-1031 ?PT Time Calculation (min) (ACUTE ONLY): 37 min ? ?Charges:  $Gait Training: 8-22 mins ?$Therapeutic Activity: 8-22 mins          ?          ?Benjiman Core, PTA ?Pager [phone] ?Acute Rehab ? ?Allena Katz ?08/02/2021, 1:14 PM ? ?

## 2021-08-02 NOTE — Progress Notes (Signed)
[place] [facility] ROUNDING NOTE ? ? ?Subjective: ?Hemoglobin improved to 9.2 status post 1 unit PRBC transfusion ?Hemoglobin 7.2 yesterday was likely lab error ?Denies any abdominal pain, his appetite is normal.  No evidence of overt bleeding ?Hemoccult positive.  He does have history of gum bleeds per his wife ? ? ?Objective: ?Vital signs in last 24 hours: ?Temp:  [97.6 ?F (36.4 ?C)-98.9 ?F (37.2 ?C)] 98.2 ?F (36.8 ?C) (03/18 0758) ?Pulse Rate:  [75-86] 75 (03/18 0758) ?Resp:  [14-20] 14 (03/18 0758) ?BP: (104-134)/(54-82) 122/82 (03/18 0758) ?SpO2:  [94 %-100 %] 99 % (03/18 0758) ?Weight:  [66.1 kg] 66.1 kg (03/18 0500) ?Last BM Date : 08/01/21 ?General: NAD, alert, oriented x0 ?Abdomen: Soft, no tenderness or distention ?Ext: No edema ? ? ? ?Intake/Output from previous day: ?03/17 0701 - 03/18 0700 ?In: 652 [P.O.:240; I.V.:10; Blood:402] ?Out: 800 [Urine:800] ?Intake/Output this shift: ?No intake/output data recorded. ? ? ?Lab Results: ?Recent Labs  ?  08/01/21 ?0435 08/01/21 ?0732 08/01/21 ?1352 08/02/21 ?6759  ?WBC 8.7 9.5  --  9.2  ?HGB 8.0* 8.3* 7.2* 9.2*  ?PLT 216 211  --  212  ?MCV 93.4 93.6  --  91.7  ? ?BMET ?Recent Labs  ?  07/31/21 ?1005 08/01/21 ?0435 08/02/21 ?1638  ?NA 137 139 138  ?K 3.8 3.4* 3.7  ?CL 103 107 108  ?CO2 '26 26 23  '$ ?GLUCOSE 105* 93 84  ?BUN 41* 28* 16  ?CREATININE 0.81 0.77 0.74  ?CALCIUM 8.0* 8.2* 8.3*  ? ?LFT ?Recent Labs  ?  07/31/21 ?1005  ?PROT 5.0*  ?ALBUMIN 3.0*  ?AST 22  ?ALT 12  ?ALKPHOS 42  ?BILITOT 0.6  ? ?PT/INR ?No results for input(s): INR in the last 72 hours. ? ? ? ?Imaging/Other results: ?CT Head Wo Contrast ? ?Result Date: 07/31/2021 ?CLINICAL DATA:  Altered mental status EXAM: CT HEAD WITHOUT CONTRAST TECHNIQUE: Contiguous axial images were obtained from the base of the skull through the vertex without intravenous contrast. RADIATION DOSE REDUCTION: This exam was performed according to the departmental dose-optimization program which includes automated exposure  control, adjustment of the mA and/or kV according to patient size and/or use of iterative reconstruction technique. COMPARISON:  CT head 04/25/2021 FINDINGS: Brain: No acute intracranial hemorrhage, mass effect, or herniation. No extra-axial fluid collections. No evidence of acute territorial infarct. No hydrocephalus. Moderate cortical volume loss. Patchy hypodensities in the periventricular and subcortical white matter, likely secondary to chronic microvascular ischemic changes. Vascular: No hyperdense vessel or unexpected calcification. Skull: Normal. Negative for fracture or focal lesion. Sinuses/Orbits: No acute finding. Other: None. IMPRESSION: Chronic changes with no acute intracranial process identified. Electronically Signed   By: Ofilia Neas M.D.   On: 07/31/2021 11:30  ? ?MR BRAIN WO CONTRAST ? ?Result Date: 08/01/2021 ?CLINICAL DATA:  Mental status change, persistent or worsening EXAM: MRI HEAD WITHOUT CONTRAST TECHNIQUE: Multiplanar, multiecho pulse sequences of the brain and surrounding structures were obtained without intravenous contrast. COMPARISON:  CT head March 16, 23. FINDINGS: Brain: No acute infarction, acute hemorrhage, hydrocephalus, extra-axial collection or mass lesion. Cerebral atrophy with ex vacuo ventricular dilation. Patchy white matter T2/FLAIR hyperintensities, nonspecific but compatible with chronic microvascular ischemic disease. Vascular: Major arterial flow voids are maintained at the skull base. Skull and upper cervical spine: Normal marrow signal. Sinuses/Orbits: Clear sinuses.  No acute orbital findings. Other: Trace right mastoid effusion. IMPRESSION: 1. No evidence of acute intracranial abnormality. 2.  Cerebral atrophy (ICD10-G31.9). Electronically Signed   By: Jamesetta So.D.  On: 08/01/2021 11:56  ? ?ECHOCARDIOGRAM COMPLETE ? ?Result Date: 07/31/2021 ?   ECHOCARDIOGRAM REPORT   Patient Name:   Kevin Page Date of Exam: 07/31/2021 Medical Rec #:  [id number]     Height:       70.0 in Accession #:    [id number]   Weight:       145.0 lb Date of Birth:  [birth year]    BSA:          1.821 m? Patient Age:    74 years     BP:           113/72 mmHg Patient Gender: M            HR:           81 bpm. Exam Location:  Inpatient Procedure: 2D Echo, Cardiac Doppler and Color Doppler Indications:    Syncope  History:        Patient has no prior history of Echocardiogram examinations.                 TIA; Arrythmias:Bradycardia.  Sonographer:    Jyl Heinz Referring Phys: [id number] [place]  1. Left ventricular ejection fraction, by estimation, is 65 to 70%. The left ventricle has normal function. The left ventricle has no regional wall motion abnormalities. Left ventricular diastolic parameters were normal.  2. Right ventricular systolic function is normal. The right ventricular size is normal.  3. The mitral valve is normal in structure. No evidence of mitral valve regurgitation.  4. The aortic valve is tricuspid. Aortic valve regurgitation is mild. FINDINGS  Left Ventricle: Left ventricular ejection fraction, by estimation, is 65 to 70%. The left ventricle has normal function. The left ventricle has no regional wall motion abnormalities. The left ventricular internal cavity size was normal in size. There is  no left ventricular hypertrophy. Left ventricular diastolic parameters were normal. Right Ventricle: The right ventricular size is normal. Right vetricular wall thickness was not well visualized. Right ventricular systolic function is normal. Left Atrium: Left atrial size was normal in size. Right Atrium: Right atrial size was normal in size. Pericardium: There is no evidence of pericardial effusion. Mitral Valve: The mitral valve is normal in structure. No evidence of mitral valve regurgitation. Tricuspid Valve: The tricuspid valve is grossly normal. Tricuspid valve regurgitation is trivial. Aortic Valve: The aortic valve is tricuspid. Aortic valve regurgitation is  mild. Aortic regurgitation PHT measures 590 msec. Aortic valve peak gradient measures 8.0 mmHg. Pulmonic Valve: The pulmonic valve was not well visualized. Pulmonic valve regurgitation is trivial. Aorta: The aortic root and ascending aorta are structurally normal, with no evidence of dilitation. IAS/Shunts: The atrial septum is grossly normal.  LEFT VENTRICLE PLAX 2D LVIDd:         3.80 cm     Diastology LVIDs:         2.10 cm     LV e' medial:    6.74 cm/s LV PW:         1.00 cm     LV E/e' medial:  10.0 LV IVS:        0.80 cm     LV e' lateral:   6.20 cm/s LVOT diam:     2.00 cm     LV E/e' lateral: 10.9 LV SV:         63 LV SV Index:   34 LVOT Area:     3.14 cm?  LV Volumes (MOD) LV vol d, MOD A2C:  74.2 ml LV vol d, MOD A4C: 75.3 ml LV vol s, MOD A2C: 25.7 ml LV vol s, MOD A4C: 25.8 ml LV SV MOD A2C:     48.5 ml LV SV MOD A4C:     75.3 ml LV SV MOD BP:      48.7 ml RIGHT VENTRICLE             IVC RV Basal diam:  3.00 cm     IVC diam: 1.80 cm RV Mid diam:    1.90 cm RV S prime:     19.40 cm/s TAPSE (M-mode): 2.1 cm LEFT ATRIUM             Index        RIGHT ATRIUM           Index LA diam:        2.50 cm 1.37 cm/m?   RA Area:     10.50 cm? LA Vol (A2C):   38.0 ml 20.87 ml/m?  RA Volume:   19.50 ml  10.71 ml/m? LA Vol (A4C):   26.7 ml 14.66 ml/m? LA Biplane Vol: 34.1 ml 18.73 ml/m?  AORTIC VALVE AV Area (Vmax): 2.52 cm? AV Vmax:        141.00 cm/s AV Peak Grad:   8.0 mmHg LVOT Vmax:      113.00 cm/s LVOT Vmean:     81.400 cm/s LVOT VTI:       0.199 m AI PHT:         590 msec  AORTA Ao Root diam: 3.90 cm Ao Asc diam:  3.40 cm MITRAL VALVE MV Area (PHT): 3.70 cm?     SHUNTS MV Decel Time: 205 msec     Systemic VTI:  0.20 m MV E velocity: 67.40 cm/s   Systemic Diam: 2.00 cm MV A velocity: 123.00 cm/s MV E/A ratio:  0.55 Mertie Moores MD Electronically signed by Mertie Moores MD Signature Date/Time: 07/31/2021/4:12:36 PM    Final    ? ? ? ?Assessment &Plan ? ?88 year old very pleasant gentleman with cognitive impairment  admitted after a syncopal episode, it is thought to be secondary to orthostatic hypotension ?MRI negative for CVA or acute intra cerebral pathology ? ?Normocytic anemia: No overt bleeding ?Fecal Hemo

## 2021-08-02 NOTE — Progress Notes (Addendum)
Patient was out of bed in the recliner with feet down to the floor not reclined; vital signs obtained; finding low BP 74/54  pulse 60's; patient awake but drowsy; assisted patient back to bed; repeat BP 115/71 pulse 70; Dr.[place] notified; orders received. Patient coughing to clear his throat; had another smearing of bm; remains black and smeared; not watery; no frank blood; skin care provided. Patient denies shortness of breath or pain. ?

## 2021-08-02 NOTE — Progress Notes (Signed)
?   08/02/21 2125  ?Provider Notification  ?Provider Name/Title Dr Marlowe Sax  ?Date Provider Notified 08/02/21  ?Time Provider Notified 2127  ?Notification Type Page  ?Notification Reason Other (Comment) ?(CCMD called that pt had 6 runs of Vtach)  ?Provider response Other (Comment) ?(awaiting response)  ?Date of Provider Response 08/02/21 ?(With new lab orders)  ?Time of Provider Response 2337  ? ? ?

## 2021-08-02 NOTE — TOC Progression Note (Signed)
Transition of Care (TOC) - Progression Note  ? ? ?Patient Details  ?Name: Ruffin Lada ?MRN: [id number] ?Date of Birth: [birth year] ? ?Transition of Care (TOC) CM/SW Contact  ?Carolin Sicks, LCSWA ?Phone Number: ?08/02/2021, 4:24 PM ? ?Clinical Narrative:    ?CSW spoke with pt's spouse in pt's room.  Pt's spouse only wants [facility] and [facility] for SNF.  CSW informed pt that [facility] does not accept [facility].  CSW sent out in hub to [place] and [place] spoke with Levada Dy with [employer]  concerning pt's disposition planning pt's spouse is aware of CSW speaking with Levada Dy. TOC will continue to assist with disposition planning. ? ? ?Expected Discharge Plan: [place] ?Barriers to Discharge: Continued Medical Work up ? ?Expected Discharge Plan and Services ?Expected Discharge Plan: [place] ?  ?Discharge Planning Services: CM Consult ?Post Acute Care Choice: Home Health ?Living arrangements for the past 2 months: [place] ?                ?  ?  ?  ?  ?  ?HH Arranged: Therapist, sports, PT ?[place] Agency: [place] ?Date HH Agency Contacted: 08/01/21 ?  ?Representative spoke with at [place]: Levada Dy ? ? ?Social Determinants of Health (SDOH) Interventions ?  ? ?Readmission Risk Interventions ?No flowsheet data found. ? ?

## 2021-08-02 NOTE — Progress Notes (Signed)
Patient declined blood draw; encouraged multiple times. Cont to decline. Requested lab to come back and reattempt at a later time.  ?

## 2021-08-02 NOTE — Assessment & Plan Note (Signed)
--  Continue PT/OT efforts while inpatient ?--TOC consulted for SNF placement, spouse prefers [facility] ?

## 2021-08-02 NOTE — Progress Notes (Signed)
Patient noted to have increased confusion throughout the night; looking for his wife. Easy to redirect but forgets easily. Attempted to get oob, removes tele on and off and removes gown.  ?Patient noted to have a couple of tarry stool/smears, very minimal amount (smear). Patient got very upset during clean up, states he does not want to be cleaned up and verbalized inappropriate words/cursing. Explained to patient the necessity of getting cleaned up.  ?

## 2021-08-02 NOTE — TOC Progression Note (Addendum)
Transition of Care (TOC) - Progression Note  ? ? ?Patient Details  ?Name: Christain Niznik ?MRN: [id number] ?Date of Birth: 05/27/1930 ? ?Transition of Care (TOC) CM/SW Contact  ?Carolin Sicks, LCSWA ?Phone Number: ?08/02/2021, 11:25 AM ? ?Clinical Narrative:    ? ?CSW met with pt and spouse at bedside.  Pt's spouse would like for [facility] for spouse.  CSW gave pt's spouse medicare.gov list.  PT and OT will need to reevaluate for possible SNF placement or HH. TOC will continue to assist with disposition planning. ? ?Expected Discharge Plan: [place] ?Barriers to Discharge: Continued Medical Work up ? ?Expected Discharge Plan and Services ?Expected Discharge Plan: [place] ?  ?Discharge Planning Services: CM Consult ?Post Acute Care Choice: Home Health ?Living arrangements for the past 2 months: [place] ?                ?  ?  ?  ?  ?  ?HH Arranged: Therapist, sports, PT ?[place] Agency: [place] ?Date HH Agency Contacted: 08/01/21 ?  ?Representative spoke with at [place]: Levada Dy ? ? ?Social Determinants of Health (SDOH) Interventions ?  ? ?Readmission Risk Interventions ?No flowsheet data found. ? ?

## 2021-08-03 DIAGNOSIS — R55 Syncope and collapse: Secondary | ICD-10-CM | POA: Diagnosis not present

## 2021-08-03 LAB — HEMOGLOBIN AND HEMATOCRIT, BLOOD
HCT: 26.3 % — ABNORMAL LOW (ref 39.0–52.0)
Hemoglobin: 9.1 g/dL — ABNORMAL LOW (ref 13.0–17.0)

## 2021-08-03 LAB — MAGNESIUM: Magnesium: 1.9 mg/dL (ref 1.7–2.4)

## 2021-08-03 LAB — POTASSIUM: Potassium: 3.5 mmol/L (ref 3.5–5.1)

## 2021-08-03 MED ORDER — GERHARDT'S BUTT CREAM
TOPICAL_CREAM | Freq: Two times a day (BID) | CUTANEOUS | Status: DC
  Filled 2021-08-03: qty 1

## 2021-08-03 MED ORDER — PANTOPRAZOLE SODIUM 40 MG PO TBEC
40.0000 mg | DELAYED_RELEASE_TABLET | Freq: Two times a day (BID) | ORAL | Status: DC
  Administered 2021-08-03 – 2021-08-08 (×10): 40 mg via ORAL
  Filled 2021-08-03 (×10): qty 1

## 2021-08-03 MED ORDER — POTASSIUM CHLORIDE 20 MEQ PO PACK
40.0000 meq | PACK | Freq: Once | ORAL | Status: DC
  Filled 2021-08-03: qty 2

## 2021-08-03 MED ORDER — PANTOPRAZOLE SODIUM 40 MG PO TBEC: 40.0000 mg | DELAYED_RELEASE_TABLET | Freq: Every day | ORAL | Status: DC

## 2021-08-03 NOTE — Assessment & Plan Note (Addendum)
Seen by speech therapy, underwent modified barium swallow on 3/20 with finding of aspiration/penetration due to anatomical difference and cognitive linguistic impairments.  Discussed with patient and spouse that he set risk for aspiration events or developing pneumonia in the future.  S noted that patient would not want his liquids thickened at this time.  Continue aspiration precautions. ?Dysphagia 3 (Mech soft);Thin liquid  ??Liquid Administration via: Cup;Straw ?Medication Administration: Whole meds with puree ?Supervision: Full supervision/cueing for compensatory strategies ?Compensations: Slow rate;Small sips/bites (Avoid consecutive swallows) ?Postural Changes: Seated upright at 90 degrees ?

## 2021-08-03 NOTE — Progress Notes (Addendum)
Inpatient Rehab Admissions Coordinator:  ? ?Per [facility] request,  patient was screened for CIR candidacy by Clemens Catholic, MS, CCC-SLP. At this time, Pt. does not appear to demonstrate medical necessity to justify in hospital rehabilitation/CIR. will not pursue a rehab consult for this Pt. Additionally, Pt.'s payor is very unlikely to approve CIR for his diagnosis.   Recommend other rehab venues to be pursued.  Please contact me with any questions. ? ? ?Clemens Catholic, MS, CCC-SLP ?Rehab Admissions Coordinator  ?[phone] (celll) ?[phone] (office) ? ?

## 2021-08-03 NOTE — Progress Notes (Signed)
?PROGRESS NOTE ? ? ? ?Kevin Page  GUR:[id number] DOB: [birth year] DOA: 07/31/2021 ?PCP: Ginger Organ., MD  ? ? ?Brief Narrative:  ?Kevin Page is a 88 year old male with past medical history significant for mild cognitive impairment, BPH, essential hypertension, history of TIA, history of rectal bleed 2022 presumed to be diverticular, history of SVT who presented to [facility] ED on 3/16 via EMS with reported syncopal episode.  Spouse reports she went to get him up around 8:30 AM and told him to go to the bathroom and he was apparently nonresponsive.  He would not smile, raise his arms and just stared at her and subsequently spouse called EMS.  On the way out of the house, patient had a syncopal episode where he got stiff and rigid, no seizure-like activity reported.  Patient denied any prodromal symptoms such as dizziness, no chest pain, no palpitations.  On EMS arrival, BP was noted 98/58 and was given a 500 cc bolus.  Wife states he presented similarly in the past with TIAs.  On arrival to the ED, patient's symptoms were completely resolved.  He further denied pain, no recent illness/fever, no chills, no headache, no dizziness, no chest pain, no palpitations, no shortness of breath, no cough, no abdominal pain, no nausea/vomiting/diarrhea, no dysuria/urinary symptoms and no leg swelling. ? ?In the ED, temperature 98.3 ?F, HR 69, RR 16, BP 113/72, SPO2 95% on room air.  WBC 13.4, hemoglobin 9.1, platelets 237.  Sodium 137, potassium 3.8, chloride 103, CO2 26, glucose 105, BUN 41, creatinine 0.81.  AST 22, ALT 12, total bilirubin 0.6.  Lactic acid 1.7.  COVID-19 PCR negative.  Influenza A/B PCR negative.  Urinalysis unrevealing.  CT head without contrast with chronic changes with no acute intracranial process.  Chest x-ray with no evidence of acute cardiopulmonary disease process.  Initial FOBT negative, repeat positive.  Positive orthostatic vital signs.  EDP started patient on IV fluids.  TRH consulted for further  evaluation and management of syncope, GI bleed.  ? ? ? ?Assessment and Plan: ?* Syncope and collapse ?Patient presenting to the ED following witnessed syncopal episode x2 with unresponsiveness.  No reported seizure-like activity.  Was notably orthostatic with low BP on EMS arrival.  Urinalysis, chest x-ray unrevealing.  CT head without contrast with no acute intracranial findings.  TTE with LVEF 65-70%, no regional wall motion abnormalities, mild AR.  MR brain without contrast with no evidence of acute intracranial abnormality, notable for cerebral atrophy.  Etiology likely secondary to orthostatic hypotension from dehydration in the setting of cognitive impairment versus autonomic dysfunction.  Also complicated by anemia. ?--Continue to monitor on telemetry ?--TED hose, supportive care ?--PT/OT evaluation ?--Fall precautions ? ?Anemia, GI Bleed ?Hemoglobin on arrival 9.1 (baseline 11-12).  History of GI bleed in the past treated conservatively, presumed diverticular.  Denies NSAID abuse, only on aspirin outpatient and no other anticoagulant/antiplatelet.  Initial FOBT was negative, repeat positive.  Patient with elevated BUN on arrival.  Anemia panel with iron 68, TIBC 232 (low), ferritin 33, folate 14.4, B12 214 (low normal).  Seen by [facility] gastroenterology, recommend conservative measures at this time with plan outpatient follow-up in 3-4 weeks.  Suspected etiology possible gastric ulcer given melena. ?--Hgb 9.1>9.0>8.3>8.0>8.3>7.2>9.2>9.1 ?--s/p 1u pRBC 3/17 ?--holding aspirin ?--Protonix '40mg'$  PO BID x 21 days; followed by '40mg'$  PO daily ?--Transfuse for hemoglobin< 7.0 or active bleeding ?--Repeat hemoglobin in the a.m. ? ?Elevated troponin ?High sensitive troponin 30> 37> 47.  Denies chest pain.  No concerning  dynamic changes on EKG. ?--Continue monitor on telemetry ? ?B12 deficiency ?Vitamin B12 214, low normal. ?--Started on Vitamin B12 1000 mcg p.o. daily ? ?Dementia ([place]) ?Spouse reports mild cognitive  impairment.  On Aricept outpatient, currently holding as small percentage associated with syncope. ?--Delirium precautions ?--Get up during the day ?--Encourage a familiar face to remain present throughout the day ?--Keep blinds open and lights on during daylight hours ?--Minimize the use of opioids/benzodiazepines ? ?BPH (benign prostatic hyperplasia) ?--Continue avodart  ? ?Physical deconditioning ?--Continue PT/OT efforts while inpatient ?--TOC consulted for SNF placement, spouse prefers [facility] ? ?Dysphagia ?Seen by speech therapy with recommendations as follows: ?Dysphagia 3 (Mech soft);Thin liquid  ? Liquid Administration via: Cup;Straw ?Medication Administration: Whole meds with puree ?Supervision: Full supervision/cueing for compensatory strategies ?Compensations: Slow rate;Small sips/bites (Avoid consecutive swallows) ?Postural Changes: Seated upright at 90 degrees ? ? ? ?DVT prophylaxis: SCDs Start: 07/31/21 1350 ?Place TED hose Start: 07/31/21 1350 ? ?  Code Status: DNR ?Family Communication: Updated spouse present at bedside yesterday afternoon ? ?Disposition Plan:  ?Level of care: Telemetry Medical ?Status is: Inpatient ?Remains inpatient appropriate because: Pending SNF placement ?  ? ?Consultants:  ?[facility] ? ?Procedures:  ?TTE ? ?Antimicrobials:  ?None ? ? ?Subjective: ?Patient seen examined bedside, resting comfortably.  Pleasantly confused.  RN present.  Overnight, patient with 6 beat run of V. tach, asymptomatic.  RN concerned about "throat congestion" and requesting speech therapy evaluation.  Patient with no other complaints or concerns at this time.  Hemoglobin stable, 9.1 this morning.  Patient denies headache, no dizziness, no chest pain, no palpitations, no shortness of breath, no fever/chills/night sweats, no nausea/vomiting/diarrhea, no weakness, no fatigue, no paresthesias, no cough/congestion.  RN reports small tarry smear BM overnight, otherwise no acute events  overnight per nursing staff. ? ?Objective: ?Vitals:  ? 08/02/21 2000 08/02/21 2342 08/03/21 0413 08/03/21 0804  ?BP: 112/67 116/71 (!) 118/92 (!) 144/81  ?Pulse: 75 75 70 74  ?Resp: '16 15 16 18  '$ ?Temp: 99 ?F (37.2 ?C) 98.1 ?F (36.7 ?C) 98.2 ?F (36.8 ?C) 98.3 ?F (36.8 ?C)  ?TempSrc: Oral Oral Oral Oral  ?SpO2: 99% 97% 94% 95%  ?Weight:      ?Height:      ? ?No intake or output data in the 24 hours ending 08/03/21 1124 ? ?Filed Weights  ? 07/31/21 1001 08/02/21 0500  ?Weight: 65.8 kg 66.1 kg  ? ? ?Examination: ? ?Physical Exam: ?GEN: NAD, alert and oriented to person Airline pilot), Place [facility]) but not time, elderly in appearance ?HEENT: NCAT, PERRL, EOMI, sclera clear, MMM ?PULM: CTAB w/o wheezes/crackles, normal respiratory effort, on room air ?CV: RRR w/o M/G/R ?GI: abd soft, NTND, NABS, no R/G/M ?MSK: no peripheral edema, muscle strength globally intact 5/5 bilateral upper/lower extremities ?NEURO: CN II-XII intact, no focal deficits, sensation to light touch intact ?PSYCH: normal mood/affect ?Integumentary: dry/intact, no rashes or wounds ? ? ? ?Data Reviewed: I have personally reviewed following labs and imaging studies ? ?CBC: ?Recent Labs  ?Lab 07/31/21 ?1005 07/31/21 ?1349 07/31/21 ?2030 08/01/21 ?0435 08/01/21 ?0732 08/01/21 ?1352 08/02/21 ?6503 08/03/21 ?5465  ?WBC 13.4* 15.6* 10.6* 8.7 9.5  --  9.2  --   ?NEUTROABS 11.1*  --   --   --   --   --   --   --   ?HGB 9.1* 9.0* 8.3* 8.0* 8.3* 7.2* 9.2* 9.1*  ?HCT 27.6* 28.0* 25.4* 23.9* 24.8* 22.2* 27.7* 26.3*  ?MCV 95.2 96.2 94.4  93.4 93.6  --  91.7  --   ?PLT 237 245 236 216 211  --  212  --   ? ?Basic Metabolic Panel: ?Recent Labs  ?Lab 07/31/21 ?1005 08/01/21 ?0435 08/02/21 ?7619 08/03/21 ?5093  ?NA 137 139 138  --   ?K 3.8 3.4* 3.7 3.5  ?CL 103 107 108  --   ?CO2 '26 26 23  '$ --   ?GLUCOSE 105* 93 84  --   ?BUN 41* 28* 16  --   ?CREATININE 0.81 0.77 0.74  --   ?CALCIUM 8.0* 8.2* 8.3*  --   ?MG  --   --  1.8 1.9  ? ?GFR: ?Estimated Creatinine  Clearance: 59.7 mL/min (by C-G formula based on SCr of 0.74 mg/dL). ?Liver Function Tests: ?Recent Labs  ?Lab 07/31/21 ?1005  ?AST 22  ?ALT 12  ?ALKPHOS 42  ?BILITOT 0.6  ?PROT 5.0*  ?ALBUMIN 3.0*  ? ?No res

## 2021-08-03 NOTE — Progress Notes (Signed)
Telemetry monitoring reported 6 beat run of VT; MD notified; continue to monitor. ?

## 2021-08-03 NOTE — Evaluation (Addendum)
Clinical/Bedside Swallow Evaluation ?Patient Details  ?Name: Kevin Page ?MRN: [id number] ?Date of Birth: 01-Aug-1930 ? ?Today's Date: 08/03/2021 ?Time: SLP Start Time (ACUTE ONLY): [id number] SLP Stop Time (ACUTE ONLY): 4098 ?SLP Time Calculation (min) (ACUTE ONLY): 23 min ? ?Past Medical History:  ?Past Medical History:  ?Diagnosis Date  ? Allergy   ? dust and mites  ? BPH (benign prostatic hyperplasia)   ? Essential hypertension   ? Hyperlipidemia   ? Macular degeneration   ? Osteoarthritis of both knees   ? Sinus bradycardia   ? SVT (supraventricular tachycardia) ([place])   ? a. Event monitor 2014: few episodes of SVT, longest 12 beats with rate 128.  ? Syncope   ? ?Past Surgical History:  ?Past Surgical History:  ?Procedure Laterality Date  ? CATARACT EXTRACTION W/ INTRAOCULAR LENS  IMPLANT, BILATERAL Bilateral 05/18/2009  ? dupytron contractions  1998 , 2006  ? both hands  ? gi bleeding    ? LUMBAR FUSION  05/18/1992  ? ?HPI:  ?Pt is an 88 y.o. male who presented to the ED for syncope. MRI brain negative. PMH: MCI,  BPH, HTN, hx of TIAs, rectal bleed in 2022 with hospitalization and conservative management, SVT. Swallow evaluation September 2022; cognitively based dysphagia noted; dysphagia 2 initially recommended, but pt then advanced to regular and thin liquids Esophagram 04/02/21: Esophageal dysmotility.  ?  ?Assessment / Plan / Recommendation  ?Clinical Impression ? Pt was seen for bedside swallow evaluation with his wife present. Pt's wife reported that the pt has a mild cognitive impairment and that his cognition is currently at baseline. She therefore assisted in provision of history. She reported that the pt consumes softer foods with meats chopped. She stated that the pt often coughs with p.o. intake, but they both attributed this to post-nasal drainage. Pt endorsed the sensation of "food not going down" and identified the approximate level of the valleculae. Oral mechanism exam was [facility] and dentition was reduced.  He presented with symptoms of oropharyngeal dysphagia characterized by prolonged mastication, multiple swallows, and signs of aspiration with thin and nectar thick liquids. Pt's symptoms were reduced with individual swallows of thin liquids. SLP suspects pharyngeal residue and aspiration. A modified barium swallow study is recommended to further assess swallow physiology, but this cannot be scheduled with radiology today and therefore will be deferred. A dysphagia 3 diet with thin liquids is recommended with observance of swallowing precautions. ?SLP Visit Diagnosis: Dysphagia, unspecified (R13.10) ?   ?Aspiration Risk ? Mild aspiration risk;Moderate aspiration risk  ?  ?Diet Recommendation Dysphagia 3 (Mech soft);Thin liquid  ? ?Liquid Administration via: Cup;Straw ?Medication Administration: Whole meds with puree ?Supervision: Full supervision/cueing for compensatory strategies ?Compensations: Slow rate;Small sips/bites (Avoid consecutive swallows) ?Postural Changes: Seated upright at 90 degrees  ?  ?Other  Recommendations Oral Care Recommendations: Oral care BID   ? ?Recommendations for follow up therapy are one component of a multi-disciplinary discharge planning process, led by the attending physician.  Recommendations may be updated based on patient status, additional functional criteria and insurance authorization. ? ?Follow up Recommendations Skilled nursing-short term rehab (<3 hours/day)  ? ? ?  ?Assistance Recommended at Discharge    ?Functional Status Assessment Patient has not had a recent decline in their functional status  ?Frequency and Duration min 2x/week  ?2 weeks ?  ?   ? ?Prognosis Prognosis for Safe Diet Advancement: Fair ?Barriers to Reach Goals: Severity of deficits;Time post onset  ? ?  ? ?Swallow Study   ?General  Date of Onset: 08/02/21 ?HPI: Pt is an 88 y.o. male who presented to the ED for syncope. MRI brain negative. PMH: MCI,  BPH, HTN, hx of TIAs, rectal bleed in 2022 with  hospitalization and conservative management, SVT. Swallow evaluation September 2022; cognitively based dysphagia noted; dysphagia 2 initially recommended, but pt then advanced to regular and thin liquids Esophagram 04/02/21: Esophageal dysmotility. ?Type of Study: Bedside Swallow Evaluation ?Previous Swallow Assessment: See HPI ?Diet Prior to this Study: Regular;Thin liquids ?Temperature Spikes Noted: No ?Respiratory Status: Room air ?History of Recent Intubation: No ?Behavior/Cognition: Alert;Cooperative;Pleasant mood;Requires cueing ?Oral Cavity Assessment: Within Functional Limits ?Oral Care Completed by SLP: No ?Oral Cavity - Dentition: Adequate natural dentition;Missing dentition ?Vision: Functional for self-feeding ?Self-Feeding Abilities: Needs assist ?Patient Positioning: Upright in bed;Postural control adequate for testing ?Baseline Vocal Quality: Normal ?Volitional Cough: Strong ?Volitional Swallow: Able to elicit  ?  ?Oral/Motor/Sensory Function Overall Oral Motor/Sensory Function: Within functional limits   ?Ice Chips Ice chips: Impaired ?Presentation: Spoon ?Pharyngeal Phase Impairments: Multiple swallows   ?Thin Liquid Thin Liquid: Impaired ?Presentation: Cup;Straw ?Pharyngeal  Phase Impairments: Multiple swallows;Cough - Immediate;Cough - Delayed  ?  ?Nectar Thick Nectar Thick Liquid: Impaired ?Presentation: Cup ?Pharyngeal Phase Impairments: Multiple swallows;Cough - Immediate;Cough - Delayed   ?Honey Thick Honey Thick Liquid: Not tested   ?Puree Puree: Impaired ?Presentation: Spoon ?Pharyngeal Phase Impairments: Multiple swallows   ?Solid ? ? ?  Solid: Impaired ?Presentation: Self Fed ?Oral Phase Impairments: Impaired mastication ?Oral Phase Functional Implications: Impaired mastication ?Pharyngeal Phase Impairments: Multiple swallows  ? ?  ?[name] I. [name], [place], CCC-SLP ?Acute Rehabilitation Services ?Office number [phone] ?Pager [phone] ? ?Horton Marshall ?08/03/2021,10:14  AM ? ? ? ? ? ?

## 2021-08-04 ENCOUNTER — Inpatient Hospital Stay (HOSPITAL_COMMUNITY): Payer: Medicare HMO

## 2021-08-04 DIAGNOSIS — R55 Syncope and collapse: Secondary | ICD-10-CM | POA: Diagnosis not present

## 2021-08-04 LAB — BASIC METABOLIC PANEL
Anion gap: 6 (ref 5–15)
BUN: 15 mg/dL (ref 8–23)
CO2: 28 mmol/L (ref 22–32)
Calcium: 8.5 mg/dL — ABNORMAL LOW (ref 8.9–10.3)
Chloride: 104 mmol/L (ref 98–111)
Creatinine, Ser: 0.61 mg/dL (ref 0.61–1.24)
GFR, Estimated: >60 mL/min (ref >60)
Glucose, Bld: 97 mg/dL (ref 70–99)
Potassium: 3.8 mmol/L (ref 3.5–5.1)
Sodium: 138 mmol/L (ref 135–145)

## 2021-08-04 LAB — MAGNESIUM: Magnesium: 1.9 mg/dL (ref 1.7–2.4)

## 2021-08-04 LAB — HEMOGLOBIN AND HEMATOCRIT, BLOOD
HCT: 28.2 % — ABNORMAL LOW (ref 39.0–52.0)
Hemoglobin: 9.2 g/dL — ABNORMAL LOW (ref 13.0–17.0)

## 2021-08-04 NOTE — Progress Notes (Signed)
?PROGRESS NOTE ? ? ? ?Kevin Page  MKL:[id number] DOB: [birth year] DOA: 07/31/2021 ?PCP: Ginger Organ., MD  ? ? ?Brief Narrative:  ?Kevin Page is a 88 year old male with past medical history significant for mild cognitive impairment, BPH, essential hypertension, history of TIA, history of rectal bleed 2022 presumed to be diverticular, history of SVT who presented to [facility] ED on 3/16 via EMS with reported syncopal episode.  Spouse reports she went to get him up around 8:30 AM and told him to go to the bathroom and he was apparently nonresponsive.  He would not smile, raise his arms and just stared at her and subsequently spouse called EMS.  On the way out of the house, patient had a syncopal episode where he got stiff and rigid, no seizure-like activity reported.  Patient denied any prodromal symptoms such as dizziness, no chest pain, no palpitations.  On EMS arrival, BP was noted 98/58 and was given a 500 cc bolus.  Wife states he presented similarly in the past with TIAs.  On arrival to the ED, patient's symptoms were completely resolved.  He further denied pain, no recent illness/fever, no chills, no headache, no dizziness, no chest pain, no palpitations, no shortness of breath, no cough, no abdominal pain, no nausea/vomiting/diarrhea, no dysuria/urinary symptoms and no leg swelling. ? ?In the ED, temperature 98.3 ?F, HR 69, RR 16, BP 113/72, SPO2 95% on room air.  WBC 13.4, hemoglobin 9.1, platelets 237.  Sodium 137, potassium 3.8, chloride 103, CO2 26, glucose 105, BUN 41, creatinine 0.81.  AST 22, ALT 12, total bilirubin 0.6.  Lactic acid 1.7.  COVID-19 PCR negative.  Influenza A/B PCR negative.  Urinalysis unrevealing.  CT head without contrast with chronic changes with no acute intracranial process.  Chest x-ray with no evidence of acute cardiopulmonary disease process.  Initial FOBT negative, repeat positive.  Positive orthostatic vital signs.  EDP started patient on IV fluids.  TRH consulted for further  evaluation and management of syncope, GI bleed.  ? ? ? ?Assessment and Plan: ?* Syncope and collapse ?Patient presenting to the ED following witnessed syncopal episode x2 with unresponsiveness.  No reported seizure-like activity.  Was notably orthostatic with low BP on EMS arrival.  Urinalysis, chest x-ray unrevealing.  CT head without contrast with no acute intracranial findings.  TTE with LVEF 65-70%, no regional wall motion abnormalities, mild AR.  MR brain without contrast with no evidence of acute intracranial abnormality, notable for cerebral atrophy.  Etiology likely secondary to orthostatic hypotension from dehydration in the setting of cognitive impairment versus autonomic dysfunction.  Also complicated by anemia. ?--Continue to monitor on telemetry ?--TED hose, supportive care ?--PT/OT evaluation ?--Fall precautions ? ?Anemia, GI Bleed ?Hemoglobin on arrival 9.1 (baseline 11-12).  History of GI bleed in the past treated conservatively, presumed diverticular.  Denies NSAID abuse, only on aspirin outpatient and no other anticoagulant/antiplatelet.  Initial FOBT was negative, repeat positive.  Patient with elevated BUN on arrival.  Anemia panel with iron 68, TIBC 232 (low), ferritin 33, folate 14.4, B12 214 (low normal).  Seen by [facility] gastroenterology, recommend conservative measures at this time with plan outpatient follow-up in 3-4 weeks.  Suspected etiology possible gastric ulcer given melena. ?--Hgb 9.1>9.0>8.3>8.0>8.3>7.2>9.2>9.1>9.2; stable ?--s/p 1u pRBC 3/17 ?--holding aspirin ?--Protonix '40mg'$  PO BID x 21 days; followed by '40mg'$  PO daily ?--Transfuse for hemoglobin< 7.0 or active bleeding ? ?Elevated troponin ?High sensitive troponin 30> 37> 47.  Denies chest pain.  No concerning dynamic changes on EKG. ?--  Continue monitor on telemetry ? ?B12 deficiency ?Vitamin B12 214, low normal. ?--Started on Vitamin B12 1000 mcg p.o. daily ? ?Dementia ([place]) ?Spouse reports mild cognitive impairment.  On Aricept  outpatient, currently holding as small percentage associated with syncope. ?--Delirium precautions ?--Get up during the day ?--Encourage a familiar face to remain present throughout the day ?--Keep blinds open and lights on during daylight hours ?--Minimize the use of opioids/benzodiazepines ? ?BPH (benign prostatic hyperplasia) ?--Continue avodart  ? ?Physical deconditioning ?--Continue PT/OT efforts while inpatient ?--TOC consulted for SNF placement, spouse prefers [facility] ? ?Dysphagia ?Seen by speech therapy, underwent modified barium swallow on 3/20 with finding of aspiration/penetration due to anatomical difference and cognitive linguistic impairments.  Discussed with patient and spouse that he set risk for aspiration events or developing pneumonia in the future.  S noted that patient would not want his liquids thickened at this time.  Continue aspiration precautions. ?Dysphagia 3 (Mech soft);Thin liquid  ? Liquid Administration via: Cup;Straw ?Medication Administration: Whole meds with puree ?Supervision: Full supervision/cueing for compensatory strategies ?Compensations: Slow rate;Small sips/bites (Avoid consecutive swallows) ?Postural Changes: Seated upright at 90 degrees ? ? ? ?DVT prophylaxis: SCDs Start: 07/31/21 1350 ?Place TED hose Start: 07/31/21 1350 ? ?  Code Status: DNR ?Family Communication: Updated spouse present at bedside yesterday afternoon ? ?Disposition Plan:  ?Level of care: Telemetry Medical ?Status is: Inpatient ?Remains inpatient appropriate because: Pending SNF placement ?  ? ?Consultants:  ?[facility] - signed off 3/17 ? ?Procedures:  ?TTE ? ?Antimicrobials:  ?None ? ? ?Subjective: ?Patient seen examined bedside, resting comfortably.  Sitting in bedside chair eating breakfast.  Underwent modified barium swallow this morning with findings of aspiration/penetration.  Discussed with wife by myself and SLP regarding aspiration risk, declines thickened liquids at this time.   Spouse asking about SNF placement, states that according to [facility] facility will be able to accept patient on Thursday.  Hemoglobin stable, 9.2 this morning.  Patient denies headache, no dizziness, no chest pain, no palpitations, no shortness of breath, no fever/chills/night sweats, no nausea/vomiting/diarrhea, no weakness, no fatigue, no paresthesias, no cough/congestion.  No acute events overnight per nursing staff. ? ?Objective: ?Vitals:  ? 08/04/21 0000 08/04/21 0345 08/04/21 0738 08/04/21 1114  ?BP: (!) 155/69 (!) 143/67 (!) 181/81 113/77  ?Pulse: 60 63 60 61  ?Resp: '18 16 16 18  '$ ?Temp: 98.2 ?F (36.8 ?C) 98.3 ?F (36.8 ?C) 98 ?F (36.7 ?C) 98.3 ?F (36.8 ?C)  ?TempSrc: Oral Oral  Oral  ?SpO2: 97% 95% 95% 98%  ?Weight:      ?Height:      ? ? ?Intake/Output Summary (Last 24 hours) at 08/04/2021 1313 ?Last data filed at 08/04/2021 0004 ?Gross per 24 hour  ?Intake --  ?Output 400 ml  ?Net -400 ml  ? ? ?Filed Weights  ? 07/31/21 1001 08/02/21 0500  ?Weight: 65.8 kg 66.1 kg  ? ? ?Examination: ? ?Physical Exam: ?GEN: NAD, alert and oriented to person Airline pilot), Place [facility]) but not time, elderly in appearance ?HEENT: NCAT, PERRL, EOMI, sclera clear, MMM ?PULM: CTAB w/o wheezes/crackles, normal respiratory effort, on room air ?CV: RRR w/o M/G/R ?GI: abd soft, NTND, NABS, no R/G/M ?MSK: no peripheral edema, muscle strength globally intact 5/5 bilateral upper/lower extremities ?NEURO: CN II-XII intact, no focal deficits, sensation to light touch intact ?PSYCH: normal mood/affect ?Integumentary: dry/intact, no rashes or wounds ? ? ? ?Data Reviewed: I have personally reviewed following labs and imaging studies ? ?CBC: ?Recent Labs  ?  Lab 07/31/21 ?1005 07/31/21 ?1349 07/31/21 ?2030 08/01/21 ?0435 08/01/21 ?0732 08/01/21 ?1352 08/02/21 ?5681 08/03/21 ?2751 08/04/21 ?0325  ?WBC 13.4* 15.6* 10.6* 8.7 9.5  --  9.2  --   --   ?NEUTROABS 11.1*  --   --   --   --   --   --   --   --   ?HGB 9.1* 9.0* 8.3* 8.0*  8.3* 7.2* 9.2* 9.1* 9.2*  ?HCT 27.6* 28.0* 25.4* 23.9* 24.8* 22.2* 27.7* 26.3* 28.2*  ?MCV 95.2 96.2 94.4 93.4 93.6  --  91.7  --   --   ?PLT 237 245 236 216 211  --  212  --   --   ? ?Basic Metaboli

## 2021-08-04 NOTE — [place] FL2 (Signed)
?[place] MEDICAID FL2 LEVEL OF CARE SCREENING TOOL  ?  ? ?IDENTIFICATION  ?Patient Name: ?Kevin Page Birthdate: [birth year] Sex: male Admission Date (Current Location): ?07/31/2021  ?[place] and [place] Number: ? [place] ?  Facility and Address:  ?[facility], [place] [address] [place], [place] [address] ?     Provider Number: ?[id number]  ?Attending Physician Name and Address:  ?[place] Eric J, DO ? Relative Name and Phone Number:  ?  ?   ?Current Level of Care: ?Hospital Recommended Level of Care: ?[place] Prior Approval Number: ?  ? ?Date Approved/Denied: ?  PASRR Number: ?[id number] A ? ?Discharge Plan: ?SNF ?  ? ?Current Diagnoses: ?Patient Active Problem List  ? Diagnosis Date Noted  ? Physical deconditioning 08/02/2021  ? GI bleed 08/01/2021  ? Anemia, GI Bleed 07/31/2021  ? B12 deficiency 07/31/2021  ? Elevated troponin 07/31/2021  ? Dysphagia 03/30/2021  ? Disequilibrium 03/30/2021  ? Overflow incontinence of urine 03/30/2021  ? Diverticular hemorrhage   ? Rectal bleeding 01/23/2021  ? Painless rectal bleeding 01/22/2021  ? Dementia ([place]) 01/22/2021  ? Abnormal gait 10/12/2019  ? Palpitations 06/13/2019  ? SVT (supraventricular tachycardia) ([place])   ? Sinus bradycardia   ? Osteoarthritis of both knees   ? Macular degeneration   ? Allergy   ? Advanced dry age-related macular degeneration of both eyes with subfoveal involvement 03/03/2016  ? Hypertensive retinopathy of both eyes 03/03/2016  ? Pseudophakia of both eyes 03/03/2016  ? PVD (posterior vitreous detachment), both eyes 03/03/2016  ? Orthostasis 01/19/2016  ? Dilated aortic root ([place]) 01/19/2016  ? PSVT (paroxysmal supraventricular tachycardia) ([place]) 01/19/2016  ? Mild aortic insufficiency 01/19/2016  ? Sinus bradycardia seen on cardiac monitor 01/18/2016  ? Postnasal drip 11/05/2014  ? Age-related osteoporosis without current pathological fracture 11/13/2013  ? Incontinence of feces 11/01/2013  ? Tobacco user  11/01/2013  ? Bradycardia 01/30/2013  ? Syncope and collapse 01/12/2013  ? BPH (benign prostatic hyperplasia) 01/12/2013  ? Personal history of transient ischemic attack (TIA), and cerebral infarction without residual deficits 01/13/2012  ? Osteoarthritis 05/21/2011  ? ? ?Orientation RESPIRATION BLADDER Height & Weight   ?  ?Self ? Normal Incontinent Weight: 145 lb 11.6 oz (66.1 kg) ?Height:  '5\' 10"'$  (177.8 cm)  ?BEHAVIORAL SYMPTOMS/MOOD NEUROLOGICAL BOWEL NUTRITION STATUS  ?    Incontinent Diet (see DC summary)  ?AMBULATORY STATUS COMMUNICATION OF NEEDS Skin   ?Extensive Assist Verbally Normal ?  ?  ?  ?    ?     ?     ? ? ?Personal Care Assistance Level of Assistance  ?Bathing, Feeding, Dressing Bathing Assistance: Maximum assistance ?Feeding assistance: Limited assistance ?Dressing Assistance: Maximum assistance ?   ? ?Functional Limitations Info  ?    ?  ?   ? ? ?SPECIAL CARE FACTORS FREQUENCY  ?PT (By licensed PT), OT (By licensed OT)   ?  ?PT Frequency: 5x/wk ?OT Frequency: 5x/wk ?  ?  ?  ?   ? ? ?Contractures Contractures Info: Not present  ? ? ?Additional Factors Info  ?Code Status, Allergies Code Status Info: DNR ?Allergies Info: NKA ?  ?  ?  ?   ? ?Current Medications (08/04/2021):  This is the current hospital active medication list ?Current Facility-Administered Medications  ?Medication Dose Route Frequency Provider Last Rate Last Admin  ? acetaminophen (TYLENOL) tablet 650 mg  650 mg Oral Q6H PRN Orma Flaming, MD      ? Or  ?  acetaminophen (TYLENOL) suppository 650 mg  650 mg Rectal Q6H PRN Orma Flaming, MD      ? dutasteride (AVODART) capsule 0.5 mg  0.5 mg Oral QHS Orma Flaming, MD   0.5 mg at 08/03/21 2157  ? Gerhardt's butt cream   Topical BID [place] Eric J, DO   Given at 08/04/21 1025  ? guaiFENesin (MUCINEX) 12 hr tablet 600 mg  600 mg Oral Daily Orma Flaming, MD   600 mg at 08/04/21 8527  ? mirabegron ER (MYRBETRIQ) tablet 25 mg  25 mg Oral QHS [place] Eric J, DO   25 mg at 08/03/21 2157   ? pantoprazole (PROTONIX) EC tablet 40 mg  40 mg Oral BID [place] Eric J, DO   40 mg at 08/04/21 7824  ? Followed by  ? [START ON 08/25/2021] pantoprazole (PROTONIX) EC tablet 40 mg  40 mg Oral Daily [place] Eric J, DO      ? potassium chloride (KLOR-CON) packet 40 mEq  40 mEq Oral Once Shela Leff, MD      ? sodium chloride flush (NS) 0.9 % injection 3 mL  3 mL Intravenous Q12H Orma Flaming, MD   3 mL at 08/04/21 0804  ? vitamin B-12 (CYANOCOBALAMIN) tablet 1,000 mcg  1,000 mcg Oral Daily Orma Flaming, MD   1,000 mcg at 08/04/21 2353  ? ? ? ?Discharge Medications: ?Please see discharge summary for a list of discharge medications. ? ?Relevant Imaging Results: ? ?Relevant Lab Results: ? ? ?Additional Information ?SS#: [id number] ? ?Geralynn Ochs, LCSW ? ? ? ? ?

## 2021-08-04 NOTE — Progress Notes (Signed)
Speech Language Pathology Treatment: Dysphagia  ?Patient Details ?Name: Kevin Page ?MRN: [id number] ?DOB: Aug 28, 1930 ?Today's Date: 08/04/2021 ?Time: 6484-7207 ?SLP Time Calculation (min) (ACUTE ONLY): 14 min ? ?Assessment / Plan / Recommendation ?Clinical Impression ? SLP discussed MBS results with patient's spouse, Kevin Page. SLP showed pt's spouse MBS images with visible bony protrusion in cervical spine and observed events of penetration/aspiration with thin, nectar and honey thick liquids. SLP discussed with spouse that regardless of texture modification, pt remains risk for aspiration/penetration d/t anatomical difference and cognitive-linguistic impairments. Spouse verbalized understanding that pt is at advanced risk for aspiration events or developing aspiration PNA in the future. Spouse expressed that the pt would not want liquids thickened at this time. Despite risks, wife endorsed wanting a regular diet with thin liquids at this time. SLP coached spouse on compensatory strategies. Strategies educated on included upright positioning at mealtimes, oral care before & after meals, and intermittent cues to cough and clear throat across mealtimes. SLP recommends initiating Regular Diet with thin liquids for least restrictive diet at this time.  ?  ?HPI HPI: Pt is an 88 y.o. male who presented to the ED for syncope. MRI brain negative. PMH: MCI,  BPH, HTN, hx of TIAs, rectal bleed in 2022 with hospitalization and conservative management, SVT. Swallow evaluation September 2022; cognitively based dysphagia noted; dysphagia 2 initially recommended, but pt then advanced to regular and thin liquids Esophagram 04/02/21: Esophageal dysmotility. ?  ?   ?SLP Plan ? All goals met ? ?  ?  ?Recommendations for follow up therapy are one component of a multi-disciplinary discharge planning process, led by the attending physician.  Recommendations may be updated based on patient status, additional functional criteria and insurance  authorization. ?  ? ?Recommendations  ?Diet recommendations: Regular;Thin liquid ?Liquids provided via: Cup;Straw ?Medication Administration: Crushed with puree ?Supervision: Patient able to self feed ?Compensations: Slow rate;Small sips/bites;Clear throat intermittently ?Postural Changes and/or Swallow Maneuvers: Seated upright 90 degrees;Upright 30-60 min after meal  ?   ?    ?   ? ? ? ? Oral Care Recommendations: Oral care QID ?Follow Up Recommendations: Skilled nursing-short term rehab (<3 hours/day) ?Assistance recommended at discharge: Intermittent Supervision/Assistance ?SLP Visit Diagnosis: Dysphagia, oropharyngeal phase (R13.12) ?Plan: All goals met ? ? ? ? ?  ?  ? ? ?Kevin Page ? ?08/04/2021, 12:36 PM ?

## 2021-08-04 NOTE — Progress Notes (Signed)
Occupational Therapy Treatment Patient Details Name: Kevin Page MRN: [id number] DOB: 09-Apr-1931 Today's Date: 08/04/2021   History of present illness Pt is an 88 y/o male admitted 3/16 with syncope/unresponsiveness.  Work up found anemia.  MRI pending.  PMHx MCI, BPH, HTN, TIA's, rectal bleed.   OT comments  Pt was seen for acute OT ADL retraining session today with focus on bed mobility, functional transfers in preparation for increased participitation with ADL's. Pt declined toilet transfer to 3:1 and was noted to be incontinent of bowels. Pt is overall Max assist to clean up with frequent redirection to task at hand as he repeatedly stated "Why am I standing? Can I sit yet?" etc.. Pt was then assisted to recliner chair using RW w/ Mod assist as he appeared to fatigue during transfer and was attempting to sit before chair was safely behind him. Pt should benefit from STR at SNF. Pt's wife is very supportive and requesting SNF at [facility] if possible.   Recommendations for follow up therapy are one component of a multi-disciplinary discharge planning process, led by the attending physician.  Recommendations may be updated based on patient status, additional functional criteria and insurance authorization.    Follow Up Recommendations  Skilled nursing-short term rehab (<3 hours/day)    Assistance Recommended at Discharge Frequent or constant Supervision/Assistance  Patient can return home with the following  A little help with walking and/or transfers;A lot of help with bathing/dressing/bathroom;Assistance with cooking/housework;Assist for transportation;Direct supervision/assist for medications management;Help with stairs or ramp for entrance   Equipment Recommendations  None recommended by OT    Recommendations for Other Services      Precautions / Restrictions Precautions Precautions: Fall       Mobility Bed Mobility Overal bed mobility: Needs Assistance Bed Mobility: Supine to  Sit     Supine to sit: Min guard, HOB elevated       Patient Response: Flat affect, Cooperative  Transfers Overall transfer level: Needs assistance Equipment used: Rolling walker (2 wheels) Transfers: Sit to/from Stand Sit to Stand: From elevated surface, Min assist Stand pivot transfers: Mod assist       General transfer comment: SPT from EOB to chair using RW with Mod A and verbal/tactile cues for safety, sequencing.     Balance Overall balance assessment: Needs assistance Sitting-balance support: No upper extremity supported, Single extremity supported Sitting balance-Leahy Scale: Fair     Standing balance support: During functional activity, Bilateral upper extremity supported Standing balance-Leahy Scale: Poor Standing balance comment: Pt reliant on RW and bilateral UE support in standing       ADL either performed or assessed with clinical judgement   ADL Overall ADL's : Needs assistance/impaired     Lower Body Bathing: Maximal assistance;Sit to/from stand Lower Body Bathing Details (indicate cue type and reason): Pt min-Mod A for sit to stand but Max assist to bathe/clean up after having bowel movement in bed, pt unaware and required frequent verbal reminders that he had had a BM Upper Body Dressing : Minimal assistance;Sitting   Lower Body Dressing: Maximal assistance;Sitting/lateral leans     Toilet Transfer Details (indicate cue type and reason): Pt declined toilet transfer stating that he did not need to go to the bathroom, agreeable to getting up into his chair. Upon sitting up at EOB, pt had BM and was unaware. Toileting- Clothing Manipulation and Hygiene: Maximal assistance;Sitting/lateral lean;Sit to/from stand Toileting - Clothing Manipulation Details (indicate cue type and reason): Bowel incontinence noted, pt unaware, max assist  to clean and frequent vc's/reminders as pt stood to be cleaned   General ADL Comments: Pt was seen for acute OT ADL  retraining session today with focus on bed mobility, functional transfers in preparation for increased participitation with ADL's. Pt declined toilet transfer to 3:1 and was then noted to be incontinent of bowels. Pt was overall Max assist to clean up with frequent redirection to task at hand as he repeatedly stated "Why am I standing? Can I sit yet?" etc.. Pt was then assisted to recliner chair using RW w/ Mod assist as he appeared to fatigue during transfer and was attempting to sit before chair was safely behind him. Pt should benefit from STR at SNF.    Extremity/Trunk Assessment Upper Extremity Assessment Upper Extremity Assessment: Generalized weakness;RUE deficits/detail;LUE deficits/detail RUE Deficits / Details: Shoulder flexion 0-110 degrees, decreased FM coordination secondary to hx of Dupuytren's contractures.  Overall strength 3+/5 throughout. RUE Coordination: decreased fine motor LUE Deficits / Details: Shoulder flexion 0-100 degrees with crepitus noted at the glenohumeral joint.  He exhibits indext finger DIP contracture at 90 degrees flexion secondary to hx of Dupuytrens contracture. LUE Coordination: decreased fine motor   Lower Extremity Assessment Lower Extremity Assessment: Defer to PT evaluation   Cervical / Trunk Assessment Cervical / Trunk Assessment: Kyphotic    Vision Baseline Vision/History: 1 Wears glasses;6 Macular Degeneration Ability to See in Adequate Light: 2 Moderately impaired Patient Visual Report: No change from baseline            Cognition Arousal/Alertness: Awake/alert Behavior During Therapy: Flat affect Overall Cognitive Status: History of cognitive impairments - at baseline                  General Comments  Incontinent of bowels    Pertinent Vitals/ Pain       Pain Assessment Pain Assessment: No/denies pain  Home Living Family/patient expects to be discharged to:: Skilled nursing facility Living Arrangements: Spouse/significant  other Available Help at Discharge: Family;Available 24 hours/day Type of Home: House (townhome) Home Access: Stairs to enter [employer] of Steps: 5 Entrance Stairs-Rails: Right;Left Home Layout: Two level;Able to live on main level with bedroom/bathroom     Bathroom Shower/Tub: Producer, television/film/video: Handicapped height Bathroom Accessibility: Yes   Home Equipment: Wheelchair - Government social research officer - single point;Grab bars - tub/shower;Rollator (4 wheels)          Prior Functioning/Environment  Lives at home. Pt wife assists PRN with all ADL's, homemaking, meal prep etc. Per pt wife, pt was Mod I ambulating to/from bathroom, in/out/of bed etc.        Frequency  Min 2X/week        Progress Toward Goals  OT Goals(current goals can now be found in the care plan section)  Progress towards OT goals: Progressing toward goals  Acute Rehab OT Goals Patient Stated Goal: None stated, per pt wife, SNF Rehab OT Goal Formulation: With patient/family Time For Goal Achievement: 08/15/21 Potential to Achieve Goals: Good  Plan Discharge plan remains appropriate       AM-PAC OT "6 Clicks" Daily Activity     Outcome Measure   Help from another person eating meals?: None Help from another person taking care of personal grooming?: A Little Help from another person toileting, which includes using toliet, bedpan, or urinal?: A Lot Help from another person bathing (including washing, rinsing, drying)?: A Lot Help from another person to put on and taking off regular upper body clothing?:  A Little Help from another person to put on and taking off regular lower body clothing?: A Lot 6 Click Score: 16    End of Session Equipment Utilized During Treatment: Gait belt;Rolling walker (2 wheels)  OT Visit Diagnosis: Muscle weakness (generalized) (M62.81);Unsteadiness on feet (R26.81)   Activity Tolerance Patient tolerated treatment well;Patient limited by fatigue    Patient Left in chair;with call bell/phone within reach;with chair alarm set;with family/visitor present   Nurse Communication          Time: 4403-4742 OT Time Calculation (min): 29 min  Charges: OT General Charges $OT Visit: 1 Visit OT Treatments $Self Care/Home Management : 23-37 mins   Alm Bustard, OTR/L 08/04/2021, 12:49 PM

## 2021-08-04 NOTE — Progress Notes (Signed)
Modified Barium Swallow Progress Note ? ?Patient Details  ?Name: Kevin Page ?MRN: [id number] ?Date of Birth: [birth year] ? ?Today's Date: 08/04/2021 ? ?Modified Barium Swallow completed.  Full report located under Chart Review in the Imaging Section. ? ?Brief recommendations include the following: ? ?Clinical Impression ? Pt demonstrates a moderate to severe primary oropharyngeal dysphagia with high risk of aspriation regardless of texture modification. Pt has significant cognitive-linguistic deficits at baseline leading to further concerns for aspiration and penetration. Observed large bony protrusion on cervical spine (C3-4 levels). Oropharyngeal dysphagia characterized by prolonged mastication, piecemeal swallow sequence, incomplete epiglottic closure, and penetration/aspiration. Pt's osteophyte protrusion causes reduction in base of tongue retraction to posterior pharyngeal wall, impaired pharyngeal peristalsis and incomplete epiglottic deflection. Pt silently aspirated individual cup sips of thin liquids. NTL and HTL penetrated airway, however pt sensed penetrates. Pt used subsequent swallows independently and spontaneously to clear penetrates and residuals. Prognosis for pt to meet nutritional needs by mouth without aspiration is guarded. SLP discussed MBS results with MD. MD and SLP determined there is no acute reason to downgrade patient's diet. SLP to discuss MBS results, oral care regimen, and compensatory strategies with family. SLP will defer diet recommendation to MD/family. No acute reason to modify pt's diet at this time given baseline cognitive-linguistic deficits and poor prognosis for improved swallow function. If modification needs arise in future, HTL reduced aspiration.  ?  ?Swallow Evaluation Recommendations ? ? Recommended Consults: Consider GI evaluation ? ? SLP Diet Recommendations: Regular solids;Thin liquid ? ? Liquid Administration via: Cup;Straw ? ? Medication Administration: Crushed with  puree ? ? Supervision: Patient able to self feed ? ? Compensations: Slow rate;Small sips/bites;Clear throat intermittently ? ? Postural Changes: Remain semi-upright after after feeds/meals (Comment);Seated upright at 90 degrees ? ? Oral Care Recommendations: Oral care before and after PO ? ?   ? ? ? ?[employer], Student-SLP  ?08/04/2021,10:47 AM ?

## 2021-08-04 NOTE — Care Management Important Message (Signed)
Important Message ? ?Patient Details  ?Name: Kevin Page ?MRN: [id number] ?Date of Birth: 1930-07-03 ? ? ?Medicare Important Message Given:  Yes ? ? ? ? ?[name] ?08/04/2021, 3:28 PM ?

## 2021-08-05 DIAGNOSIS — R55 Syncope and collapse: Secondary | ICD-10-CM | POA: Diagnosis not present

## 2021-08-05 NOTE — Progress Notes (Signed)
?PROGRESS NOTE ? ? ? ?Quade Ramirez  TFT:[id number] DOB: August 30, 1930 DOA: 07/31/2021 ?PCP: Ginger Organ., MD  ? ? ?Brief Narrative:  ?Shizuo Biskup is a 88 year old male with past medical history significant for mild cognitive impairment, BPH, essential hypertension, history of TIA, history of rectal bleed 2022 presumed to be diverticular, history of SVT who presented to [facility] ED on 3/16 via EMS with reported syncopal episode.  Spouse reports she went to get him up around 8:30 AM and told him to go to the bathroom and he was apparently nonresponsive.  He would not smile, raise his arms and just stared at her and subsequently spouse called EMS.  On the way out of the house, patient had a syncopal episode where he got stiff and rigid, no seizure-like activity reported.  Patient denied any prodromal symptoms such as dizziness, no chest pain, no palpitations.  On EMS arrival, BP was noted 98/58 and was given a 500 cc bolus.  Wife states he presented similarly in the past with TIAs.  On arrival to the ED, patient's symptoms were completely resolved.  He further denied pain, no recent illness/fever, no chills, no headache, no dizziness, no chest pain, no palpitations, no shortness of breath, no cough, no abdominal pain, no nausea/vomiting/diarrhea, no dysuria/urinary symptoms and no leg swelling. ? ?In the ED, temperature 98.3 ?F, HR 69, RR 16, BP 113/72, SPO2 95% on room air.  WBC 13.4, hemoglobin 9.1, platelets 237.  Sodium 137, potassium 3.8, chloride 103, CO2 26, glucose 105, BUN 41, creatinine 0.81.  AST 22, ALT 12, total bilirubin 0.6.  Lactic acid 1.7.  COVID-19 PCR negative.  Influenza A/B PCR negative.  Urinalysis unrevealing.  CT head without contrast with chronic changes with no acute intracranial process.  Chest x-ray with no evidence of acute cardiopulmonary disease process.  Initial FOBT negative, repeat positive.  Positive orthostatic vital signs.  EDP started patient on IV fluids.  TRH consulted for further  evaluation and management of syncope, GI bleed.  ? ? ? ?Assessment and Plan: ?* Syncope and collapse ?Patient presenting to the ED following witnessed syncopal episode x2 with unresponsiveness.  No reported seizure-like activity.  Was notably orthostatic with low BP on EMS arrival.  Urinalysis, chest x-ray unrevealing.  CT head without contrast with no acute intracranial findings.  TTE with LVEF 65-70%, no regional wall motion abnormalities, mild AR.  MR brain without contrast with no evidence of acute intracranial abnormality, notable for cerebral atrophy.  Etiology likely secondary to orthostatic hypotension from dehydration in the setting of cognitive impairment versus autonomic dysfunction.  Also complicated by anemia. ?--Continue to monitor on telemetry ?--TED hose, supportive care ?--PT/OT evaluation ?--Fall precautions ? ?Anemia, GI Bleed ?Hemoglobin on arrival 9.1 (baseline 11-12).  History of GI bleed in the past treated conservatively, presumed diverticular.  Denies NSAID abuse, only on aspirin outpatient and no other anticoagulant/antiplatelet.  Initial FOBT was negative, repeat positive.  Patient with elevated BUN on arrival.  Anemia panel with iron 68, TIBC 232 (low), ferritin 33, folate 14.4, B12 214 (low normal).  Seen by [facility] gastroenterology, recommend conservative measures at this time with plan outpatient follow-up in 3-4 weeks.  Suspected etiology possible gastric ulcer given melena. ?--Hgb 9.1>9.0>8.3>8.0>8.3>7.2>9.2>9.1>9.2; stable ?--s/p 1u pRBC 3/17 ?--holding aspirin ?--Protonix '40mg'$  PO BID x 21 days; followed by '40mg'$  PO daily ?--Transfuse for hemoglobin< 7.0 or active bleeding ? ?Elevated troponin ?High sensitive troponin 30> 37> 47.  Denies chest pain.  No concerning dynamic changes on EKG. ?--  Continue monitor on telemetry ? ?B12 deficiency ?Vitamin B12 214, low normal. ?--Started on Vitamin B12 1000 mcg p.o. daily ? ?Dementia ([place]) ?Spouse reports mild cognitive impairment.  On Aricept  outpatient, currently holding as small percentage associated with syncope. ?--Delirium precautions ?--Get up during the day ?--Encourage a familiar face to remain present throughout the day ?--Keep blinds open and lights on during daylight hours ?--Minimize the use of opioids/benzodiazepines ? ?BPH (benign prostatic hyperplasia) ?--Continue avodart  ? ?Physical deconditioning ?--Continue PT/OT efforts while inpatient ?--TOC consulted for SNF placement, spouse prefers [facility] ? ?Dysphagia ?Seen by speech therapy, underwent modified barium swallow on 3/20 with finding of aspiration/penetration due to anatomical difference and cognitive linguistic impairments.  Discussed with patient and spouse that he set risk for aspiration events or developing pneumonia in the future.  S noted that patient would not want his liquids thickened at this time.  Continue aspiration precautions. ?Dysphagia 3 (Mech soft);Thin liquid  ? Liquid Administration via: Cup;Straw ?Medication Administration: Whole meds with puree ?Supervision: Full supervision/cueing for compensatory strategies ?Compensations: Slow rate;Small sips/bites (Avoid consecutive swallows) ?Postural Changes: Seated upright at 90 degrees ? ? ? ?DVT prophylaxis: SCDs Start: 07/31/21 1350 ?Place TED hose Start: 07/31/21 1350 ? ?  Code Status: DNR ?Family Communication: Updated spouse present at bedside this morning ? ?Disposition Plan:  ?Level of care: Telemetry Medical ?Status is: Inpatient ?Remains inpatient appropriate because: Pending SNF placement ?  ? ?Consultants:  ?[facility] - signed off 3/17 ? ?Procedures:  ?TTE ? ?Antimicrobials:  ?None ? ? ?Subjective: ?Patient seen examined bedside, resting comfortably.  Lying in bed.  Spouse present.  No specific complaints this morning.  Waiting for breakfast to arrive.  Hopeful for discharge to [facility] SNF on Thursday if bed available.  No other specific complaints or concerns at this time.  Patient denies  headache, no dizziness, no chest pain, no palpitations, no shortness of breath, no fever/chills/night sweats, no nausea/vomiting/diarrhea, no weakness, no fatigue, no paresthesias, no cough/congestion.  No acute events overnight per nursing staff. ? ?Objective: ?Vitals:  ? 08/04/21 1953 08/04/21 2328 08/05/21 7619 08/05/21 5093  ?BP: 129/77 134/86 (!) 143/75 (!) 150/97  ?Pulse: 69 68 63 72  ?Resp: '16 16 16 18  '$ ?Temp: 98.4 ?F (36.9 ?C) 98 ?F (36.7 ?C) 97.7 ?F (36.5 ?C) 98.1 ?F (36.7 ?C)  ?TempSrc: Oral Oral Oral Oral  ?SpO2: 97% 90% 97% 97%  ?Weight:      ?Height:      ? ? ?Intake/Output Summary (Last 24 hours) at 08/05/2021 1051 ?Last data filed at 08/05/2021 0701 ?Gross per 24 hour  ?Intake 360 ml  ?Output 550 ml  ?Net -190 ml  ? ? ?Filed Weights  ? 07/31/21 1001 08/02/21 0500  ?Weight: 65.8 kg 66.1 kg  ? ? ?Examination: ? ?Physical Exam: ?GEN: NAD, alert and oriented to person Airline pilot), [facility]) but not time, elderly in appearance ?HEENT: NCAT, PERRL, EOMI, sclera clear, MMM ?PULM: CTAB w/o wheezes/crackles, normal respiratory effort, on room air ?CV: RRR w/o M/G/R ?GI: abd soft, NTND, NABS, no R/G/M ?MSK: no peripheral edema, muscle strength globally intact 5/5 bilateral upper/lower extremities ?NEURO: CN II-XII intact, no focal deficits, sensation to light touch intact ?PSYCH: normal mood/affect ?Integumentary: dry/intact, no rashes or wounds ? ? ? ?Data Reviewed: I have personally reviewed following labs and imaging studies ? ?CBC: ?Recent Labs  ?Lab 07/31/21 ?1005 07/31/21 ?1349 07/31/21 ?2030 08/01/21 ?0435 08/01/21 ?0732 08/01/21 ?1352 08/02/21 ?2671 08/03/21 ?2458 08/04/21 ?0325  ?  WBC 13.4* 15.6* 10.6* 8.7 9.5  --  9.2  --   --   ?NEUTROABS 11.1*  --   --   --   --   --   --   --   --   ?HGB 9.1* 9.0* 8.3* 8.0* 8.3* 7.2* 9.2* 9.1* 9.2*  ?HCT 27.6* 28.0* 25.4* 23.9* 24.8* 22.2* 27.7* 26.3* 28.2*  ?MCV 95.2 96.2 94.4 93.4 93.6  --  91.7  --   --   ?PLT 237 245 236 216 211  --  212   --   --   ? ?Basic Metabolic Panel: ?Recent Labs  ?Lab 07/31/21 ?1005 08/01/21 ?0435 08/02/21 ?1583 08/03/21 ?0940 08/04/21 ?0325  ?NA 137 139 138  --  138  ?K 3.8 3.4* 3.7 3.5 3.8  ?CL 103 107 108  -

## 2021-08-05 NOTE — Progress Notes (Signed)
Physical Therapy Treatment ?Patient Details ?Name: Kevin Page ?MRN: [id number] ?DOB: 02-Jun-1930 ?Today's Date: 08/05/2021 ? ? ?History of Present Illness pt is an 88 y/o male admitted 3/16 with syncope/unresponsiveness.  Work up found anemia.  MRI pending.  PMHx MCI, BPH, HTN, TIA's, rectal bleed. ? ?  ?PT Comments  ? ? Patient is progressing towards goals with skilled PT interventions performed today including repetitions of sit to stands and ambulation distance with education and knowledge of use of DME. Patient required min A to max assist with functional mobility today with use of RW for BUE support, with cues for RW proximity and standing erect. Pt continues to have decreased cognition during session, with increased distractibility about having to urinate, decreased safety awareness, and confusion about being in the "wrong room".  Pt will benefit from continued skilled PT to maximize pt's safety and independence with all functional mobility. ?   ?Recommendations for follow up therapy are one component of a multi-disciplinary discharge planning process, led by the attending physician.  Recommendations may be updated based on patient status, additional functional criteria and insurance authorization. ? ?Follow Up Recommendations ? Skilled nursing-short term rehab (<3 hours/day) (desires [facility]) ?  ?  ?Assistance Recommended at Discharge Frequent or constant Supervision/Assistance  ?Patient can return home with the following A lot of help with walking and/or transfers;A little help with bathing/dressing/bathroom;Direct supervision/assist for medications management;Direct supervision/assist for financial management;Assistance with cooking/housework;Assist for transportation;Help with stairs or ramp for entrance ?  ?Equipment Recommendations ? None recommended by PT  ?  ?Recommendations for Other Services   ? ? ?  ?Precautions / Restrictions Precautions ?Precautions: Fall ?Restrictions ?Weight Bearing  Restrictions: No  ?  ? ?Mobility ? Bed Mobility ?  ?  ?  ?  ?  ?  ?  ?General bed mobility comments: pt in recliner upon arrival ?  ? ?Transfers ?Overall transfer level: Needs assistance ?Equipment used: Rolling walker (2 wheels) ?Transfers: Sit to/from Stand ?Sit to Stand: From elevated surface, Min assist ?  ?  ?  ?  ?  ?  ?  ? ?Ambulation/Gait ?Ambulation/Gait assistance: Mod assist, +2 physical assistance ?  ?  ?Gait Pattern/deviations: Step-to pattern, Shuffle, Trunk flexed ?Gait velocity: average ?  ?  ?General Gait Details: pt with significant forward trunk flexion and leaning on RW with outstretched arms with gait. Pt also takes unsteady short, uncoordinated steps, often shuffling feet. Pt required VC's to stand tall and keep RW closer in proximity; pt limited carryover with cues given. Pt impulsive with ambulation and turning with gait, not listening to cues. Pt became agitated throughout session since he was unable to pee even though he had urge ? ? ?Stairs ?  ?  ?  ?  ?  ? ? ?Wheelchair Mobility ?  ? ?Modified Rankin (Stroke Patients Only) ?  ? ? ?  ?Balance Overall balance assessment: Needs assistance ?Sitting-balance support: No upper extremity supported, Feet supported ?Sitting balance-Leahy Scale: Good ?Sitting balance - Comments: able to sit without UE support in sitting. ?  ?Standing balance support: Bilateral upper extremity supported, During functional activity, Reliant on assistive device for balance ?Standing balance-Leahy Scale: Poor ?Standing balance comment: pt reliant on RW and BUE support in standing with mod Ax2 to ambulate to the bathroom. Pt with posterior lean while standing at toilet to attempt urination without UE support, requiring mod  to max A. ?  ?  ?  ?  ?  ?  ?  ?  ?  ?  ?  ?  ? ?  ?  Cognition Arousal/Alertness: Awake/alert ?Behavior During Therapy: Flat affect ?Overall Cognitive Status: History of cognitive impairments - at baseline ?  ?  ?  ?  ?  ?  ?  ?  ?  ?  ?  ?  ?  ?  ?  ?   ?General Comments: asleep upon entry, however, willing to participate in mobility. Pt asked "is this my room?" and "where's the other bed?" at end of session. Pt reminded that he never left room durring treatment and only has one bed in the room. ?  ?  ? ?  ?Exercises   ? ?  ?General Comments General comments (skin integrity, edema, etc.): pt very internal distracted about not being able to urinate. Pt's condom cath removed as it was about to fall off, pt unable able to void into urinal or standing over toliet ?  ?  ? ?Pertinent Vitals/Pain Pain Assessment ?Pain Assessment: Faces ?Faces Pain Scale: No hurt  ? ? ?Home Living Family/patient expects to be discharged to:: Skilled nursing facility ?Living Arrangements: Spouse/significant other ?Available Help at Discharge: Family;Available 24 hours/day ?Type of Home: House (townhome) ?Home Access: Stairs to enter ?Entrance Stairs-Rails: Right;Left ?Entrance Stairs-Number of Steps: 5 ?  ?Home Layout: Two level;Able to live on main level with bedroom/bathroom ?Home Equipment: Wheelchair - manual;Shower seat;Cane - single point;Grab bars - tub/shower;Rollator (4 wheels) ?Additional Comments: not sure of RW  ?  ?Prior Function    ?  ?  ?   ? ?PT Goals (current goals can now be found in the care plan section) Acute Rehab PT Goals ?Patient Stated Goal: wife wants him to go to SNF, [facility] ?PT Goal Formulation: With patient/family ?Time For Goal Achievement: 08/19/21 ?Potential to Achieve Goals: Good ?Progress towards PT goals: Progressing toward goals ? ?  ?Frequency ? ? ? Min 3X/week ? ? ? ?  ?PT Plan Current plan remains appropriate  ? ? ?Co-evaluation   ?  ?  ?  ?  ? ?  ?AM-PAC PT "6 Clicks" Mobility   ?Outcome Measure ? Help needed turning from your back to your side while in a flat bed without using bedrails?: A Little ?Help needed moving from lying on your back to sitting on the side of a flat bed without using bedrails?: A Little ?Help needed moving to and from a bed  to a chair (including a wheelchair)?: A Lot ?Help needed standing up from a chair using your arms (e.g., wheelchair or bedside chair)?: A Lot ?Help needed to walk in hospital room?: A Lot ?Help needed climbing 3-5 steps with a railing? : Total ?6 Click Score: 13 ? ?  ?End of Session Equipment Utilized During Treatment: Gait belt ?Activity Tolerance: Patient tolerated treatment well;Patient limited by fatigue ?Patient left: in chair;with chair alarm set;with call bell/phone within reach;with family/visitor present ?Nurse Communication: Mobility status;Other (comment) (place condom cath on) ?PT Visit Diagnosis: Unsteadiness on feet (R26.81);Muscle weakness (generalized) (M62.81);Difficulty in walking, not elsewhere classified (R26.2) ?  ? ? ?Time: 1330-1400 ?PT Time Calculation (min) (ACUTE ONLY): 30 min ? ?Charges:  $Gait Training: 8-22 mins ?$Therapeutic Activity: 8-22 mins          ?          ? ?Jonne Ply, SPT ? ? ?Jonne Ply ?08/05/2021, 3:31 PM ? ?

## 2021-08-06 DIAGNOSIS — D649 Anemia, unspecified: Secondary | ICD-10-CM

## 2021-08-06 LAB — CBC
HCT: 28.7 % — ABNORMAL LOW (ref 39.0–52.0)
Hemoglobin: 9.4 g/dL — ABNORMAL LOW (ref 13.0–17.0)
MCH: 30.8 pg (ref 26.0–34.0)
MCHC: 32.8 g/dL (ref 30.0–36.0)
MCV: 94.1 fL (ref 80.0–100.0)
Platelets: 263 10*3/uL (ref 150–400)
RBC: 3.05 MIL/uL — ABNORMAL LOW (ref 4.22–5.81)
RDW: 14.7 % (ref 11.5–15.5)
WBC: 6.8 10*3/uL (ref 4.0–10.5)
nRBC: 0 % (ref 0.0–0.2)

## 2021-08-06 LAB — BASIC METABOLIC PANEL
Anion gap: 7 (ref 5–15)
BUN: 13 mg/dL (ref 8–23)
CO2: 27 mmol/L (ref 22–32)
Calcium: 8.6 mg/dL — ABNORMAL LOW (ref 8.9–10.3)
Chloride: 105 mmol/L (ref 98–111)
Creatinine, Ser: 0.88 mg/dL (ref 0.61–1.24)
GFR, Estimated: >60 mL/min (ref >60)
Glucose, Bld: 85 mg/dL (ref 70–99)
Potassium: 4 mmol/L (ref 3.5–5.1)
Sodium: 139 mmol/L (ref 135–145)

## 2021-08-06 NOTE — Progress Notes (Signed)
?PROGRESS NOTE ? ? ? ?Kevin Page  WLS:[id number] DOB: [birth year] DOA: 07/31/2021 ?PCP: Ginger Organ., MD  ? ?Chief Complaint  ?Patient presents with  ? Loss of Consciousness  ? ? ?Brief Narrative:  ? ?Kevin Page is a 88 year old male with past medical history significant for mild cognitive impairment, BPH, essential hypertension, history of TIA, history of rectal bleed 2022 presumed to be diverticular, history of SVT who presented to [facility] ED on 3/16 via EMS with reported syncopal episode.  ?CT head without contrast with chronic changes with no acute intracranial process.  Chest x-ray with no evidence of acute cardiopulmonary disease process.  Initial FOBT negative, repeat positive.  Positive orthostatic vital signs.  EDP started patient on IV fluids.  TRH consulted for further evaluation and management of syncope, GI bleed. ? ? ? ?Assessment & Plan: ?  ?Principal Problem: ?  Syncope and collapse ?Active Problems: ?  Anemia, GI Bleed ?  Elevated troponin ?  B12 deficiency ?  Dementia ([place]) ?  BPH (benign prostatic hyperplasia) ?  Dysphagia ?  Physical deconditioning ? ?Syncope and collapse ?Probably secondary to orthostatic hypotension ?CT head without contrast is negative for acute intracranial findings. ?Echocardiogram showed preserved left ventricular ejection fraction without any regional wall abnormalities. ?MRI of the brain without contrast does not show any evidence of acute intracranial abnormality. ?No seizure activity noted ?Repeat orthostatic vital signs have improved ?Recommend TED hoses and therapy evaluations recommending SNF. ? ? ?Elevated troponins probably from demand ischemia from orthostatic hypotension ? ? ? ?Acute blood loss anemia secondary to GI bleed ?Patient has a history of diverticular bleed in the past ?Stool for occult blood is positive. ?Therapy evaluations showed low normal B12 levels. ?About a GI consulted, recommended conservative measures at this time and outpatient follow-up  in about 4 weeks ?Patient required 1 unit PRBC transfusion during hospitalization and hemoglobin has been stable around 9. ?Continue with Protonix 40 mg twice daily for 3 weeks followed by daily Protonix. ? ? ? ? ?Dementia with mild cognitive impairment ?No behavioral abnormalities. ? ? ? ?Dysphagia ?SLP on board recommending dysphagia 3 diet with thin liquids. ? ? ?BPH ?Continue with Avodart ? ? ? ?Therapy evaluations recommending SNF. ? ? ? ?DVT prophylaxis: scd's ?Code Status: DNR ?Family Communication: wife at bedside.  ?Disposition:  ? ?Status is: Inpatient ?Remains inpatient appropriate because: Currently waiting for SNF bed ?  ?Consultants:  ?gi ? ?Procedures: echo ?Antimicrobials: none.  ? ? ?Subjective: ?No new complaints. Spouse at bedside, she reports that they are hopeful for discharge to [facility] tomorrow.  ?He denies any new complaints.  ? ?Objective: ?Vitals:  ? 08/05/21 2340 08/06/21 0416 08/06/21 0736 08/06/21 1114  ?BP: (!) 148/80 (!) 152/68 139/60 (!) 126/56  ?Pulse: 69 63 63 61  ?Resp: '18 19 14 14  '$ ?Temp: 98 ?F (36.7 ?C) 98.1 ?F (36.7 ?C) 98 ?F (36.7 ?C) 98 ?F (36.7 ?C)  ?TempSrc:   Oral Oral  ?SpO2: 92% 96% 96% 97%  ?Weight:  61.3 kg    ?Height:      ? ? ?Intake/Output Summary (Last 24 hours) at 08/06/2021 1328 ?Last data filed at 08/06/2021 0930 ?Gross per 24 hour  ?Intake 240 ml  ?Output 300 ml  ?Net -60 ml  ? ?Filed Weights  ? 07/31/21 1001 08/02/21 0500 08/06/21 0416  ?Weight: 65.8 kg 66.1 kg 61.3 kg  ? ? ?Examination: ? ?General exam: Appears calm and comfortable  ?Respiratory system: Clear to auscultation. Respiratory effort normal. ?Cardiovascular  system: S1 & S2 heard, RRR. No JVD, . No pedal edema. ?Gastrointestinal system: Abdomen is nondistended, soft and nontender.  Normal bowel sounds heard. ?Central nervous system: Alert and oriented. No focal neurological deficits. ?Extremities: Symmetric 5 x 5 power. ?Skin: No rashes, lesions or ulcers ?Psychiatry:  Mood & affect appropriate.   ? ? ? ?Data Reviewed: I have personally reviewed following labs and imaging studies ? ?CBC: ?Recent Labs  ?Lab 07/31/21 ?1005 07/31/21 ?1349 07/31/21 ?2030 08/01/21 ?0435 08/01/21 ?0732 08/01/21 ?1352 08/02/21 ?0017 08/03/21 ?4944 08/04/21 ?9675 08/06/21 ?0232  ?WBC 13.4*   < > 10.6* 8.7 9.5  --  9.2  --   --  6.8  ?NEUTROABS 11.1*  --   --   --   --   --   --   --   --   --   ?HGB 9.1*   < > 8.3* 8.0* 8.3* 7.2* 9.2* 9.1* 9.2* 9.4*  ?HCT 27.6*   < > 25.4* 23.9* 24.8* 22.2* 27.7* 26.3* 28.2* 28.7*  ?MCV 95.2   < > 94.4 93.4 93.6  --  91.7  --   --  94.1  ?PLT 237   < > 236 216 211  --  212  --   --  263  ? < > = values in this interval not displayed.  ? ? ?Basic Metabolic Panel: ?Recent Labs  ?Lab 07/31/21 ?1005 08/01/21 ?0435 08/02/21 ?9163 08/03/21 ?8466 08/04/21 ?5993 08/06/21 ?0232  ?NA 137 139 138  --  138 139  ?K 3.8 3.4* 3.7 3.5 3.8 4.0  ?CL 103 107 108  --  104 105  ?CO2 '26 26 23  '$ --  28 27  ?GLUCOSE 105* 93 84  --  97 85  ?BUN 41* 28* 16  --  15 13  ?CREATININE 0.81 0.77 0.74  --  0.61 0.88  ?CALCIUM 8.0* 8.2* 8.3*  --  8.5* 8.6*  ?MG  --   --  1.8 1.9 1.9  --   ? ? ?GFR: ?Estimated Creatinine Clearance: 50.3 mL/min (by C-G formula based on SCr of 0.88 mg/dL). ? ?Liver Function Tests: ?Recent Labs  ?Lab 07/31/21 ?1005  ?AST 22  ?ALT 12  ?ALKPHOS 42  ?BILITOT 0.6  ?PROT 5.0*  ?ALBUMIN 3.0*  ? ? ?CBG: ?Recent Labs  ?Lab 07/31/21 ?1017 08/01/21 ?0810 08/02/21 ?0640  ?GLUCAP 76 83 98  ? ? ? ?Recent Results (from the past 240 hour(s))  ?Resp Panel by RT-PCR (Flu A&B, Covid) Nasopharyngeal Swab     Status: None  ? Collection Time: 07/31/21 10:10 AM  ? Specimen: Nasopharyngeal Swab; Nasopharyngeal(NP) swabs in vial transport medium  ?Result Value Ref Range Status  ? SARS Coronavirus 2 by RT PCR NEGATIVE NEGATIVE Final  ?  Comment: (NOTE) ?SARS-CoV-2 target nucleic acids are NOT DETECTED. ? ?The SARS-CoV-2 RNA is generally detectable in upper respiratory ?specimens during the acute phase of infection. The  lowest ?concentration of SARS-CoV-2 viral copies this assay can detect is ?138 copies/mL. A negative result does not preclude SARS-Cov-2 ?infection and should not be used as the sole basis for treatment or ?other patient management decisions. A negative result may occur with  ?improper specimen collection/handling, submission of specimen other ?than nasopharyngeal swab, presence of viral mutation(s) within the ?areas targeted by this assay, and inadequate number of viral ?copies(<138 copies/mL). A negative result must be combined with ?clinical observations, patient history, and epidemiological ?information. The expected result is Negative. ? ?Fact Sheet for Patients:  ?EntrepreneurPulse.com.au ? ?  Fact Sheet for Healthcare Providers:  ?IncredibleEmployment.be ? ?This test is no t yet approved or cleared by the [place] FDA and  ?has been authorized for detection and/or diagnosis of SARS-CoV-2 by ?FDA under an Emergency Use Authorization (EUA). This EUA will remain  ?in effect (meaning this test can be used) for the duration of the ?COVID-19 declaration under Section 564(b)(1) of the Act, 21 ?U.S.C.section 360bbb-3(b)(1), unless the authorization is terminated  ?or revoked sooner.  ? ? ?  ? Influenza A by PCR NEGATIVE NEGATIVE Final  ? Influenza B by PCR NEGATIVE NEGATIVE Final  ?  Comment: (NOTE) ?The Xpert Xpress SARS-CoV-2/FLU/RSV plus assay is intended as an aid ?in the diagnosis of influenza from Nasopharyngeal swab specimens and ?should not be used as a sole basis for treatment. Nasal washings and ?aspirates are unacceptable for Xpert Xpress SARS-CoV-2/FLU/RSV ?testing. ? ?Fact Sheet for Patients: ?EntrepreneurPulse.com.au ? ?Fact Sheet for Healthcare Providers: ?IncredibleEmployment.be ? ?This test is not yet approved or cleared by the [place] FDA and ?has been authorized for detection and/or diagnosis of SARS-CoV-2 by ?FDA under  an Emergency Use Authorization (EUA). This EUA will remain ?in effect (meaning this test can be used) for the duration of the ?COVID-19 declaration under Section 564(b)(1) of the Act, 21 U.S.C. ?section 360bbb-3(b

## 2021-08-06 NOTE — Progress Notes (Signed)
Occupational Therapy Treatment ?Patient Details ?Name: Kevin Page ?MRN: [id number] ?DOB: 31-Mar-1931 ?Today's Date: 08/06/2021 ? ? ?History of present illness pt is an 88 y/o male admitted 3/16 with syncope/unresponsiveness.  Work up found anemia.  MRI pending.  PMHx MCI, BPH, HTN, TIA's, rectal bleed. ?  ?OT comments ? Pt is making steady progress with OT at this time.  He is able to complete selfcare tasks sit to stand at max assist for toileting with min assist for transfers using the RW for support.  Feel he will continue with acute care OT for progression with ADL tasks and reduce caregiver dependency, however still agree with SNF placement for short term rehab.    ? ?Recommendations for follow up therapy are one component of a multi-disciplinary discharge planning process, led by the attending physician.  Recommendations may be updated based on patient status, additional functional criteria and insurance authorization. ?   ?Follow Up Recommendations ? Skilled nursing-short term rehab (<3 hours/day)  ?  ?Assistance Recommended at Discharge Frequent or constant Supervision/Assistance  ?Patient can return home with the following ? A little help with walking and/or transfers;Assistance with cooking/housework;Assist for transportation;Direct supervision/assist for medications management;Help with stairs or ramp for entrance;A little help with bathing/dressing/bathroom ?  ?Equipment Recommendations ? None recommended by OT  ?  ?   ?Precautions / Restrictions Precautions ?Precautions: Fall ?Restrictions ?Weight Bearing Restrictions: No  ? ? ?  ? ?Mobility Bed Mobility ?Overal bed mobility: Needs Assistance ?Bed Mobility: Supine to Sit ?  ?  ?Supine to sit: Min assist (HOB flat) ?  ?  ?  ?  ? ?Transfers ?Overall transfer level: Needs assistance ?Equipment used: Rolling walker (2 wheels) ?Transfers: Sit to/from Stand, Bed to chair/wheelchair/BSC ?Sit to Stand: Min assist ?Stand pivot transfers: Min assist ?  ?  ?  ?   ?General transfer comment: Pt needed min assist for sit to stand with mod instructional cueing for hand placement on the surfaces when standing and when sitting.  He also needed mod instructional cueing for staying inside of the walker as he tends to push it too far ahead, increasing his current baseline kyphotic posturing. ?  ?  ?Balance Overall balance assessment: Needs assistance ?Sitting-balance support: No upper extremity supported, Feet supported ?Sitting balance-Leahy Scale: Good ?  ?  ?Standing balance support: Bilateral upper extremity supported, During functional activity, Reliant on assistive device for balance ?Standing balance-Leahy Scale: Poor ?Standing balance comment: Pt requires UE support on the walker with standing and with mobility. ?  ?  ?  ?  ?  ?  ?  ?  ?  ?  ?  ?   ? ?ADL either performed or assessed with clinical judgement  ? ?ADL Overall ADL's : Needs assistance/impaired ?Eating/Feeding: Set up;Bed level ?  ?  ?  ?  ?  ?  ?  ?  ?  ?  ?  ?  ?  ?Toileting- Clothing Manipulation and Hygiene: Maximal assistance;Sit to/from stand ?Toileting - Clothing Manipulation Details (indicate cue type and reason): Pt contiues with slight bowel incontinence requiring max assist for toilet hygiene sit to stand. ?  ?  ?Functional mobility during ADLs: Minimal assistance;Rolling walker (2 wheels) ?General ADL Comments: Pt more alert and participatory compared to last session with this therapist.  He was able to transfer to the EOB from supine with mod instructional cueing and min assist.  Sit to stand from the EOB with min assist as well as mod instructional cueing for hand  placement on the bed as not to pull up on the RW during toileting.   Pt declined need to toilet or complete any selfcare grooing tasks.  Pt's spouse present and supportive encouraging pt throughout. ?  ? ? ?   ?   ?   ? ?Cognition Arousal/Alertness: Awake/alert ?Behavior During Therapy: Flat affect ?Overall Cognitive Status: History of  cognitive impairments - at baseline ?  ?  ?  ?  ?  ?  ?  ?  ?  ?  ?  ?  ?  ?  ?  ?  ?General Comments: Pt needs mod instructional cueing for all sit to stand with regards to hand placement as well as for staying closer to the walker during mobility. ?  ?  ?   ?   ?   ?   ? ? ?Pertinent Vitals/ Pain       Pain Assessment ?Pain Assessment: Faces ?Faces Pain Scale: No hurt ? ?   ?   ? ?Frequency ? Min 2X/week  ? ? ? ? ?  ?Progress Toward Goals ? ?OT Goals(current goals can now be found in the care plan section) ? Progress towards OT goals: Progressing toward goals ? ?Acute Rehab OT Goals ?Patient Stated Goal: None stated, but pt agreeable to participate in OT session and transfer to the recliner. ?OT Goal Formulation: With patient ?Time For Goal Achievement: 08/15/21 ?Potential to Achieve Goals: Good  ?Plan Discharge plan remains appropriate   ? ?   ?AM-PAC OT "6 Clicks" Daily Activity     ?Outcome Measure ? ? Help from another person eating meals?: None ?Help from another person taking care of personal grooming?: A Little ?Help from another person toileting, which includes using toliet, bedpan, or urinal?: A Lot ?Help from another person bathing (including washing, rinsing, drying)?: A Little ?Help from another person to put on and taking off regular upper body clothing?: A Little ?Help from another person to put on and taking off regular lower body clothing?: A Lot ?6 Click Score: 17 ? ?  ?End of Session Equipment Utilized During Treatment: Rolling walker (2 wheels) ? ?OT Visit Diagnosis: Muscle weakness (generalized) (M62.81);Unsteadiness on feet (R26.81) ?  ?Activity Tolerance Patient tolerated treatment well;Patient limited by fatigue ?  ?Patient Left in chair;with call bell/phone within reach;with chair alarm set;with family/visitor present ?  ?Nurse Communication Mobility status ?  ? ?   ? ?Time: 9485-4627 ?OT Time Calculation (min): 27 min ? ?Charges: OT General Charges ?$OT Visit: 1 Visit ?OT Treatments ?$Self  Care/Home Management : 23-37 mins ? ? ?Dotti Busey OTR/L ?08/06/2021, 10:04 AM ?

## 2021-08-06 NOTE — Progress Notes (Signed)
Speech Language Pathology Treatment: Dysphagia  ?Patient Details ?Name: Kevin Page ?MRN: [id number] ?DOB: 1930/07/03 ?Today's Date: 08/06/2021 ?Time: 3744-5146 ?SLP Time Calculation (min) (ACUTE ONLY): 15 min ? ?Assessment / Plan / Recommendation ?Clinical Impression ? Upon entry, pt was awake, alert and in pleasant mood. Patient's spouse present throughout the duration of the session. Session primarily focused on providing skilled education to the patient and his spouse regarding PO diet and compensatory strategies. Pt and spouse receptive to the information provided and endorsed that they have been using Yankauer suction during mealtimes in order to remove food/liquid that the pt expels after coughing. Education provided on continuing to administer oral care before and after meals to reduce bacterial load in oral cavity. Wife endorsed only providing oral care after meals, and SLP educated why it is important to initiate and continue oral care regimen before meals in patient's routine. Pt observed with orange juice via straw cup. Pt initiated self-pacing strategy of taking individual sips. No overt s/s of aspiration observed. Spouse verbalized that the patient has been tolerating current Regular Diet with thin liquids. Wife endorsed pt had a harder time with meat (I.e., cheeseburger), but that she cut it up into bite-sized pieces for improved intake and success. SLP recommends continuing pt on Regular Diet with thin liquids for least restrictive diet. SLP to sign off as goals have been met and education complete.  ?  ?HPI HPI: Pt is an 88 y.o. male who presented to the ED for syncope. MRI brain negative. PMH: MCI,  BPH, HTN, hx of TIAs, rectal bleed in 2022 with hospitalization and conservative management, SVT. Swallow evaluation September 2022; cognitively based dysphagia noted; dysphagia 2 initially recommended, but pt then advanced to regular and thin liquids Esophagram 04/02/21: Esophageal dysmotility. ?  ?    ?SLP Plan ? All goals met ? ?  ?  ?Recommendations for follow up therapy are one component of a multi-disciplinary discharge planning process, led by the attending physician.  Recommendations may be updated based on patient status, additional functional criteria and insurance authorization. ?  ? ?Recommendations  ?Diet recommendations: Regular;Thin liquid ?Liquids provided via: Cup;Straw ?Medication Administration: Crushed with puree ?Supervision: Patient able to self feed ?Compensations: Slow rate;Small sips/bites;Clear throat intermittently ?Postural Changes and/or Swallow Maneuvers: Seated upright 90 degrees;Upright 30-60 min after meal  ?   ?    ?   ? ? ? ? Oral Care Recommendations: Oral care QID ?Follow Up Recommendations: Skilled nursing-short term rehab (<3 hours/day) ?Assistance recommended at discharge: Intermittent Supervision/Assistance ?SLP Visit Diagnosis: Dysphagia, oropharyngeal phase (R13.12) ?Plan: All goals met ? ? ? ? ?  ?  ? ? ?Vaughan Sine ? ?08/06/2021, 9:22 AM ?

## 2021-08-07 DIAGNOSIS — D513 Other dietary vitamin B12 deficiency anemia: Secondary | ICD-10-CM

## 2021-08-07 DIAGNOSIS — F0390 Unspecified dementia without behavioral disturbance: Secondary | ICD-10-CM

## 2021-08-07 MED ORDER — CYANOCOBALAMIN 1000 MCG PO TABS: 1000.0000 ug | ORAL_TABLET | Freq: Every day | ORAL | Status: DC

## 2021-08-07 MED ORDER — PANTOPRAZOLE SODIUM 40 MG PO TBEC: 40.0000 mg | DELAYED_RELEASE_TABLET | Freq: Every day | ORAL | 2 refills | Status: DC

## 2021-08-07 MED ORDER — PANTOPRAZOLE SODIUM 40 MG PO TBEC: 40.0000 mg | DELAYED_RELEASE_TABLET | Freq: Two times a day (BID) | ORAL | 0 refills | Status: DC

## 2021-08-07 NOTE — TOC Progression Note (Signed)
LATE NOTE SUBMISSION ? ?Transition of Care (TOC) - Progression Note  ? ? ?Patient Details  ?Name: Derric Dealmeida ?MRN: [id number] ?Date of Birth: [birth year] ? ?Transition of Care (TOC) CM/SW Contact  ?Geralynn Ochs, LCSW ?Phone Number: ?08/07/2021, 1:26 PM ? ?Clinical Narrative:   CSW received update that patient's wife had secured bed for patient at [facility]. CSW left a message for director at [facility] to confirm, awaiting call back.  ? ? ? ?Expected Discharge Plan: [place] ?Barriers to Discharge: Continued Medical Work up, Ship broker ? ?Expected Discharge Plan and Services ?Expected Discharge Plan: [place] ?  ?Discharge Planning Services: CM Consult ?Post Acute Care Choice: Home Health ?Living arrangements for the past 2 months: [place] ?                ?  ?  ?  ?  ?  ?HH Arranged: Therapist, sports, PT ?[place] Agency: [place] ?Date HH Agency Contacted: 08/01/21 ?  ?Representative spoke with at [place]: Levada Dy ? ? ?Social Determinants of Health (SDOH) Interventions ?  ? ?Readmission Risk Interventions ?   ? View : No data to display.  ?  ?  ?  ? ? ?

## 2021-08-07 NOTE — Discharge Summary (Signed)
?Physician Discharge Summary ?  ?Patient: Kevin Page MRN: [id number] DOB: [birth year]  ?Admit date:     07/31/2021  ?Discharge date: 08/08/21  ?Discharge Physician: Hosie Poisson  ? ?PCP: Ginger Organ., MD  ? ?Recommendations at discharge:  ?Please follow up with PCP in one week.  ?Please follow up with GI as recommended.  ? ?Discharge Diagnoses: ?Principal Problem: ?  Syncope and collapse ?Active Problems: ?  Anemia, GI Bleed ?  Elevated troponin ?  B12 deficiency ?  Dementia ([place]) ?  BPH (benign prostatic hyperplasia) ?  Dysphagia ?  Physical deconditioning ? ? ? ?Hospital Course: ? ?Kevin Page is a 88 year old male with past medical history significant for mild cognitive impairment, BPH, essential hypertension, history of TIA, history of rectal bleed 2022 presumed to be diverticular, history of SVT who presented to [facility] ED on 3/16 via EMS with reported syncopal episode.  ?CT head without contrast with chronic changes with no acute intracranial process.  Chest x-ray with no evidence of acute cardiopulmonary disease process.  Initial FOBT negative, repeat positive.  Positive orthostatic vital signs.  EDP started patient on IV fluids.  TRH consulted for further evaluation and management of syncope, GI bleed. ? ? ?Assessment and Plan: ?* Syncope and collapse ?Patient presenting to the ED following witnessed syncopal episode x2 with unresponsiveness.  No reported seizure-like activity.  Was notably orthostatic with low BP on EMS arrival.  Urinalysis, chest x-ray unrevealing.  CT head without contrast with no acute intracranial findings.  TTE with LVEF 65-70%, no regional wall motion abnormalities, mild AR.  MR brain without contrast with no evidence of acute intracranial abnormality, notable for cerebral atrophy.  Etiology likely secondary to orthostatic hypotension from dehydration in the setting of cognitive impairment versus autonomic dysfunction.  Also complicated by anemia. ?--Continue to monitor on  telemetry ?--TED hose, supportive care ?--PT/OT evaluation ?--Fall precautions ? ?Anemia, GI Bleed ?Hemoglobin on arrival 9.1 (baseline 11-12).  History of GI bleed in the past treated conservatively, presumed diverticular.  Denies NSAID abuse, only on aspirin outpatient and no other anticoagulant/antiplatelet.  Initial FOBT was negative, repeat positive.  Patient with elevated BUN on arrival.  Anemia panel with iron 68, TIBC 232 (low), ferritin 33, folate 14.4, B12 214 (low normal).  Seen by [facility] gastroenterology, recommend conservative measures at this time with plan outpatient follow-up in 3-4 weeks.  Suspected etiology possible gastric ulcer given melena. ?--Hgb 9.1>9.0>8.3>8.0>8.3>7.2>9.2>9.1>9.2; stable ?--s/p 1u pRBC 3/17 ?--holding aspirin ?--Protonix '40mg'$  PO BID x 21 days; followed by '40mg'$  PO daily ?--Transfuse for hemoglobin< 7.0 or active bleeding ? ?Elevated troponin ?High sensitive troponin 30> 37> 47.  Denies chest pain.  No concerning dynamic changes on EKG. ?--Continue monitor on telemetry ? ?B12 deficiency ?Vitamin B12 214, low normal. ?--Started on Vitamin B12 1000 mcg p.o. daily ? ?Dementia ([place]) ?Spouse reports mild cognitive impairment.  On Aricept outpatient, currently holding as small percentage associated with syncope. ?--Delirium precautions ?--Get up during the day ?--Encourage a familiar face to remain present throughout the day ?--Keep blinds open and lights on during daylight hours ?--Minimize the use of opioids/benzodiazepines ? ?BPH (benign prostatic hyperplasia) ?--Continue avodart  ? ?Physical deconditioning ?--Continue PT/OT efforts while inpatient ?--TOC consulted for SNF placement, spouse prefers [facility] ? ?Dysphagia ?Seen by speech therapy, underwent modified barium swallow on 3/20 with finding of aspiration/penetration due to anatomical difference and cognitive linguistic impairments.  Discussed with patient and spouse that he set risk for aspiration events or developing  pneumonia in  the future.  S noted that patient would not want his liquids thickened at this time.  Continue aspiration precautions. ?Dysphagia 3 (Mech soft);Thin liquid  ? Liquid Administration via: Cup;Straw ?Medication Administration: Whole meds with puree ?Supervision: Full supervision/cueing for compensatory strategies ?Compensations: Slow rate;Small sips/bites (Avoid consecutive swallows) ?Postural Changes: Seated upright at 90 degrees ? ? ? ? ?  ? ? ?Consultants: gastroenterology  ?Procedures performed: NOne.   ?Disposition: Skilled nursing facility ?Diet recommendation:  ?Discharge Diet Orders (From admission, onward)  ? ?  Start     Ordered  ? 08/07/21 0000  Diet - low sodium heart healthy       ? 08/07/21 1432  ? ?  ?  ? ?  ? ?Dysphagia type 3 thin Liquid ?DISCHARGE MEDICATION: ?Allergies as of 08/07/2021   ?No Known Allergies ?  ? ?  ?Medication List  ?  ? ?STOP taking these medications   ? ?aspirin EC 81 MG tablet ?  ? ?  ? ?TAKE these medications   ? ?cholecalciferol 25 MCG (1000 UNIT) tablet ?Commonly known as: VITAMIN D3 ?Take 1,000 Units by mouth daily. ?  ?cyanocobalamin 1000 MCG tablet ?Take 1 tablet (1,000 mcg total) by mouth daily. ?Start taking on: August 08, 2021 ?  ?diclofenac Sodium 1 % Gel ?Commonly known as: VOLTAREN ?2 g 4 (four) times daily as needed (pain). ?  ?donepezil 10 MG tablet ?Commonly known as: ARICEPT ?Take 10 mg by mouth at bedtime. ?  ?dutasteride 0.5 MG capsule ?Commonly known as: AVODART ?Take 0.5 mg by mouth at bedtime. ?  ?guaiFENesin 600 MG 12 hr tablet ?Commonly known as: [place] ?Take 1 tablet (600 mg total) by mouth 2 (two) times daily. ?What changed: when to take this ?  ?Myrbetriq 25 MG Tb24 tablet ?Generic drug: mirabegron ER ?Take 25 mg by mouth daily. ?  ?OVER THE COUNTER MEDICATION ?Take 2 tablets by mouth daily. Eye supplement from Dr.Digby office ( Macular protection complete) ?  ?pantoprazole 40 MG tablet ?Commonly known as: PROTONIX ?Take 1 tablet (40 mg total)  by mouth 2 (two) times daily for 17 days. ?  ?pantoprazole 40 MG tablet ?Commonly known as: PROTONIX ?Take 1 tablet (40 mg total) by mouth daily. ?Start taking on: August 25, 2021 ?  ? ?  ? ? Follow-up Information   ? ? [facility] Follow up.   ?Specialty: Gastroenterology ?Why: May 9th at 9AM with Dr. Carlean Purl, arrive 15 mins early. ?Contact information: ?[address] ?[place] [phone] ?[phone] ? ?  ?  ? ?  ?  ? ?  ? ?Discharge Exam: ?Filed Weights  ? 08/02/21 0500 08/06/21 0416 08/07/21 0400  ?Weight: 66.1 kg 61.3 kg 59 kg  ? ?General exam: Appears calm and comfortable  ?Respiratory system: Clear to auscultation. Respiratory effort normal. ?Cardiovascular system: S1 & S2 heard, RRR. No JVD, No pedal edema. ?Gastrointestinal system: Abdomen is nondistended, soft and nontender. Normal bowel sounds heard. ?Central nervous system: Alert and oriented. No focal neurological deficits. ?Extremities: Symmetric 5 x 5 power. ?Skin: No rashes, lesions or ulcers ?Psychiatry: Judgement and insight appear normal. Mood & affect appropriate.  ? ? ?Condition at discharge: fair ? ?The results of significant diagnostics from this hospitalization (including imaging, microbiology, ancillary and laboratory) are listed below for reference.  ? ?Imaging Studies: ?CT Head Wo Contrast ? ?Result Date: 07/31/2021 ?CLINICAL DATA:  Altered mental status EXAM: CT HEAD WITHOUT CONTRAST TECHNIQUE: Contiguous axial images were obtained from the base of the  skull through the vertex without intravenous contrast. RADIATION DOSE REDUCTION: This exam was performed according to the departmental dose-optimization program which includes automated exposure control, adjustment of the mA and/or kV according to patient size and/or use of iterative reconstruction technique. COMPARISON:  CT head 04/25/2021 FINDINGS: Brain: No acute intracranial hemorrhage, mass effect, or herniation. No extra-axial fluid collections. No  evidence of acute territorial infarct. No hydrocephalus. Moderate cortical volume loss. Patchy hypodensities in the periventricular and subcortical white matter, likely secondary to chronic microvascular ischemi

## 2021-08-07 NOTE — TOC Progression Note (Signed)
Transition of Care (TOC) - Progression Note  ? ? ?Patient Details  ?Name: Kevin Page ?MRN: [id number] ?Date of Birth: 1930-09-21 ? ?Transition of Care (TOC) CM/SW Contact  ?Geralynn Ochs, LCSW ?Phone Number: ?08/07/2021, 1:30 PM ? ?Clinical Narrative:   CSW updated by wife that [facility] will no longer have a bed for patient. CSW met with patient's wife at bedside to provide other bed offers, and patient's wife to review. CSW asked [place], [place] to review. CSW to follow. ? ? ? ?Expected Discharge Plan: [place] ?Barriers to Discharge: Continued Medical Work up, Ship broker ? ?Expected Discharge Plan and Services ?Expected Discharge Plan: [place] ?  ?Discharge Planning Services: CM Consult ?Post Acute Care Choice: Home Health ?Living arrangements for the past 2 months: [place] ?                ?  ?  ?  ?  ?  ?HH Arranged: Therapist, sports, PT ?[place] Agency: [place] ?Date HH Agency Contacted: 08/01/21 ?  ?Representative spoke with at [place]: Levada Dy ? ? ?Social Determinants of Health (SDOH) Interventions ?  ? ?Readmission Risk Interventions ?   ? View : No data to display.  ?  ?  ?  ? ? ?

## 2021-08-07 NOTE — TOC Progression Note (Signed)
Transition of Care (TOC) - Progression Note  ? ? ?Patient Details  ?Name: Kevin Page ?MRN: [id number] ?Date of Birth: [birth year] ? ?Transition of Care (TOC) CM/SW Contact  ?Geralynn Ochs, LCSW ?Phone Number: ?08/07/2021, 1:27 PM ? ?Clinical Narrative:   CSW spoke with [place] at [facility] to discuss possible bed offer for patient. Per [place], patient's referral is with nursing for review, and they will call CSW after they have reviewed. CSW met with wife at bedside to update. CSW to follow. ? ? ? ?Expected Discharge Plan: [place] ?Barriers to Discharge: Continued Medical Work up, Ship broker ? ?Expected Discharge Plan and Services ?Expected Discharge Plan: [place] ?  ?Discharge Planning Services: CM Consult ?Post Acute Care Choice: Home Health ?Living arrangements for the past 2 months: [place] ?                ?  ?  ?  ?  ?  ?HH Arranged: Therapist, sports, PT ?[place] Agency: [place] ?Date HH Agency Contacted: 08/01/21 ?  ?Representative spoke with at [place]: Levada Dy ? ? ?Social Determinants of Health (SDOH) Interventions ?  ? ?Readmission Risk Interventions ?   ? View : No data to display.  ?  ?  ?  ? ? ?

## 2021-08-08 DIAGNOSIS — K5900 Constipation, unspecified: Secondary | ICD-10-CM | POA: Diagnosis not present

## 2021-08-08 DIAGNOSIS — S0081XA Abrasion of other part of head, initial encounter: Secondary | ICD-10-CM | POA: Diagnosis not present

## 2021-08-08 DIAGNOSIS — K922 Gastrointestinal hemorrhage, unspecified: Secondary | ICD-10-CM | POA: Diagnosis not present

## 2021-08-08 DIAGNOSIS — E538 Deficiency of other specified B group vitamins: Secondary | ICD-10-CM | POA: Diagnosis not present

## 2021-08-08 DIAGNOSIS — D513 Other dietary vitamin B12 deficiency anemia: Secondary | ICD-10-CM | POA: Diagnosis not present

## 2021-08-08 DIAGNOSIS — R1310 Dysphagia, unspecified: Secondary | ICD-10-CM

## 2021-08-08 DIAGNOSIS — R55 Syncope and collapse: Secondary | ICD-10-CM | POA: Diagnosis not present

## 2021-08-08 DIAGNOSIS — G3184 Mild cognitive impairment, so stated: Secondary | ICD-10-CM | POA: Diagnosis not present

## 2021-08-08 DIAGNOSIS — I951 Orthostatic hypotension: Secondary | ICD-10-CM | POA: Diagnosis not present

## 2021-08-08 DIAGNOSIS — Z7401 Bed confinement status: Secondary | ICD-10-CM | POA: Diagnosis not present

## 2021-08-08 DIAGNOSIS — Z7689 Persons encountering health services in other specified circumstances: Secondary | ICD-10-CM | POA: Diagnosis not present

## 2021-08-08 DIAGNOSIS — L299 Pruritus, unspecified: Secondary | ICD-10-CM | POA: Diagnosis not present

## 2021-08-08 DIAGNOSIS — R531 Weakness: Secondary | ICD-10-CM | POA: Diagnosis not present

## 2021-08-08 DIAGNOSIS — R21 Rash and other nonspecific skin eruption: Secondary | ICD-10-CM | POA: Diagnosis not present

## 2021-08-08 DIAGNOSIS — I471 Supraventricular tachycardia: Secondary | ICD-10-CM | POA: Diagnosis not present

## 2021-08-08 DIAGNOSIS — M810 Age-related osteoporosis without current pathological fracture: Secondary | ICD-10-CM | POA: Diagnosis not present

## 2021-08-08 DIAGNOSIS — G4700 Insomnia, unspecified: Secondary | ICD-10-CM | POA: Diagnosis not present

## 2021-08-08 DIAGNOSIS — H3530 Unspecified macular degeneration: Secondary | ICD-10-CM | POA: Diagnosis not present

## 2021-08-08 DIAGNOSIS — F0390 Unspecified dementia without behavioral disturbance: Secondary | ICD-10-CM | POA: Diagnosis not present

## 2021-08-08 DIAGNOSIS — N400 Enlarged prostate without lower urinary tract symptoms: Secondary | ICD-10-CM | POA: Diagnosis not present

## 2021-08-08 DIAGNOSIS — T7840XA Allergy, unspecified, initial encounter: Secondary | ICD-10-CM | POA: Diagnosis not present

## 2021-08-08 DIAGNOSIS — R5381 Other malaise: Secondary | ICD-10-CM | POA: Diagnosis not present

## 2021-08-08 DIAGNOSIS — R69 Illness, unspecified: Secondary | ICD-10-CM | POA: Diagnosis not present

## 2021-08-08 DIAGNOSIS — K625 Hemorrhage of anus and rectum: Secondary | ICD-10-CM | POA: Diagnosis not present

## 2021-08-08 DIAGNOSIS — D649 Anemia, unspecified: Secondary | ICD-10-CM | POA: Diagnosis not present

## 2021-08-08 LAB — GLUCOSE, CAPILLARY: Glucose-Capillary: 90 mg/dL (ref 70–99)

## 2021-08-08 MED ORDER — HYDROCORTISONE 1 % EX CREA
TOPICAL_CREAM | Freq: Three times a day (TID) | CUTANEOUS | Status: DC
  Filled 2021-08-08: qty 28

## 2021-08-08 MED ORDER — HYDROCORTISONE 1 % EX CREA: TOPICAL_CREAM | Freq: Three times a day (TID) | CUTANEOUS | 0 refills | Status: DC

## 2021-08-08 NOTE — TOC Transition Note (Signed)
Transition of Care (TOC) - CM/SW Discharge Note ? ? ?Patient Details  ?Name: Kevin Page ?MRN: [id number] ?Date of Birth: 09/15/1930 ? ?Transition of Care [facility]) CM/SW Contact:  ?Geralynn Ochs, LCSW ?Phone Number: ?08/08/2021, 12:19 PM ? ? ?Clinical Narrative:   CSW confirmed that [place] authorization was approved, can admit to [facility] today. CSW updated [place], sent discharge summary. CSW updated wife at bedside. Transport scheduled with [facility] for next available. ? ?Nurse to call report to [phone], Room 503A. ? ? ? ?Final next level of care: [place] ?Barriers to Discharge: Barriers Resolved ? ? ?Patient Goals and CMS Choice ?  ?CMS Medicare.gov Compare Post Acute Care list provided to:: Patient Represenative (must comment) ?Choice offered to / list presented to : Spouse ? ?Discharge Placement ?  ?           ?Patient chooses bed at: [facility] ?Patient to be transferred to facility by: [facility] ?Name of family member notified: [name] ?Patient and family notified of of transfer: 08/08/21 ? ?Discharge Plan and Services ?  ?Discharge Planning Services: CM Consult ?Post Acute Care Choice: Home Health          ?  ?  ?  ?  ?  ?HH Arranged: Therapist, sports, PT ?[place] Agency: [facility] ?Date HH Agency Contacted: 08/01/21 ?  ?Representative spoke with at [place]: Levada Dy ? ?Social Determinants of Health (SDOH) Interventions ?  ? ? ?Readmission Risk Interventions ?   ? View : No data to display.  ?  ?  ?  ? ? ? ? ? ?

## 2021-08-08 NOTE — Discharge Summary (Signed)
?Physician Discharge Summary ?  ?Patient: Kevin Page MRN: [id number] DOB: [birth year]  ?Admit date:     07/31/2021  ?Discharge date: 08/08/21  ?Discharge Physician: Hosie Poisson  ? ?PCP: Ginger Organ., MD  ? ?Recommendations at discharge:  ?Please follow up with PCP in one week.  ?Please follow up with GI as recommended.  ? ?Discharge Diagnoses: ?Principal Problem: ?  Syncope and collapse ?Active Problems: ?  Anemia, GI Bleed ?  Elevated troponin ?  B12 deficiency ?  Dementia ([place]) ?  BPH (benign prostatic hyperplasia) ?  Dysphagia ?  Physical deconditioning ? ? ? ?Hospital Course: ? ?Kevin Page is a 88 year old male with past medical history significant for mild cognitive impairment, BPH, essential hypertension, history of TIA, history of rectal bleed 2022 presumed to be diverticular, history of SVT who presented to [facility] ED on 3/16 via EMS with reported syncopal episode.  ?CT head without contrast with chronic changes with no acute intracranial process.  Chest x-ray with no evidence of acute cardiopulmonary disease process.  Initial FOBT negative, repeat positive.  Positive orthostatic vital signs.  EDP started patient on IV fluids.  TRH consulted for further evaluation and management of syncope, GI bleed. ? ? ?Assessment and Plan: ?* Syncope and collapse ?Patient presenting to the ED following witnessed syncopal episode x2 with unresponsiveness.  No reported seizure-like activity.  Was notably orthostatic with low BP on EMS arrival.  Urinalysis, chest x-ray unrevealing.  CT head without contrast with no acute intracranial findings.  TTE with LVEF 65-70%, no regional wall motion abnormalities, mild AR.  MR brain without contrast with no evidence of acute intracranial abnormality, notable for cerebral atrophy.  Etiology likely secondary to orthostatic hypotension from dehydration in the setting of cognitive impairment versus autonomic dysfunction.  Also complicated by anemia. ?--Continue to monitor on  telemetry ?--TED hose, supportive care ?--PT/OT evaluation ?--Fall precautions ? ?Anemia, GI Bleed ?Hemoglobin on arrival 9.1 (baseline 11-12).  History of GI bleed in the past treated conservatively, presumed diverticular.  Denies NSAID abuse, only on aspirin outpatient and no other anticoagulant/antiplatelet.  Initial FOBT was negative, repeat positive.  Patient with elevated BUN on arrival.  Anemia panel with iron 68, TIBC 232 (low), ferritin 33, folate 14.4, B12 214 (low normal).  Seen by [facility] gastroenterology, recommend conservative measures at this time with plan outpatient follow-up in 3-4 weeks.  Suspected etiology possible gastric ulcer given melena. ?--Hgb 9.1>9.0>8.3>8.0>8.3>7.2>9.2>9.1>9.2; stable ?--s/p 1u pRBC 3/17 ?--holding aspirin ?--Protonix '40mg'$  PO BID x 21 days; followed by '40mg'$  PO daily ?--Transfuse for hemoglobin< 7.0 or active bleeding ? ?Elevated troponin ?High sensitive troponin 30> 37> 47.  Denies chest pain.  No concerning dynamic changes on EKG. ?--Continue monitor on telemetry ? ?B12 deficiency ?Vitamin B12 214, low normal. ?--Started on Vitamin B12 1000 mcg p.o. daily ? ?Dementia ([place]) ?Spouse reports mild cognitive impairment.  On Aricept outpatient, currently holding as small percentage associated with syncope. ?--Delirium precautions ?--Get up during the day ?--Encourage a familiar face to remain present throughout the day ?--Keep blinds open and lights on during daylight hours ?--Minimize the use of opioids/benzodiazepines ? ?BPH (benign prostatic hyperplasia) ?--Continue avodart  ? ?Physical deconditioning ?--Continue PT/OT efforts while inpatient ?--TOC consulted for SNF placement, spouse prefers [facility] ? ?Dysphagia ?Seen by speech therapy, underwent modified barium swallow on 3/20 with finding of aspiration/penetration due to anatomical difference and cognitive linguistic impairments.  Discussed with patient and spouse that he set risk for aspiration events or developing  pneumonia in  the future.  S noted that patient would not want his liquids thickened at this time.  Continue aspiration precautions. ?Dysphagia 3 (Mech soft);Thin liquid  ? Liquid Administration via: Cup;Straw ?Medication Administration: Whole meds with puree ?Supervision: Full supervision/cueing for compensatory strategies ?Compensations: Slow rate;Small sips/bites (Avoid consecutive swallows) ?Postural Changes: Seated upright at 90 degrees ? ? ?Erythematous itchy rash:  ?Probably contact dermatitis.  ?On hydrocortisone cream prn.  ? ?  ? ? ?Consultants: gastroenterology  ?Procedures performed: NOne.   ?Disposition: Skilled nursing facility ?Diet recommendation:  ?Discharge Diet Orders (From admission, onward)  ? ?  Start     Ordered  ? 08/07/21 0000  Diet - low sodium heart healthy       ? 08/07/21 1432  ? ?  ?  ? ?  ? ?Dysphagia type 3 thin Liquid ?DISCHARGE MEDICATION: ?Allergies as of 08/08/2021   ?No Known Allergies ?  ? ?  ?Medication List  ?  ? ?STOP taking these medications   ? ?aspirin EC 81 MG tablet ?  ? ?  ? ?TAKE these medications   ? ?cholecalciferol 25 MCG (1000 UNIT) tablet ?Commonly known as: VITAMIN D3 ?Take 1,000 Units by mouth daily. ?  ?cyanocobalamin 1000 MCG tablet ?Take 1 tablet (1,000 mcg total) by mouth daily. ?  ?diclofenac Sodium 1 % Gel ?Commonly known as: VOLTAREN ?2 g 4 (four) times daily as needed (pain). ?  ?donepezil 10 MG tablet ?Commonly known as: ARICEPT ?Take 10 mg by mouth at bedtime. ?  ?dutasteride 0.5 MG capsule ?Commonly known as: AVODART ?Take 0.5 mg by mouth at bedtime. ?  ?guaiFENesin 600 MG 12 hr tablet ?Commonly known as: Sentinel ?Take 1 tablet (600 mg total) by mouth 2 (two) times daily. ?What changed: when to take this ?  ?hydrocortisone cream 1 % ?Apply topically 3 (three) times daily. ?  ?Myrbetriq 25 MG Tb24 tablet ?Generic drug: mirabegron ER ?Take 25 mg by mouth daily. ?  ?OVER THE COUNTER MEDICATION ?Take 2 tablets by mouth daily. Eye supplement from Dr.Digby  office ( Macular protection complete) ?  ?pantoprazole 40 MG tablet ?Commonly known as: PROTONIX ?Take 1 tablet (40 mg total) by mouth 2 (two) times daily for 17 days. ?  ?pantoprazole 40 MG tablet ?Commonly known as: PROTONIX ?Take 1 tablet (40 mg total) by mouth daily. ?Start taking on: August 25, 2021 ?  ? ?  ? ? Contact information for follow-up providers   ? ? [facility] Follow up.   ?Specialty: Gastroenterology ?Why: May 9th at 9AM with Dr. Carlean Purl, arrive 15 mins early. ?Contact information: ?[address] ?[place] [address] ?[phone] ? ?  ?  ? ?  ?  ? ? Contact information for after-discharge care   ? ? Destination   ? ? [facility] .   ?Service: Skilled Nursing ?Contact information: ?[address] ?[place] ?[phone] ? ?  ?  ? ?  ?  ? ?  ?  ? ?  ? ?Discharge Exam: ?Filed Weights  ? 08/02/21 0500 08/06/21 0416 08/07/21 0400  ?Weight: 66.1 kg 61.3 kg 59 kg  ? ?General exam: Appears calm and comfortable  ?Respiratory system: Clear to auscultation. Respiratory effort normal. ?Cardiovascular system: S1 & S2 heard, RRR. No JVD, No pedal edema. ?Gastrointestinal system: Abdomen is nondistended, soft and nontender. Normal bowel sounds heard. ?Central nervous system: Alert and oriented. No focal neurological deficits. ?Extremities: Symmetric 5 x 5 power. ?Skin: No rashes, lesions or  ulcers ?Psychiatry: Judgement and insight appear normal. Mood & affect appropriate.  ? ? ?Condition at discharge: fair ? ?The results of significant diagnostics from this hospitalization (including imaging, microbiology, ancillary and laboratory) are listed below for reference.  ? ?Imaging Studies: ?CT Head Wo Contrast ? ?Result Date: 07/31/2021 ?CLINICAL DATA:  Altered mental status EXAM: CT HEAD WITHOUT CONTRAST TECHNIQUE: Contiguous axial images were obtained from the base of the skull through the vertex without intravenous contrast. RADIATION  DOSE REDUCTION: This exam was performed according to the departmental dose-optimization program which includes automated exposure control, adjustment of the mA and/or kV according to patient size and/or use of itera

## 2021-08-08 NOTE — Progress Notes (Signed)
Physical Therapy Treatment ?Patient Details ?Name: Kevin Page ?MRN: [id number] ?DOB: 1931-02-19 ?Today's Date: 08/08/2021 ? ? ?History of Present Illness pt is an 88 y/o male admitted 3/16 with syncope/unresponsiveness.  Work up found anemia.  MRI pending.  PMHx MCI, BPH, HTN, TIA's, rectal bleed. ? ?  ?PT Comments  ? ? Pt making steady progress with mobility. Continue to recommend SNF for further rehab.    ?Recommendations for follow up therapy are one component of a multi-disciplinary discharge planning process, led by the attending physician.  Recommendations may be updated based on patient status, additional functional criteria and insurance authorization. ? ?Follow Up Recommendations ? Skilled nursing-short term rehab (<3 hours/day) ?  ?  ?Assistance Recommended at Discharge Frequent or constant Supervision/Assistance  ?Patient can return home with the following A little help with walking and/or transfers;A little help with bathing/dressing/bathroom;Assistance with cooking/housework;Direct supervision/assist for medications management;Direct supervision/assist for financial management;Assist for transportation;Help with stairs or ramp for entrance ?  ?Equipment Recommendations ? None recommended by PT  ?  ?Recommendations for Other Services   ? ? ?  ?Precautions / Restrictions Precautions ?Precautions: Fall  ?  ? ?Mobility ? Bed Mobility ?  ?  ?  ?  ?  ?  ?  ?General bed mobility comments: pt in recliner upon arrival ?  ? ?Transfers ?Overall transfer level: Needs assistance ?Equipment used: Rolling walker (2 wheels) ?Transfers: Sit to/from Stand ?Sit to Stand: Min assist ?  ?  ?  ?  ?  ?General transfer comment: Assist to bring hips up and for verbal cues for hand placement ?  ? ?Ambulation/Gait ?Ambulation/Gait assistance: Min assist ?Gait Distance (Feet): 100 Feet ?Assistive device: Rolling walker (2 wheels) ?Gait Pattern/deviations: Step-through pattern, Decreased stride length, Trunk flexed ?Gait velocity:  decr ?Gait velocity interpretation: 1.31 - 2.62 ft/sec, indicative of limited community ambulator ?  ?General Gait Details: Assist for balance and support. Repeated verbal cues to stay closer to walker and to stand more erect. ? ? ?Stairs ?  ?  ?  ?  ?  ? ? ?Wheelchair Mobility ?  ? ?Modified Rankin (Stroke Patients Only) ?  ? ? ?  ?Balance Overall balance assessment: Needs assistance ?Sitting-balance support: No upper extremity supported, Feet supported ?Sitting balance-Leahy Scale: Good ?  ?  ?Standing balance support: Bilateral upper extremity supported, During functional activity, Reliant on assistive device for balance ?Standing balance-Leahy Scale: Poor ?Standing balance comment: walker and min guard for static standing ?  ?  ?  ?  ?  ?  ?  ?  ?  ?  ?  ?  ? ?  ?Cognition Arousal/Alertness: Awake/alert ?Behavior During Therapy: Flat affect ?Overall Cognitive Status: History of cognitive impairments - at baseline ?  ?  ?  ?  ?  ?  ?  ?  ?  ?  ?  ?  ?  ?  ?  ?  ?  ?  ?  ? ?  ?Exercises   ? ?  ?General Comments   ?  ?  ? ?Pertinent Vitals/Pain Pain Assessment ?Pain Assessment: No/denies pain  ? ? ?Home Living   ?  ?  ?  ?  ?  ?  ?  ?  ?  ?   ?  ?Prior Function    ?  ?  ?   ? ?PT Goals (current goals can now be found in the care plan section) Progress towards PT goals: Progressing toward goals ? ?  ?Frequency ? ? ?  Min 3X/week ? ? ? ?  ?PT Plan Current plan remains appropriate;Frequency needs to be updated  ? ? ?Co-evaluation   ?  ?  ?  ?  ? ?  ?AM-PAC PT "6 Clicks" Mobility   ?Outcome Measure ? Help needed turning from your back to your side while in a flat bed without using bedrails?: A Little ?Help needed moving from lying on your back to sitting on the side of a flat bed without using bedrails?: A Little ?Help needed moving to and from a bed to a chair (including a wheelchair)?: A Little ?Help needed standing up from a chair using your arms (e.g., wheelchair or bedside chair)?: A Little ?Help needed to walk in  hospital room?: A Little ?Help needed climbing 3-5 steps with a railing? : Total ?6 Click Score: 16 ? ?  ?End of Session Equipment Utilized During Treatment: Gait belt ?Activity Tolerance: Patient tolerated treatment well ?Patient left: in chair;with call bell/phone within reach;with chair alarm set;with family/visitor present ?  ?PT Visit Diagnosis: Unsteadiness on feet (R26.81);Muscle weakness (generalized) (M62.81);Difficulty in walking, not elsewhere classified (R26.2) ?  ? ? ?Time: 8088-1103 ?PT Time Calculation (min) (ACUTE ONLY): 16 min ? ?Charges:  $Gait Training: 8-22 mins          ?          ? ?[facility] PT ?Acute Rehabilitation Services ?Pager [phone] ?Office [phone] ? ? ? ?Shary Decamp [facility] ?08/08/2021, 1:41 PM ? ?

## 2021-08-11 DIAGNOSIS — K5900 Constipation, unspecified: Secondary | ICD-10-CM | POA: Diagnosis not present

## 2021-08-11 DIAGNOSIS — R69 Illness, unspecified: Secondary | ICD-10-CM | POA: Diagnosis not present

## 2021-08-11 DIAGNOSIS — L299 Pruritus, unspecified: Secondary | ICD-10-CM | POA: Diagnosis not present

## 2021-08-11 DIAGNOSIS — R21 Rash and other nonspecific skin eruption: Secondary | ICD-10-CM | POA: Diagnosis not present

## 2021-08-12 DIAGNOSIS — R55 Syncope and collapse: Secondary | ICD-10-CM | POA: Diagnosis not present

## 2021-08-12 DIAGNOSIS — G3184 Mild cognitive impairment, so stated: Secondary | ICD-10-CM | POA: Diagnosis not present

## 2021-08-12 DIAGNOSIS — D649 Anemia, unspecified: Secondary | ICD-10-CM | POA: Diagnosis not present

## 2021-08-12 DIAGNOSIS — I951 Orthostatic hypotension: Secondary | ICD-10-CM | POA: Diagnosis not present

## 2021-08-14 DIAGNOSIS — D649 Anemia, unspecified: Secondary | ICD-10-CM | POA: Diagnosis not present

## 2021-08-14 DIAGNOSIS — S0081XA Abrasion of other part of head, initial encounter: Secondary | ICD-10-CM | POA: Diagnosis not present

## 2021-08-14 DIAGNOSIS — Z7689 Persons encountering health services in other specified circumstances: Secondary | ICD-10-CM | POA: Diagnosis not present

## 2021-08-17 DIAGNOSIS — R55 Syncope and collapse: Secondary | ICD-10-CM | POA: Diagnosis not present

## 2021-08-21 DIAGNOSIS — Z9181 History of falling: Secondary | ICD-10-CM | POA: Diagnosis not present

## 2021-08-21 DIAGNOSIS — N39490 Overflow incontinence: Secondary | ICD-10-CM | POA: Diagnosis not present

## 2021-08-21 DIAGNOSIS — M810 Age-related osteoporosis without current pathological fracture: Secondary | ICD-10-CM | POA: Diagnosis not present

## 2021-08-21 DIAGNOSIS — G319 Degenerative disease of nervous system, unspecified: Secondary | ICD-10-CM | POA: Diagnosis not present

## 2021-08-21 DIAGNOSIS — D649 Anemia, unspecified: Secondary | ICD-10-CM | POA: Diagnosis not present

## 2021-08-21 DIAGNOSIS — E538 Deficiency of other specified B group vitamins: Secondary | ICD-10-CM | POA: Diagnosis not present

## 2021-08-21 DIAGNOSIS — R69 Illness, unspecified: Secondary | ICD-10-CM | POA: Diagnosis not present

## 2021-08-21 DIAGNOSIS — R55 Syncope and collapse: Secondary | ICD-10-CM | POA: Diagnosis not present

## 2021-08-21 DIAGNOSIS — K922 Gastrointestinal hemorrhage, unspecified: Secondary | ICD-10-CM | POA: Diagnosis not present

## 2021-08-26 DIAGNOSIS — G459 Transient cerebral ischemic attack, unspecified: Secondary | ICD-10-CM | POA: Diagnosis not present

## 2021-08-26 DIAGNOSIS — R2689 Other abnormalities of gait and mobility: Secondary | ICD-10-CM | POA: Diagnosis not present

## 2021-08-26 DIAGNOSIS — R159 Full incontinence of feces: Secondary | ICD-10-CM | POA: Diagnosis not present

## 2021-08-26 DIAGNOSIS — E538 Deficiency of other specified B group vitamins: Secondary | ICD-10-CM | POA: Diagnosis not present

## 2021-08-26 DIAGNOSIS — D500 Iron deficiency anemia secondary to blood loss (chronic): Secondary | ICD-10-CM | POA: Diagnosis not present

## 2021-08-26 DIAGNOSIS — N39490 Overflow incontinence: Secondary | ICD-10-CM | POA: Diagnosis not present

## 2021-08-26 DIAGNOSIS — I10 Essential (primary) hypertension: Secondary | ICD-10-CM | POA: Diagnosis not present

## 2021-08-26 DIAGNOSIS — R4701 Aphasia: Secondary | ICD-10-CM | POA: Diagnosis not present

## 2021-08-26 DIAGNOSIS — R1310 Dysphagia, unspecified: Secondary | ICD-10-CM | POA: Diagnosis not present

## 2021-08-26 DIAGNOSIS — R55 Syncope and collapse: Secondary | ICD-10-CM | POA: Diagnosis not present

## 2021-08-28 DIAGNOSIS — G319 Degenerative disease of nervous system, unspecified: Secondary | ICD-10-CM | POA: Diagnosis not present

## 2021-08-28 DIAGNOSIS — R55 Syncope and collapse: Secondary | ICD-10-CM | POA: Diagnosis not present

## 2021-08-28 DIAGNOSIS — E538 Deficiency of other specified B group vitamins: Secondary | ICD-10-CM | POA: Diagnosis not present

## 2021-08-28 DIAGNOSIS — N39490 Overflow incontinence: Secondary | ICD-10-CM | POA: Diagnosis not present

## 2021-08-28 DIAGNOSIS — Z9181 History of falling: Secondary | ICD-10-CM | POA: Diagnosis not present

## 2021-08-28 DIAGNOSIS — D649 Anemia, unspecified: Secondary | ICD-10-CM | POA: Diagnosis not present

## 2021-08-28 DIAGNOSIS — M810 Age-related osteoporosis without current pathological fracture: Secondary | ICD-10-CM | POA: Diagnosis not present

## 2021-08-28 DIAGNOSIS — K922 Gastrointestinal hemorrhage, unspecified: Secondary | ICD-10-CM | POA: Diagnosis not present

## 2021-08-28 DIAGNOSIS — R69 Illness, unspecified: Secondary | ICD-10-CM | POA: Diagnosis not present

## 2021-09-02 DIAGNOSIS — Z9181 History of falling: Secondary | ICD-10-CM | POA: Diagnosis not present

## 2021-09-02 DIAGNOSIS — N39490 Overflow incontinence: Secondary | ICD-10-CM | POA: Diagnosis not present

## 2021-09-02 DIAGNOSIS — E538 Deficiency of other specified B group vitamins: Secondary | ICD-10-CM | POA: Diagnosis not present

## 2021-09-02 DIAGNOSIS — K922 Gastrointestinal hemorrhage, unspecified: Secondary | ICD-10-CM | POA: Diagnosis not present

## 2021-09-02 DIAGNOSIS — R55 Syncope and collapse: Secondary | ICD-10-CM | POA: Diagnosis not present

## 2021-09-02 DIAGNOSIS — R69 Illness, unspecified: Secondary | ICD-10-CM | POA: Diagnosis not present

## 2021-09-02 DIAGNOSIS — G319 Degenerative disease of nervous system, unspecified: Secondary | ICD-10-CM | POA: Diagnosis not present

## 2021-09-02 DIAGNOSIS — M810 Age-related osteoporosis without current pathological fracture: Secondary | ICD-10-CM | POA: Diagnosis not present

## 2021-09-02 DIAGNOSIS — D649 Anemia, unspecified: Secondary | ICD-10-CM | POA: Diagnosis not present

## 2021-09-02 NOTE — Progress Notes (Deleted)
?Cardiology Office Note:   ? ?Date:  09/02/2021  ? ?ID:  Kevin Page, DOB 09/26/1930, MRN [id number] ? ?PCP:  Ginger Organ., MD ?  ?[facility] Providers ?Cardiologist:  None { ? ? ?Referring MD: Ginger Organ., MD  ? ? ?History of Present Illness:   ? ?Kevin Page is a 88 y.o. male with a hx of HTN, HLD, BPH, OA, macular degeneration, habitual alcohol intake, former tobacco abuse (52 yrs, quit 1990s), syncope 2014 (event monitor with SB 40s-50s, brief SVT), mild AI and mildly dilated aortic root in 2014 who was previously followed by Dr. Meda Coffee who now presents to clinic for follow-up. ? ?Per review of the record, the patient had an episode of syncope in 2014. TTE 12/2012 showed EF 55-60%, grade 1 DD, mild AI, mildly dilated aortic root, mild MR, mild-mod dilated. 48 hour Holter showed episodes of sinus bradycardia with longest pause 1.8 seconds, also with a few episodes of SVT, the longest run lasting 12 beats with rate 128 BPM -> he did have recordings of HR in the 40s at that time. This was felt possibly indicative of SSS, although there was no indication for PPM at that time. He also underwent GXT that did not show chronotropic incompetence. ? ?He had another episode of syncope in 01/2016. Workup showed an 52mHg drop in BP in ED with 20 beat rise in HR, suggestive of orthostasis. He was treated with IVF. He had another 2 syncopal episodes in 2018 after prolonged 8 hour drive, the other after sitting in a restaurant. He didn't lose consciousness completely but saw dark circles and his wife states that he was "not present and has no recollection of the episodes till the next day". ? ?Patient was last seen in clinic in 09/2019 where his dementia was progressing. Zio did not show any episodes of significant pauses, heart block, and no afib. No recurrent syncope. ? ?Today, *** ? ?Past Medical History:  ?Diagnosis Date  ? Allergy   ? dust and mites  ? BPH (benign prostatic hyperplasia)   ? Essential  hypertension   ? Hyperlipidemia   ? Macular degeneration   ? Osteoarthritis of both knees   ? Sinus bradycardia   ? SVT (supraventricular tachycardia) ([place]   ? a. Event monitor 2014: few episodes of SVT, longest 12 beats with rate 128.  ? Syncope   ? ? ?Past Surgical History:  ?Procedure Laterality Date  ? CATARACT EXTRACTION W/ INTRAOCULAR LENS  IMPLANT, BILATERAL Bilateral 05/18/2009  ? dupytron contractions  1998 , 2006  ? both hands  ? gi bleeding    ? LUMBAR FUSION  05/18/1992  ? ? ?Current Medications: ?No outpatient medications have been marked as taking for the 09/04/21 encounter (Appointment) with PFreada Bergeron MD.  ?  ? ?Allergies:   Patient has no known allergies.  ? ?Social History  ? ?Socioeconomic History  ? Marital status: Married  ?  Spouse name: Not on file  ? Number of children: Not on file  ? Years of education: Not on file  ? Highest education level: Not on file  ?Occupational History  ? Not on file  ?Tobacco Use  ? Smoking status: Former  ? Smokeless tobacco: Never  ? Tobacco comments:  ?  Smoked for 44 years, quit 1990s  ?Vaping Use  ? Vaping Use: Never used  ?Substance and Sexual Activity  ? Alcohol use: Yes  ?  Alcohol/week: 12.0 standard drinks  ?  Types: 12  Standard drinks or equivalent per week  ?  Comment: 1 drink of gin nightly, occasionally more if going out to dinner.  ? Drug use: No  ? Sexual activity: Not on file  ?Other Topics Concern  ? Not on file  ?Social History Narrative  ? Not on file  ? ?Social Determinants of Health  ? ?Financial Resource Strain: Not on file  ?Food Insecurity: Not on file  ?Transportation Needs: Not on file  ?Physical Activity: Not on file  ?Stress: Not on file  ?Social Connections: Not on file  ?  ? ?Family History: ?The patient's ***family history includes Cancer in his brother. There is no history of Heart attack. ? ?ROS:   ?Please see the history of present illness.    ?*** All other systems reviewed and are negative. ? ?EKGs/Labs/Other Studies  Reviewed:   ? ?The following studies were reviewed today: ?TTE 07/2021: ?IMPRESSIONS  ? 1. Left ventricular ejection fraction, by estimation, is 65 to 70%. The  ?left ventricle has normal function. The left ventricle has no regional  ?wall motion abnormalities. Left ventricular diastolic parameters were  ?normal.  ? 2. Right ventricular systolic function is normal. The right ventricular  ?size is normal.  ? 3. The mitral valve is normal in structure. No evidence of mitral valve  ?regurgitation.  ? 4. The aortic valve is tricuspid. Aortic valve regurgitation is mild.  ? ?Cardiac Monitor 07/2019: ?Sinus bradycardia to sinus tachycardia (38-162 BPM, average 59 BPM). ?62 short runs of SVT, the longest lasting 14 seconds. ?  ?Sinus bradycardia to sinus tachycardia (38-162 BPM, average 59 BPM). 62 short runs of SVT, the longest lasting 14 seconds. No atrial fibrillation, no pauses. ? ?EKG:  EKG is *** ordered today.  The ekg ordered today demonstrates *** ? ?Recent Labs: ?07/31/2021: ALT 12 ?08/04/2021: Magnesium 1.9 ?08/06/2021: BUN 13; Creatinine, Ser 0.88; Hemoglobin 9.4; Platelets 263; Potassium 4.0; Sodium 139  ?Recent Lipid Panel ?No results found for: CHOL, TRIG, HDL, CHOLHDL, VLDL, LDLCALC, LDLDIRECT ? ? ?Risk Assessment/Calculations:   ?{Does this patient have ATRIAL FIBRILLATION?[phone]} ? ?    ? ?Physical Exam:   ? ?VS:  There were no vitals taken for this visit.   ? ?Wt Readings from Last 3 Encounters:  ?08/07/21 130 lb 1.1 oz (59 kg)  ?04/25/21 145 lb 1 oz (65.8 kg)  ?01/22/21 145 lb (65.8 kg)  ?  ? ?GEN: *** Well nourished, well developed in no acute distress ?HEENT: Normal ?NECK: No JVD; No carotid bruits ?LYMPHATICS: No lymphadenopathy ?CARDIAC: ***RRR, no murmurs, rubs, gallops ?RESPIRATORY:  Clear to auscultation without rales, wheezing or rhonchi  ?ABDOMEN: Soft, non-tender, non-distended ?MUSCULOSKELETAL:  No edema; No deformity  ?SKIN: Warm and dry ?NEUROLOGIC:  Alert and oriented x 3 ?PSYCHIATRIC:   Normal affect  ? ?ASSESSMENT:   ? ?No diagnosis found. ?PLAN:   ? ?In order of problems listed above: ? ?#History of Syncope: ?Last episode in 2018. Thought to be mainly due to orthostasis.  ? ?#Bradycardia: ?Multiple monitors with no evidence of pauses. GXT without evidence of chronotropic incompetence. ? ?#Dementia: ?-Continue donepezil '10mg'$  daily ? ?   ? ?{Are you ordering a CV Procedure (e.g. stress test, cath, DCCV, TEE, etc)?   Press F2        :[id number]}  ? ? ?Medication Adjustments/Labs and Tests Ordered: ?Current medicines are reviewed at length with the patient today.  Concerns regarding medicines are outlined above.  ?No orders of the defined types were placed in this encounter. ? ?  No orders of the defined types were placed in this encounter. ? ? ?There are no Patient Instructions on file for this visit.  ? ?Signed, ?Freada Bergeron, MD  ?09/02/2021 7:51 PM    ?[place] ?

## 2021-09-04 ENCOUNTER — Ambulatory Visit: Payer: Medicare HMO | Admitting: Cardiology

## 2021-09-04 ENCOUNTER — Encounter: Payer: Self-pay | Admitting: Cardiology

## 2021-09-04 VITALS — BP 120/74 | HR 65 | Ht 70.0 in | Wt 133.0 lb

## 2021-09-04 DIAGNOSIS — R55 Syncope and collapse: Secondary | ICD-10-CM | POA: Diagnosis not present

## 2021-09-04 DIAGNOSIS — K922 Gastrointestinal hemorrhage, unspecified: Secondary | ICD-10-CM

## 2021-09-04 DIAGNOSIS — R001 Bradycardia, unspecified: Secondary | ICD-10-CM | POA: Diagnosis not present

## 2021-09-04 DIAGNOSIS — I351 Nonrheumatic aortic (valve) insufficiency: Secondary | ICD-10-CM

## 2021-09-04 NOTE — Patient Instructions (Addendum)
Medication Instructions:  ? ?CONTINUE STAYING OFF OF ASPIRIN ? ?*If you need a refill on your cardiac medications before your next appointment, please call your pharmacy* ? ? ?Follow-Up: ?At [facility], you and your health needs are our priority.  As part of our continuing mission to provide you with exceptional heart care, we have created designated Provider Care Teams.  These Care Teams include your primary Cardiologist (physician) and Advanced Practice Providers (APPs -  Physician Assistants and Nurse Practitioners) who all work together to provide you with the care you need, when you need it. ? ?We recommend signing up for the patient portal called "MyChart".  Sign up information is provided on this After Visit Summary.  MyChart is used to connect with patients for Virtual Visits (Telemedicine).  Patients are able to view lab/test results, encounter notes, upcoming appointments, etc.  Non-urgent messages can be sent to your provider as well.   ?To learn more about what you can do with MyChart, go to [email or web address].   ? ?Your next appointment:   ?3 month(s) ? ?The format for your next appointment:   ?In Person ? ?Provider:   ?Robbie Lis, PA-C, Nicholes Rough, PA-C, Melina Copa, PA-C, Ambrose Pancoast, NP, Cecilie Kicks, NP, Ermalinda Barrios, PA-C, Christen Bame, NP, or Richardson Dopp, PA-C     { ? ? ? ?Hypotension ?As your heart beats, it forces blood through your body. This force is called blood pressure. If you have hypotension, you have low blood pressure.  ?When your blood pressure is too low, you may not get enough blood to your brain or other parts of your body. This may cause you to feel weak, light-headed, have a fast heartbeat, or even faint. Low blood pressure may be harmless, or it may cause serious problems. ?What are the causes? ?Blood loss. ?Not enough water in the body (dehydration). ?Heart problems. ?Hormone problems. ?Pregnancy. ?A very bad infection. ?Not having enough of certain  nutrients. ?Very bad allergic reactions. ?Certain medicines. ?What increases the risk? ?Age. The risk increases as you get older. ?Conditions that affect the heart or the brain and spinal cord (central nervous system). ?What are the signs or symptoms? ?Feeling: ?Weak. ?Light-headed. ?Dizzy. ?Tired (fatigued). ?Blurred vision. ?Fast heartbeat. ?Fainting, in very bad cases. ?How is this treated? ?Changing your diet. This may involve drinking more water or including more salt (sodium) in your diet by eating high-salt foods. ?Taking medicines to raise your blood pressure. ?Changing how much you take (the dosage) of some of your medicines. ?Wearing compression stockings. These stockings help to prevent blood clots and reduce swelling in your legs. ?In some cases, you may need to go to the hospital to: ?Receive fluids through an IV tube. ?Receive donated blood through an IV tube (transfusion). ?Get treated for an infection or heart problems, if this applies. ?Be monitored while medicines that you are taking wear off. ?Follow these instructions at home: ?Eating and drinking ?A plate with examples of foods in a healthy diet. ? ?Drink enough fluids to keep your pee (urine) pale yellow. ?Eat a healthy diet. Follow instructions from your doctor about what you can eat or drink. A healthy diet includes: ?Fresh fruits and vegetables. ?Whole grains. ?Low-fat (lean) meats. ?Low-fat dairy products. ?If told, include more salt in your diet. Do not add extra salt to your diet unless your doctor tells you to. ?Eat small meals often. ?Avoid standing up quickly after you eat. ?Medicines ?Take over-the-counter and prescription medicines only as told by your  doctor. ?Follow instructions from your doctor about changing how much you take of your medicines, if this applies. ?Do not stop or change any of your medicines on your own. ?General instructions ?Compression stockings on a person's lower legs. ? ?Wear compression stockings as told by  your doctor. ?Get up slowly from lying down or sitting. ?Avoid hot showers and a lot of heat as told by your doctor. ?Return to your normal activities when your doctor says that it is safe. ?Do not smoke or use any products that contain nicotine or tobacco. If you need help quitting, ask your doctor. ?Keep all follow-up visits. ?Contact a doctor if: ?You vomit. ?You have watery poop (diarrhea). ?You have a fever for more than 2-3 days. ?You feel more thirsty than normal. ?You feel weak and tired. ?Get help right away if: ?You have chest pain. ?You have a fast or uneven heartbeat. ?You lose feeling (have numbness) in any part of your body. ?You cannot move your arms or your legs. ?You have trouble talking. ?You get sweaty or feel light-headed. ?You faint. ?You have trouble breathing. ?You have trouble staying awake. ?You feel mixed up (confused). ?These symptoms may be an emergency. Get help right away. Call 911. ?Do not wait to see if the symptoms will go away. ?Do not drive yourself to the hospital. ?Summary ?Hypotension is also called low blood pressure. It is when the force of blood pumping through your body is too weak. ?Hypotension may be harmless, or it may cause serious problems. ?Treatment may include changing your diet and medicines, and wearing compression stockings. ?In very bad cases, you may need to go to the hospital. ?This information is not intended to replace advice given to you by your health care provider. Make sure you discuss any questions you have with your health care provider. ?Document Revised: 12/23/2020 Document Reviewed: 12/23/2020 ?Elsevier Patient Education ? [place]. ?   ? ? ?Important Information About Sugar ? ? ? ? ? ? ?

## 2021-09-04 NOTE — Progress Notes (Signed)
?Cardiology Office Note:   ? ?Date:  09/04/2021  ? ?ID:  Kevin Page, DOB May 25, 1930, MRN [id number] ? ?PCP:  Ginger Organ., MD ?  ?[facility] Providers ?Cardiologist:  None { ? ? ?Referring MD: Ginger Organ., MD  ? ? ?History of Present Illness:   ? ?Kevin Page is a 88 y.o. male with a hx of HTN, HLD, BPH, OA, macular degeneration, habitual alcohol intake, former tobacco abuse (28 yrs, quit 1990s), syncope 2014 (event monitor with SB 40s-50s, brief SVT), mild AI and mildly dilated aortic root in 2014 who was previously followed by Dr. Meda Coffee who now presents to clinic for follow-up. ? ?Per review of the record, the patient had an episode of syncope in 2014. TTE 12/2012 showed EF 55-60%, grade 1 DD, mild AI, mildly dilated aortic root, mild MR, mild-mod dilated. 48 hour Holter showed episodes of sinus bradycardia with longest pause 1.8 seconds, also with a few episodes of SVT, the longest run lasting 12 beats with rate 128 BPM -> he did have recordings of HR in the 40s at that time. This was felt possibly indicative of SSS, although there was no indication for PPM at that time. He also underwent GXT that did not show chronotropic incompetence. ? ?He had another episode of syncope in 01/2016. Workup showed an 42mHg drop in BP in ED with 20 beat rise in HR, suggestive of orthostasis. He was treated with IVF. He had another 2 syncopal episodes in 2018 after prolonged 8 hour drive, the other after sitting in a restaurant. He didn't lose consciousness completely but saw dark circles and his wife states that he was "not present and has no recollection of the episodes till the next day". ? ?Patient was last seen in clinic in 09/2019 where his dementia was progressing. Zio did not show any episodes of significant pauses, heart block, and no afib. No recurrent syncope. ? ?He is accompanied by his wife, who provides the majority of the history. Today, the patient states that he is "going to survive." He has  no new concerns today. However, his wife reports that he has had 2 recent syncopal episodes. The first was in September 2022, and the second was in March 2023.  Both of these episodes occurred in the setting of a GIB. He was evaluated in the hospital 07/31/2021 following his second episode. On EMS arrival he was notably orthostatic with low BP. At home his blood pressure remains low on average. His aspirin has since been stopped. ? ?She notes that he drinks very little fluid, and does not have much of an appetite. He states that he drinks a lot of fluid and eats regularly, including ice cream. Recently had a barium swallow function test, which reportedly revealed a bony protrusion. He tends to have more difficulty with swallowing liquids. ? ?While sitting, he will elevate his legs whenever possible. ? ?He denies any palpitations, chest pain, or shortness of breath. No lightheadedness, headaches, orthopnea, or PND. ? ? ?Past Medical History:  ?Diagnosis Date  ? Allergy   ? dust and mites  ? BPH (benign prostatic hyperplasia)   ? Essential hypertension   ? Hyperlipidemia   ? Macular degeneration   ? Osteoarthritis of both knees   ? Sinus bradycardia   ? [place]   ? a. Event monitor 2014: few episodes of SVT, longest 12 beats with rate 128.  ? Syncope   ? ? ?Past Surgical History:  ?Procedure Laterality  Date  ? CATARACT EXTRACTION W/ INTRAOCULAR LENS  IMPLANT, BILATERAL Bilateral 05/18/2009  ? dupytron contractions  1998 , 2006  ? both hands  ? gi bleeding    ? LUMBAR FUSION  05/18/1992  ? ? ?Current Medications: ?Current Meds  ?Medication Sig  ? cholecalciferol (VITAMIN D3) 25 MCG (1000 UNIT) tablet Take 1,000 Units by mouth daily.  ? diclofenac Sodium (VOLTAREN) 1 % GEL 2 g 4 (four) times daily as needed (pain).  ? donepezil (ARICEPT) 10 MG tablet Take 10 mg by mouth at bedtime.  ? dutasteride (AVODART) 0.5 MG capsule Take 0.5 mg by mouth at bedtime.  ? guaiFENesin (MUCINEX) 600 MG 12  hr tablet Take 1 tablet (600 mg total) by mouth 2 (two) times daily.  ? hydrocortisone cream 1 % Apply topically 3 (three) times daily.  ? MYRBETRIQ 25 MG TB24 tablet Take 25 mg by mouth daily.  ? OVER THE COUNTER MEDICATION Take 2 tablets by mouth daily. Eye supplement from Dr.Digby office ( Macular protection complete)  ? pantoprazole (PROTONIX) 40 MG tablet Take 1 tablet (40 mg total) by mouth daily.  ? vitamin B-12 1000 MCG tablet Take 1 tablet (1,000 mcg total) by mouth daily.  ?  ? ?Allergies:   Patient has no known allergies.  ? ?Social History  ? ?Socioeconomic History  ? Marital status: Married  ?  Spouse name: Not on file  ? Number of children: Not on file  ? Years of education: Not on file  ? Highest education level: Not on file  ?Occupational History  ? Not on file  ?Tobacco Use  ? Smoking status: Former  ? Smokeless tobacco: Never  ? Tobacco comments:  ?  Smoked for 44 years, quit 1990s  ?Vaping Use  ? Vaping Use: Never used  ?Substance and Sexual Activity  ? Alcohol use: Yes  ?  Alcohol/week: 12.0 standard drinks  ?  Types: 12 Standard drinks or equivalent per week  ?  Comment: 1 drink of gin nightly, occasionally more if going out to dinner.  ? Drug use: No  ? Sexual activity: Not on file  ?Other Topics Concern  ? Not on file  ?Social History Narrative  ? Not on file  ? ?Social Determinants of Health  ? ?Financial Resource Strain: Not on file  ?Food Insecurity: Not on file  ?Transportation Needs: Not on file  ?Physical Activity: Not on file  ?Stress: Not on file  ?Social Connections: Not on file  ?  ? ?Family History: ?The patient's family history includes Cancer in his brother. There is no history of Heart attack. ? ?ROS:   ?Review of Systems  ?Constitutional:  Negative for chills and fever.  ?HENT:  Negative for nosebleeds.   ?Eyes:  Negative for pain.  ?Respiratory:  Negative for hemoptysis and shortness of breath.   ?Cardiovascular:  Negative for chest pain, palpitations, orthopnea, claudication,  leg swelling and PND.  ?Gastrointestinal:  Negative for abdominal pain and blood in stool.  ?Genitourinary:  Negative for urgency.  ?Musculoskeletal:  Negative for back pain.  ?Neurological:  Positive for loss of consciousness. Negative for tingling and seizures.  ?Endo/Heme/Allergies:  Negative for polydipsia.  ?Psychiatric/Behavioral:  Negative for depression and hallucinations.   ? ? ?EKGs/Labs/Other Studies Reviewed:   ? ?The following studies were reviewed today: ? ?TTE 07/2021: ?IMPRESSIONS  ? 1. Left ventricular ejection fraction, by estimation, is 65 to 70%. The  ?left ventricle has normal function. The left ventricle has no regional  ?wall  motion abnormalities. Left ventricular diastolic parameters were  ?normal.  ? 2. Right ventricular systolic function is normal. The right ventricular  ?size is normal.  ? 3. The mitral valve is normal in structure. No evidence of mitral valve  ?regurgitation.  ? 4. The aortic valve is tricuspid. Aortic valve regurgitation is mild.  ? ?Cardiac Monitor 07/2019: ?Sinus bradycardia to sinus tachycardia (38-162 BPM, average 59 BPM). ?62 short runs of SVT, the longest lasting 14 seconds. ?  ?Sinus bradycardia to sinus tachycardia (38-162 BPM, average 59 BPM). 62 short runs of SVT, the longest lasting 14 seconds. No atrial fibrillation, no pauses. ? ?EKG:  EKG is personally reviewed. ?09/04/2021: EKG was not ordered. ?07/31/2021 (ED):  NSR with rate 69 with sinus arrhythmia; nonspecific ST changes with no evidence of acute ischemia. Similar to previous ekg.  ? ?Recent Labs: ?07/31/2021: ALT 12 ?08/04/2021: Magnesium 1.9 ?08/06/2021: BUN 13; Creatinine, Ser 0.88; Hemoglobin 9.4; Platelets 263; Potassium 4.0; Sodium 139  ? ?Recent Lipid Panel ?No results found for: CHOL, TRIG, HDL, CHOLHDL, VLDL, LDLCALC, LDLDIRECT ? ? ?Risk Assessment/Calculations:   ?  ? ?    ? ?Physical Exam:   ? ?VS:  BP 120/74   Pulse 65   Ht '5\' 10"'$  (1.778 m)   Wt 133 lb (60.3 kg)   SpO2 96%   BMI 19.08 kg/m?     ? ?Wt Readings from Last 3 Encounters:  ?09/04/21 133 lb (60.3 kg)  ?08/07/21 130 lb 1.1 oz (59 kg)  ?04/25/21 145 lb 1 oz (65.8 kg)  ?  ? ?GEN: Elderly male, thin, comfortable ?HEENT: Normal ?NECK: No

## 2021-09-05 DIAGNOSIS — R69 Illness, unspecified: Secondary | ICD-10-CM | POA: Diagnosis not present

## 2021-09-05 DIAGNOSIS — K922 Gastrointestinal hemorrhage, unspecified: Secondary | ICD-10-CM | POA: Diagnosis not present

## 2021-09-05 DIAGNOSIS — R55 Syncope and collapse: Secondary | ICD-10-CM | POA: Diagnosis not present

## 2021-09-05 DIAGNOSIS — Z9181 History of falling: Secondary | ICD-10-CM | POA: Diagnosis not present

## 2021-09-05 DIAGNOSIS — G319 Degenerative disease of nervous system, unspecified: Secondary | ICD-10-CM | POA: Diagnosis not present

## 2021-09-05 DIAGNOSIS — E538 Deficiency of other specified B group vitamins: Secondary | ICD-10-CM | POA: Diagnosis not present

## 2021-09-05 DIAGNOSIS — D649 Anemia, unspecified: Secondary | ICD-10-CM | POA: Diagnosis not present

## 2021-09-05 DIAGNOSIS — M810 Age-related osteoporosis without current pathological fracture: Secondary | ICD-10-CM | POA: Diagnosis not present

## 2021-09-05 DIAGNOSIS — N39490 Overflow incontinence: Secondary | ICD-10-CM | POA: Diagnosis not present

## 2021-09-08 DIAGNOSIS — E538 Deficiency of other specified B group vitamins: Secondary | ICD-10-CM | POA: Diagnosis not present

## 2021-09-08 DIAGNOSIS — M810 Age-related osteoporosis without current pathological fracture: Secondary | ICD-10-CM | POA: Diagnosis not present

## 2021-09-08 DIAGNOSIS — D649 Anemia, unspecified: Secondary | ICD-10-CM | POA: Diagnosis not present

## 2021-09-08 DIAGNOSIS — R69 Illness, unspecified: Secondary | ICD-10-CM | POA: Diagnosis not present

## 2021-09-08 DIAGNOSIS — R55 Syncope and collapse: Secondary | ICD-10-CM | POA: Diagnosis not present

## 2021-09-08 DIAGNOSIS — N39490 Overflow incontinence: Secondary | ICD-10-CM | POA: Diagnosis not present

## 2021-09-08 DIAGNOSIS — Z9181 History of falling: Secondary | ICD-10-CM | POA: Diagnosis not present

## 2021-09-08 DIAGNOSIS — K922 Gastrointestinal hemorrhage, unspecified: Secondary | ICD-10-CM | POA: Diagnosis not present

## 2021-09-08 DIAGNOSIS — G319 Degenerative disease of nervous system, unspecified: Secondary | ICD-10-CM | POA: Diagnosis not present

## 2021-09-11 DIAGNOSIS — R42 Dizziness and giddiness: Secondary | ICD-10-CM | POA: Diagnosis not present

## 2021-09-11 DIAGNOSIS — D500 Iron deficiency anemia secondary to blood loss (chronic): Secondary | ICD-10-CM | POA: Diagnosis not present

## 2021-09-11 DIAGNOSIS — K922 Gastrointestinal hemorrhage, unspecified: Secondary | ICD-10-CM | POA: Diagnosis not present

## 2021-09-11 DIAGNOSIS — G301 Alzheimer's disease with late onset: Secondary | ICD-10-CM | POA: Diagnosis not present

## 2021-09-11 DIAGNOSIS — N39490 Overflow incontinence: Secondary | ICD-10-CM | POA: Diagnosis not present

## 2021-09-11 DIAGNOSIS — R55 Syncope and collapse: Secondary | ICD-10-CM | POA: Diagnosis not present

## 2021-09-12 DIAGNOSIS — Z9181 History of falling: Secondary | ICD-10-CM | POA: Diagnosis not present

## 2021-09-12 DIAGNOSIS — E538 Deficiency of other specified B group vitamins: Secondary | ICD-10-CM | POA: Diagnosis not present

## 2021-09-12 DIAGNOSIS — M810 Age-related osteoporosis without current pathological fracture: Secondary | ICD-10-CM | POA: Diagnosis not present

## 2021-09-12 DIAGNOSIS — G319 Degenerative disease of nervous system, unspecified: Secondary | ICD-10-CM | POA: Diagnosis not present

## 2021-09-12 DIAGNOSIS — K922 Gastrointestinal hemorrhage, unspecified: Secondary | ICD-10-CM | POA: Diagnosis not present

## 2021-09-12 DIAGNOSIS — R69 Illness, unspecified: Secondary | ICD-10-CM | POA: Diagnosis not present

## 2021-09-12 DIAGNOSIS — D649 Anemia, unspecified: Secondary | ICD-10-CM | POA: Diagnosis not present

## 2021-09-12 DIAGNOSIS — R55 Syncope and collapse: Secondary | ICD-10-CM | POA: Diagnosis not present

## 2021-09-12 DIAGNOSIS — N39490 Overflow incontinence: Secondary | ICD-10-CM | POA: Diagnosis not present

## 2021-09-17 DIAGNOSIS — N400 Enlarged prostate without lower urinary tract symptoms: Secondary | ICD-10-CM | POA: Diagnosis not present

## 2021-09-17 DIAGNOSIS — R69 Illness, unspecified: Secondary | ICD-10-CM | POA: Diagnosis not present

## 2021-09-17 DIAGNOSIS — N3281 Overactive bladder: Secondary | ICD-10-CM | POA: Diagnosis not present

## 2021-09-17 DIAGNOSIS — R55 Syncope and collapse: Secondary | ICD-10-CM | POA: Diagnosis not present

## 2021-09-23 ENCOUNTER — Encounter: Payer: Self-pay | Admitting: Internal Medicine

## 2021-09-23 ENCOUNTER — Ambulatory Visit: Payer: Medicare HMO | Admitting: Internal Medicine

## 2021-09-23 VITALS — BP 90/60 | HR 56 | Ht 70.0 in | Wt 133.0 lb

## 2021-09-23 DIAGNOSIS — D509 Iron deficiency anemia, unspecified: Secondary | ICD-10-CM

## 2021-09-23 DIAGNOSIS — R195 Other fecal abnormalities: Secondary | ICD-10-CM | POA: Diagnosis not present

## 2021-09-23 DIAGNOSIS — E538 Deficiency of other specified B group vitamins: Secondary | ICD-10-CM | POA: Diagnosis not present

## 2021-09-23 NOTE — Progress Notes (Signed)
? ?Kevin Page 88 y.o. 1931/04/03 [id number] ? ?Assessment & Plan:  ? ?Encounter Diagnoses  ?Name Primary?  ? Heme positive stool Yes  ? B12 deficiency   ? Iron deficiency anemia - probably blood loss   ? ? ?It sounds like he did have an upper GI bleed in March.  He has significant comorbidities and he is improving and would continue B12 and iron supplementation.  I recommended that he have laboratory follow-up with Dr. Brigitte Pulse in early July regarding iron studies and CBC.  Consider checking B12 then as well. ? ? ?If there are recurrent issues we could consider an endoscopic evaluation but age and comorbidities and patient/wife preference have led to a conservative course decision. ? ?CC: Ginger Organ., MD ? ? ?Subjective:  ? ?Chief Complaint: GI bleeding? ? ?HPI ?88 year old white man with a history of dementia, SVT, hypertension who was seen by my colleagues when he was admitted March 2023 with syncope hypotension hemoglobin 9.1 BUN 41 negative fecal occult blood in the ER but a repeat occult blood was positive.  Wife says stools were dark.  There was consideration for possible upper GI bleeding, aspirin was discontinued PPI therapy prescribed. ? ?Hematochezia September 2022 hemoglobin 11-12 range managed conservatively ?Saw Dr. Johney Frame of cardiology 09/04/2021, aspirin has been stopped before and she agrees with holding that. ? ?Repeat hemoglobin primary care office Dr. Brigitte Pulse 09/12/2021 11.9.  Platelets 272 white count 6.38 ferritin 17 iron saturation 18% TIBC 286 started on Ferrex iron supplement B12 was started in the hospital.  Stools have been a bit darker since starting iron therapy. ? ?Hemoglobin 08/26/2021 was 10.7 MCV has been normal throughout ? ? ?Appetite good has dementia and requires help from wife but tolerating life well otherwise. ? ?  Latest Ref Rng & Units 08/06/2021  ?  2:32 AM 08/04/2021  ?  3:25 AM 08/03/2021  ? 12:35 AM  ?CBC  ?WBC 4.0 - 10.5 K/uL 6.8      ?Hemoglobin 13.0 - 17.0 g/dL 9.4    9.2   9.1    ?Hematocrit 39.0 - 52.0 % 28.7   28.2   26.3    ?Platelets 150 - 400 K/uL 263      ? ?Lab Results  ?Component Value Date  ? [id number] 214 07/31/2021  ? ?Lab Results  ?Component Value Date  ? IRON 68 07/31/2021  ? TIBC 232 (L) 07/31/2021  ? FERRITIN 33 07/31/2021  ? ? ?No Known Allergies ?Current Meds  ?Medication Sig  ? cholecalciferol (VITAMIN D3) 25 MCG (1000 UNIT) tablet Take 1,000 Units by mouth daily.  ? diclofenac Sodium (VOLTAREN) 1 % GEL 2 g 4 (four) times daily as needed (pain).  ? donepezil (ARICEPT) 10 MG tablet Take 10 mg by mouth at bedtime.  ? dutasteride (AVODART) 0.5 MG capsule Take 0.5 mg by mouth at bedtime.  ? guaiFENesin (MUCINEX) 600 MG 12 hr tablet Take 1 tablet (600 mg total) by mouth 2 (two) times daily.  ? hydrocortisone cream 1 % Apply topically 3 (three) times daily.  ? MYRBETRIQ 25 MG TB24 tablet Take 25 mg by mouth daily.  ? OVER THE COUNTER MEDICATION Take 2 tablets by mouth daily. Eye supplement from Dr.Digby office ( Macular protection complete)  ? pantoprazole (PROTONIX) 40 MG tablet Take 1 tablet (40 mg total) by mouth daily.  ? vitamin B-12 1000 MCG tablet Take 1 tablet (1,000 mcg total) by mouth daily.  ? ?Past Medical History:  ?Diagnosis Date  ?  Allergy   ? dust and mites  ? BPH (benign prostatic hyperplasia)   ? Essential hypertension   ? Hyperlipidemia   ? Macular degeneration   ? Osteoarthritis of both knees   ? Sinus bradycardia   ? SVT (supraventricular tachycardia) ([place])   ? a. Event monitor 2014: few episodes of SVT, longest 12 beats with rate 128.  ? Syncope   ? ?Past Surgical History:  ?Procedure Laterality Date  ? CATARACT EXTRACTION W/ INTRAOCULAR LENS  IMPLANT, BILATERAL Bilateral 05/18/2009  ? dupytron contractions  1998 , 2006  ? both hands  ? gi bleeding    ? LUMBAR FUSION  05/18/1992  ? ?Social History  ? ?Social History Narrative  ? Not on file  ? ?family history includes Cancer in his brother. ? ? ?Review of Systems ?See HPI ? ?Objective:  ?  Physical Exam ?BP 90/60   Pulse (!) 56   Ht '5\' 10"'$  (1.778 m)   Wt 133 lb (60.3 kg)   BMI 19.08 kg/m?  ? ? ? ?

## 2021-09-23 NOTE — Patient Instructions (Signed)
Please get your blood count and iron checked in early July with your primary care office. ? ?Due to recent changes in healthcare laws, you may see the results of your imaging and laboratory studies on MyChart before your provider has had a chance to review them.  We understand that in some cases there may be results that are confusing or concerning to you. Not all laboratory results come back in the same time frame and the provider may be waiting for multiple results in order to interpret others.  Please give [place] 48 hours in order for your provider to thoroughly review all the results before contacting the office for clarification of your results.  ? ? ?I appreciate the opportunity to care for you. ?Silvano Rusk, MD, [facility] ?

## 2021-09-25 DIAGNOSIS — D649 Anemia, unspecified: Secondary | ICD-10-CM | POA: Diagnosis not present

## 2021-09-25 DIAGNOSIS — K922 Gastrointestinal hemorrhage, unspecified: Secondary | ICD-10-CM | POA: Diagnosis not present

## 2021-09-25 DIAGNOSIS — R69 Illness, unspecified: Secondary | ICD-10-CM | POA: Diagnosis not present

## 2021-09-25 DIAGNOSIS — E538 Deficiency of other specified B group vitamins: Secondary | ICD-10-CM | POA: Diagnosis not present

## 2021-09-25 DIAGNOSIS — G319 Degenerative disease of nervous system, unspecified: Secondary | ICD-10-CM | POA: Diagnosis not present

## 2021-09-25 DIAGNOSIS — Z9181 History of falling: Secondary | ICD-10-CM | POA: Diagnosis not present

## 2021-09-25 DIAGNOSIS — N39490 Overflow incontinence: Secondary | ICD-10-CM | POA: Diagnosis not present

## 2021-09-25 DIAGNOSIS — R55 Syncope and collapse: Secondary | ICD-10-CM | POA: Diagnosis not present

## 2021-09-25 DIAGNOSIS — M810 Age-related osteoporosis without current pathological fracture: Secondary | ICD-10-CM | POA: Diagnosis not present

## 2021-10-02 DIAGNOSIS — E538 Deficiency of other specified B group vitamins: Secondary | ICD-10-CM | POA: Diagnosis not present

## 2021-10-02 DIAGNOSIS — G319 Degenerative disease of nervous system, unspecified: Secondary | ICD-10-CM | POA: Diagnosis not present

## 2021-10-02 DIAGNOSIS — R69 Illness, unspecified: Secondary | ICD-10-CM | POA: Diagnosis not present

## 2021-10-02 DIAGNOSIS — D649 Anemia, unspecified: Secondary | ICD-10-CM | POA: Diagnosis not present

## 2021-10-02 DIAGNOSIS — N39490 Overflow incontinence: Secondary | ICD-10-CM | POA: Diagnosis not present

## 2021-10-02 DIAGNOSIS — Z9181 History of falling: Secondary | ICD-10-CM | POA: Diagnosis not present

## 2021-10-02 DIAGNOSIS — R55 Syncope and collapse: Secondary | ICD-10-CM | POA: Diagnosis not present

## 2021-10-02 DIAGNOSIS — M810 Age-related osteoporosis without current pathological fracture: Secondary | ICD-10-CM | POA: Diagnosis not present

## 2021-10-02 DIAGNOSIS — K922 Gastrointestinal hemorrhage, unspecified: Secondary | ICD-10-CM | POA: Diagnosis not present

## 2021-10-07 ENCOUNTER — Encounter: Payer: Self-pay | Admitting: Podiatry

## 2021-10-07 ENCOUNTER — Ambulatory Visit: Payer: Medicare HMO | Admitting: Podiatry

## 2021-10-07 DIAGNOSIS — M79674 Pain in right toe(s): Secondary | ICD-10-CM | POA: Diagnosis not present

## 2021-10-07 DIAGNOSIS — R9431 Abnormal electrocardiogram [ECG] [EKG]: Secondary | ICD-10-CM | POA: Insufficient documentation

## 2021-10-07 DIAGNOSIS — B351 Tinea unguium: Secondary | ICD-10-CM | POA: Diagnosis not present

## 2021-10-07 DIAGNOSIS — M79675 Pain in left toe(s): Secondary | ICD-10-CM

## 2021-10-07 DIAGNOSIS — G459 Transient cerebral ischemic attack, unspecified: Secondary | ICD-10-CM | POA: Insufficient documentation

## 2021-10-07 DIAGNOSIS — D500 Iron deficiency anemia secondary to blood loss (chronic): Secondary | ICD-10-CM | POA: Insufficient documentation

## 2021-10-07 DIAGNOSIS — R4701 Aphasia: Secondary | ICD-10-CM | POA: Insufficient documentation

## 2021-10-07 DIAGNOSIS — R404 Transient alteration of awareness: Secondary | ICD-10-CM | POA: Insufficient documentation

## 2021-10-13 NOTE — Progress Notes (Signed)
  Subjective:  Patient ID: Kevin Page, male    DOB: 05-Jul-1930,  MRN: [id number]  Kevin Page presents to clinic today for painful elongated mycotic toenails 1-5 bilaterally which are tender when wearing enclosed shoe gear. Pain is relieved with periodic professional debridement.  Patient is accompanied by his wife on today's visit. They voice no new pedal concerns.  PCP is Ginger Organ., MD , and last visit was September 11, 2021.  No Known Allergies  Review of Systems: Negative except as noted in the HPI.  Objective: No changes noted in today's physical examination.  Vascular Examination: CFT <3 seconds b/l LE. Palpable DP pulse(s) b/l LE. Faintly palpable PT pulse(s) b/l LE. Pedal hair absent. No pain with calf compression b/l. Lower extremity skin temperature gradient warm to cool. No edema noted b/l LE. No cyanosis or clubbing noted b/l LE.[name]  Dermatological Examination: Pedal skin thin, shiny and atrophic b/l LE. No open wounds b/l LE. No interdigital macerations noted b/l LE. Toenails 1-5 b/l elongated, discolored, dystrophic, thickened, crumbly with subungual debris and tenderness to dorsal palpation. No hyperkeratotic nor porokeratotic lesions present on today's visit.[name]  Neurological Examination: Protective sensation intact 5/5 intact bilaterally with 10g monofilament b/l. Vibratory sensation intact b/l.  Musculoskeletal Examination: Muscle strength 5/5 to all lower extremity muscle groups bilaterally. HAV with bunion bilaterally and hammertoes 2-5 b/l. Utilizes transport chair for ambulation assistance.  Assessment/Plan: 1. Pain due to onychomycosis of toenails of both feet      -Patient was evaluated and treated. All patient's and/or POA's questions/concerns answered on today's visit. -Patient to continue soft, supportive shoe gear daily. -Mycotic toenails 1-5 bilaterally were debrided in length and girth with sterile nail nippers and dremel without  incident. -Patient/POA to call should there be question/concern in the interim.   Return in about 3 months (around 01/07/2022).  Marzetta Board, DPM

## 2021-11-17 DIAGNOSIS — H02834 Dermatochalasis of left upper eyelid: Secondary | ICD-10-CM | POA: Diagnosis not present

## 2021-11-17 DIAGNOSIS — H353134 Nonexudative age-related macular degeneration, bilateral, advanced atrophic with subfoveal involvement: Secondary | ICD-10-CM | POA: Diagnosis not present

## 2021-11-17 DIAGNOSIS — H02831 Dermatochalasis of right upper eyelid: Secondary | ICD-10-CM | POA: Diagnosis not present

## 2021-11-17 DIAGNOSIS — H35363 Drusen (degenerative) of macula, bilateral: Secondary | ICD-10-CM | POA: Diagnosis not present

## 2021-11-17 DIAGNOSIS — Z961 Presence of intraocular lens: Secondary | ICD-10-CM | POA: Diagnosis not present

## 2021-11-17 DIAGNOSIS — H31003 Unspecified chorioretinal scars, bilateral: Secondary | ICD-10-CM | POA: Diagnosis not present

## 2021-11-17 DIAGNOSIS — H11133 Conjunctival pigmentations, bilateral: Secondary | ICD-10-CM | POA: Diagnosis not present

## 2021-11-17 DIAGNOSIS — H1840 Unspecified corneal degeneration: Secondary | ICD-10-CM | POA: Diagnosis not present

## 2021-11-17 DIAGNOSIS — Z8669 Personal history of other diseases of the nervous system and sense organs: Secondary | ICD-10-CM | POA: Diagnosis not present

## 2021-11-17 DIAGNOSIS — H43813 Vitreous degeneration, bilateral: Secondary | ICD-10-CM | POA: Diagnosis not present

## 2021-11-25 DIAGNOSIS — D500 Iron deficiency anemia secondary to blood loss (chronic): Secondary | ICD-10-CM | POA: Diagnosis not present

## 2021-11-25 DIAGNOSIS — D649 Anemia, unspecified: Secondary | ICD-10-CM | POA: Diagnosis not present

## 2021-11-25 DIAGNOSIS — E785 Hyperlipidemia, unspecified: Secondary | ICD-10-CM | POA: Diagnosis not present

## 2021-11-25 DIAGNOSIS — R7989 Other specified abnormal findings of blood chemistry: Secondary | ICD-10-CM | POA: Diagnosis not present

## 2021-12-02 NOTE — Progress Notes (Unsigned)
Cardiology Office Note:    Date:  12/03/2021   ID:  Kevin Page, DOB 11/24/1930, MRN [id number]  PCP:  Ginger Organ., MD   [facility] Providers Cardiologist:  Freada Bergeron, MD     Referring MD: Ginger Organ., MD   Chief Complaint: follow-up syncope and collapse  History of Present Illness:    Kevin Page is a pleasantly demented 88 y.o. male with a hx of hypertension, hyperlipidemia, BPH, OA, macular degeneration, habitual alcohol intake, former tobacco abuse (72 yrs, quit 1990s), syncope 2014 (event monitor SB 40s-50s, brief SVT), mild AI, and mildly dilated aortic root in 2014 was previously followed by Dr. Meda Coffee who established care with Dr. Johney Frame 09/04/21.  Per record review, he had an episode of syncope in 2014. TTE 12/2012 showed EF 55-60%, grade 1 DD, mild AI, mildly dilated aortic root, mild MR, mildly to mod dilated LA. 48 hr Holter showed SB with longest pause 1.8 seconds, few episodes of SVT, longest run lasting 12 beats with rate 128 bpm. He also had HR in the 40s at that time, consideration of SSS, although no indication for PPM at that time. Underwent GXT that did not show chronotropic incompetence.   Work up for additional episode of syncope 01/2016 revealed 18 mmHg drop in BP in ED with 20 beat increase in HR, suggestive of orthostasis, treated with IVF. Additional 2 syncopal episodes in 2018 after prolonged 8 hour drive, and while sitting in a restaurant. Wife reported no complete LOC but he saw dark circles and "was not present with no recollection of the episodes until the next day."   Seen by Dr. Meda Coffee 09/2019 with report of progression of dementia. Zio did not show any episodes of significant pauses, heart block, and no a fib. No recurrent syncope.  Seen by Dr. Johney Frame on 09/04/21 at which time most history provided by wife. Patient stated he is "going to survive" and he reported no new concerns. Wife reported 2 recent episodes of syncope in  Sept 2022 and March 2023. Both occurred in setting of GIB. He was evaluated in hospital 07/31/21 following 2nd episode. On EMS arrival he was notably orthostatic with low BP. At home BP remains low on average, asa was stopped. Wife reported poor po intake. Barium swallow study revealed a bony protrusion which caused him to have difficulty swallowing liquids. GIB thought to be diverticular  and possible gastric ulcer. No additional testing was ordered and he was advised to follow-up in 3-4 months.  Today, he is here in a wheelchair accompanied by his wife.  History is provided by his wife.  She reports he has been doing well with continued low blood pressure but no falls, presyncope, or syncope. He sits in a chair for the majority of the day. He does not have any specific concerns. She denies that she has concerns about his current physical state.  Reports that his cognitive decline remains an issue and she is concerned that if he deteriorates physically that she will no longer be able to keep him at home.  Past Medical History:  Diagnosis Date   Allergy    dust and mites   BPH (benign prostatic hyperplasia)    Essential hypertension    Hyperlipidemia    Macular degeneration    Osteoarthritis of both knees    Sinus bradycardia    SVT (supraventricular tachycardia) ([place])    a. Event monitor 2014: few episodes of SVT, longest 12 beats with rate  128.   Syncope     Past Surgical History:  Procedure Laterality Date   CATARACT EXTRACTION W/ INTRAOCULAR LENS  IMPLANT, BILATERAL Bilateral 05/18/2009   COLONOSCOPY  2013   dupytron contractions  1998 , 2006   both hands   gi bleeding     LUMBAR FUSION  05/18/1992    Current Medications: Current Meds  Medication Sig   cholecalciferol (VITAMIN D3) 25 MCG (1000 UNIT) tablet Take 1,000 Units by mouth daily.   diclofenac Sodium (VOLTAREN) 1 % GEL 2 g 4 (four) times daily as needed (pain).   donepezil (ARICEPT) 10 MG tablet Take 10 mg by mouth at  bedtime.   dutasteride (AVODART) 0.5 MG capsule Take 0.5 mg by mouth at bedtime.   guaiFENesin (MUCINEX) 600 MG 12 hr tablet Take 1 tablet (600 mg total) by mouth 2 (two) times daily.   hydrocortisone cream 1 % Apply topically 3 (three) times daily.   iron polysaccharides (NIFEREX) 150 MG capsule Take 150 mg by mouth daily.   MYRBETRIQ 25 MG TB24 tablet Take 25 mg by mouth daily.   OVER THE COUNTER MEDICATION Take 2 tablets by mouth daily. Eye supplement from Dr.Digby office ( Macular protection complete)   pantoprazole (PROTONIX) 40 MG tablet Take 1 tablet (40 mg total) by mouth daily.   vitamin B-12 1000 MCG tablet Take 1 tablet (1,000 mcg total) by mouth daily.     Allergies:   Patient has no known allergies.   Social History   Socioeconomic History   Marital status: Married    Spouse name: Not on file   Number of children: Not on file   Years of education: Not on file   Highest education level: Not on file  Occupational History   Not on file  Tobacco Use   Smoking status: Former   Smokeless tobacco: Never   Tobacco comments:    Smoked for 44 years, quit 1990s  Vaping Use   Vaping Use: Never used  Substance and Sexual Activity   Alcohol use: Yes    Alcohol/week: 12.0 standard drinks of alcohol    Types: 12 Standard drinks or equivalent per week    Comment: 1 drink of gin nightly, occasionally more if going out to dinner.   Drug use: No   Sexual activity: Not on file  Other Topics Concern   Not on file  Social History Narrative   Not on file   Social Determinants of Health   Financial Resource Strain: Not on file  Food Insecurity: Not on file  Transportation Needs: Not on file  Physical Activity: Not on file  Stress: Not on file  Social Connections: Not on file     Family History: The patient's family history includes Cancer in his brother. There is no history of Heart attack.  ROS:   Please see the history of present illness.  All other systems reviewed and  are negative.  Labs/Other Studies Reviewed:    The following studies were reviewed today:  Echo 07/31/21  1. Left ventricular ejection fraction, by estimation, is 65 to 70%. The  left ventricle has normal function. The left ventricle has no regional  wall motion abnormalities. Left ventricular diastolic parameters were  normal.   2. Right ventricular systolic function is normal. The right ventricular  size is normal.   3. The mitral valve is normal in structure. No evidence of mitral valve  regurgitation.   4. The aortic valve is tricuspid. Aortic valve regurgitation is mild.  Cardiac monitor 07/21/19  Sinus bradycardia to sinus tachycardia (38-162 BPM, average 59 BPM). 62 short runs of SVT, the longest lasting 14 seconds.   Sinus bradycardia to sinus tachycardia (38-162 BPM, average 59 BPM). 62 short runs of SVT, the longest lasting 14 seconds. No atrial fibrillation, no pauses.  Recent Labs: 07/31/2021: ALT 12 08/04/2021: Magnesium 1.9 08/06/2021: BUN 13; Creatinine, Ser 0.88; Hemoglobin 9.4; Platelets 263; Potassium 4.0; Sodium 139  Recent Lipid Panel No results found for: "CHOL", "TRIG", "HDL", "CHOLHDL", "VLDL", "LDLCALC", "LDLDIRECT"   Risk Assessment/Calculations:       Physical Exam:    VS:  BP 110/62   Pulse 72   Ht 6' (1.829 m)   Wt 133 lb (60.3 kg)   SpO2 95%   BMI 18.04 kg/m     Wt Readings from Last 3 Encounters:  12/03/21 133 lb (60.3 kg)  09/23/21 133 lb (60.3 kg)  09/04/21 133 lb (60.3 kg)     GEN:  Well nourished, well developed in no acute distress HEENT: Normal NECK: No JVD; No carotid bruits CARDIAC: RRR, 2/6 murmur RUSB, rubs, gallops RESPIRATORY:  Clear to auscultation without rales, wheezing or rhonchi  ABDOMEN: Soft, non-tender, non-distended MUSCULOSKELETAL:  No edema; No deformity. 2+ pedal pulses, equal bilaterally SKIN: Warm and dry NEUROLOGIC:  Alert. Able to answer some pointed questions. Not clearly oriented to time, place,  situation PSYCHIATRIC:  Normal affect   EKG:  EKG is not ordered today.    Diagnoses:    1. Syncope and collapse   2. Mild AI (aortic incompetence)   3. Bradycardia   4. Orthostasis    Assessment and Plan:     Syncope and collapse: Wife feels that symptoms were exacerbated by GI bleed. He has had no recent bleeding issues. No recent episodes of presyncope, syncope. She continues to encourage good hydration and nutrition. We discussed the addition of a pedal exercise that he can easily do while seated which may strengthen legs.   Bradycardia: HR is stable today. Wife denies concerns from recent provider visits.   Hypotension: BP is soft today but stable per wife. He is not on any antihypertensives.  Advised her that we could consider use of midodrine in the future if he becomes unsteady on his feet due to hypotension.  Mild AI: Mild aortic valve regurgitation by echo 07/2021 he has a soft murmur at the RUSB on exam. Conservative management recommended by Dr. Johney Frame. His wife is in favor of this approach.      Disposition: 1 year with Dr. Johney Frame  Medication Adjustments/Labs and Tests Ordered: Current medicines are reviewed at length with the patient today.  Concerns regarding medicines are outlined above.  No orders of the defined types were placed in this encounter.  No orders of the defined types were placed in this encounter.   Patient Instructions  Medication Instructions:   Your physician recommends that you continue on your current medications as directed. Please refer to the Current Medication list given to you today.   *If you need a refill on your cardiac medications before your next appointment, please call your pharmacy*   Lab Work:  None ordered.  If you have labs (blood work) drawn today and your tests are completely normal, you will receive your results only by: [place] (if you have MyChart) OR A paper copy in the mail If you have any lab test  that is abnormal or we need to change your treatment, we will call you to  review the results.   Testing/Procedures:  None ordered.   Follow-Up: At [facility], you and your health needs are our priority.  As part of our continuing mission to provide you with exceptional heart care, we have created designated Provider Care Teams.  These Care Teams include your primary Cardiologist (physician) and Advanced Practice Providers (APPs -  Physician Assistants and Nurse Practitioners) who all work together to provide you with the care you need, when you need it.  We recommend signing up for the patient portal called "MyChart".  Sign up information is provided on this After Visit Summary.  MyChart is used to connect with patients for Virtual Visits (Telemedicine).  Patients are able to view lab/test results, encounter notes, upcoming appointments, etc.  Non-urgent messages can be sent to your provider as well.   To learn more about what you can do with MyChart, go to [email or web address].    Your next appointment:   1 year(s)  The format for your next appointment:   In Person  Provider:   Dr. Gwyndolyn Kaufman.  If primary card or EP is not listed click here to update    :1}    Other Instructions  Please find on amazon a pedal exerciser.   Important Information About Sugar         Signed, Emmaline Life, NP  12/03/2021 4:48 PM    [facility]

## 2021-12-03 ENCOUNTER — Encounter: Payer: Self-pay | Admitting: Nurse Practitioner

## 2021-12-03 ENCOUNTER — Ambulatory Visit: Payer: Medicare HMO | Admitting: Nurse Practitioner

## 2021-12-03 VITALS — BP 110/62 | HR 72 | Ht 72.0 in | Wt 133.0 lb

## 2021-12-03 DIAGNOSIS — R55 Syncope and collapse: Secondary | ICD-10-CM | POA: Diagnosis not present

## 2021-12-03 DIAGNOSIS — I351 Nonrheumatic aortic (valve) insufficiency: Secondary | ICD-10-CM

## 2021-12-03 DIAGNOSIS — R001 Bradycardia, unspecified: Secondary | ICD-10-CM | POA: Diagnosis not present

## 2021-12-03 DIAGNOSIS — I951 Orthostatic hypotension: Secondary | ICD-10-CM | POA: Diagnosis not present

## 2021-12-03 NOTE — Patient Instructions (Signed)
Medication Instructions:   Your physician recommends that you continue on your current medications as directed. Please refer to the Current Medication list given to you today.   *If you need a refill on your cardiac medications before your next appointment, please call your pharmacy*   Lab Work:  None ordered.  If you have labs (blood work) drawn today and your tests are completely normal, you will receive your results only by: [place] (if you have MyChart) OR A paper copy in the mail If you have any lab test that is abnormal or we need to change your treatment, we will call you to review the results.   Testing/Procedures:  None ordered.   Follow-Up: At [facility], you and your health needs are our priority.  As part of our continuing mission to provide you with exceptional heart care, we have created designated Provider Care Teams.  These Care Teams include your primary Cardiologist (physician) and Advanced Practice Providers (APPs -  Physician Assistants and Nurse Practitioners) who all work together to provide you with the care you need, when you need it.  We recommend signing up for the patient portal called "MyChart".  Sign up information is provided on this After Visit Summary.  MyChart is used to connect with patients for Virtual Visits (Telemedicine).  Patients are able to view lab/test results, encounter notes, upcoming appointments, etc.  Non-urgent messages can be sent to your provider as well.   To learn more about what you can do with MyChart, go to [email or web address].    Your next appointment:   1 year(s)  The format for your next appointment:   In Person  Provider:   Dr. Gwyndolyn Kaufman.  If primary card or EP is not listed click here to update    :1}    Other Instructions  Please find on amazon a pedal exerciser.   Important Information About Sugar

## 2021-12-08 DIAGNOSIS — Z0100 Encounter for examination of eyes and vision without abnormal findings: Secondary | ICD-10-CM | POA: Diagnosis not present

## 2021-12-15 DIAGNOSIS — M25512 Pain in left shoulder: Secondary | ICD-10-CM | POA: Diagnosis not present

## 2021-12-17 DIAGNOSIS — M545 Low back pain, unspecified: Secondary | ICD-10-CM | POA: Insufficient documentation

## 2021-12-17 DIAGNOSIS — M4854XA Collapsed vertebra, not elsewhere classified, thoracic region, initial encounter for fracture: Secondary | ICD-10-CM | POA: Diagnosis not present

## 2021-12-17 DIAGNOSIS — S2231XA Fracture of one rib, right side, initial encounter for closed fracture: Secondary | ICD-10-CM | POA: Diagnosis not present

## 2022-01-07 DIAGNOSIS — M4854XA Collapsed vertebra, not elsewhere classified, thoracic region, initial encounter for fracture: Secondary | ICD-10-CM | POA: Diagnosis not present

## 2022-01-07 DIAGNOSIS — S22000A Wedge compression fracture of unspecified thoracic vertebra, initial encounter for closed fracture: Secondary | ICD-10-CM | POA: Insufficient documentation

## 2022-01-14 ENCOUNTER — Ambulatory Visit: Payer: Medicare HMO | Admitting: Podiatry

## 2022-01-20 ENCOUNTER — Ambulatory Visit: Payer: Medicare HMO | Admitting: Podiatry

## 2022-01-20 DIAGNOSIS — M79675 Pain in left toe(s): Secondary | ICD-10-CM | POA: Diagnosis not present

## 2022-01-20 DIAGNOSIS — B351 Tinea unguium: Secondary | ICD-10-CM

## 2022-01-20 DIAGNOSIS — M79674 Pain in right toe(s): Secondary | ICD-10-CM

## 2022-01-20 NOTE — Progress Notes (Signed)
   SUBJECTIVE Patient presents to office today complaining of elongated, thickened nails that cause pain while ambulating in shoes.  Patient is unable to trim their own nails. Patient is here for further evaluation and treatment.  Past Medical History:  Diagnosis Date   Allergy    dust and mites   BPH (benign prostatic hyperplasia)    Essential hypertension    Hyperlipidemia    Macular degeneration    Osteoarthritis of both knees    Sinus bradycardia    SVT (supraventricular tachycardia) ([place])    a. Event monitor 2014: few episodes of SVT, longest 12 beats with rate 128.   Syncope     OBJECTIVE General Patient is awake, alert, and oriented x 3 and in no acute distress. Derm Skin is dry and supple bilateral. Negative open lesions or macerations. Remaining integument unremarkable. Nails are tender, long, thickened and dystrophic with subungual debris, consistent with onychomycosis, 1-5 bilateral. No signs of infection noted. Vasc  DP and PT pedal pulses palpable bilaterally. Temperature gradient within normal limits.  Neuro Epicritic and protective threshold sensation grossly intact bilaterally.  Musculoskeletal Exam No symptomatic pedal deformities noted bilateral. Muscular strength within normal limits.  ASSESSMENT 1.  Pain due to onychomycosis of toenails both  PLAN OF CARE 1. Patient evaluated today.  2. Instructed to maintain good pedal hygiene and foot care.  3. Mechanical debridement of nails 1-5 bilaterally performed using a nail nipper. Filed with dremel without incident.  4. Return to clinic in 3 mos.    Edrick Kins, DPM [facility]  Dr. Edrick Kins, DPM    [address], [place] [address]                Office [phone]  Fax [phone]

## 2022-01-28 DIAGNOSIS — D225 Melanocytic nevi of trunk: Secondary | ICD-10-CM | POA: Diagnosis not present

## 2022-01-28 DIAGNOSIS — L821 Other seborrheic keratosis: Secondary | ICD-10-CM | POA: Diagnosis not present

## 2022-01-28 DIAGNOSIS — D1722 Benign lipomatous neoplasm of skin and subcutaneous tissue of left arm: Secondary | ICD-10-CM | POA: Diagnosis not present

## 2022-01-28 DIAGNOSIS — D1801 Hemangioma of skin and subcutaneous tissue: Secondary | ICD-10-CM | POA: Diagnosis not present

## 2022-01-28 DIAGNOSIS — D3612 Benign neoplasm of peripheral nerves and autonomic nervous system, upper limb, including shoulder: Secondary | ICD-10-CM | POA: Diagnosis not present

## 2022-01-28 DIAGNOSIS — L720 Epidermal cyst: Secondary | ICD-10-CM | POA: Diagnosis not present

## 2022-01-28 DIAGNOSIS — Z85828 Personal history of other malignant neoplasm of skin: Secondary | ICD-10-CM | POA: Diagnosis not present

## 2022-01-28 DIAGNOSIS — D485 Neoplasm of uncertain behavior of skin: Secondary | ICD-10-CM | POA: Diagnosis not present

## 2022-01-28 DIAGNOSIS — L570 Actinic keratosis: Secondary | ICD-10-CM | POA: Diagnosis not present

## 2022-01-28 DIAGNOSIS — L814 Other melanin hyperpigmentation: Secondary | ICD-10-CM | POA: Diagnosis not present

## 2022-01-28 DIAGNOSIS — L738 Other specified follicular disorders: Secondary | ICD-10-CM | POA: Diagnosis not present

## 2022-03-26 DIAGNOSIS — D509 Iron deficiency anemia, unspecified: Secondary | ICD-10-CM | POA: Diagnosis not present

## 2022-03-26 DIAGNOSIS — E785 Hyperlipidemia, unspecified: Secondary | ICD-10-CM | POA: Diagnosis not present

## 2022-03-26 DIAGNOSIS — R7989 Other specified abnormal findings of blood chemistry: Secondary | ICD-10-CM | POA: Diagnosis not present

## 2022-03-26 DIAGNOSIS — I10 Essential (primary) hypertension: Secondary | ICD-10-CM | POA: Diagnosis not present

## 2022-03-26 DIAGNOSIS — Z125 Encounter for screening for malignant neoplasm of prostate: Secondary | ICD-10-CM | POA: Diagnosis not present

## 2022-03-26 DIAGNOSIS — D500 Iron deficiency anemia secondary to blood loss (chronic): Secondary | ICD-10-CM | POA: Diagnosis not present

## 2022-03-26 DIAGNOSIS — M810 Age-related osteoporosis without current pathological fracture: Secondary | ICD-10-CM | POA: Diagnosis not present

## 2022-04-02 DIAGNOSIS — R42 Dizziness and giddiness: Secondary | ICD-10-CM | POA: Diagnosis not present

## 2022-04-02 DIAGNOSIS — Z1339 Encounter for screening examination for other mental health and behavioral disorders: Secondary | ICD-10-CM | POA: Diagnosis not present

## 2022-04-02 DIAGNOSIS — E785 Hyperlipidemia, unspecified: Secondary | ICD-10-CM | POA: Diagnosis not present

## 2022-04-02 DIAGNOSIS — J449 Chronic obstructive pulmonary disease, unspecified: Secondary | ICD-10-CM | POA: Diagnosis not present

## 2022-04-02 DIAGNOSIS — R69 Illness, unspecified: Secondary | ICD-10-CM | POA: Diagnosis not present

## 2022-04-02 DIAGNOSIS — I10 Essential (primary) hypertension: Secondary | ICD-10-CM | POA: Diagnosis not present

## 2022-04-02 DIAGNOSIS — Z0000 Encounter for general adult medical examination without abnormal findings: Secondary | ICD-10-CM | POA: Diagnosis not present

## 2022-04-02 DIAGNOSIS — G301 Alzheimer's disease with late onset: Secondary | ICD-10-CM | POA: Diagnosis not present

## 2022-04-02 DIAGNOSIS — R531 Weakness: Secondary | ICD-10-CM | POA: Diagnosis not present

## 2022-04-02 DIAGNOSIS — Z8673 Personal history of transient ischemic attack (TIA), and cerebral infarction without residual deficits: Secondary | ICD-10-CM | POA: Diagnosis not present

## 2022-04-02 DIAGNOSIS — M810 Age-related osteoporosis without current pathological fracture: Secondary | ICD-10-CM | POA: Diagnosis not present

## 2022-04-02 DIAGNOSIS — F0280 Dementia in other diseases classified elsewhere without behavioral disturbance: Secondary | ICD-10-CM | POA: Diagnosis not present

## 2022-04-02 DIAGNOSIS — Z1331 Encounter for screening for depression: Secondary | ICD-10-CM | POA: Diagnosis not present

## 2022-04-29 ENCOUNTER — Ambulatory Visit: Payer: Medicare HMO | Admitting: Podiatry

## 2022-04-29 ENCOUNTER — Encounter: Payer: Self-pay | Admitting: Podiatry

## 2022-04-29 DIAGNOSIS — M79674 Pain in right toe(s): Secondary | ICD-10-CM

## 2022-04-29 DIAGNOSIS — B351 Tinea unguium: Secondary | ICD-10-CM | POA: Diagnosis not present

## 2022-04-29 DIAGNOSIS — M79675 Pain in left toe(s): Secondary | ICD-10-CM

## 2022-04-29 NOTE — Progress Notes (Signed)
  Subjective:  Patient ID: Kevin Page, male    DOB: 1931/03/27,  MRN: [id number]  Kevin Page presents to clinic today for painful thick toenails that are difficult to trim. Pain interferes with ambulation. Aggravating factors include wearing enclosed shoe gear. Pain is relieved with periodic professional debridement.  Chief Complaint  Patient presents with   Nail Problem    Routine foot care, PCP last seen 6 months ago   New problem(s): None.   PCP is Ginger Organ., MD.  No Known Allergies  Review of Systems: Negative except as noted in the HPI.  Objective: No changes noted in today's physical examination. There were no vitals filed for this visit.  Kevin Page is a pleasant 88 y.o. male WD, WN in NAD. AAO x 3.  Vascular Examination: CFT <3 seconds b/l LE. Palpable DP pulse(s) b/l LE. Faintly palpable PT pulse(s) b/l LE. Pedal hair absent. No pain with calf compression b/l. Lower extremity skin temperature gradient warm to cool. No edema noted b/l LE. No cyanosis or clubbing noted b/l LE.Kevin Page  Dermatological Examination: Pedal skin thin, shiny and atrophic b/l LE. No open wounds b/l LE. No interdigital macerations noted b/l LE. Toenails 1-5 b/l elongated, discolored, dystrophic, thickened, crumbly with subungual debris and tenderness to dorsal palpation. No hyperkeratotic nor porokeratotic lesions present on today's visit.Kevin Page  Neurological Examination: Protective sensation intact 5/5 intact bilaterally with 10g monofilament b/l. Vibratory sensation intact b/l.  Musculoskeletal Examination: Muscle strength 5/5 to all lower extremity muscle groups bilaterally. HAV with bunion bilaterally and hammertoes 2-5 b/l. Utilizes transport chair for ambulation assistance.  Assessment/Plan: 1. Pain due to onychomycosis of toenails of both feet     No orders of the defined types were placed in this encounter.   -Consent given for treatment as described below: -Examined  patient. -Continue supportive shoe gear daily. -Toenails 1-5 b/l were debrided in length and girth with sterile nail nippers and dremel without iatrogenic bleeding.  -Patient/POA to call should there be question/concern in the interim.   Return in about 3 months (around 07/29/2022).  Marzetta Board, DPM

## 2022-08-12 ENCOUNTER — Ambulatory Visit: Payer: Medicare HMO | Admitting: Podiatrist

## 2022-08-12 ENCOUNTER — Ambulatory Visit: Payer: Medicare HMO | Admitting: Podiatry

## 2022-08-12 DIAGNOSIS — B351 Tinea unguium: Secondary | ICD-10-CM | POA: Diagnosis not present

## 2022-08-12 DIAGNOSIS — M79674 Pain in right toe(s): Secondary | ICD-10-CM

## 2022-08-12 DIAGNOSIS — M79675 Pain in left toe(s): Secondary | ICD-10-CM

## 2022-08-12 NOTE — Progress Notes (Signed)
  Subjective:  Patient ID: Kevin Page, male    DOB: 05/11/1931,  MRN: [id number]  Kevin Page presents to clinic today for painful thick toenails that are difficult to trim. Pain interferes with ambulation. Aggravating factors include wearing enclosed shoe gear. Pain is relieved with periodic professional debridement.  Chief Complaint  Patient presents with   Nail Problem    RFC PCP-William Shaw PCP VST-12/203    New problem(s): None.   PCP is Ginger Organ., MD.  No Known Allergies  Review of Systems: Negative except as noted in the HPI.  Objective: No changes noted in today's physical examination. There were no vitals filed for this visit.  Kevin Page is a pleasant 89 y.o. male WD, WN in NAD. AAO x 3.  Vascular Examination: CFT <3 seconds b/l LE. Palpable DP pulse(s) b/l LE. Faintly palpable PT pulse(s) b/l LE. Pedal hair absent. No pain with calf compression b/l. Lower extremity skin temperature gradient warm to cool. No edema noted b/l LE. No cyanosis or clubbing noted b/l LE.Kevin Page  Dermatological Examination: Pedal skin thin, shiny and atrophic b/l LE. No open wounds b/l LE. No interdigital macerations noted b/l LE. Toenails 1-5 b/l elongated, discolored, dystrophic, thickened, crumbly with subungual debris and tenderness to dorsal palpation. No hyperkeratotic nor porokeratotic lesions present on today's visit.Kevin Page  Neurological Examination: Protective sensation intact 5/5 intact bilaterally with 10g monofilament b/l. Vibratory sensation intact b/l.  Musculoskeletal Examination: Muscle strength 5/5 to all lower extremity muscle groups bilaterally. HAV with bunion bilaterally and hammertoes 2-5 b/l. Utilizes transport chair for ambulation assistance.  Assessment/Plan:   ICD-10-CM   1. Pain due to onychomycosis of toenails of both feet  B35.1    M79.675    M79.674        -Consent given for treatment as described below: -Examined patient. -Continue supportive shoe  gear daily. -Toenails 1-5 b/l were debrided in length and girth with sterile nail nippers and dremel without iatrogenic bleeding.  -Patient/POA to call should there be question/concern in the interim.   Return in 3 mo  Kevin Page, [place]

## 2022-10-21 ENCOUNTER — Ambulatory Visit: Payer: Medicare HMO | Admitting: Podiatry

## 2022-10-21 ENCOUNTER — Encounter: Payer: Self-pay | Admitting: Podiatry

## 2022-10-21 DIAGNOSIS — M79674 Pain in right toe(s): Secondary | ICD-10-CM | POA: Diagnosis not present

## 2022-10-21 DIAGNOSIS — B351 Tinea unguium: Secondary | ICD-10-CM

## 2022-10-21 DIAGNOSIS — M79675 Pain in left toe(s): Secondary | ICD-10-CM

## 2022-10-21 NOTE — Progress Notes (Signed)
This patient presents to the office with chief complaint of long thick painful nails.  Patient says the nails are painful walking and wearing shoes.  This patient is unable to self treat.  This patient is unable to trim his nails since she is unable to reach his nails.  he presents to the office for preventative foot care services. He presents to the office with his wife.  General Appearance  Alert, conversant and in no acute stress.  Vascular  Dorsalis pedis and posterior tibial  pulses are weakly  palpable  bilaterally.  Capillary return is within normal limits  bilaterally. Temperature is within normal limits  bilaterally.  Neurologic  Senn-Weinstein monofilament wire test within normal limits  bilaterally. Muscle power within normal limits bilaterally.  Nails Thick disfigured discolored nails with subungual debris  from hallux to fifth toes bilaterally. No evidence of bacterial infection or drainage bilaterally.  Orthopedic  No limitations of motion  feet .  No crepitus or effusions noted.  No bony pathology or digital deformities noted.  Skin  normotropic skin with no porokeratosis noted bilaterally.  No signs of infections or ulcers noted.     Onychomycosis  Nails  B/L.  Pain in right toes  Pain in left toes  Debridement of nails both feet followed trimming the nails with dremel tool.    RTC 3 months.   Helane Gunther DPM

## 2022-10-31 IMAGING — DX DG CHEST 1V PORT
1 series · 1 of 1 positions shown · non-contrast
Comparison: Chest x-ray dated January 18, 2016.

CLINICAL DATA: Cough.

EXAM:
PORTABLE CHEST 1 VIEW

[chest ap]
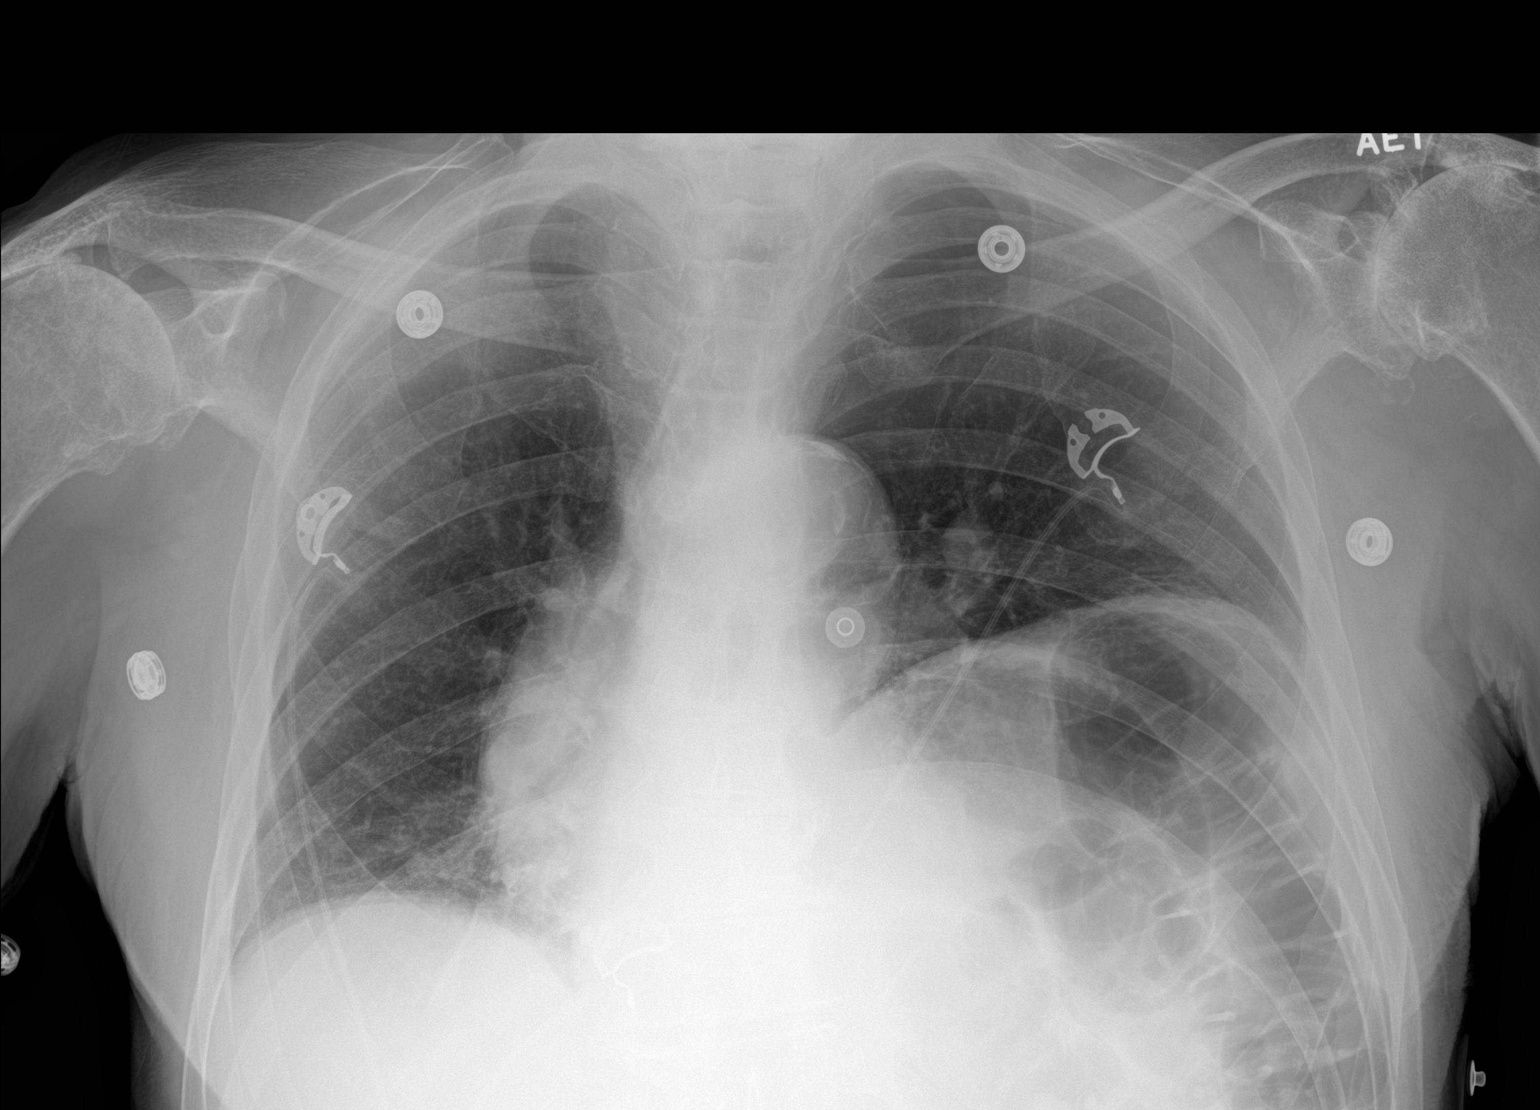

[1 of 1 positions shown; findings below may reference images not displayed]

FINDINGS: Normal heart size. Normal pulmonary vascularity. Unchanged elevation
of the left hemidiaphragm No focal consolidation, pleural effusion,
or pneumothorax. No acute osseous abnormality.
IMPRESSION: 1. No active disease.

## 2022-11-23 DIAGNOSIS — H353134 Nonexudative age-related macular degeneration, bilateral, advanced atrophic with subfoveal involvement: Secondary | ICD-10-CM | POA: Diagnosis not present

## 2022-11-23 DIAGNOSIS — H43813 Vitreous degeneration, bilateral: Secondary | ICD-10-CM | POA: Diagnosis not present

## 2022-12-03 DIAGNOSIS — L821 Other seborrheic keratosis: Secondary | ICD-10-CM | POA: Diagnosis not present

## 2022-12-03 DIAGNOSIS — D2372 Other benign neoplasm of skin of left lower limb, including hip: Secondary | ICD-10-CM | POA: Diagnosis not present

## 2022-12-03 DIAGNOSIS — L720 Epidermal cyst: Secondary | ICD-10-CM | POA: Diagnosis not present

## 2022-12-03 DIAGNOSIS — L738 Other specified follicular disorders: Secondary | ICD-10-CM | POA: Diagnosis not present

## 2022-12-03 DIAGNOSIS — Z85828 Personal history of other malignant neoplasm of skin: Secondary | ICD-10-CM | POA: Diagnosis not present

## 2022-12-03 DIAGNOSIS — L309 Dermatitis, unspecified: Secondary | ICD-10-CM | POA: Diagnosis not present

## 2022-12-03 DIAGNOSIS — L814 Other melanin hyperpigmentation: Secondary | ICD-10-CM | POA: Diagnosis not present

## 2022-12-03 DIAGNOSIS — D3612 Benign neoplasm of peripheral nerves and autonomic nervous system, upper limb, including shoulder: Secondary | ICD-10-CM | POA: Diagnosis not present

## 2022-12-30 ENCOUNTER — Encounter: Payer: Self-pay | Admitting: Podiatry

## 2022-12-30 ENCOUNTER — Ambulatory Visit: Payer: Medicare HMO | Admitting: Podiatry

## 2022-12-30 DIAGNOSIS — M79675 Pain in left toe(s): Secondary | ICD-10-CM | POA: Diagnosis not present

## 2022-12-30 DIAGNOSIS — B351 Tinea unguium: Secondary | ICD-10-CM | POA: Diagnosis not present

## 2022-12-30 DIAGNOSIS — M79674 Pain in right toe(s): Secondary | ICD-10-CM | POA: Diagnosis not present

## 2023-01-04 NOTE — Progress Notes (Signed)
  Subjective:  Patient ID: Kevin Page, male    DOB: 03-14-1931,  MRN: [id number]  Kevin Page presents to clinic today for: painful elongated mycotic toenails 1-5 bilaterally which are tender when wearing enclosed shoe gear. Pain is relieved with periodic professional debridement.   PCP is Cleatis Polka., MD.  No Known Allergies  Review of Systems: Negative except as noted in the HPI.  Objective: No changes noted in today's physical examination. There were no vitals filed for this visit.  Kevin Page is a pleasant 89 y.o. male in NAD. AAO x 3.  Vascular Examination: Capillary refill time <3 seconds b/l LE. Palpable DPl pulses b/l LE; faintly palpable PT pulses b/l. Digital hair absent b/l. No pedal edema b/l. Skin temperature gradient warm to cool b/l. No varicosities b/l. No cyanosis or clubbing noted b/l LE.[name]  Dermatological Examination: Pedal skin thin, shiny and atrophic b/l LE. No open wounds b/l LE. No interdigital macerations noted b/l LE. Toenails 1-5 b/l elongated, discolored, dystrophic, thickened, crumbly with subungual debris and tenderness to dorsal palpation..  Neurological Examination: Protective sensation intact with 10 gram monofilament b/l LE. Vibratory sensation intact b/l LE.   Musculoskeletal Examination: Muscle strength 5/5 to all lower extremity muscle groups bilaterally. HAV with bunion deformity noted b/l LE. Hammertoe deformity noted 2-5 b/l. Utilizes transport chair for ambulation assistance.  Assessment/Plan: 1. Pain due to onychomycosis of toenails of both feet     -Patient was evaluated and treated. All patient's and/or POA's questions/concerns answered on today's visit. -Patient to continue soft, supportive shoe gear daily. -Mycotic toenails 1-5 bilaterally were debrided in length and girth with sterile nail nippers and dremel without incident. -Patient/POA to call should there be question/concern in the interim.   Return in about 3 months  (around 04/01/2023).  Freddie Breech, DPM

## 2023-01-25 DIAGNOSIS — G301 Alzheimer's disease with late onset: Secondary | ICD-10-CM | POA: Diagnosis not present

## 2023-01-25 DIAGNOSIS — N39490 Overflow incontinence: Secondary | ICD-10-CM | POA: Diagnosis not present

## 2023-01-25 DIAGNOSIS — R531 Weakness: Secondary | ICD-10-CM | POA: Diagnosis not present

## 2023-01-25 DIAGNOSIS — F0280 Dementia in other diseases classified elsewhere without behavioral disturbance: Secondary | ICD-10-CM | POA: Diagnosis not present

## 2023-01-25 DIAGNOSIS — R42 Dizziness and giddiness: Secondary | ICD-10-CM | POA: Diagnosis not present

## 2023-01-25 DIAGNOSIS — M810 Age-related osteoporosis without current pathological fracture: Secondary | ICD-10-CM | POA: Diagnosis not present

## 2023-01-25 DIAGNOSIS — N401 Enlarged prostate with lower urinary tract symptoms: Secondary | ICD-10-CM | POA: Diagnosis not present

## 2023-01-27 DIAGNOSIS — M810 Age-related osteoporosis without current pathological fracture: Secondary | ICD-10-CM | POA: Diagnosis not present

## 2023-01-27 DIAGNOSIS — R82998 Other abnormal findings in urine: Secondary | ICD-10-CM | POA: Diagnosis not present

## 2023-01-27 DIAGNOSIS — N39490 Overflow incontinence: Secondary | ICD-10-CM | POA: Diagnosis not present

## 2023-01-27 DIAGNOSIS — Z8673 Personal history of transient ischemic attack (TIA), and cerebral infarction without residual deficits: Secondary | ICD-10-CM | POA: Diagnosis not present

## 2023-01-27 DIAGNOSIS — G301 Alzheimer's disease with late onset: Secondary | ICD-10-CM | POA: Diagnosis not present

## 2023-01-27 DIAGNOSIS — I10 Essential (primary) hypertension: Secondary | ICD-10-CM | POA: Diagnosis not present

## 2023-01-27 DIAGNOSIS — M170 Bilateral primary osteoarthritis of knee: Secondary | ICD-10-CM | POA: Diagnosis not present

## 2023-01-27 DIAGNOSIS — J449 Chronic obstructive pulmonary disease, unspecified: Secondary | ICD-10-CM | POA: Diagnosis not present

## 2023-01-27 DIAGNOSIS — H3530 Unspecified macular degeneration: Secondary | ICD-10-CM | POA: Diagnosis not present

## 2023-01-27 DIAGNOSIS — F0280 Dementia in other diseases classified elsewhere without behavioral disturbance: Secondary | ICD-10-CM | POA: Diagnosis not present

## 2023-01-27 DIAGNOSIS — N401 Enlarged prostate with lower urinary tract symptoms: Secondary | ICD-10-CM | POA: Diagnosis not present

## 2023-01-27 DIAGNOSIS — M4326 Fusion of spine, lumbar region: Secondary | ICD-10-CM | POA: Diagnosis not present

## 2023-01-27 DIAGNOSIS — Z87891 Personal history of nicotine dependence: Secondary | ICD-10-CM | POA: Diagnosis not present

## 2023-01-29 DIAGNOSIS — Z8673 Personal history of transient ischemic attack (TIA), and cerebral infarction without residual deficits: Secondary | ICD-10-CM | POA: Diagnosis not present

## 2023-01-29 DIAGNOSIS — F0280 Dementia in other diseases classified elsewhere without behavioral disturbance: Secondary | ICD-10-CM | POA: Diagnosis not present

## 2023-01-29 DIAGNOSIS — N401 Enlarged prostate with lower urinary tract symptoms: Secondary | ICD-10-CM | POA: Diagnosis not present

## 2023-01-29 DIAGNOSIS — J449 Chronic obstructive pulmonary disease, unspecified: Secondary | ICD-10-CM | POA: Diagnosis not present

## 2023-01-29 DIAGNOSIS — N39490 Overflow incontinence: Secondary | ICD-10-CM | POA: Diagnosis not present

## 2023-01-29 DIAGNOSIS — I10 Essential (primary) hypertension: Secondary | ICD-10-CM | POA: Diagnosis not present

## 2023-01-29 DIAGNOSIS — M810 Age-related osteoporosis without current pathological fracture: Secondary | ICD-10-CM | POA: Diagnosis not present

## 2023-01-29 DIAGNOSIS — Z87891 Personal history of nicotine dependence: Secondary | ICD-10-CM | POA: Diagnosis not present

## 2023-01-29 DIAGNOSIS — G301 Alzheimer's disease with late onset: Secondary | ICD-10-CM | POA: Diagnosis not present

## 2023-01-29 DIAGNOSIS — M4326 Fusion of spine, lumbar region: Secondary | ICD-10-CM | POA: Diagnosis not present

## 2023-01-29 DIAGNOSIS — M170 Bilateral primary osteoarthritis of knee: Secondary | ICD-10-CM | POA: Diagnosis not present

## 2023-01-29 DIAGNOSIS — H3530 Unspecified macular degeneration: Secondary | ICD-10-CM | POA: Diagnosis not present

## 2023-02-01 DIAGNOSIS — M170 Bilateral primary osteoarthritis of knee: Secondary | ICD-10-CM | POA: Diagnosis not present

## 2023-02-01 DIAGNOSIS — N39490 Overflow incontinence: Secondary | ICD-10-CM | POA: Diagnosis not present

## 2023-02-01 DIAGNOSIS — M810 Age-related osteoporosis without current pathological fracture: Secondary | ICD-10-CM | POA: Diagnosis not present

## 2023-02-01 DIAGNOSIS — N401 Enlarged prostate with lower urinary tract symptoms: Secondary | ICD-10-CM | POA: Diagnosis not present

## 2023-02-01 DIAGNOSIS — F0280 Dementia in other diseases classified elsewhere without behavioral disturbance: Secondary | ICD-10-CM | POA: Diagnosis not present

## 2023-02-01 DIAGNOSIS — G301 Alzheimer's disease with late onset: Secondary | ICD-10-CM | POA: Diagnosis not present

## 2023-02-01 DIAGNOSIS — J449 Chronic obstructive pulmonary disease, unspecified: Secondary | ICD-10-CM | POA: Diagnosis not present

## 2023-02-01 DIAGNOSIS — I10 Essential (primary) hypertension: Secondary | ICD-10-CM | POA: Diagnosis not present

## 2023-02-01 DIAGNOSIS — Z87891 Personal history of nicotine dependence: Secondary | ICD-10-CM | POA: Diagnosis not present

## 2023-02-01 DIAGNOSIS — M4326 Fusion of spine, lumbar region: Secondary | ICD-10-CM | POA: Diagnosis not present

## 2023-02-01 DIAGNOSIS — H3530 Unspecified macular degeneration: Secondary | ICD-10-CM | POA: Diagnosis not present

## 2023-02-01 DIAGNOSIS — Z8673 Personal history of transient ischemic attack (TIA), and cerebral infarction without residual deficits: Secondary | ICD-10-CM | POA: Diagnosis not present

## 2023-02-04 DIAGNOSIS — Z8673 Personal history of transient ischemic attack (TIA), and cerebral infarction without residual deficits: Secondary | ICD-10-CM | POA: Diagnosis not present

## 2023-02-04 DIAGNOSIS — J449 Chronic obstructive pulmonary disease, unspecified: Secondary | ICD-10-CM | POA: Diagnosis not present

## 2023-02-04 DIAGNOSIS — M810 Age-related osteoporosis without current pathological fracture: Secondary | ICD-10-CM | POA: Diagnosis not present

## 2023-02-04 DIAGNOSIS — I10 Essential (primary) hypertension: Secondary | ICD-10-CM | POA: Diagnosis not present

## 2023-02-04 DIAGNOSIS — N401 Enlarged prostate with lower urinary tract symptoms: Secondary | ICD-10-CM | POA: Diagnosis not present

## 2023-02-04 DIAGNOSIS — M170 Bilateral primary osteoarthritis of knee: Secondary | ICD-10-CM | POA: Diagnosis not present

## 2023-02-04 DIAGNOSIS — F0280 Dementia in other diseases classified elsewhere without behavioral disturbance: Secondary | ICD-10-CM | POA: Diagnosis not present

## 2023-02-04 DIAGNOSIS — G301 Alzheimer's disease with late onset: Secondary | ICD-10-CM | POA: Diagnosis not present

## 2023-02-04 DIAGNOSIS — H3530 Unspecified macular degeneration: Secondary | ICD-10-CM | POA: Diagnosis not present

## 2023-02-04 DIAGNOSIS — N39490 Overflow incontinence: Secondary | ICD-10-CM | POA: Diagnosis not present

## 2023-02-04 DIAGNOSIS — M4326 Fusion of spine, lumbar region: Secondary | ICD-10-CM | POA: Diagnosis not present

## 2023-02-04 DIAGNOSIS — Z87891 Personal history of nicotine dependence: Secondary | ICD-10-CM | POA: Diagnosis not present

## 2023-02-05 DIAGNOSIS — I10 Essential (primary) hypertension: Secondary | ICD-10-CM | POA: Diagnosis not present

## 2023-02-05 DIAGNOSIS — F0280 Dementia in other diseases classified elsewhere without behavioral disturbance: Secondary | ICD-10-CM | POA: Diagnosis not present

## 2023-02-05 DIAGNOSIS — H3530 Unspecified macular degeneration: Secondary | ICD-10-CM | POA: Diagnosis not present

## 2023-02-05 DIAGNOSIS — M170 Bilateral primary osteoarthritis of knee: Secondary | ICD-10-CM | POA: Diagnosis not present

## 2023-02-05 DIAGNOSIS — M810 Age-related osteoporosis without current pathological fracture: Secondary | ICD-10-CM | POA: Diagnosis not present

## 2023-02-05 DIAGNOSIS — J449 Chronic obstructive pulmonary disease, unspecified: Secondary | ICD-10-CM | POA: Diagnosis not present

## 2023-02-05 DIAGNOSIS — Z8673 Personal history of transient ischemic attack (TIA), and cerebral infarction without residual deficits: Secondary | ICD-10-CM | POA: Diagnosis not present

## 2023-02-05 DIAGNOSIS — G301 Alzheimer's disease with late onset: Secondary | ICD-10-CM | POA: Diagnosis not present

## 2023-02-05 DIAGNOSIS — N39490 Overflow incontinence: Secondary | ICD-10-CM | POA: Diagnosis not present

## 2023-02-05 DIAGNOSIS — M4326 Fusion of spine, lumbar region: Secondary | ICD-10-CM | POA: Diagnosis not present

## 2023-02-05 DIAGNOSIS — Z87891 Personal history of nicotine dependence: Secondary | ICD-10-CM | POA: Diagnosis not present

## 2023-02-05 DIAGNOSIS — N401 Enlarged prostate with lower urinary tract symptoms: Secondary | ICD-10-CM | POA: Diagnosis not present

## 2023-02-08 DIAGNOSIS — M4326 Fusion of spine, lumbar region: Secondary | ICD-10-CM | POA: Diagnosis not present

## 2023-02-08 DIAGNOSIS — J449 Chronic obstructive pulmonary disease, unspecified: Secondary | ICD-10-CM | POA: Diagnosis not present

## 2023-02-08 DIAGNOSIS — H3530 Unspecified macular degeneration: Secondary | ICD-10-CM | POA: Diagnosis not present

## 2023-02-08 DIAGNOSIS — M810 Age-related osteoporosis without current pathological fracture: Secondary | ICD-10-CM | POA: Diagnosis not present

## 2023-02-08 DIAGNOSIS — N39490 Overflow incontinence: Secondary | ICD-10-CM | POA: Diagnosis not present

## 2023-02-08 DIAGNOSIS — Z8673 Personal history of transient ischemic attack (TIA), and cerebral infarction without residual deficits: Secondary | ICD-10-CM | POA: Diagnosis not present

## 2023-02-08 DIAGNOSIS — I10 Essential (primary) hypertension: Secondary | ICD-10-CM | POA: Diagnosis not present

## 2023-02-08 DIAGNOSIS — M170 Bilateral primary osteoarthritis of knee: Secondary | ICD-10-CM | POA: Diagnosis not present

## 2023-02-08 DIAGNOSIS — F0280 Dementia in other diseases classified elsewhere without behavioral disturbance: Secondary | ICD-10-CM | POA: Diagnosis not present

## 2023-02-08 DIAGNOSIS — N401 Enlarged prostate with lower urinary tract symptoms: Secondary | ICD-10-CM | POA: Diagnosis not present

## 2023-02-08 DIAGNOSIS — Z87891 Personal history of nicotine dependence: Secondary | ICD-10-CM | POA: Diagnosis not present

## 2023-02-08 DIAGNOSIS — G301 Alzheimer's disease with late onset: Secondary | ICD-10-CM | POA: Diagnosis not present

## 2023-02-09 DIAGNOSIS — F0280 Dementia in other diseases classified elsewhere without behavioral disturbance: Secondary | ICD-10-CM | POA: Diagnosis not present

## 2023-02-09 DIAGNOSIS — Z8673 Personal history of transient ischemic attack (TIA), and cerebral infarction without residual deficits: Secondary | ICD-10-CM | POA: Diagnosis not present

## 2023-02-09 DIAGNOSIS — M170 Bilateral primary osteoarthritis of knee: Secondary | ICD-10-CM | POA: Diagnosis not present

## 2023-02-09 DIAGNOSIS — N401 Enlarged prostate with lower urinary tract symptoms: Secondary | ICD-10-CM | POA: Diagnosis not present

## 2023-02-09 DIAGNOSIS — Z87891 Personal history of nicotine dependence: Secondary | ICD-10-CM | POA: Diagnosis not present

## 2023-02-09 DIAGNOSIS — M4326 Fusion of spine, lumbar region: Secondary | ICD-10-CM | POA: Diagnosis not present

## 2023-02-09 DIAGNOSIS — N39490 Overflow incontinence: Secondary | ICD-10-CM | POA: Diagnosis not present

## 2023-02-09 DIAGNOSIS — I10 Essential (primary) hypertension: Secondary | ICD-10-CM | POA: Diagnosis not present

## 2023-02-09 DIAGNOSIS — M810 Age-related osteoporosis without current pathological fracture: Secondary | ICD-10-CM | POA: Diagnosis not present

## 2023-02-09 DIAGNOSIS — G301 Alzheimer's disease with late onset: Secondary | ICD-10-CM | POA: Diagnosis not present

## 2023-02-09 DIAGNOSIS — H3530 Unspecified macular degeneration: Secondary | ICD-10-CM | POA: Diagnosis not present

## 2023-02-09 DIAGNOSIS — J449 Chronic obstructive pulmonary disease, unspecified: Secondary | ICD-10-CM | POA: Diagnosis not present

## 2023-02-10 DIAGNOSIS — Z8673 Personal history of transient ischemic attack (TIA), and cerebral infarction without residual deficits: Secondary | ICD-10-CM | POA: Diagnosis not present

## 2023-02-10 DIAGNOSIS — I10 Essential (primary) hypertension: Secondary | ICD-10-CM | POA: Diagnosis not present

## 2023-02-10 DIAGNOSIS — F0280 Dementia in other diseases classified elsewhere without behavioral disturbance: Secondary | ICD-10-CM | POA: Diagnosis not present

## 2023-02-10 DIAGNOSIS — N401 Enlarged prostate with lower urinary tract symptoms: Secondary | ICD-10-CM | POA: Diagnosis not present

## 2023-02-10 DIAGNOSIS — Z87891 Personal history of nicotine dependence: Secondary | ICD-10-CM | POA: Diagnosis not present

## 2023-02-10 DIAGNOSIS — M170 Bilateral primary osteoarthritis of knee: Secondary | ICD-10-CM | POA: Diagnosis not present

## 2023-02-10 DIAGNOSIS — M4326 Fusion of spine, lumbar region: Secondary | ICD-10-CM | POA: Diagnosis not present

## 2023-02-10 DIAGNOSIS — J449 Chronic obstructive pulmonary disease, unspecified: Secondary | ICD-10-CM | POA: Diagnosis not present

## 2023-02-10 DIAGNOSIS — G301 Alzheimer's disease with late onset: Secondary | ICD-10-CM | POA: Diagnosis not present

## 2023-02-10 DIAGNOSIS — M810 Age-related osteoporosis without current pathological fracture: Secondary | ICD-10-CM | POA: Diagnosis not present

## 2023-02-10 DIAGNOSIS — N39490 Overflow incontinence: Secondary | ICD-10-CM | POA: Diagnosis not present

## 2023-02-10 DIAGNOSIS — H3530 Unspecified macular degeneration: Secondary | ICD-10-CM | POA: Diagnosis not present

## 2023-02-12 DIAGNOSIS — N39490 Overflow incontinence: Secondary | ICD-10-CM | POA: Diagnosis not present

## 2023-02-12 DIAGNOSIS — M170 Bilateral primary osteoarthritis of knee: Secondary | ICD-10-CM | POA: Diagnosis not present

## 2023-02-12 DIAGNOSIS — N401 Enlarged prostate with lower urinary tract symptoms: Secondary | ICD-10-CM | POA: Diagnosis not present

## 2023-02-12 DIAGNOSIS — I10 Essential (primary) hypertension: Secondary | ICD-10-CM | POA: Diagnosis not present

## 2023-02-12 DIAGNOSIS — F0280 Dementia in other diseases classified elsewhere without behavioral disturbance: Secondary | ICD-10-CM | POA: Diagnosis not present

## 2023-02-12 DIAGNOSIS — Z8673 Personal history of transient ischemic attack (TIA), and cerebral infarction without residual deficits: Secondary | ICD-10-CM | POA: Diagnosis not present

## 2023-02-12 DIAGNOSIS — M4326 Fusion of spine, lumbar region: Secondary | ICD-10-CM | POA: Diagnosis not present

## 2023-02-12 DIAGNOSIS — Z87891 Personal history of nicotine dependence: Secondary | ICD-10-CM | POA: Diagnosis not present

## 2023-02-12 DIAGNOSIS — G301 Alzheimer's disease with late onset: Secondary | ICD-10-CM | POA: Diagnosis not present

## 2023-02-12 DIAGNOSIS — H3530 Unspecified macular degeneration: Secondary | ICD-10-CM | POA: Diagnosis not present

## 2023-02-12 DIAGNOSIS — J449 Chronic obstructive pulmonary disease, unspecified: Secondary | ICD-10-CM | POA: Diagnosis not present

## 2023-02-12 DIAGNOSIS — M810 Age-related osteoporosis without current pathological fracture: Secondary | ICD-10-CM | POA: Diagnosis not present

## 2023-02-15 DIAGNOSIS — M170 Bilateral primary osteoarthritis of knee: Secondary | ICD-10-CM | POA: Diagnosis not present

## 2023-02-15 DIAGNOSIS — I10 Essential (primary) hypertension: Secondary | ICD-10-CM | POA: Diagnosis not present

## 2023-02-15 DIAGNOSIS — N39490 Overflow incontinence: Secondary | ICD-10-CM | POA: Diagnosis not present

## 2023-02-15 DIAGNOSIS — F0280 Dementia in other diseases classified elsewhere without behavioral disturbance: Secondary | ICD-10-CM | POA: Diagnosis not present

## 2023-02-15 DIAGNOSIS — N401 Enlarged prostate with lower urinary tract symptoms: Secondary | ICD-10-CM | POA: Diagnosis not present

## 2023-02-15 DIAGNOSIS — H3530 Unspecified macular degeneration: Secondary | ICD-10-CM | POA: Diagnosis not present

## 2023-02-15 DIAGNOSIS — M4326 Fusion of spine, lumbar region: Secondary | ICD-10-CM | POA: Diagnosis not present

## 2023-02-15 DIAGNOSIS — J449 Chronic obstructive pulmonary disease, unspecified: Secondary | ICD-10-CM | POA: Diagnosis not present

## 2023-02-15 DIAGNOSIS — M810 Age-related osteoporosis without current pathological fracture: Secondary | ICD-10-CM | POA: Diagnosis not present

## 2023-02-15 DIAGNOSIS — Z8673 Personal history of transient ischemic attack (TIA), and cerebral infarction without residual deficits: Secondary | ICD-10-CM | POA: Diagnosis not present

## 2023-02-15 DIAGNOSIS — Z87891 Personal history of nicotine dependence: Secondary | ICD-10-CM | POA: Diagnosis not present

## 2023-02-15 DIAGNOSIS — G301 Alzheimer's disease with late onset: Secondary | ICD-10-CM | POA: Diagnosis not present

## 2023-02-17 DIAGNOSIS — M170 Bilateral primary osteoarthritis of knee: Secondary | ICD-10-CM | POA: Diagnosis not present

## 2023-02-17 DIAGNOSIS — Z87891 Personal history of nicotine dependence: Secondary | ICD-10-CM | POA: Diagnosis not present

## 2023-02-17 DIAGNOSIS — M4326 Fusion of spine, lumbar region: Secondary | ICD-10-CM | POA: Diagnosis not present

## 2023-02-17 DIAGNOSIS — F0280 Dementia in other diseases classified elsewhere without behavioral disturbance: Secondary | ICD-10-CM | POA: Diagnosis not present

## 2023-02-17 DIAGNOSIS — I10 Essential (primary) hypertension: Secondary | ICD-10-CM | POA: Diagnosis not present

## 2023-02-17 DIAGNOSIS — N39490 Overflow incontinence: Secondary | ICD-10-CM | POA: Diagnosis not present

## 2023-02-17 DIAGNOSIS — G301 Alzheimer's disease with late onset: Secondary | ICD-10-CM | POA: Diagnosis not present

## 2023-02-17 DIAGNOSIS — N401 Enlarged prostate with lower urinary tract symptoms: Secondary | ICD-10-CM | POA: Diagnosis not present

## 2023-02-17 DIAGNOSIS — H3530 Unspecified macular degeneration: Secondary | ICD-10-CM | POA: Diagnosis not present

## 2023-02-17 DIAGNOSIS — M810 Age-related osteoporosis without current pathological fracture: Secondary | ICD-10-CM | POA: Diagnosis not present

## 2023-02-17 DIAGNOSIS — J449 Chronic obstructive pulmonary disease, unspecified: Secondary | ICD-10-CM | POA: Diagnosis not present

## 2023-02-17 DIAGNOSIS — Z8673 Personal history of transient ischemic attack (TIA), and cerebral infarction without residual deficits: Secondary | ICD-10-CM | POA: Diagnosis not present

## 2023-02-18 DIAGNOSIS — I10 Essential (primary) hypertension: Secondary | ICD-10-CM | POA: Diagnosis not present

## 2023-02-18 DIAGNOSIS — N401 Enlarged prostate with lower urinary tract symptoms: Secondary | ICD-10-CM | POA: Diagnosis not present

## 2023-02-18 DIAGNOSIS — J449 Chronic obstructive pulmonary disease, unspecified: Secondary | ICD-10-CM | POA: Diagnosis not present

## 2023-02-18 DIAGNOSIS — Z8673 Personal history of transient ischemic attack (TIA), and cerebral infarction without residual deficits: Secondary | ICD-10-CM | POA: Diagnosis not present

## 2023-02-18 DIAGNOSIS — Z87891 Personal history of nicotine dependence: Secondary | ICD-10-CM | POA: Diagnosis not present

## 2023-02-18 DIAGNOSIS — H3530 Unspecified macular degeneration: Secondary | ICD-10-CM | POA: Diagnosis not present

## 2023-02-18 DIAGNOSIS — M4326 Fusion of spine, lumbar region: Secondary | ICD-10-CM | POA: Diagnosis not present

## 2023-02-18 DIAGNOSIS — M170 Bilateral primary osteoarthritis of knee: Secondary | ICD-10-CM | POA: Diagnosis not present

## 2023-02-18 DIAGNOSIS — M810 Age-related osteoporosis without current pathological fracture: Secondary | ICD-10-CM | POA: Diagnosis not present

## 2023-02-18 DIAGNOSIS — G301 Alzheimer's disease with late onset: Secondary | ICD-10-CM | POA: Diagnosis not present

## 2023-02-18 DIAGNOSIS — N39490 Overflow incontinence: Secondary | ICD-10-CM | POA: Diagnosis not present

## 2023-02-18 DIAGNOSIS — F0280 Dementia in other diseases classified elsewhere without behavioral disturbance: Secondary | ICD-10-CM | POA: Diagnosis not present

## 2023-02-19 DIAGNOSIS — M170 Bilateral primary osteoarthritis of knee: Secondary | ICD-10-CM | POA: Diagnosis not present

## 2023-02-19 DIAGNOSIS — F0280 Dementia in other diseases classified elsewhere without behavioral disturbance: Secondary | ICD-10-CM | POA: Diagnosis not present

## 2023-02-19 DIAGNOSIS — Z87891 Personal history of nicotine dependence: Secondary | ICD-10-CM | POA: Diagnosis not present

## 2023-02-19 DIAGNOSIS — Z8673 Personal history of transient ischemic attack (TIA), and cerebral infarction without residual deficits: Secondary | ICD-10-CM | POA: Diagnosis not present

## 2023-02-19 DIAGNOSIS — N401 Enlarged prostate with lower urinary tract symptoms: Secondary | ICD-10-CM | POA: Diagnosis not present

## 2023-02-19 DIAGNOSIS — M4326 Fusion of spine, lumbar region: Secondary | ICD-10-CM | POA: Diagnosis not present

## 2023-02-19 DIAGNOSIS — J449 Chronic obstructive pulmonary disease, unspecified: Secondary | ICD-10-CM | POA: Diagnosis not present

## 2023-02-19 DIAGNOSIS — N39490 Overflow incontinence: Secondary | ICD-10-CM | POA: Diagnosis not present

## 2023-02-19 DIAGNOSIS — M810 Age-related osteoporosis without current pathological fracture: Secondary | ICD-10-CM | POA: Diagnosis not present

## 2023-02-19 DIAGNOSIS — H3530 Unspecified macular degeneration: Secondary | ICD-10-CM | POA: Diagnosis not present

## 2023-02-19 DIAGNOSIS — I10 Essential (primary) hypertension: Secondary | ICD-10-CM | POA: Diagnosis not present

## 2023-02-19 DIAGNOSIS — G301 Alzheimer's disease with late onset: Secondary | ICD-10-CM | POA: Diagnosis not present

## 2023-02-22 DIAGNOSIS — H3530 Unspecified macular degeneration: Secondary | ICD-10-CM | POA: Diagnosis not present

## 2023-02-22 DIAGNOSIS — I10 Essential (primary) hypertension: Secondary | ICD-10-CM | POA: Diagnosis not present

## 2023-02-22 DIAGNOSIS — F0280 Dementia in other diseases classified elsewhere without behavioral disturbance: Secondary | ICD-10-CM | POA: Diagnosis not present

## 2023-02-22 DIAGNOSIS — M4326 Fusion of spine, lumbar region: Secondary | ICD-10-CM | POA: Diagnosis not present

## 2023-02-22 DIAGNOSIS — Z8673 Personal history of transient ischemic attack (TIA), and cerebral infarction without residual deficits: Secondary | ICD-10-CM | POA: Diagnosis not present

## 2023-02-22 DIAGNOSIS — Z87891 Personal history of nicotine dependence: Secondary | ICD-10-CM | POA: Diagnosis not present

## 2023-02-22 DIAGNOSIS — J449 Chronic obstructive pulmonary disease, unspecified: Secondary | ICD-10-CM | POA: Diagnosis not present

## 2023-02-22 DIAGNOSIS — G301 Alzheimer's disease with late onset: Secondary | ICD-10-CM | POA: Diagnosis not present

## 2023-02-22 DIAGNOSIS — M170 Bilateral primary osteoarthritis of knee: Secondary | ICD-10-CM | POA: Diagnosis not present

## 2023-02-22 DIAGNOSIS — N39490 Overflow incontinence: Secondary | ICD-10-CM | POA: Diagnosis not present

## 2023-02-22 DIAGNOSIS — N401 Enlarged prostate with lower urinary tract symptoms: Secondary | ICD-10-CM | POA: Diagnosis not present

## 2023-02-22 DIAGNOSIS — M810 Age-related osteoporosis without current pathological fracture: Secondary | ICD-10-CM | POA: Diagnosis not present

## 2023-02-25 DIAGNOSIS — M4326 Fusion of spine, lumbar region: Secondary | ICD-10-CM | POA: Diagnosis not present

## 2023-02-25 DIAGNOSIS — N401 Enlarged prostate with lower urinary tract symptoms: Secondary | ICD-10-CM | POA: Diagnosis not present

## 2023-02-25 DIAGNOSIS — Z8673 Personal history of transient ischemic attack (TIA), and cerebral infarction without residual deficits: Secondary | ICD-10-CM | POA: Diagnosis not present

## 2023-02-25 DIAGNOSIS — N39490 Overflow incontinence: Secondary | ICD-10-CM | POA: Diagnosis not present

## 2023-02-25 DIAGNOSIS — I10 Essential (primary) hypertension: Secondary | ICD-10-CM | POA: Diagnosis not present

## 2023-02-25 DIAGNOSIS — G301 Alzheimer's disease with late onset: Secondary | ICD-10-CM | POA: Diagnosis not present

## 2023-02-25 DIAGNOSIS — J449 Chronic obstructive pulmonary disease, unspecified: Secondary | ICD-10-CM | POA: Diagnosis not present

## 2023-02-25 DIAGNOSIS — M810 Age-related osteoporosis without current pathological fracture: Secondary | ICD-10-CM | POA: Diagnosis not present

## 2023-02-25 DIAGNOSIS — F0280 Dementia in other diseases classified elsewhere without behavioral disturbance: Secondary | ICD-10-CM | POA: Diagnosis not present

## 2023-02-25 DIAGNOSIS — M170 Bilateral primary osteoarthritis of knee: Secondary | ICD-10-CM | POA: Diagnosis not present

## 2023-02-25 DIAGNOSIS — H3530 Unspecified macular degeneration: Secondary | ICD-10-CM | POA: Diagnosis not present

## 2023-02-25 DIAGNOSIS — Z87891 Personal history of nicotine dependence: Secondary | ICD-10-CM | POA: Diagnosis not present

## 2023-02-26 DIAGNOSIS — G301 Alzheimer's disease with late onset: Secondary | ICD-10-CM | POA: Diagnosis not present

## 2023-02-26 DIAGNOSIS — L309 Dermatitis, unspecified: Secondary | ICD-10-CM | POA: Diagnosis not present

## 2023-02-26 DIAGNOSIS — H3530 Unspecified macular degeneration: Secondary | ICD-10-CM | POA: Diagnosis not present

## 2023-02-26 DIAGNOSIS — N401 Enlarged prostate with lower urinary tract symptoms: Secondary | ICD-10-CM | POA: Diagnosis not present

## 2023-02-26 DIAGNOSIS — J449 Chronic obstructive pulmonary disease, unspecified: Secondary | ICD-10-CM | POA: Diagnosis not present

## 2023-02-26 DIAGNOSIS — Z87891 Personal history of nicotine dependence: Secondary | ICD-10-CM | POA: Diagnosis not present

## 2023-02-26 DIAGNOSIS — M810 Age-related osteoporosis without current pathological fracture: Secondary | ICD-10-CM | POA: Diagnosis not present

## 2023-02-26 DIAGNOSIS — F0280 Dementia in other diseases classified elsewhere without behavioral disturbance: Secondary | ICD-10-CM | POA: Diagnosis not present

## 2023-02-26 DIAGNOSIS — M4326 Fusion of spine, lumbar region: Secondary | ICD-10-CM | POA: Diagnosis not present

## 2023-02-26 DIAGNOSIS — N39490 Overflow incontinence: Secondary | ICD-10-CM | POA: Diagnosis not present

## 2023-02-26 DIAGNOSIS — M170 Bilateral primary osteoarthritis of knee: Secondary | ICD-10-CM | POA: Diagnosis not present

## 2023-02-26 DIAGNOSIS — Z8673 Personal history of transient ischemic attack (TIA), and cerebral infarction without residual deficits: Secondary | ICD-10-CM | POA: Diagnosis not present

## 2023-02-26 DIAGNOSIS — B353 Tinea pedis: Secondary | ICD-10-CM | POA: Diagnosis not present

## 2023-02-26 DIAGNOSIS — I10 Essential (primary) hypertension: Secondary | ICD-10-CM | POA: Diagnosis not present

## 2023-03-02 DIAGNOSIS — M4326 Fusion of spine, lumbar region: Secondary | ICD-10-CM | POA: Diagnosis not present

## 2023-03-02 DIAGNOSIS — Z8673 Personal history of transient ischemic attack (TIA), and cerebral infarction without residual deficits: Secondary | ICD-10-CM | POA: Diagnosis not present

## 2023-03-02 DIAGNOSIS — N401 Enlarged prostate with lower urinary tract symptoms: Secondary | ICD-10-CM | POA: Diagnosis not present

## 2023-03-02 DIAGNOSIS — F0280 Dementia in other diseases classified elsewhere without behavioral disturbance: Secondary | ICD-10-CM | POA: Diagnosis not present

## 2023-03-02 DIAGNOSIS — J449 Chronic obstructive pulmonary disease, unspecified: Secondary | ICD-10-CM | POA: Diagnosis not present

## 2023-03-02 DIAGNOSIS — M810 Age-related osteoporosis without current pathological fracture: Secondary | ICD-10-CM | POA: Diagnosis not present

## 2023-03-02 DIAGNOSIS — M170 Bilateral primary osteoarthritis of knee: Secondary | ICD-10-CM | POA: Diagnosis not present

## 2023-03-02 DIAGNOSIS — Z87891 Personal history of nicotine dependence: Secondary | ICD-10-CM | POA: Diagnosis not present

## 2023-03-02 DIAGNOSIS — H3530 Unspecified macular degeneration: Secondary | ICD-10-CM | POA: Diagnosis not present

## 2023-03-02 DIAGNOSIS — G301 Alzheimer's disease with late onset: Secondary | ICD-10-CM | POA: Diagnosis not present

## 2023-03-02 DIAGNOSIS — I10 Essential (primary) hypertension: Secondary | ICD-10-CM | POA: Diagnosis not present

## 2023-03-02 DIAGNOSIS — N39490 Overflow incontinence: Secondary | ICD-10-CM | POA: Diagnosis not present

## 2023-03-04 DIAGNOSIS — Z87891 Personal history of nicotine dependence: Secondary | ICD-10-CM | POA: Diagnosis not present

## 2023-03-04 DIAGNOSIS — M810 Age-related osteoporosis without current pathological fracture: Secondary | ICD-10-CM | POA: Diagnosis not present

## 2023-03-04 DIAGNOSIS — H3530 Unspecified macular degeneration: Secondary | ICD-10-CM | POA: Diagnosis not present

## 2023-03-04 DIAGNOSIS — N39490 Overflow incontinence: Secondary | ICD-10-CM | POA: Diagnosis not present

## 2023-03-04 DIAGNOSIS — N401 Enlarged prostate with lower urinary tract symptoms: Secondary | ICD-10-CM | POA: Diagnosis not present

## 2023-03-04 DIAGNOSIS — G301 Alzheimer's disease with late onset: Secondary | ICD-10-CM | POA: Diagnosis not present

## 2023-03-04 DIAGNOSIS — Z8673 Personal history of transient ischemic attack (TIA), and cerebral infarction without residual deficits: Secondary | ICD-10-CM | POA: Diagnosis not present

## 2023-03-04 DIAGNOSIS — I10 Essential (primary) hypertension: Secondary | ICD-10-CM | POA: Diagnosis not present

## 2023-03-04 DIAGNOSIS — M4326 Fusion of spine, lumbar region: Secondary | ICD-10-CM | POA: Diagnosis not present

## 2023-03-04 DIAGNOSIS — F0280 Dementia in other diseases classified elsewhere without behavioral disturbance: Secondary | ICD-10-CM | POA: Diagnosis not present

## 2023-03-04 DIAGNOSIS — M170 Bilateral primary osteoarthritis of knee: Secondary | ICD-10-CM | POA: Diagnosis not present

## 2023-03-04 DIAGNOSIS — J449 Chronic obstructive pulmonary disease, unspecified: Secondary | ICD-10-CM | POA: Diagnosis not present

## 2023-03-05 DIAGNOSIS — F0280 Dementia in other diseases classified elsewhere without behavioral disturbance: Secondary | ICD-10-CM | POA: Diagnosis not present

## 2023-03-05 DIAGNOSIS — Z8673 Personal history of transient ischemic attack (TIA), and cerebral infarction without residual deficits: Secondary | ICD-10-CM | POA: Diagnosis not present

## 2023-03-05 DIAGNOSIS — H3530 Unspecified macular degeneration: Secondary | ICD-10-CM | POA: Diagnosis not present

## 2023-03-05 DIAGNOSIS — M4326 Fusion of spine, lumbar region: Secondary | ICD-10-CM | POA: Diagnosis not present

## 2023-03-05 DIAGNOSIS — N39490 Overflow incontinence: Secondary | ICD-10-CM | POA: Diagnosis not present

## 2023-03-05 DIAGNOSIS — M170 Bilateral primary osteoarthritis of knee: Secondary | ICD-10-CM | POA: Diagnosis not present

## 2023-03-05 DIAGNOSIS — I10 Essential (primary) hypertension: Secondary | ICD-10-CM | POA: Diagnosis not present

## 2023-03-05 DIAGNOSIS — M810 Age-related osteoporosis without current pathological fracture: Secondary | ICD-10-CM | POA: Diagnosis not present

## 2023-03-05 DIAGNOSIS — G301 Alzheimer's disease with late onset: Secondary | ICD-10-CM | POA: Diagnosis not present

## 2023-03-05 DIAGNOSIS — J449 Chronic obstructive pulmonary disease, unspecified: Secondary | ICD-10-CM | POA: Diagnosis not present

## 2023-03-05 DIAGNOSIS — N401 Enlarged prostate with lower urinary tract symptoms: Secondary | ICD-10-CM | POA: Diagnosis not present

## 2023-03-05 DIAGNOSIS — Z87891 Personal history of nicotine dependence: Secondary | ICD-10-CM | POA: Diagnosis not present

## 2023-03-10 DIAGNOSIS — I10 Essential (primary) hypertension: Secondary | ICD-10-CM | POA: Diagnosis not present

## 2023-03-10 DIAGNOSIS — F0280 Dementia in other diseases classified elsewhere without behavioral disturbance: Secondary | ICD-10-CM | POA: Diagnosis not present

## 2023-03-10 DIAGNOSIS — N39490 Overflow incontinence: Secondary | ICD-10-CM | POA: Diagnosis not present

## 2023-03-10 DIAGNOSIS — Z8673 Personal history of transient ischemic attack (TIA), and cerebral infarction without residual deficits: Secondary | ICD-10-CM | POA: Diagnosis not present

## 2023-03-10 DIAGNOSIS — Z87891 Personal history of nicotine dependence: Secondary | ICD-10-CM | POA: Diagnosis not present

## 2023-03-10 DIAGNOSIS — M4326 Fusion of spine, lumbar region: Secondary | ICD-10-CM | POA: Diagnosis not present

## 2023-03-10 DIAGNOSIS — H3530 Unspecified macular degeneration: Secondary | ICD-10-CM | POA: Diagnosis not present

## 2023-03-10 DIAGNOSIS — M810 Age-related osteoporosis without current pathological fracture: Secondary | ICD-10-CM | POA: Diagnosis not present

## 2023-03-10 DIAGNOSIS — J449 Chronic obstructive pulmonary disease, unspecified: Secondary | ICD-10-CM | POA: Diagnosis not present

## 2023-03-10 DIAGNOSIS — N401 Enlarged prostate with lower urinary tract symptoms: Secondary | ICD-10-CM | POA: Diagnosis not present

## 2023-03-10 DIAGNOSIS — M170 Bilateral primary osteoarthritis of knee: Secondary | ICD-10-CM | POA: Diagnosis not present

## 2023-03-10 DIAGNOSIS — G301 Alzheimer's disease with late onset: Secondary | ICD-10-CM | POA: Diagnosis not present

## 2023-03-12 DIAGNOSIS — J449 Chronic obstructive pulmonary disease, unspecified: Secondary | ICD-10-CM | POA: Diagnosis not present

## 2023-03-12 DIAGNOSIS — I10 Essential (primary) hypertension: Secondary | ICD-10-CM | POA: Diagnosis not present

## 2023-03-12 DIAGNOSIS — M170 Bilateral primary osteoarthritis of knee: Secondary | ICD-10-CM | POA: Diagnosis not present

## 2023-03-12 DIAGNOSIS — H3530 Unspecified macular degeneration: Secondary | ICD-10-CM | POA: Diagnosis not present

## 2023-03-12 DIAGNOSIS — M4326 Fusion of spine, lumbar region: Secondary | ICD-10-CM | POA: Diagnosis not present

## 2023-03-12 DIAGNOSIS — Z8673 Personal history of transient ischemic attack (TIA), and cerebral infarction without residual deficits: Secondary | ICD-10-CM | POA: Diagnosis not present

## 2023-03-12 DIAGNOSIS — N39490 Overflow incontinence: Secondary | ICD-10-CM | POA: Diagnosis not present

## 2023-03-12 DIAGNOSIS — M810 Age-related osteoporosis without current pathological fracture: Secondary | ICD-10-CM | POA: Diagnosis not present

## 2023-03-12 DIAGNOSIS — F0280 Dementia in other diseases classified elsewhere without behavioral disturbance: Secondary | ICD-10-CM | POA: Diagnosis not present

## 2023-03-12 DIAGNOSIS — N401 Enlarged prostate with lower urinary tract symptoms: Secondary | ICD-10-CM | POA: Diagnosis not present

## 2023-03-12 DIAGNOSIS — G301 Alzheimer's disease with late onset: Secondary | ICD-10-CM | POA: Diagnosis not present

## 2023-03-12 DIAGNOSIS — Z87891 Personal history of nicotine dependence: Secondary | ICD-10-CM | POA: Diagnosis not present

## 2023-03-17 DIAGNOSIS — M170 Bilateral primary osteoarthritis of knee: Secondary | ICD-10-CM | POA: Diagnosis not present

## 2023-03-17 DIAGNOSIS — I10 Essential (primary) hypertension: Secondary | ICD-10-CM | POA: Diagnosis not present

## 2023-03-17 DIAGNOSIS — Z8673 Personal history of transient ischemic attack (TIA), and cerebral infarction without residual deficits: Secondary | ICD-10-CM | POA: Diagnosis not present

## 2023-03-17 DIAGNOSIS — J449 Chronic obstructive pulmonary disease, unspecified: Secondary | ICD-10-CM | POA: Diagnosis not present

## 2023-03-17 DIAGNOSIS — G301 Alzheimer's disease with late onset: Secondary | ICD-10-CM | POA: Diagnosis not present

## 2023-03-17 DIAGNOSIS — M810 Age-related osteoporosis without current pathological fracture: Secondary | ICD-10-CM | POA: Diagnosis not present

## 2023-03-17 DIAGNOSIS — M4326 Fusion of spine, lumbar region: Secondary | ICD-10-CM | POA: Diagnosis not present

## 2023-03-17 DIAGNOSIS — N39490 Overflow incontinence: Secondary | ICD-10-CM | POA: Diagnosis not present

## 2023-03-17 DIAGNOSIS — H3530 Unspecified macular degeneration: Secondary | ICD-10-CM | POA: Diagnosis not present

## 2023-03-17 DIAGNOSIS — N401 Enlarged prostate with lower urinary tract symptoms: Secondary | ICD-10-CM | POA: Diagnosis not present

## 2023-03-17 DIAGNOSIS — Z87891 Personal history of nicotine dependence: Secondary | ICD-10-CM | POA: Diagnosis not present

## 2023-03-17 DIAGNOSIS — F0280 Dementia in other diseases classified elsewhere without behavioral disturbance: Secondary | ICD-10-CM | POA: Diagnosis not present

## 2023-03-19 DIAGNOSIS — M170 Bilateral primary osteoarthritis of knee: Secondary | ICD-10-CM | POA: Diagnosis not present

## 2023-03-19 DIAGNOSIS — G301 Alzheimer's disease with late onset: Secondary | ICD-10-CM | POA: Diagnosis not present

## 2023-03-19 DIAGNOSIS — N401 Enlarged prostate with lower urinary tract symptoms: Secondary | ICD-10-CM | POA: Diagnosis not present

## 2023-03-19 DIAGNOSIS — N39490 Overflow incontinence: Secondary | ICD-10-CM | POA: Diagnosis not present

## 2023-03-19 DIAGNOSIS — Z8673 Personal history of transient ischemic attack (TIA), and cerebral infarction without residual deficits: Secondary | ICD-10-CM | POA: Diagnosis not present

## 2023-03-19 DIAGNOSIS — I10 Essential (primary) hypertension: Secondary | ICD-10-CM | POA: Diagnosis not present

## 2023-03-19 DIAGNOSIS — Z87891 Personal history of nicotine dependence: Secondary | ICD-10-CM | POA: Diagnosis not present

## 2023-03-19 DIAGNOSIS — F0280 Dementia in other diseases classified elsewhere without behavioral disturbance: Secondary | ICD-10-CM | POA: Diagnosis not present

## 2023-03-19 DIAGNOSIS — J449 Chronic obstructive pulmonary disease, unspecified: Secondary | ICD-10-CM | POA: Diagnosis not present

## 2023-03-19 DIAGNOSIS — M810 Age-related osteoporosis without current pathological fracture: Secondary | ICD-10-CM | POA: Diagnosis not present

## 2023-03-19 DIAGNOSIS — H3530 Unspecified macular degeneration: Secondary | ICD-10-CM | POA: Diagnosis not present

## 2023-03-19 DIAGNOSIS — M4326 Fusion of spine, lumbar region: Secondary | ICD-10-CM | POA: Diagnosis not present

## 2023-03-23 DIAGNOSIS — H3530 Unspecified macular degeneration: Secondary | ICD-10-CM | POA: Diagnosis not present

## 2023-03-23 DIAGNOSIS — Z87891 Personal history of nicotine dependence: Secondary | ICD-10-CM | POA: Diagnosis not present

## 2023-03-23 DIAGNOSIS — N401 Enlarged prostate with lower urinary tract symptoms: Secondary | ICD-10-CM | POA: Diagnosis not present

## 2023-03-23 DIAGNOSIS — J449 Chronic obstructive pulmonary disease, unspecified: Secondary | ICD-10-CM | POA: Diagnosis not present

## 2023-03-23 DIAGNOSIS — G301 Alzheimer's disease with late onset: Secondary | ICD-10-CM | POA: Diagnosis not present

## 2023-03-23 DIAGNOSIS — Z8673 Personal history of transient ischemic attack (TIA), and cerebral infarction without residual deficits: Secondary | ICD-10-CM | POA: Diagnosis not present

## 2023-03-23 DIAGNOSIS — M4326 Fusion of spine, lumbar region: Secondary | ICD-10-CM | POA: Diagnosis not present

## 2023-03-23 DIAGNOSIS — F0280 Dementia in other diseases classified elsewhere without behavioral disturbance: Secondary | ICD-10-CM | POA: Diagnosis not present

## 2023-03-23 DIAGNOSIS — I10 Essential (primary) hypertension: Secondary | ICD-10-CM | POA: Diagnosis not present

## 2023-03-23 DIAGNOSIS — M170 Bilateral primary osteoarthritis of knee: Secondary | ICD-10-CM | POA: Diagnosis not present

## 2023-03-23 DIAGNOSIS — M810 Age-related osteoporosis without current pathological fracture: Secondary | ICD-10-CM | POA: Diagnosis not present

## 2023-03-23 DIAGNOSIS — N39490 Overflow incontinence: Secondary | ICD-10-CM | POA: Diagnosis not present

## 2023-04-05 ENCOUNTER — Ambulatory Visit: Payer: Medicare HMO | Admitting: Podiatry

## 2023-04-08 ENCOUNTER — Encounter: Payer: Self-pay | Admitting: Podiatry

## 2023-04-08 ENCOUNTER — Ambulatory Visit: Payer: Medicare HMO | Admitting: Podiatry

## 2023-04-08 DIAGNOSIS — B351 Tinea unguium: Secondary | ICD-10-CM

## 2023-04-08 DIAGNOSIS — M79674 Pain in right toe(s): Secondary | ICD-10-CM

## 2023-04-08 DIAGNOSIS — M79675 Pain in left toe(s): Secondary | ICD-10-CM

## 2023-04-08 NOTE — Progress Notes (Signed)
  Subjective:  Patient ID: Kevin Page, male    DOB: 1930-09-26,  MRN: [id number]  Chief Complaint  Patient presents with   Nail Problem    "Cut his toenails."    89 y.o. male presents with the above complaint. History confirmed with patient. Patient presenting with pain related to dystrophic thickened elongated nails. Patient is unable to trim own nails related to nail dystrophy and/or mobility issues. Patient does not have a history of T2DM.    Objective:  Physical Exam: warm, good capillary refill nail exam onychomycosis of the toenails, onycholysis, and dystrophic nails DP pulses palpable, PT pulses palpable, and protective sensation intact Left Foot:  Pain with palpation of nails due to elongation and dystrophic growth.  Right Foot: Pain with palpation of nails due to elongation and dystrophic growth.   Assessment:   1. Pain due to onychomycosis of toenails of both feet      Plan:  Patient was evaluated and treated and all questions answered.  #Onychomycosis with pain  -Nails palliatively debrided as below. -Educated on self-care  Procedure: Nail Debridement Rationale: Pain Type of Debridement: manual, sharp debridement. Instrumentation: Nail nipper, rotary burr. Number of Nails: 10  Return in about 3 months (around 07/09/2023) for RFC.         Corinna Gab, DPM [facility] / [facility]

## 2023-04-13 DIAGNOSIS — D500 Iron deficiency anemia secondary to blood loss (chronic): Secondary | ICD-10-CM | POA: Diagnosis not present

## 2023-04-20 DIAGNOSIS — R531 Weakness: Secondary | ICD-10-CM | POA: Diagnosis not present

## 2023-04-20 DIAGNOSIS — Z23 Encounter for immunization: Secondary | ICD-10-CM | POA: Diagnosis not present

## 2023-04-20 DIAGNOSIS — L309 Dermatitis, unspecified: Secondary | ICD-10-CM | POA: Diagnosis not present

## 2023-04-20 DIAGNOSIS — M810 Age-related osteoporosis without current pathological fracture: Secondary | ICD-10-CM | POA: Diagnosis not present

## 2023-04-20 DIAGNOSIS — N401 Enlarged prostate with lower urinary tract symptoms: Secondary | ICD-10-CM | POA: Diagnosis not present

## 2023-04-20 DIAGNOSIS — Z1339 Encounter for screening examination for other mental health and behavioral disorders: Secondary | ICD-10-CM | POA: Diagnosis not present

## 2023-04-20 DIAGNOSIS — I10 Essential (primary) hypertension: Secondary | ICD-10-CM | POA: Diagnosis not present

## 2023-04-20 DIAGNOSIS — N39490 Overflow incontinence: Secondary | ICD-10-CM | POA: Diagnosis not present

## 2023-04-20 DIAGNOSIS — Z1331 Encounter for screening for depression: Secondary | ICD-10-CM | POA: Diagnosis not present

## 2023-04-20 DIAGNOSIS — F0390 Unspecified dementia without behavioral disturbance: Secondary | ICD-10-CM | POA: Diagnosis not present

## 2023-04-20 DIAGNOSIS — G301 Alzheimer's disease with late onset: Secondary | ICD-10-CM | POA: Diagnosis not present

## 2023-04-20 DIAGNOSIS — Z0000 Encounter for general adult medical examination without abnormal findings: Secondary | ICD-10-CM | POA: Diagnosis not present

## 2023-04-20 DIAGNOSIS — E785 Hyperlipidemia, unspecified: Secondary | ICD-10-CM | POA: Diagnosis not present

## 2023-04-20 DIAGNOSIS — Z8673 Personal history of transient ischemic attack (TIA), and cerebral infarction without residual deficits: Secondary | ICD-10-CM | POA: Diagnosis not present

## 2023-04-20 DIAGNOSIS — H3530 Unspecified macular degeneration: Secondary | ICD-10-CM | POA: Diagnosis not present

## 2023-04-21 DIAGNOSIS — H353134 Nonexudative age-related macular degeneration, bilateral, advanced atrophic with subfoveal involvement: Secondary | ICD-10-CM | POA: Diagnosis not present

## 2023-04-22 DIAGNOSIS — L0889 Other specified local infections of the skin and subcutaneous tissue: Secondary | ICD-10-CM | POA: Diagnosis not present

## 2023-05-07 IMAGING — MR MR HEAD W/O CM
6 of 10 series · 29 of 48 positions shown · IV contrast (Yes GAD)
Comparison: CT head July 31, 21.

CLINICAL DATA: Mental status change, persistent or worsening

EXAM:
MRI HEAD WITHOUT CONTRAST
TECHNIQUE: Multiplanar, multiecho pulse sequences of the brain and surrounding
structures were obtained without intravenous contrast.

[Series 3: DWI · axial · 3.0mm · 0.94mm/px · z∈[-16,+137]mm · 9 of 104 slices shown (1 of 2)]
[im 1/104]
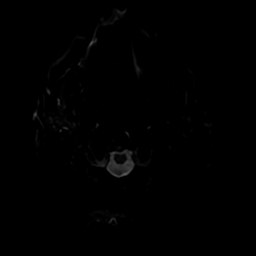
[im 13/104]
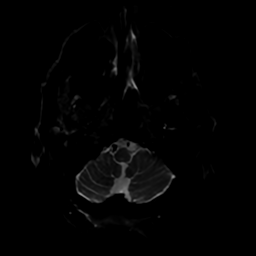
[im 26/104]
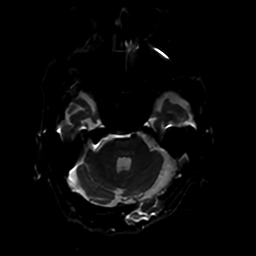
[im 39/104]
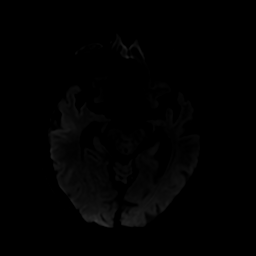
[im 52/104]
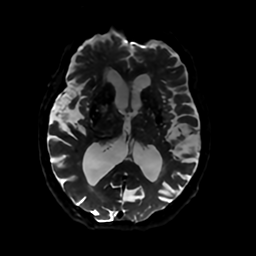
[im 65/104]
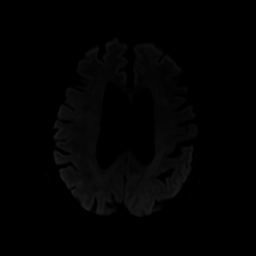
[im 78/104]
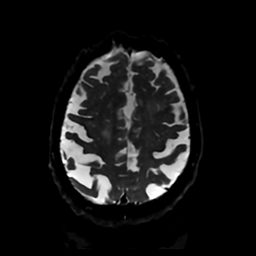
[im 91/104]
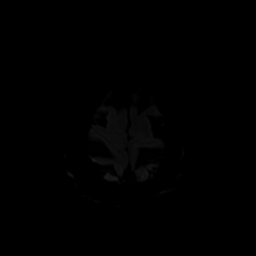
[im 104/104]
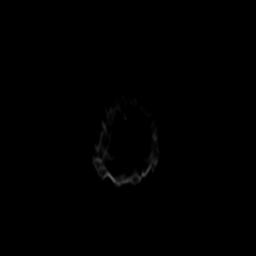

[Series 4: DWI · coronal · 4.0mm · 0.94mm/px · 7 of 78 slices shown (2 of 2)]
[im 1/78]
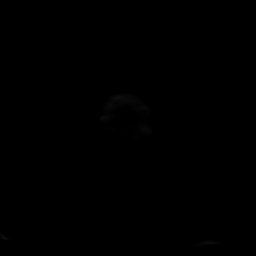
[im 13/78]
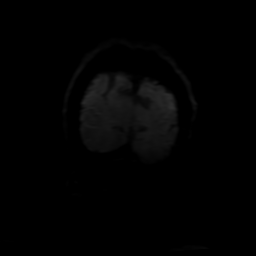
[im 26/78]
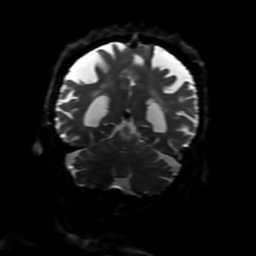
[im 39/78]
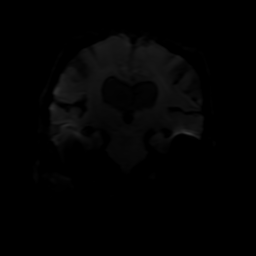
[im 52/78]
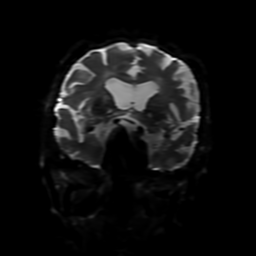
[im 65/78]
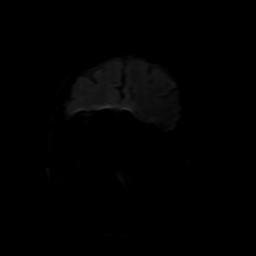
[im 78/78]
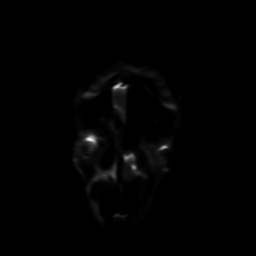

[Series 5: FLAIR · sagittal · 5.0mm · 0.23mm/px · 2 of 28 slices shown (1 of 2)]
[im 1/28]
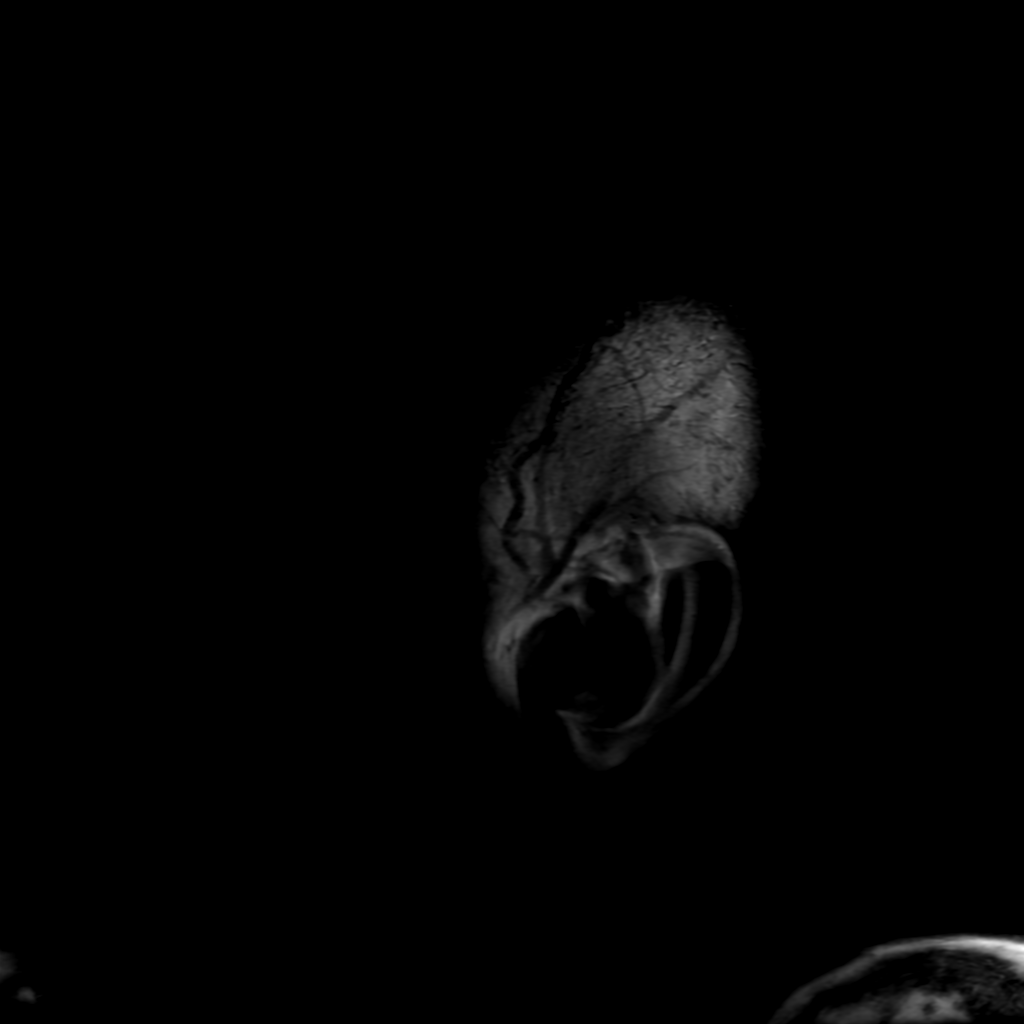
[im 28/28]
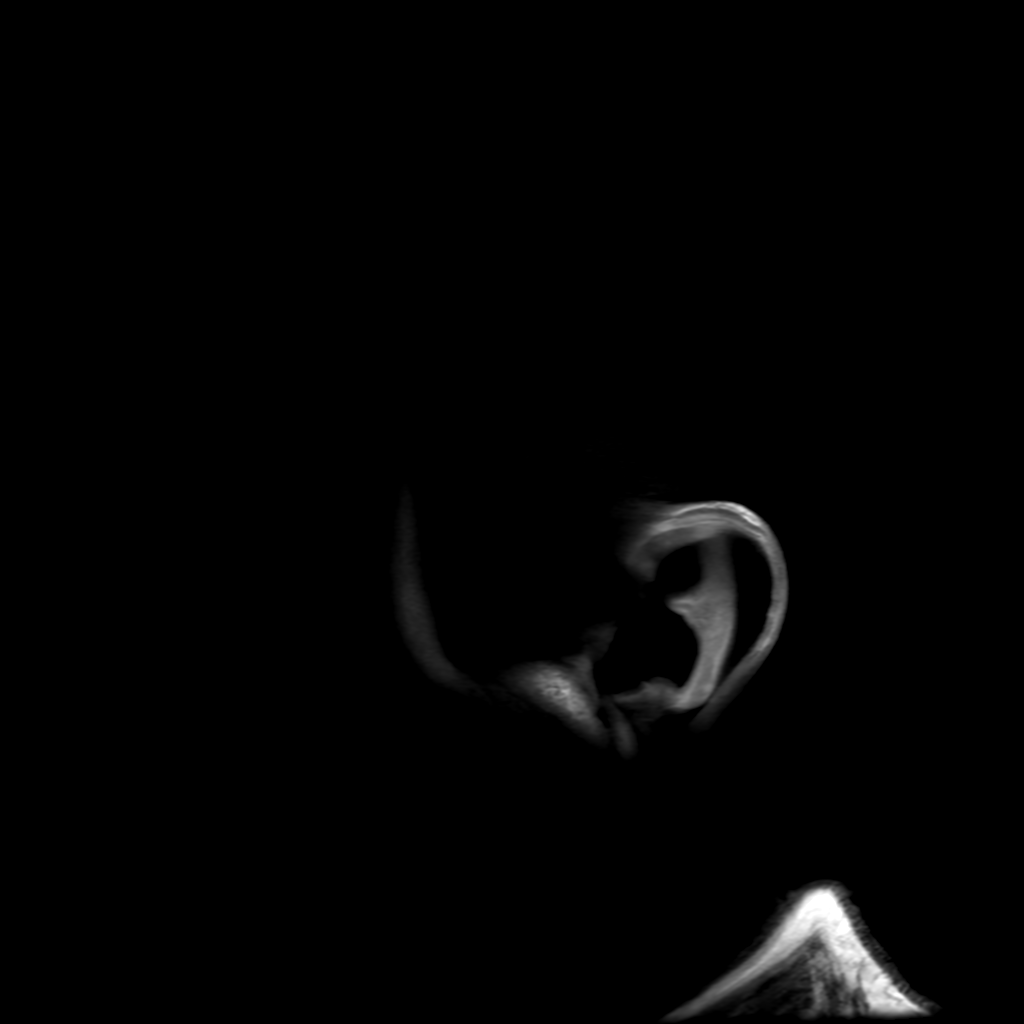

[Series 7: FLAIR · axial · 4.0mm · 0.45mm/px · z∈[-4,+141]mm · 3 of 35 slices shown (2 of 2)]
[im 1/35]
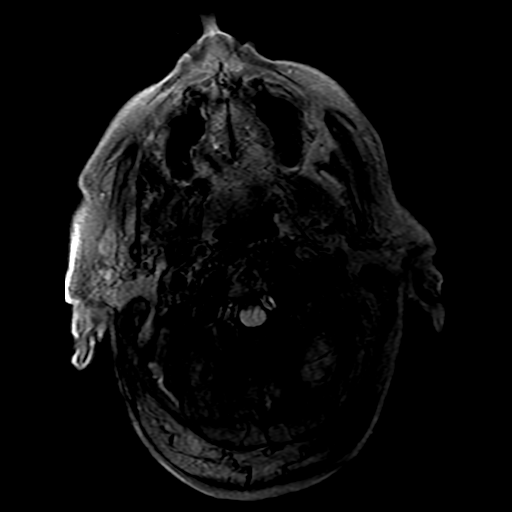
[im 18/35]
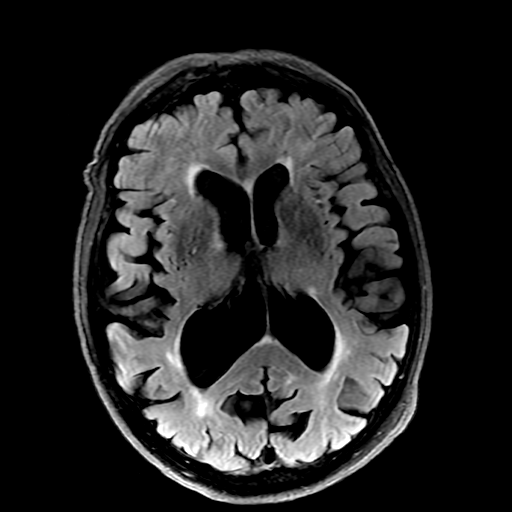
[im 35/35]
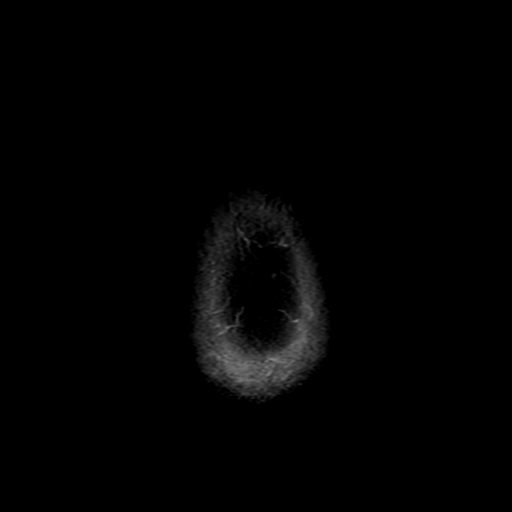

[Series 350: ADC · axial · 3.0mm · 0.94mm/px · z∈[-16,+137]mm · 5 of 53 slices shown (1 of 2)]
[im 1/53]
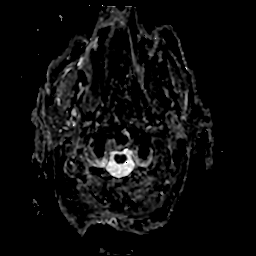
[im 14/53]
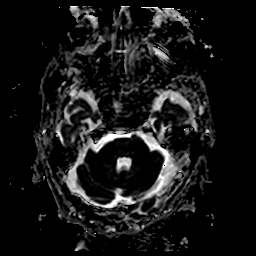
[im 27/53]
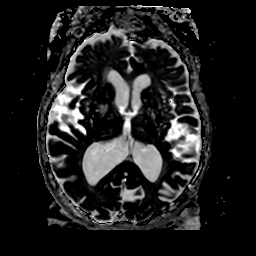
[im 40/53]
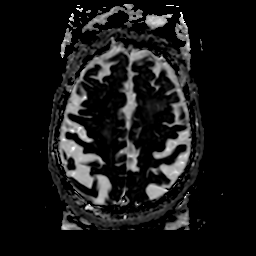
[im 53/53]
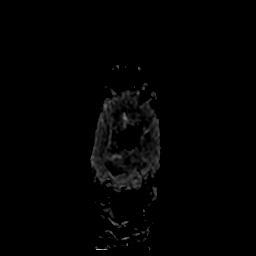

[Series 450: ADC · coronal · 4.0mm · 0.94mm/px · 3 of 40 slices shown (2 of 2)]
[im 1/40]
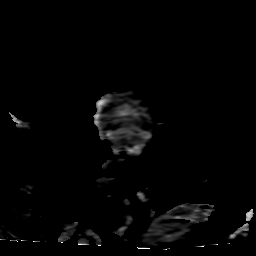
[im 20/40]
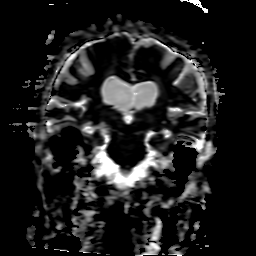
[im 40/40]
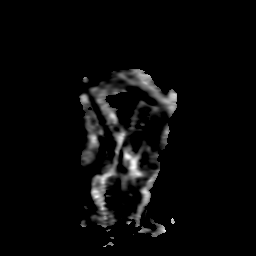

[29 of 48 positions shown; findings below may reference images not displayed]

FINDINGS: Brain: No acute infarction, acute hemorrhage, hydrocephalus,
extra-axial collection or mass lesion. Cerebral atrophy with ex
vacuo ventricular dilation. Patchy white matter T2/FLAIR
hyperintensities, nonspecific but compatible with chronic
microvascular ischemic disease.

Vascular: Major arterial flow voids are maintained at the skull
base.

Skull and upper cervical spine: Normal marrow signal.

Sinuses/Orbits: Clear sinuses.  No acute orbital findings.

Other: Trace right mastoid effusion.
IMPRESSION: 1. No evidence of acute intracranial abnormality.
2.  Cerebral atrophy ([id number]).

## 2023-05-18 DIAGNOSIS — L858 Other specified epidermal thickening: Secondary | ICD-10-CM | POA: Diagnosis not present

## 2023-05-18 DIAGNOSIS — L0889 Other specified local infections of the skin and subcutaneous tissue: Secondary | ICD-10-CM | POA: Diagnosis not present

## 2023-05-18 DIAGNOSIS — L309 Dermatitis, unspecified: Secondary | ICD-10-CM | POA: Diagnosis not present

## 2023-05-31 DIAGNOSIS — F0280 Dementia in other diseases classified elsewhere without behavioral disturbance: Secondary | ICD-10-CM | POA: Diagnosis not present

## 2023-05-31 DIAGNOSIS — I10 Essential (primary) hypertension: Secondary | ICD-10-CM | POA: Diagnosis not present

## 2023-05-31 DIAGNOSIS — H3530 Unspecified macular degeneration: Secondary | ICD-10-CM | POA: Diagnosis not present

## 2023-05-31 DIAGNOSIS — M810 Age-related osteoporosis without current pathological fracture: Secondary | ICD-10-CM | POA: Diagnosis not present

## 2023-05-31 DIAGNOSIS — N39490 Overflow incontinence: Secondary | ICD-10-CM | POA: Diagnosis not present

## 2023-05-31 DIAGNOSIS — G301 Alzheimer's disease with late onset: Secondary | ICD-10-CM | POA: Diagnosis not present

## 2023-05-31 DIAGNOSIS — I4891 Unspecified atrial fibrillation: Secondary | ICD-10-CM | POA: Diagnosis not present

## 2023-05-31 DIAGNOSIS — N401 Enlarged prostate with lower urinary tract symptoms: Secondary | ICD-10-CM | POA: Diagnosis not present

## 2023-05-31 DIAGNOSIS — J449 Chronic obstructive pulmonary disease, unspecified: Secondary | ICD-10-CM | POA: Diagnosis not present

## 2023-05-31 DIAGNOSIS — Z87891 Personal history of nicotine dependence: Secondary | ICD-10-CM | POA: Diagnosis not present

## 2023-05-31 DIAGNOSIS — Z981 Arthrodesis status: Secondary | ICD-10-CM | POA: Diagnosis not present

## 2023-05-31 DIAGNOSIS — M170 Bilateral primary osteoarthritis of knee: Secondary | ICD-10-CM | POA: Diagnosis not present

## 2023-07-06 ENCOUNTER — Encounter: Payer: Self-pay | Admitting: Podiatry

## 2023-07-06 ENCOUNTER — Ambulatory Visit: Payer: Medicare HMO | Admitting: Podiatry

## 2023-07-06 DIAGNOSIS — F0390 Unspecified dementia without behavioral disturbance: Secondary | ICD-10-CM

## 2023-07-06 DIAGNOSIS — M79675 Pain in left toe(s): Secondary | ICD-10-CM

## 2023-07-06 DIAGNOSIS — B351 Tinea unguium: Secondary | ICD-10-CM | POA: Diagnosis not present

## 2023-07-06 DIAGNOSIS — M79674 Pain in right toe(s): Secondary | ICD-10-CM | POA: Diagnosis not present

## 2023-07-06 NOTE — Progress Notes (Signed)
This patient presents to the office with chief complaint of long thick painful nails.  Patient says the nails are painful walking and wearing shoes.  This patient is unable to self treat.  This patient is unable to trim his nails since she is unable to reach his nails.  he presents to the office for preventative foot care services. He presents to the office with his wife.  General Appearance  Alert, conversant and in no acute stress.  Vascular  Dorsalis pedis and posterior tibial  pulses are weakly  palpable  bilaterally.  Capillary return is within normal limits  bilaterally. Temperature is within normal limits  bilaterally.  Neurologic  Senn-Weinstein monofilament wire test within normal limits  bilaterally. Muscle power within normal limits bilaterally.  Nails Thick disfigured discolored nails with subungual debris  from hallux to fifth toes bilaterally. No evidence of bacterial infection or drainage bilaterally.  Orthopedic  No limitations of motion  feet .  No crepitus or effusions noted.  No bony pathology or digital deformities noted.  Skin  normotropic skin with no porokeratosis noted bilaterally.  No signs of infections or ulcers noted.     Onychomycosis  Nails  B/L.  Pain in right toes  Pain in left toes  Debridement of nails both feet followed trimming the nails with dremel tool.    RTC 3 months.   Helane Gunther DPM

## 2023-08-10 ENCOUNTER — Other Ambulatory Visit (HOSPITAL_COMMUNITY): Payer: Self-pay | Admitting: Registered Nurse

## 2023-08-10 DIAGNOSIS — I10 Essential (primary) hypertension: Secondary | ICD-10-CM | POA: Diagnosis not present

## 2023-08-10 DIAGNOSIS — L0390 Cellulitis, unspecified: Secondary | ICD-10-CM | POA: Diagnosis not present

## 2023-08-10 DIAGNOSIS — R600 Localized edema: Secondary | ICD-10-CM | POA: Diagnosis not present

## 2023-08-10 DIAGNOSIS — G301 Alzheimer's disease with late onset: Secondary | ICD-10-CM | POA: Diagnosis not present

## 2023-08-10 DIAGNOSIS — I872 Venous insufficiency (chronic) (peripheral): Secondary | ICD-10-CM | POA: Diagnosis not present

## 2023-08-10 DIAGNOSIS — R531 Weakness: Secondary | ICD-10-CM | POA: Diagnosis not present

## 2023-08-10 DIAGNOSIS — I739 Peripheral vascular disease, unspecified: Secondary | ICD-10-CM | POA: Diagnosis not present

## 2023-08-10 DIAGNOSIS — R001 Bradycardia, unspecified: Secondary | ICD-10-CM | POA: Diagnosis not present

## 2023-08-10 DIAGNOSIS — F0390 Unspecified dementia without behavioral disturbance: Secondary | ICD-10-CM | POA: Diagnosis not present

## 2023-08-10 DIAGNOSIS — F17201 Nicotine dependence, unspecified, in remission: Secondary | ICD-10-CM | POA: Diagnosis not present

## 2023-08-10 DIAGNOSIS — J449 Chronic obstructive pulmonary disease, unspecified: Secondary | ICD-10-CM | POA: Diagnosis not present

## 2023-08-10 DIAGNOSIS — R609 Edema, unspecified: Secondary | ICD-10-CM | POA: Diagnosis not present

## 2023-08-11 ENCOUNTER — Ambulatory Visit (HOSPITAL_COMMUNITY)
Admission: RE | Admit: 2023-08-11 | Discharge: 2023-08-11 | Disposition: A | Discharge status: Home / Self Care | Source: Ambulatory Visit | Attending: Registered Nurse | Admitting: Registered Nurse

## 2023-08-11 DIAGNOSIS — R600 Localized edema: Secondary | ICD-10-CM | POA: Diagnosis not present

## 2023-08-11 DIAGNOSIS — I739 Peripheral vascular disease, unspecified: Secondary | ICD-10-CM | POA: Diagnosis not present

## 2023-08-11 DIAGNOSIS — I872 Venous insufficiency (chronic) (peripheral): Secondary | ICD-10-CM | POA: Insufficient documentation

## 2023-08-11 NOTE — Progress Notes (Signed)
 Lower extremity venous duplex completed. Please see CV Procedures for preliminary results.  Shona Simpson, RVT 08/11/23 10:38 AM

## 2023-09-13 DIAGNOSIS — R21 Rash and other nonspecific skin eruption: Secondary | ICD-10-CM | POA: Diagnosis not present

## 2023-09-16 DIAGNOSIS — M25511 Pain in right shoulder: Secondary | ICD-10-CM | POA: Diagnosis not present

## 2023-09-16 DIAGNOSIS — M25411 Effusion, right shoulder: Secondary | ICD-10-CM | POA: Diagnosis not present

## 2023-10-05 ENCOUNTER — Encounter: Payer: Self-pay | Admitting: Podiatry

## 2023-10-05 ENCOUNTER — Ambulatory Visit: Payer: Medicare HMO | Admitting: Podiatry

## 2023-10-05 DIAGNOSIS — M79674 Pain in right toe(s): Secondary | ICD-10-CM | POA: Diagnosis not present

## 2023-10-05 DIAGNOSIS — F0390 Unspecified dementia without behavioral disturbance: Secondary | ICD-10-CM | POA: Diagnosis not present

## 2023-10-05 DIAGNOSIS — B351 Tinea unguium: Secondary | ICD-10-CM | POA: Diagnosis not present

## 2023-10-05 DIAGNOSIS — M79675 Pain in left toe(s): Secondary | ICD-10-CM

## 2023-10-05 NOTE — Progress Notes (Signed)
This patient presents to the office with chief complaint of long thick painful nails.  Patient says the nails are painful walking and wearing shoes.  This patient is unable to self treat.  This patient is unable to trim his nails since she is unable to reach his nails.  he presents to the office for preventative foot care services. He presents to the office with his wife.  General Appearance  Alert, conversant and in no acute stress.  Vascular  Dorsalis pedis and posterior tibial  pulses are weakly  palpable  bilaterally.  Capillary return is within normal limits  bilaterally. Temperature is within normal limits  bilaterally.  Neurologic  Senn-Weinstein monofilament wire test within normal limits  bilaterally. Muscle power within normal limits bilaterally.  Nails Thick disfigured discolored nails with subungual debris  from hallux to fifth toes bilaterally. No evidence of bacterial infection or drainage bilaterally.  Orthopedic  No limitations of motion  feet .  No crepitus or effusions noted.  No bony pathology or digital deformities noted.  Skin  normotropic skin with no porokeratosis noted bilaterally.  No signs of infections or ulcers noted.     Onychomycosis  Nails  B/L.  Pain in right toes  Pain in left toes  Debridement of nails both feet followed trimming the nails with dremel tool.    RTC 3 months.   Helane Gunther DPM

## 2023-10-08 ENCOUNTER — Other Ambulatory Visit: Payer: Self-pay | Admitting: *Deleted

## 2023-10-08 DIAGNOSIS — R600 Localized edema: Secondary | ICD-10-CM

## 2023-10-18 ENCOUNTER — Other Ambulatory Visit: Payer: Self-pay

## 2023-10-18 ENCOUNTER — Emergency Department (HOSPITAL_COMMUNITY)

## 2023-10-18 ENCOUNTER — Observation Stay (HOSPITAL_COMMUNITY)
Admission: EM | Admit: 2023-10-18 | Discharge: 2023-10-20 | Disposition: A | Discharge status: Home Health | Attending: Internal Medicine | Admitting: Internal Medicine

## 2023-10-18 ENCOUNTER — Encounter (HOSPITAL_COMMUNITY): Payer: Self-pay

## 2023-10-18 DIAGNOSIS — F0390 Unspecified dementia without behavioral disturbance: Secondary | ICD-10-CM | POA: Diagnosis not present

## 2023-10-18 DIAGNOSIS — I10 Essential (primary) hypertension: Secondary | ICD-10-CM | POA: Diagnosis not present

## 2023-10-18 DIAGNOSIS — R918 Other nonspecific abnormal finding of lung field: Secondary | ICD-10-CM | POA: Diagnosis not present

## 2023-10-18 DIAGNOSIS — G8929 Other chronic pain: Secondary | ICD-10-CM

## 2023-10-18 DIAGNOSIS — Z043 Encounter for examination and observation following other accident: Secondary | ICD-10-CM | POA: Diagnosis not present

## 2023-10-18 DIAGNOSIS — Y92009 Unspecified place in unspecified non-institutional (private) residence as the place of occurrence of the external cause: Secondary | ICD-10-CM | POA: Diagnosis not present

## 2023-10-18 DIAGNOSIS — N3000 Acute cystitis without hematuria: Secondary | ICD-10-CM | POA: Diagnosis not present

## 2023-10-18 DIAGNOSIS — M129 Arthropathy, unspecified: Secondary | ICD-10-CM | POA: Diagnosis not present

## 2023-10-18 DIAGNOSIS — Z87891 Personal history of nicotine dependence: Secondary | ICD-10-CM | POA: Diagnosis not present

## 2023-10-18 DIAGNOSIS — N390 Urinary tract infection, site not specified: Principal | ICD-10-CM | POA: Diagnosis present

## 2023-10-18 DIAGNOSIS — I739 Peripheral vascular disease, unspecified: Secondary | ICD-10-CM | POA: Diagnosis not present

## 2023-10-18 DIAGNOSIS — R9389 Abnormal findings on diagnostic imaging of other specified body structures: Secondary | ICD-10-CM | POA: Diagnosis not present

## 2023-10-18 DIAGNOSIS — M810 Age-related osteoporosis without current pathological fracture: Secondary | ICD-10-CM | POA: Diagnosis not present

## 2023-10-18 DIAGNOSIS — M25511 Pain in right shoulder: Secondary | ICD-10-CM | POA: Diagnosis not present

## 2023-10-18 DIAGNOSIS — M19011 Primary osteoarthritis, right shoulder: Secondary | ICD-10-CM | POA: Diagnosis not present

## 2023-10-18 DIAGNOSIS — R531 Weakness: Secondary | ICD-10-CM | POA: Diagnosis not present

## 2023-10-18 DIAGNOSIS — W19XXXA Unspecified fall, initial encounter: Secondary | ICD-10-CM | POA: Diagnosis not present

## 2023-10-18 DIAGNOSIS — M85831 Other specified disorders of bone density and structure, right forearm: Secondary | ICD-10-CM | POA: Diagnosis not present

## 2023-10-18 DIAGNOSIS — I872 Venous insufficiency (chronic) (peripheral): Secondary | ICD-10-CM | POA: Insufficient documentation

## 2023-10-18 DIAGNOSIS — M79631 Pain in right forearm: Secondary | ICD-10-CM | POA: Diagnosis not present

## 2023-10-18 LAB — COMPREHENSIVE METABOLIC PANEL WITH GFR
ALT: 13 U/L (ref 0–44)
AST: 27 U/L (ref 15–41)
Albumin: 3.6 g/dL (ref 3.5–5.0)
Alkaline Phosphatase: 88 U/L (ref 38–126)
Anion gap: 8 (ref 5–15)
BUN: 19 mg/dL (ref 8–23)
CO2: 27 mmol/L (ref 22–32)
Calcium: 8.8 mg/dL — ABNORMAL LOW (ref 8.9–10.3)
Chloride: 98 mmol/L (ref 98–111)
Creatinine, Ser: 0.81 mg/dL (ref 0.61–1.24)
GFR, Estimated: >60 mL/min (ref >60)
Glucose, Bld: 109 mg/dL — ABNORMAL HIGH (ref 70–99)
Potassium: 4.2 mmol/L (ref 3.5–5.1)
Sodium: 133 mmol/L — ABNORMAL LOW (ref 135–145)
Total Bilirubin: 1.1 mg/dL (ref 0.0–1.2)
Total Protein: 6.4 g/dL — ABNORMAL LOW (ref 6.5–8.1)

## 2023-10-18 LAB — URINALYSIS, W/ REFLEX TO CULTURE (INFECTION SUSPECTED)
Glucose, UA: NEGATIVE mg/dL
Ketones, ur: NEGATIVE mg/dL
Nitrite: NEGATIVE
Protein, ur: 30 mg/dL — AB
RBC / HPF: >50 RBC/hpf (ref 0–5)
Specific Gravity, Urine: 1.027 (ref 1.005–1.030)
WBC, UA: >50 WBC/hpf (ref 0–5)
pH: 5 (ref 5.0–8.0)

## 2023-10-18 LAB — CBC WITH DIFFERENTIAL/PLATELET
Abs Immature Granulocytes: 0.07 10*3/uL (ref 0.00–0.07)
Basophils Absolute: 0.1 10*3/uL (ref 0.0–0.1)
Basophils Relative: 0 %
Eosinophils Absolute: 0.1 10*3/uL (ref 0.0–0.5)
Eosinophils Relative: 0 %
HCT: 39.9 % (ref 39.0–52.0)
Hemoglobin: 12.7 g/dL — ABNORMAL LOW (ref 13.0–17.0)
Immature Granulocytes: 1 %
Lymphocytes Relative: 5 %
Lymphs Abs: 0.7 10*3/uL (ref 0.7–4.0)
MCH: 31.1 pg (ref 26.0–34.0)
MCHC: 31.8 g/dL (ref 30.0–36.0)
MCV: 97.6 fL (ref 80.0–100.0)
Monocytes Absolute: 1.1 10*3/uL — ABNORMAL HIGH (ref 0.1–1.0)
Monocytes Relative: 7 %
Neutro Abs: 13 10*3/uL — ABNORMAL HIGH (ref 1.7–7.7)
Neutrophils Relative %: 87 %
Platelets: 310 10*3/uL (ref 150–400)
RBC: 4.09 MIL/uL — ABNORMAL LOW (ref 4.22–5.81)
RDW: 13.2 % (ref 11.5–15.5)
WBC: 15 10*3/uL — ABNORMAL HIGH (ref 4.0–10.5)
nRBC: 0 % (ref 0.0–0.2)

## 2023-10-18 LAB — TROPONIN I (HIGH SENSITIVITY)
Troponin I (High Sensitivity): 40 ng/L — ABNORMAL HIGH (ref <18)
Troponin I (High Sensitivity): 43 ng/L — ABNORMAL HIGH (ref <18)

## 2023-10-18 LAB — I-STAT CG4 LACTIC ACID, ED: Lactic Acid, Venous: 1.3 mmol/L (ref 0.5–1.9)

## 2023-10-18 MED ORDER — SODIUM CHLORIDE 0.9 % IV SOLN
1.0000 g | INTRAVENOUS | Status: DC
  Administered 2023-10-19: 1 g via INTRAVENOUS
  Filled 2023-10-18: qty 10

## 2023-10-18 MED ORDER — ONDANSETRON HCL 4 MG/2ML IJ SOLN: 4.0000 mg | Freq: Four times a day (QID) | INTRAMUSCULAR | Status: DC | PRN

## 2023-10-18 MED ORDER — SODIUM CHLORIDE 0.9 % IV SOLN
1.0000 g | Freq: Once | INTRAVENOUS | Status: AC
  Administered 2023-10-18: 1 g via INTRAVENOUS
  Filled 2023-10-18: qty 10

## 2023-10-18 MED ORDER — ONDANSETRON HCL 4 MG PO TABS: 4.0000 mg | ORAL_TABLET | Freq: Four times a day (QID) | ORAL | Status: DC | PRN

## 2023-10-18 MED ORDER — ACETAMINOPHEN 325 MG PO TABS: 650.0000 mg | ORAL_TABLET | Freq: Four times a day (QID) | ORAL | Status: DC | PRN

## 2023-10-18 MED ORDER — ENOXAPARIN SODIUM 30 MG/0.3ML IJ SOSY: 30.0000 mg | PREFILLED_SYRINGE | INTRAMUSCULAR | Status: DC

## 2023-10-18 MED ORDER — ENOXAPARIN SODIUM 40 MG/0.4ML IJ SOSY
40.0000 mg | PREFILLED_SYRINGE | INTRAMUSCULAR | Status: DC
  Administered 2023-10-18 – 2023-10-19 (×2): 40 mg via SUBCUTANEOUS
  Filled 2023-10-18 (×2): qty 0.4

## 2023-10-18 MED ORDER — SENNOSIDES-DOCUSATE SODIUM 8.6-50 MG PO TABS
1.0000 | ORAL_TABLET | Freq: Every evening | ORAL | Status: DC | PRN
Start: 2023-10-18 — End: 2023-10-20

## 2023-10-18 MED ORDER — SODIUM CHLORIDE 0.9 % IV BOLUS
500.0000 mL | Freq: Once | INTRAVENOUS | Status: AC
  Administered 2023-10-18: 500 mL via INTRAVENOUS

## 2023-10-18 MED ORDER — ACETAMINOPHEN 650 MG RE SUPP: 650.0000 mg | Freq: Four times a day (QID) | RECTAL | Status: DC | PRN

## 2023-10-18 NOTE — ED Triage Notes (Signed)
 Bib by EMS for worsening weakness for 2 days per wife. Baseline, patient walks with walker or uses a wheelchair. Patient is alert, awake and oriented to self. Reported by wife, patient collapsed today while going to the car assisted by wife. Denies LOC, does not take blood thinners.   BP=104/80 mmhg HR=80 BPM CBG+169 mg/dl

## 2023-10-18 NOTE — ED Provider Notes (Signed)
 Kevin Page EMERGENCY DEPARTMENT AT [facility] Provider Note   CSN: [id number] Arrival date & time: 10/18/23  1457     History  Chief Complaint  Patient presents with   Weakness    Kevin Page is a 89 y.o. male hx of dementia, hypertension, here presenting with weakness and fall.  Patient had a fall yesterday and hit his right shoulder and forearm. Patient has visiting nurse that came to evaluate and patient was complaining of right shoulder pain so patient has a scheduled appointment with his doctor today.  Wife was able to help him out of the house and while trying to get to the car, his legs gave out and he fell.  His wife was there and was able to catch him so he did not get any further injuries.  Patient was brought to the ER due to weakness.  Denies any fevers or chills.  Denies any chest pain.  Patient's tetanus is updated in 2021.  The history is provided by the patient.       Home Medications Prior to Admission medications   Medication Sig Start Date End Date Taking? Authorizing Provider  cholecalciferol (VITAMIN D3) 25 MCG (1000 UNIT) tablet Take 1,000 Units by mouth daily.    [provider]  diclofenac Sodium (VOLTAREN) 1 % GEL 2 g 4 (four) times daily as needed (pain). 05/20/21   [provider]  donepezil  (ARICEPT ) 10 MG tablet Take 10 mg by mouth at bedtime. 03/28/17   [provider]  dutasteride  (AVODART ) 0.5 MG capsule Take 0.5 mg by mouth at bedtime.    [provider]  guaiFENesin  (MUCINEX ) 600 MG 12 hr tablet Take 1 tablet (600 mg total) by mouth 2 (two) times daily. 01/25/21   Kevin Portland, MD  hydrocortisone  cream 1 % Apply topically 3 (three) times daily. 08/08/21   Kevin Page, Vijaya, MD  iron polysaccharides (NIFEREX) 150 MG capsule Take 150 mg by mouth daily. 09/12/21   [provider]  MYRBETRIQ  25 MG TB24 tablet Take 25 mg by mouth daily. 06/27/21   [provider]  OVER THE COUNTER MEDICATION Take 2  tablets by mouth daily. Eye supplement from Dr.Digby office ( Macular protection complete)    [provider]  pantoprazole  (PROTONIX ) 40 MG tablet Take 1 tablet (40 mg total) by mouth daily. 08/25/21 12/03/21  Kevin Page, Vijaya, MD  vitamin B-12 1000 MCG tablet Take 1 tablet (1,000 mcg total) by mouth daily. 08/08/21   Kevin Page, Vijaya, MD      Allergies    Patient has no known allergies.    Review of Systems   Review of Systems  Neurological:  Positive for weakness.  All other systems reviewed and are negative.   Physical Exam Updated Vital Signs BP 132/67   Pulse 67   Temp 98.3 F (36.8 C) (Oral)   Resp 19   Ht 6' (1.829 m)   Wt 65.8 kg   SpO2 97%   BMI 19.67 kg/m  Physical Exam Vitals and nursing note reviewed.  Constitutional:      Comments: Chronically ill and demented  HENT:     Head:     Comments: No obvious head injury    Nose: Nose normal.     Mouth/Throat:     Mouth: Mucous membranes are moist.  Eyes:     Extraocular Movements: Extraocular movements intact.     Pupils: Pupils are equal, round, and reactive to light.  Cardiovascular:     Rate  and Rhythm: Normal rate and regular rhythm.     Pulses: Normal pulses.     Heart sounds: Normal heart sounds.  Pulmonary:     Effort: Pulmonary effort is normal.     Breath sounds: Normal breath sounds.  Abdominal:     General: Abdomen is flat.     Palpations: Abdomen is soft.  Musculoskeletal:     Cervical back: Normal range of motion and neck supple.     Comments: Patient has an tear of the right forearm.  Patient also has tenderness of the right shoulder but able to range the shoulder and no obvious dislocation.  Patient has no elbow tenderness.  Patient has 1+ edema bilateral legs.  No other extremity trauma  Skin:    Capillary Refill: Capillary refill takes less than 2 seconds.  Neurological:     General: No focal deficit present.  Psychiatric:        Mood and Affect: Mood normal.        Behavior: Behavior  normal.     ED Results / Procedures / Treatments   Labs (all labs ordered are listed, but only abnormal results are displayed) Labs Reviewed  CBC WITH DIFFERENTIAL/PLATELET  COMPREHENSIVE METABOLIC PANEL WITH GFR  URINALYSIS, W/ REFLEX TO CULTURE (INFECTION SUSPECTED)  TROPONIN I (HIGH SENSITIVITY)    EKG None  Radiology No results found.  Procedures Procedures    Medications Ordered in ED Medications - No data to display  ED Course/ Medical Decision Making/ A&P                                 Medical Decision Making Kevin Page is a 89 y.o. male here with fall.  Patient had a fall several days ago and another fall today.  Patient has no head injury.  Patient does have some right shoulder pain and skin tear on the right forearm.  Plan to get labs to rule out electrolyte abnormality and UA to rule out UTI and will get shoulder and forearm x-rays.   7:12 PM Reviewed patient's labs and white blood cell count is elevated at 15.  Patient's troponin is flat from 40-43.  Patient is incontinent so In-N-Out cath was performed and patient has greater than 50 white blood cell count and also bacteria.  Patient's x-rays did not show any fracture.  At this point patient will be admitted for weakness from UTI.  Patient was given Rocephin in the ER and blood and urine culture was sent  Problems Addressed: Acute cystitis without hematuria: acute illness or injury Weakness: acute illness or injury  Amount and/or Complexity of Data Reviewed Labs: ordered. Decision-making details documented in ED Course. Radiology: ordered and independent interpretation performed. Decision-making details documented in ED Course. ECG/medicine tests: ordered and independent interpretation performed. Decision-making details documented in ED Course.  Risk Decision regarding hospitalization.    Final Clinical Impression(s) / ED Diagnoses Final diagnoses:  None    Rx / DC Orders ED Discharge Orders      None         Kevin Duck, MD 10/18/23 [phone]

## 2023-10-18 NOTE — H&P (Signed)
 History and Physical    Patient: Kevin Page:[id number] DOB: 1930/12/04 DOA: 10/18/2023 DOS: the patient was seen and examined on 10/18/2023 PCP: Jeannine Milroy., MD  Patient coming from: Home  Chief Complaint:  Chief Complaint  Patient presents with   Weakness   HPI: Kevin Page is a 89 y.o. male with medical history significant for dementia, peripheral arterial disease, GI bleed, BPH, macular degeneration, and htn who has been having right shoulder pain since having a fall a couple days ago.  The wife was taking him into his orthopedics appointment today.  He walks with a walker generally to the front steps.  He is able to hold on and walk down the steps independently then he resumes using the walker.  He did this and was heading out to the car when he collapsed.  He did not lose consciousness it looked more like his legs gave way.  The patient's wife was unable to get him up so she called EMS.  He was brought to the emergency department from his yard.  In the emergency department the patient was found to have multiple abrasions.  He was given an update to his tetanus shot and it was discovered that he may have a urinary tract infection.  The patient is incontinent and wears diapers all the time.  The wife reports he has not been complaining of any urinary discomfort.  Blood pressure the patient be admitted for IV antibiotics particular because of his fall.   Review of Systems: unable to review all systems due to the inability of the patient to answer questions. Past Medical History:  Diagnosis Date   Allergy    dust and mites   BPH (benign prostatic hyperplasia)    Essential hypertension    Hyperlipidemia    Macular degeneration    Osteoarthritis of both knees    Sinus bradycardia    SVT (supraventricular tachycardia) (HCC)    a. Event monitor 2014: few episodes of SVT, longest 12 beats with rate 128.   Syncope    Past Surgical History:  Procedure Laterality Date    CATARACT EXTRACTION W/ INTRAOCULAR LENS  IMPLANT, BILATERAL Bilateral 05/18/2009   COLONOSCOPY  2013   dupytron contractions  1998 , 2006   both hands   gi bleeding     LUMBAR FUSION  05/18/1992   Social History:  reports that he has quit smoking. He has never used smokeless tobacco. He reports current alcohol  use of about 12.0 standard drinks of alcohol  per week. He reports that he does not use drugs.  No Known Allergies  Family History  Problem Relation Age of Onset   Cancer Brother        tonsils   Heart attack Neg Hx     Prior to Admission medications   Medication Sig Start Date End Date Taking? Authorizing Provider  cholecalciferol (VITAMIN D3) 25 MCG (1000 UNIT) tablet Take 1,000 Units by mouth daily.   Yes [provider]  diclofenac Sodium (VOLTAREN) 1 % GEL 2-4 g 4 (four) times daily as needed (for pain). 05/20/21  Yes [provider]  donepezil  (ARICEPT ) 10 MG tablet Take 10 mg by mouth at bedtime. 03/28/17  Yes [provider]  dutasteride  (AVODART ) 0.5 MG capsule Take 0.5 mg by mouth at bedtime.   Yes [provider]  guaiFENesin  (MUCINEX ) 600 MG 12 hr tablet Take 1 tablet (600 mg total) by mouth 2 (two) times daily. Patient taking differently: Take 600 mg by  mouth in the morning. 01/25/21  Yes Lavaughn Portland, MD  Multiple Vitamins-Minerals (MACULAR VITAMIN BENEFIT PO) Take 1 capsule by mouth See admin instructions. Macular Protect- Take 1 capsule by mouth at bedtime   Yes [provider]  pantoprazole  (PROTONIX ) 40 MG tablet Take 1 tablet (40 mg total) by mouth daily. Patient taking differently: Take 40 mg by mouth daily before breakfast. 08/25/21 10/18/23 Yes Akula, Vijaya, MD  Psyllium (METAMUCIL PO) Take 2 tablets by mouth in the morning.   Yes [provider]  sodium chloride  (OCEAN) 0.65 % SOLN nasal spray Place 1 spray into both nostrils as needed for congestion.   Yes [provider]  hydrocortisone  cream 1 % Apply  topically 3 (three) times daily. Patient not taking: Reported on 10/18/2023 08/08/21   Akula, Vijaya, MD  vitamin B-12 1000 MCG tablet Take 1 tablet (1,000 mcg total) by mouth daily. Patient not taking: Reported on 10/18/2023 08/08/21   Feliciana Horn, MD    Physical Exam: Vitals:   10/18/23 1700 10/18/23 1730 10/18/23 1800 10/18/23 1917  BP: (!) 156/87 (!) 140/72 (!) 156/64   Pulse: 68 64 67   Resp:   19   Temp:    98.6 F (37 C)  TempSrc:    Oral  SpO2: 100% 96% 97%   Weight:      Height:       Physical Exam:  General: No acute distress, frail HEENT: Normocephalic, atraumatic, PERRL Cardiovascular: Normal rate and rhythm. Distal pulses intact. Pulmonary: Normal pulmonary effort, normal breath sounds Gastrointestinal: Nondistended abdomen, soft, non-tender, hypooactive bowel sounds Musculoskeletal:No lower ext edema Skin: Multiple abrasions.  The biggest ones are on his right arm and left shin Neuro: Hard of hearing, and attentive, seems to be the keeping his right upper extremity still. PSYCH: follows simple commands.  Not interactive but will talk if spoken to  Data Reviewed:    Assessment and Plan: Possible UTI Collapse - IV Rocephin  - PT/OT The patient is able to walk with walker.  3. Dementia - stable  4. Right shoulder pain post fall - Lots of cracking sounds with range of motion - Ortho follow up recommended - PT/OT for now  - Consider MRI   Advance Care Planning:   Code Status: Limited: Do not attempt resuscitation (DNR) -DNR-LIMITED -Do Not Intubate/DNI   The patient's wife is his healthcare power of attorney and she reports that he is DNR. Consults: None  Family Communication: Wife at bedside  Severity of Illness: The appropriate patient status for this patient is INPATIENT. Inpatient status is judged to be reasonable and necessary in order to provide the required intensity of service to ensure the patient's safety. The patient's presenting symptoms,  physical exam findings, and initial radiographic and laboratory data in the context of their chronic comorbidities is felt to place them at high risk for further clinical deterioration. Furthermore, it is not anticipated that the patient will be medically stable for discharge from the hospital within 2 midnights of admission.   * I certify that at the point of admission it is my clinical judgment that the patient will require inpatient hospital care spanning beyond 2 midnights from the point of admission due to high intensity of service, high risk for further deterioration and high frequency of surveillance required.*  Author: Willadean Hark, MD 10/18/2023 9:50 PM  For on call review [email or web address].

## 2023-10-19 ENCOUNTER — Observation Stay (HOSPITAL_COMMUNITY)

## 2023-10-19 DIAGNOSIS — S46811A Strain of other muscles, fascia and tendons at shoulder and upper arm level, right arm, initial encounter: Secondary | ICD-10-CM | POA: Diagnosis not present

## 2023-10-19 DIAGNOSIS — M25411 Effusion, right shoulder: Secondary | ICD-10-CM | POA: Diagnosis not present

## 2023-10-19 DIAGNOSIS — R531 Weakness: Secondary | ICD-10-CM | POA: Diagnosis not present

## 2023-10-19 DIAGNOSIS — M19011 Primary osteoarthritis, right shoulder: Secondary | ICD-10-CM | POA: Diagnosis not present

## 2023-10-19 DIAGNOSIS — M75121 Complete rotator cuff tear or rupture of right shoulder, not specified as traumatic: Secondary | ICD-10-CM | POA: Diagnosis not present

## 2023-10-19 DIAGNOSIS — N3000 Acute cystitis without hematuria: Secondary | ICD-10-CM | POA: Diagnosis not present

## 2023-10-19 LAB — CBC
HCT: 40.8 % (ref 39.0–52.0)
Hemoglobin: 13 g/dL (ref 13.0–17.0)
MCH: 30.7 pg (ref 26.0–34.0)
MCHC: 31.9 g/dL (ref 30.0–36.0)
MCV: 96.5 fL (ref 80.0–100.0)
Platelets: 278 10*3/uL (ref 150–400)
RBC: 4.23 MIL/uL (ref 4.22–5.81)
RDW: 13.2 % (ref 11.5–15.5)
WBC: 13.6 10*3/uL — ABNORMAL HIGH (ref 4.0–10.5)
nRBC: 0 % (ref 0.0–0.2)

## 2023-10-19 LAB — BASIC METABOLIC PANEL WITH GFR
Anion gap: 8 (ref 5–15)
BUN: 15 mg/dL (ref 8–23)
CO2: 22 mmol/L (ref 22–32)
Calcium: 8.6 mg/dL — ABNORMAL LOW (ref 8.9–10.3)
Chloride: 104 mmol/L (ref 98–111)
Creatinine, Ser: 0.79 mg/dL (ref 0.61–1.24)
GFR, Estimated: >60 mL/min (ref >60)
Glucose, Bld: 100 mg/dL — ABNORMAL HIGH (ref 70–99)
Potassium: 4.3 mmol/L (ref 3.5–5.1)
Sodium: 134 mmol/L — ABNORMAL LOW (ref 135–145)

## 2023-10-19 NOTE — Evaluation (Signed)
 Physical Therapy Evaluation Patient Details Name: Kevin Page MRN: [id number] DOB: Oct 22, 1930 Today's Date: 10/19/2023  History of Present Illness  Patient is a 89 year old male who presented after a fall with right shoulder and forearm pain. Patient was admitted with possible UTI, collapse, and R shoulder pain. PMH: dementia, macular degeneration, lumbar fusion, SVT.  Clinical Impression  Pt admitted with above diagnosis.  Pt pleasant and cooperative, wife present and providing pt PLOF; pt amb~ 20' with RW and min assist. Pt will benefit from HHPT at d/c.   Pt currently with functional limitations due to the deficits listed below (see PT Problem List). Pt will benefit from acute skilled PT to increase their independence and safety with mobility to allow discharge.           If plan is discharge home, recommend the following: A little help with walking and/or transfers;A little help with bathing/dressing/bathroom;Help with stairs or ramp for entrance;Assist for transportation;Assistance with cooking/housework;Supervision due to cognitive status   Can travel by private vehicle        Equipment Recommendations None recommended by PT  Recommendations for Other Services       Functional Status Assessment Patient has had a recent decline in their functional status and demonstrates the ability to make significant improvements in function in a reasonable and predictable amount of time.     Precautions / Restrictions Precautions Precautions: Fall Restrictions Weight Bearing Restrictions Per Provider Order: No      Mobility  Bed Mobility               General bed mobility comments: pt in recliner and returned to same    Transfers Overall transfer level: Needs assistance Equipment used: Rolling walker (2 wheels) Transfers: Sit to/from Stand Sit to Stand: Min assist           General transfer comment: cues to power up with LEs, assist to rise and transition to RW     Ambulation/Gait Ambulation/Gait assistance: Min assist Gait Distance (Feet): 20 Feet Assistive device: Rolling walker (2 wheels) Gait Pattern/deviations: Narrow base of support, Trunk flexed, Decreased step length - right, Decreased step length - left, Decreased dorsiflexion - right, Decreased dorsiflexion - left       General Gait Details: knees flexed throughout gait cycle; multi-modal cues for trunk extension, RW position from self and to incr step length  Stairs            Wheelchair Mobility     Tilt Bed    Modified Rankin (Stroke Patients Only)       Balance Overall balance assessment: Needs assistance Sitting-balance support: Feet supported, No upper extremity supported Sitting balance-Leahy Scale: Fair     Standing balance support: Reliant on assistive device for balance, During functional activity Standing balance-Leahy Scale: Poor Standing balance comment: light assist to maintain statio stand without UE support                             Pertinent Vitals/Pain Pain Assessment Pain Assessment: No/denies pain    Home Living Family/patient expects to be discharged to:: Private residence Living Arrangements: Spouse/significant other Available Help at Discharge: Family;Available 24 hours/day Type of Home: House Home Access: Stairs to enter Entrance Stairs-Rails: Right;Left;Can reach both Entrance Stairs-Number of Steps: 5   Home Layout: Two level;Able to live on main level with bedroom/bathroom Home Equipment: Rolling Walker (2 wheels);Shower seat;Grab bars - tub/shower;Rollator (4 wheels);Wheelchair - manual  Prior Function Prior Level of Function : Needs assist  Cognitive Assist : ADLs (cognitive)   ADLs (Cognitive): Step by step cues Physical Assist : ADLs (physical)   ADLs (physical): Toileting;Bathing;Dressing Mobility Comments: amb with RW at baseline; household distances mostly; transfers and bed mobility  supervision/CGA ADLs Comments: patient uses adult absorbent undergarments at home. patient is able to use RW to get to and from bathroom per wife and in and out of house. otherwise uses wheelchair. has aid that comes over on fridays to help with bathing.     Extremity/Trunk Assessment   Upper Extremity Assessment Upper Extremity Assessment: Defer to OT evaluation RUE Deficits / Details: pain in shoudle rwith FF to about 30 degrees. grip strength 3+/5 RUE: Shoulder pain with ROM;Unable to fully assess due to pain RUE Coordination: decreased gross motor LUE Deficits / Details: dequarvans syndrome with h/o multiple surgeries. index finger is contracted.grip strength 3+/5 LUE Coordination: decreased fine motor    Lower Extremity Assessment Lower Extremity Assessment: Generalized weakness;RLE deficits/detail;LLE deficits/detail RLE Coordination: decreased gross motor LLE Coordination: decreased gross motor    Cervical / Trunk Assessment Cervical / Trunk Assessment: Kyphotic  Communication   Communication Communication: No apparent difficulties    Cognition Arousal: Alert Behavior During Therapy: Flat affect   PT - Cognitive impairments: History of cognitive impairments                       PT - Cognition Comments: pleasant and cooperative; wife present Following commands: Intact       Cueing Cueing Techniques: Verbal cues, Gestural cues     General Comments      Exercises     Assessment/Plan    PT Assessment Patient needs continued PT services  PT Problem List Decreased strength;Decreased mobility;Decreased activity tolerance;Decreased balance;Decreased knowledge of use of DME;Decreased cognition;Decreased safety awareness       PT Treatment Interventions DME instruction;Gait training;Functional mobility training;Therapeutic activities;Patient/family education;Therapeutic exercise    PT Goals (Current goals can be found in the Care Plan section)  Acute  Rehab PT Goals Patient Stated Goal: to be able to transfer STS and pivot PT Goal Formulation: With family Time For Goal Achievement: 11/02/23 Potential to Achieve Goals: Good    Frequency Min 3X/week     Co-evaluation               AM-PAC PT "6 Clicks" Mobility  Outcome Measure Help needed turning from your back to your side while in a flat bed without using bedrails?: A Little Help needed moving from lying on your back to sitting on the side of a flat bed without using bedrails?: A Little Help needed moving to and from a bed to a chair (including a wheelchair)?: A Little Help needed standing up from a chair using your arms (e.g., wheelchair or bedside chair)?: A Little Help needed to walk in hospital room?: A Little Help needed climbing 3-5 steps with a railing? : A Lot 6 Click Score: 17    End of Session Equipment Utilized During Treatment: Gait belt Activity Tolerance: Patient tolerated treatment well;Patient limited by fatigue Patient left: in chair;with call bell/phone within reach;with family/visitor present;with chair alarm set   PT Visit Diagnosis: Other abnormalities of gait and mobility (R26.89)    Time: 1049-1100 PT Time Calculation (min) (ACUTE ONLY): 11 min   Charges:   PT Evaluation $PT Eval Low Complexity: 1 Low   PT General Charges $$ ACUTE PT VISIT: 1 Visit  Alexandria Ida, PT  Acute Rehab Dept [facility]) [phone]  10/19/2023   [facility] 10/19/2023, 11:48 AM

## 2023-10-19 NOTE — TOC Initial Note (Addendum)
 Transition of Care [facility]) - Initial/Assessment Note    Patient Details  Name: Kevin Page MRN: [id number] Date of Birth: Dec 13, 1930  Transition of Care [facility]) CM/SW Contact:    Bari Leys, RN Phone Number: 10/19/2023, 2:59 PM  Clinical Narrative:  Met with patietn and spouse at bedside to introduce role of TOC/NCM and review for dc planning, spouse answered assessment questions, reports no current home care services, home DME: RW, wheelchair, shower chair,r resides in private residence with spouse. PT eva rec for [facility] PT/OT, spouse agreeable, reports has used [facility] in the past, agreeable to use again if referral accepted.  -3:05pm referral to [facility], rep-Angela, accepted for [facility] PT/OT, added to AVS.                   Expected Discharge Plan: Home w Home Health Services Barriers to Discharge: Continued Medical Work up   Patient Goals and CMS Choice Patient states their goals for this hospitalization and ongoing recovery are:: return home CMS Medicare.gov Compare Post Acute Care list provided to:: Patient Represenative (must comment) Valor, Quaintance (Spouse)  [phone] (Mobile)) Choice offered to / list presented to : Spouse [place] ownership interest in [facility].provided to:: Spouse    Expected Discharge Plan and Services       Living arrangements for the past 2 months: Single Family Home                                      Prior Living Arrangements/Services Living arrangements for the past 2 months: Single Family Home Lives with:: Spouse Patient language and need for interpreter reviewed:: Yes Do you feel safe going back to the place where you live?: Yes      Need for Family Participation in Patient Care: Yes (Comment) Care giver support system in place?: Yes (comment) Current home services: DME (RW, wheelchair, shower chair) Criminal Activity/Legal Involvement Pertinent to Current Situation/Hospitalization: No - Comment as  needed  Activities of Daily Living   ADL Screening (condition at time of admission) Independently performs ADLs?: Yes (appropriate for developmental age) Is the patient deaf or have difficulty hearing?: No Does the patient have difficulty seeing, even when wearing glasses/contacts?: Yes Does the patient have difficulty concentrating, remembering, or making decisions?: Yes  Permission Sought/Granted                  Emotional Assessment Appearance:: Appears stated age Attitude/Demeanor/Rapport: Gracious Affect (typically observed): Accepting Orientation: : Oriented to Self, Oriented to Place, Oriented to  Time, Oriented to Situation Alcohol  / Substance Use: Not Applicable Psych Involvement: No (comment)  Admission diagnosis:  UTI (urinary tract infection) [N39.0] Weakness [R53.1] Acute cystitis without hematuria [N30.00] Patient Active Problem List   Diagnosis Date Noted   UTI (urinary tract infection) 10/18/2023   Chronic venous insufficiency 10/18/2023   PAD (peripheral artery disease) (HCC) 10/18/2023   Compression fracture of thoracic vertebra (HCC) 01/07/2022   Low back pain 12/17/2021   Abnormal EKG 10/07/2021   Expressive aphasia 10/07/2021   Iron deficiency anemia due to chronic blood loss 10/07/2021   TIA (transient ischemic attack) 10/07/2021   Transient alteration of awareness 10/07/2021   Physical deconditioning 08/02/2021   GI bleed 08/01/2021   Anemia, GI Bleed 07/31/2021   B12 deficiency 07/31/2021   Elevated troponin 07/31/2021   Dysphagia 03/30/2021   Disequilibrium 03/30/2021   Overflow incontinence of urine 03/30/2021   Diverticular  hemorrhage    Rectal bleeding 01/23/2021   Painless rectal bleeding 01/22/2021   Dementia (HCC) 01/22/2021   Abnormal gait 10/12/2019   Palpitations 06/13/2019   SVT (supraventricular tachycardia) (HCC)    Sinus bradycardia    Osteoarthritis of both knees    Macular degeneration    Allergy    Advanced dry  age-related macular degeneration of both eyes with subfoveal involvement 03/03/2016   Hypertensive retinopathy of both eyes 03/03/2016   Pseudophakia of both eyes 03/03/2016   PVD (posterior vitreous detachment), both eyes 03/03/2016   Orthostasis 01/19/2016   Dilated aortic root (HCC) 01/19/2016   PSVT (paroxysmal supraventricular tachycardia) (HCC) 01/19/2016   Mild aortic insufficiency 01/19/2016   Sinus bradycardia seen on cardiac monitor 01/18/2016   Postnasal drip 11/05/2014   Age-related osteoporosis without current pathological fracture 11/13/2013   Incontinence of feces 11/01/2013   Tobacco user 11/01/2013   Nicotine dependence, unspecified, in remission 11/01/2013   Bradycardia 01/30/2013   Syncope and collapse 01/12/2013   BPH (benign prostatic hyperplasia) 01/12/2013   Personal history of transient ischemic attack (TIA), and cerebral infarction without residual deficits 01/13/2012   History of stroke without residual deficits 01/13/2012   Osteoarthritis 05/21/2011   Lower urinary tract symptoms due to benign prostatic hyperplasia 05/21/2011   PCP:  Jeannine Milroy., MD Pharmacy:   [facility] # [address] [place] - [address] [place] [address] Phone: [phone] Fax: [phone]     Social Drivers of Health (SDOH) Social History: SDOH Screenings   Food Insecurity: Patient Unable To Answer (10/18/2023)  Housing: Patient Unable To Answer (10/18/2023)  Tobacco Use: Medium Risk (10/18/2023)   SDOH Interventions:     Readmission Risk Interventions    10/19/2023    2:57 PM  Readmission Risk Prevention Plan  Post Dischage Appt Complete  Medication Screening Complete  Transportation Screening Complete

## 2023-10-19 NOTE — Progress Notes (Signed)
  Progress Note   Patient: Kevin Page ZOX:[id number] DOB: 1930/10/19 DOA: 10/18/2023     1 DOS: the patient was seen and examined on 10/19/2023   Brief hospital course: 89 y.o. male with medical history significant for dementia, peripheral arterial disease, GI bleed, BPH, macular degeneration, and htn who has been having right shoulder pain since having a fall a couple days ago.  The wife was taking him into his orthopedics appointment today.  He walks with a walker generally to the front steps.  He is able to hold on and walk down the steps independently then he resumes using the walker.  He did this and was heading out to the car when he collapsed.  He did not lose consciousness it looked more like his legs gave way.  The patient's wife was unable to get him up so she called EMS.  He was brought to the emergency department from his yard.  In the emergency department the patient was found to have multiple abrasions.  He was given an update to his tetanus shot and it was discovered that he may have a urinary tract infection.  The patient is incontinent and wears diapers all the time.  The wife reports he has not been complaining of any urinary discomfort.  Blood pressure the patient be admitted for IV antibiotics particular because of his fall.    Assessment and Plan: UTI with fall -Presenting UA suggestive of UTI with urine cx pending -Presented with WBC of 15k, now normalized -Blood cx neg thus far -continued on empiric rocephin  Dementia -seems stable and around baseline at this time  R shoulder pain s/p fall -Plain film xray reviewed, no fractures noted -MRI shoulder was ordered, pending -Recs for HHPT/OT on d/c      Subjective: Reporting feeling better  Physical Exam: Vitals:   10/19/23 0109 10/19/23 0530 10/19/23 0932 10/19/23 1357  BP: (!) 155/78 (!) 142/73 129/86 107/71  Pulse: 76 72 74 84  Resp: 17 17 17 17   Temp: 98.9 F (37.2 C) 98.9 F (37.2 C) 98.2 F (36.8 C) 98.4 F  (36.9 C)  TempSrc: Oral Oral    SpO2: 93% 94% 93% 96%  Weight:      Height:       General exam: Awake, laying in bed, in nad Respiratory system: Normal respiratory effort, no wheezing Cardiovascular system: regular rate, s1, s2 Gastrointestinal system: Soft, nondistended, positive BS Central nervous system: CN2-12 grossly intact, strength intact Extremities: Perfused, no clubbing Skin: Normal skin turgor, no notable skin lesions seen Psychiatry: Mood normal // no visual hallucinations   Data Reviewed:  Labs reviewed: Na 134, K 4.3, Cr 0.79, WBC 13.6, Hgb 13.0, Plts 278  Family Communication: Pt in room, family at bedside  Disposition: Status is: Inpatient Will require <2 midnight stay, thus will change to obs  Planned Discharge Destination: Home    Author: Cherylle Corwin, MD 10/19/2023 3:36 PM  For on call review [email or web address].

## 2023-10-19 NOTE — Evaluation (Signed)
 Occupational Therapy Evaluation Patient Details Name: Kevin Page MRN: [id number] DOB: 03/14/1931 Today's Date: 10/19/2023   History of Present Illness   Patient is a 89 year old male who presented after a fall with right shoulder and forearm pain. Patient was admitted with possible UTI, collapse, and R shoulder pain. PMH: dementia, macular degeneration, lumbar fusion, SVT.     Clinical Impressions Patient is a 89 year old male who was admitted for above. Patient was living at home with caregiver support from wife and aid that comes on Friday to help with bathing. Patient has five steps he needs to be able to walk up with bilateral hand rails to get back home. Patient was noted to have decreased functional activity tolerance, decreased endurance, decreased standing balance, decreased safety awareness, and decreased knowledge of AD/AE impacting participation in ADLs. Plan is to d/c home with wife and caregiver support with [facility] when medically stable.      If plan is discharge home, recommend the following:   A lot of help with bathing/dressing/bathroom;Assistance with cooking/housework;Direct supervision/assist for medications management;A lot of help with walking and/or transfers;Direct supervision/assist for financial management;Assist for transportation;Help with stairs or ramp for entrance;Supervision due to cognitive status     Functional Status Assessment   Patient has had a recent decline in their functional status and demonstrates the ability to make significant improvements in function in a reasonable and predictable amount of time.     Equipment Recommendations   None recommended by OT      Precautions/Restrictions   Precautions Precautions: Fall Restrictions Weight Bearing Restrictions Per Provider Order: No     Mobility Bed Mobility Overal bed mobility: Needs Assistance Bed Mobility: Supine to Sit     Supine to sit: Mod assist, HOB elevated, Used rails                        Balance Overall balance assessment: Mild deficits observed, not formally tested           ADL either performed or assessed with clinical judgement   ADL Overall ADL's : Needs assistance/impaired Eating/Feeding: Supervision/ safety;Set up;Sitting   Grooming: Sitting;Moderate assistance;Brushing hair;Oral care;Wash/dry face Grooming Details (indicate cue type and reason): patient was able to brush teeth with set up of task. patient needed max A to brush hair with patient indicating he does it at home but wife indicating that she does it really. Upper Body Bathing: Sitting;Moderate assistance   Lower Body Bathing: Sitting/lateral leans;Total assistance   Upper Body Dressing : Sitting;Moderate assistance   Lower Body Dressing: Sitting/lateral leans;Total assistance Lower Body Dressing Details (indicate cue type and reason): patient reported he puts his own shoes on. wife indicated he does not she does it. patient unable to don shoes. Toilet Transfer: Moderate assistance;Stand-pivot;Rolling walker (2 wheels) Toilet Transfer Details (indicate cue type and reason): to recliner in room with increased time and cues for leaning nose over toes to power up. Toileting- Architect and Hygiene: Sit to/from stand;Total assistance Toileting - Clothing Manipulation Details (indicate cue type and reason): unaware of BM smear. wife reported that he wears adult absorbent undergarments at home             Vision Baseline Vision/History: 1 Wears glasses              Pertinent Vitals/Pain Pain Assessment Pain Assessment: No/denies pain     Extremity/Trunk Assessment Upper Extremity Assessment Upper Extremity Assessment: LUE deficits/detail;RUE deficits/detail RUE Deficits / Details:  pain in shoudle rwith FF to about 30 degrees. grip strength 3+/5 RUE: Shoulder pain with ROM;Unable to fully assess due to pain RUE Coordination: decreased gross motor LUE  Deficits / Details: dequarvans syndrome with h/o multiple surgeries. index finger is contracted.grip strength 3+/5 LUE Coordination: decreased fine motor   Lower Extremity Assessment Lower Extremity Assessment: Defer to PT evaluation   Cervical / Trunk Assessment Cervical / Trunk Assessment: Kyphotic      Cognition Arousal: Alert Behavior During Therapy: Flat affect Cognition: History of cognitive impairments             OT - Cognition Comments: appears to be at baseline cognitively per wife.                 Following commands: Intact       Cueing  General Comments   Cueing Techniques: Verbal cues              Home Living Family/patient expects to be discharged to:: Private residence Living Arrangements: Spouse/significant other Available Help at Discharge: Family;Available 24 hours/day Type of Home: House Home Access: Stairs to enter [employer] of Steps: 5 Entrance Stairs-Rails: Right;Left;Can reach both Home Layout: Two level;Able to live on main level with bedroom/bathroom     Bathroom Shower/Tub: Walk-in shower         Home Equipment: Agricultural consultant (2 wheels);Shower seat;Grab bars - tub/shower;Rollator (4 wheels);Wheelchair - manual          Prior Functioning/Environment Prior Level of Function : Needs assist  Cognitive Assist : ADLs (cognitive)   ADLs (Cognitive): Step by step cues Physical Assist : ADLs (physical)   ADLs (physical): Toileting;Bathing;Dressing   ADLs Comments: patient uses adult absorbent undergarments at home. patient is able to use RW to get to and from bathroom per wife and in and out of house. otherwise uses wheelchair. has aid that comes over on fridays to help with bathing.    OT Problem List: Impaired balance (sitting and/or standing);Impaired UE functional use;Decreased knowledge of use of DME or AE;Decreased activity tolerance;Decreased safety awareness;Decreased knowledge of precautions;Decreased  cognition;Pain   OT Treatment/Interventions: Self-care/ADL training;DME and/or AE instruction;Therapeutic activities;Balance training;Energy conservation;Patient/family education      OT Goals(Current goals can be found in the care plan section)   Acute Rehab OT Goals Patient Stated Goal: none stated OT Goal Formulation: With patient/family Time For Goal Achievement: 11/02/23 Potential to Achieve Goals: Fair   OT Frequency:  Min 2X/week       AM-PAC OT "6 Clicks" Daily Activity     Outcome Measure Help from another person eating meals?: A Little Help from another person taking care of personal grooming?: A Lot Help from another person toileting, which includes using toliet, bedpan, or urinal?: A Lot Help from another person bathing (including washing, rinsing, drying)?: A Lot Help from another person to put on and taking off regular upper body clothing?: A Little Help from another person to put on and taking off regular lower body clothing?: A Lot 6 Click Score: 14   End of Session Equipment Utilized During Treatment: Gait belt;Rolling walker (2 wheels) Nurse Communication: Other (comment) (patients condom cath coming off, and transfer status.)  Activity Tolerance: Patient tolerated treatment well Patient left: in chair;with call bell/phone within reach;with chair alarm set;with family/visitor present  OT Visit Diagnosis: Unsteadiness on feet (R26.81);Other abnormalities of gait and mobility (R26.89);Muscle weakness (generalized) (M62.81);Pain                Time:  1610-9604 OT Time Calculation (min): 33 min Charges:  OT General Charges $OT Visit: 1 Visit OT Evaluation $OT Eval Moderate Complexity: 1 Mod OT Treatments $Self Care/Home Management : 8-22 mins  Wynette Heckler, MS Acute Rehabilitation Department Office# [phone]   Jame Maze 10/19/2023, 10:24 AM

## 2023-10-19 NOTE — Hospital Course (Signed)
 89 y.o. male with medical history significant for dementia, peripheral arterial disease, GI bleed, BPH, macular degeneration, and htn who has been having right shoulder pain since having a fall a couple days ago.  The wife was taking him into his orthopedics appointment today.  He walks with a walker generally to the front steps.  He is able to hold on and walk down the steps independently then he resumes using the walker.  He did this and was heading out to the car when he collapsed.  He did not lose consciousness it looked more like his legs gave way.  The patient's wife was unable to get him up so she called EMS.  He was brought to the emergency department from his yard.  In the emergency department the patient was found to have multiple abrasions.  He was given an update to his tetanus shot and it was discovered that he may have a urinary tract infection.  The patient is incontinent and wears diapers all the time.  The wife reports he has not been complaining of any urinary discomfort.  Blood pressure the patient be admitted for IV antibiotics particular because of his fall.

## 2023-10-20 DIAGNOSIS — N3000 Acute cystitis without hematuria: Secondary | ICD-10-CM | POA: Diagnosis not present

## 2023-10-20 DIAGNOSIS — G8929 Other chronic pain: Secondary | ICD-10-CM | POA: Diagnosis not present

## 2023-10-20 DIAGNOSIS — M25511 Pain in right shoulder: Secondary | ICD-10-CM | POA: Diagnosis not present

## 2023-10-20 DIAGNOSIS — F0390 Unspecified dementia without behavioral disturbance: Secondary | ICD-10-CM | POA: Diagnosis not present

## 2023-10-20 LAB — COMPREHENSIVE METABOLIC PANEL WITH GFR
ALT: 14 U/L (ref 0–44)
AST: 34 U/L (ref 15–41)
Albumin: 3.3 g/dL — ABNORMAL LOW (ref 3.5–5.0)
Alkaline Phosphatase: 82 U/L (ref 38–126)
Anion gap: 8 (ref 5–15)
BUN: 17 mg/dL (ref 8–23)
CO2: 24 mmol/L (ref 22–32)
Calcium: 8.4 mg/dL — ABNORMAL LOW (ref 8.9–10.3)
Chloride: 104 mmol/L (ref 98–111)
Creatinine, Ser: 0.75 mg/dL (ref 0.61–1.24)
GFR, Estimated: >60 mL/min (ref >60)
Glucose, Bld: 94 mg/dL (ref 70–99)
Potassium: 3.7 mmol/L (ref 3.5–5.1)
Sodium: 136 mmol/L (ref 135–145)
Total Bilirubin: 0.9 mg/dL (ref 0.0–1.2)
Total Protein: 6.1 g/dL — ABNORMAL LOW (ref 6.5–8.1)

## 2023-10-20 LAB — CBC
HCT: 38.8 % — ABNORMAL LOW (ref 39.0–52.0)
Hemoglobin: 12.6 g/dL — ABNORMAL LOW (ref 13.0–17.0)
MCH: 30.8 pg (ref 26.0–34.0)
MCHC: 32.5 g/dL (ref 30.0–36.0)
MCV: 94.9 fL (ref 80.0–100.0)
Platelets: 286 10*3/uL (ref 150–400)
RBC: 4.09 MIL/uL — ABNORMAL LOW (ref 4.22–5.81)
RDW: 13.2 % (ref 11.5–15.5)
WBC: 8.5 10*3/uL (ref 4.0–10.5)
nRBC: 0 % (ref 0.0–0.2)

## 2023-10-20 LAB — URINE CULTURE: Culture: >=100000 — AB

## 2023-10-20 MED ORDER — DUTASTERIDE 0.5 MG PO CAPS: 0.5000 mg | ORAL_CAPSULE | Freq: Every day | ORAL | Status: DC

## 2023-10-20 MED ORDER — CEFADROXIL 500 MG PO CAPS
500.0000 mg | ORAL_CAPSULE | Freq: Two times a day (BID) | ORAL | Status: DC
  Administered 2023-10-20: 500 mg via ORAL
  Filled 2023-10-20: qty 1

## 2023-10-20 MED ORDER — CEFADROXIL 500 MG PO CAPS: 500.0000 mg | ORAL_CAPSULE | Freq: Two times a day (BID) | ORAL | 0 refills | Status: AC

## 2023-10-20 MED ORDER — GUAIFENESIN ER 600 MG PO TB12
600.0000 mg | ORAL_TABLET | Freq: Every morning | ORAL | Status: DC
  Administered 2023-10-20: 600 mg via ORAL
  Filled 2023-10-20: qty 1

## 2023-10-20 MED ORDER — VITAMIN D 25 MCG (1000 UNIT) PO TABS
1000.0000 [IU] | ORAL_TABLET | Freq: Every day | ORAL | Status: DC
  Administered 2023-10-20: 1000 [IU] via ORAL
  Filled 2023-10-20: qty 1

## 2023-10-20 MED ORDER — PANTOPRAZOLE SODIUM 40 MG PO TBEC: 40.0000 mg | DELAYED_RELEASE_TABLET | Freq: Every day | ORAL | Status: DC

## 2023-10-20 MED ORDER — PANTOPRAZOLE SODIUM 40 MG PO TBEC
40.0000 mg | DELAYED_RELEASE_TABLET | Freq: Every day | ORAL | Status: DC
  Administered 2023-10-20: 40 mg via ORAL
  Filled 2023-10-20: qty 1

## 2023-10-20 MED ORDER — DONEPEZIL HCL 10 MG PO TABS: 10.0000 mg | ORAL_TABLET | Freq: Every day | ORAL | Status: DC

## 2023-10-20 MED ORDER — PSYLLIUM 95 % PO PACK
1.0000 | PACK | Freq: Every day | ORAL | Status: DC
  Administered 2023-10-20: 1 via ORAL
  Filled 2023-10-20: qty 1

## 2023-10-20 NOTE — Progress Notes (Signed)
 Occupational Therapy Treatment Patient Details Name: Kevin Page MRN: [id number] DOB: December 20, 1930 Today's Date: 10/20/2023   History of present illness Patient is a 89 year old male who presented after a fall with right shoulder and forearm pain. Patient was admitted with possible UTI, collapse, and R shoulder pain. PMH: dementia, macular degeneration, lumbar fusion, SVT.   OT comments  Patient was noted to need less A for transfers on this date with consistent amount of cues for safety. Plan is to d/c home with wife and caregiver support when medially stable.Patient's discharge plan remains appropriate at this time. OT will continue to follow acutely.        If plan is discharge home, recommend the following:  A lot of help with bathing/dressing/bathroom;Assistance with cooking/housework;Direct supervision/assist for medications management;Direct supervision/assist for financial management;Assist for transportation;Help with stairs or ramp for entrance;Supervision due to cognitive status;A little help with walking and/or transfers   Equipment Recommendations  None recommended by OT       Precautions / Restrictions Precautions Precautions: Fall Restrictions Weight Bearing Restrictions Per Provider Order: No       Mobility Bed Mobility Overal bed mobility: Needs Assistance Bed Mobility: Supine to Sit     Supine to sit: Contact guard             Balance Overall balance assessment: Needs assistance Sitting-balance support: Feet supported, No upper extremity supported Sitting balance-Leahy Scale: Fair     Standing balance support: Reliant on assistive device for balance, During functional activity Standing balance-Leahy Scale: Poor         ADL either performed or assessed with clinical judgement   ADL Overall ADL's : Needs assistance/impaired     Grooming: Sitting;Moderate assistance;Brushing hair;Oral care;Wash/dry face Grooming Details (indicate cue type and  reason): patient was able to brush teeth with set up of task. patient needed max A to brush hair with patient indicating he does it at home but wife indicating that she does it really. patient able to engage in shaving face seated in chair with increased time and cues for getting areas needed. wife reported she typically has to follow behind and finish task.                 Toilet Transfer: Contact guard assist;Stand-pivot;Rolling walker (2 wheels) Toilet Transfer Details (indicate cue type and reason): to recliner with cues for sequencing and positioning                  Cognition Arousal: Alert Behavior During Therapy: Flat affect Cognition: History of cognitive impairments             OT - Cognition Comments: appears to be at baseline cognitively per wife.                                        Pertinent Vitals/ Pain       Pain Assessment Pain Assessment: No/denies pain         Frequency  Min 2X/week        Progress Toward Goals  OT Goals(current goals can now be found in the care plan section)  Progress towards OT goals: Progressing toward goals     Plan         AM-PAC OT "6 Clicks" Daily Activity     Outcome Measure   Help from another person eating meals?: A Little Help from another person  taking care of personal grooming?: A Lot Help from another person toileting, which includes using toliet, bedpan, or urinal?: A Lot Help from another person bathing (including washing, rinsing, drying)?: A Lot Help from another person to put on and taking off regular upper body clothing?: A Little Help from another person to put on and taking off regular lower body clothing?: A Lot 6 Click Score: 14    End of Session Equipment Utilized During Treatment: Gait belt;Rolling walker (2 wheels)  OT Visit Diagnosis: Unsteadiness on feet (R26.81);Other abnormalities of gait and mobility (R26.89);Muscle weakness (generalized) (M62.81);Pain    Activity Tolerance Patient tolerated treatment well   Patient Left in chair;with call bell/phone within reach;with chair alarm set;with family/visitor present   Nurse Communication          Time: [phone] OT Time Calculation (min): 13 min  Charges: OT General Charges $OT Visit: 1 Visit OT Treatments $Self Care/Home Management : 8-22 mins  Wynette Heckler, MS Acute Rehabilitation Department Office# [phone]   Jame Maze 10/20/2023, 10:47 AM

## 2023-10-20 NOTE — Discharge Summary (Signed)
 Physician Discharge Summary  Kevin Page OVF:[id number] DOB: May 24, 1930 DOA: 10/18/2023  PCP: Jeannine Milroy., MD  Admit date: 10/18/2023 Discharge date: 10/20/2023  Time spent: 60 minutes  Recommendations for Outpatient Follow-up:  Follow-up with Jeannine Milroy., MD in 2 weeks.   Discharge Diagnoses:  Principal Problem:   UTI (urinary tract infection) Active Problems:   Dementia (HCC)   Chronic right shoulder pain   Discharge Condition: Stable and improved  Diet recommendation: Regular  Filed Weights   10/18/23 1531  Weight: 65.8 kg    History of present illness:  HPI per Dr. Luwanna Page is a 89 y.o. male with medical history significant for dementia, peripheral arterial disease, GI bleed, BPH, macular degeneration, and htn who has been having right shoulder pain since having a fall a couple days ago.  The wife was taking him into his orthopedics appointment today.  He walks with a walker generally to the front steps.  He is able to hold on and walk down the steps independently then he resumes using the walker.  He did this and was heading out to the car when he collapsed.  He did not lose consciousness it looked more like his legs gave way.  The patient's wife was unable to get him up so she called EMS.  He was brought to the emergency department from his yard.  In the emergency department the patient was found to have multiple abrasions.  He was given an update to his tetanus shot and it was discovered that he may have a urinary tract infection.  The patient is incontinent and wears diapers all the time.  The wife reports he has not been complaining of any urinary discomfort.  Blood pressure the patient be admitted for IV antibiotics particular because of his fall.   Hospital Course:  #1 UTI with fall -Patient presented with fall, urinalysis concerning for UTI suggestive of a urinary tract infection. - Patient also noted to have a leukocytosis on admission with a  white count of 15,000. - Urine cultures obtained with > 100,000 colonies of E. coli. - Patient was placed on IV Rocephin and cultures sensitive to cephalosporins. - Patient improved clinically, leukocytosis trended down and had resolved by day of discharge. - Patient remained afebrile. - Patient transition to Duricef and will be discharged on 6 more days of Duricef to complete a course of antibiotic treatment. - Outpatient follow-up with PCP.  2.  Dementia -Remained stable during the hospitalization. - Outpatient follow-up.  3.  Right shoulder pain status post fall -Plain films of the right shoulder then negative for any acute fractures. - Patient underwent MRI of the right shoulder with showed severe atrophy of supraspinatus and infraspinatus muscles with complete full-thickness retracted tears of the supraspinatus and infraspinatus tendons and high riding humeral head.  Severe degenerative changes of the glenohumeral joint with large glenohumeral joint effusion and synovitis.  Moderate arthropathy of the acromioclavicular joint.  1.6 cm cystic lesion in the subcutaneous fat of the posterior right shoulder may represent an epidermal inclusion cyst.  Intra-articular biceps tendon not visualized and likely torn and retracted. - Patient noted to be following orthopedics in the outpatient setting. - Outpatient follow-up as previously scheduled.  The rest of patient's chronic medical issues remained stable throughout the hospitalization.  Procedures: Plain films of the right shoulder 10/18/2023 Plain films of the right forearm 10/18/2023 Chest x-ray 10/18/2023 MRI right shoulder 10/19/2023  Consultations: None  Discharge Exam: Vitals:  10/20/23 0505 10/20/23 1117  BP: (!) 160/85 131/73  Pulse: 66 77  Resp: 17 14  Temp: 98.2 F (36.8 C) 97.9 F (36.6 C)  SpO2: 98% 97%    General: NAD Cardiovascular: RRR no murmurs rubs or gallops.  No JVD.  No lower extremity edema. Respiratory: Clear  to auscultation bilaterally.  No wheezes, no crackles, no rhonchi.  Fair air movement.  Speaking in full sentences.  Discharge Instructions   Discharge Instructions     Diet general   Complete by: As directed    Increase activity slowly   Complete by: As directed    No wound care   Complete by: As directed       Allergies as of 10/20/2023   No Known Allergies      Medication List     STOP taking these medications    cyanocobalamin  1000 MCG tablet   hydrocortisone  cream 1 %       TAKE these medications    cefadroxil 500 MG capsule Commonly known as: DURICEF Take 1 capsule (500 mg total) by mouth 2 (two) times daily for 6 days.   cholecalciferol 25 MCG (1000 UNIT) tablet Commonly known as: VITAMIN D3 Take 1,000 Units by mouth daily.   diclofenac Sodium 1 % Gel Commonly known as: VOLTAREN 2-4 g 4 (four) times daily as needed (for pain).   donepezil  10 MG tablet Commonly known as: ARICEPT  Take 10 mg by mouth at bedtime.   dutasteride  0.5 MG capsule Commonly known as: AVODART  Take 0.5 mg by mouth at bedtime.   guaiFENesin  600 MG 12 hr tablet Commonly known as: MUCINEX  Take 1 tablet (600 mg total) by mouth 2 (two) times daily. What changed: when to take this   MACULAR VITAMIN BENEFIT PO Take 1 capsule by mouth See admin instructions. Macular Protect- Take 1 capsule by mouth at bedtime   METAMUCIL PO Take 2 tablets by mouth in the morning.   pantoprazole  40 MG tablet Commonly known as: PROTONIX  Take 1 tablet (40 mg total) by mouth daily. What changed: when to take this   sodium chloride  0.65 % Soln nasal spray Commonly known as: OCEAN Place 1 spray into both nostrils as needed for congestion.       No Known Allergies  Follow-up Information     [facility], [place] Follow up.   Why: [facility] Physical Therapy/Occupational Therapy Contact information: [address]  [address] [place] [address] [phone]         Jeannine Milroy., MD. Schedule an appointment as soon as possible for a visit in 2 week(s).   Specialty: Internal Medicine Contact information: [address] [place] [address] [phone]                  The results of significant diagnostics from this hospitalization (including imaging, microbiology, ancillary and laboratory) are listed below for reference.    Significant Diagnostic Studies: MR SHOULDER RIGHT WO CONTRAST Result Date: 10/19/2023 CLINICAL DATA:  Fall. EXAM: MRI OF THE RIGHT SHOULDER WITHOUT CONTRAST TECHNIQUE: Multiplanar, multisequence MR imaging of the shoulder was performed. No intravenous contrast was administered. COMPARISON:  Right shoulder radiograph dated 10/18/2023. FINDINGS: Evaluation is significantly compromised due to motion degradation and patient condition. Rotator cuff: Severe atrophy of the supraspinatus and infraspinatus muscles with complete full-thickness retracted tears of the supraspinatus and infraspinatus tendons. Biceps Long Head: Intraarticular biceps tendon is not visualized and  likely torn and retracted. Acromioclavicular Joint: Moderate arthropathy of the acromioclavicular joint. Fluid within the subacromial/subdeltoid bursa, secondary to complete retracted rotator cuff tears described above. Glenohumeral Joint: High-riding humeral head. Large glenohumeral joint effusion with synovitis. Severe degenerative changes of the glenohumeral joint with joint space narrowing and osteophytosis. Labrum: Not well evaluated on this exam. Bones: No evidence of acute fracture. Other: A 1.6 x 0.9 x 1.1 cm T2 hyperintense cystic lesion within the subcutaneous fat of the posterior right shoulder may represent an epidermal inclusion cyst. IMPRESSION: Evaluation is significantly compromised due to motion degradation and patient condition. 1. Severe atrophy of the supraspinatus and infraspinatus muscles with  complete full-thickness retracted tears of the supraspinatus and infraspinatus tendons and high-riding humeral head. 2. Intraarticular biceps tendon is not visualized and likely torn and retracted. 3. Severe degenerative changes of the glenohumeral joint with large glenohumeral joint effusion and synovitis. 4. Moderate arthropathy of the acromioclavicular joint. 5. A 1.6 cm cystic lesion in the subcutaneous fat of the posterior right shoulder may represent an epidermal inclusion cyst. Electronically Signed   By: Mannie Seek M.D.   On: 10/19/2023 18:17   DG Shoulder Right Port Result Date: 10/18/2023 CLINICAL DATA:  Fall EXAM: RIGHT SHOULDER - 1 VIEW COMPARISON:  None Available. FINDINGS: There is no acute fracture or dislocation identified. There is complete loss of subacromial joint space with remodeling of the acromion. There narrowing of the glenohumeral joint space and osteophyte formation as well. Soft tissue calcifications adjacent greater tuberosity are related to the tissues are otherwise normal limits. IMPRESSION: 1. No acute fracture or dislocation. 2. Severe degenerative changes of the right shoulder. Electronically Signed   By: Tyron Gallon M.D.   On: 10/18/2023 17:41   DG Chest Port 1 View Result Date: 10/18/2023 CLINICAL DATA:  Weakness. EXAM: PORTABLE CHEST 1 VIEW COMPARISON:  07/31/2021 FINDINGS: Unchanged elevation of left hemidiaphragm with adjacent basilar atelectasis or scarring. Stable heart size and mediastinal contours. Aortic atherosclerosis. No focal airspace disease, large pleural effusion or pneumothorax. Chronic shoulder arthropathy. Right shoulder better assessed on concurrent shoulder exam. IMPRESSION: Unchanged elevation of left hemidiaphragm with adjacent basilar atelectasis or scarring. Electronically Signed   By: Chadwick Colonel M.D.   On: 10/18/2023 17:37   DG Forearm Right Result Date: 10/18/2023 CLINICAL DATA:  Pain after fall. EXAM: RIGHT FOREARM - 2 VIEW  COMPARISON:  None Available. FINDINGS: The bones are subjectively under mineralized. No acute fracture. Wrist and elbow alignment are maintained. No erosive or bony destructive change. No focal soft tissue abnormalities. IMPRESSION: 1. No acute fracture of the right forearm. 2. Osteopenia/osteoporosis. Electronically Signed   By: Chadwick Colonel M.D.   On: 10/18/2023 17:36    Microbiology: Recent Results (from the past 240 hours)  Blood culture (routine x 2)     Status: None (Preliminary result)   Collection Time: 10/18/23  4:15 PM   Specimen: BLOOD  Result Value Ref Range Status   Specimen Description   Final    BLOOD LEFT ANTECUBITAL Performed at [facility], [address], [place], [place] [address]    Special Requests   Final    BOTTLES DRAWN AEROBIC AND ANAEROBIC Blood Culture adequate volume Performed at [facility], [address] [place], [place] [address]    Culture   Final    NO GROWTH < 12 HOURS Performed at [facility], [address] [place], [place] [address]    Report Status PENDING  Incomplete  Urine Culture     Status: Abnormal   Collection Time: 10/18/23  5:02 PM   Specimen: Urine, Random  Result Value Ref Range Status   Specimen Description   Final    URINE, RANDOM Performed at [facility], [address] [place], [place] [address]    Special Requests   Final    NONE Reflexed from [id number] Performed at [facility], [address], [place], [place] [address]    Culture >=100,000 COLONIES/mL ESCHERICHIA COLI (A)  Final   Report Status 10/20/2023 FINAL  Final   Organism ID, Bacteria ESCHERICHIA COLI (A)  Final      Susceptibility   Escherichia coli - MIC*    AMPICILLIN >=32 RESISTANT Resistant     CEFAZOLIN <=4 SENSITIVE Sensitive     CEFEPIME <=0.12 SENSITIVE Sensitive     CEFTRIAXONE <=0.25 SENSITIVE Sensitive     CIPROFLOXACIN 0.5 INTERMEDIATE Intermediate      GENTAMICIN <=1 SENSITIVE Sensitive     IMIPENEM <=0.25 SENSITIVE Sensitive     NITROFURANTOIN <=16 SENSITIVE Sensitive     TRIMETH/SULFA >=320 RESISTANT Resistant     AMPICILLIN/SULBACTAM 16 INTERMEDIATE Intermediate     PIP/TAZO <=4 SENSITIVE Sensitive ug/mL    * >=100,000 COLONIES/mL ESCHERICHIA COLI  Blood culture (routine x 2)     Status: None (Preliminary result)   Collection Time: 10/18/23  9:10 PM   Specimen: BLOOD  Result Value Ref Range Status   Specimen Description   Final    BLOOD SITE NOT SPECIFIED Performed at [facility], [address] [place], [place] [address]    Special Requests   Final    BOTTLES DRAWN AEROBIC AND ANAEROBIC Blood Culture adequate volume Performed at [facility], [address] [place], [place] [address]    Culture   Final    NO GROWTH < 12 HOURS Performed at [facility], [address] [place], [place] [address]    Report Status PENDING  Incomplete     Labs: Basic Metabolic Panel: Recent Labs  Lab 10/18/23 1615 10/19/23 0359 10/20/23 0756  NA 133* 134* 136  K 4.2 4.3 3.7  CL 98 104 104  CO2 27 22 24   GLUCOSE 109* 100* 94  BUN 19 15 17   CREATININE 0.81 0.79 0.75  CALCIUM 8.8* 8.6* 8.4*   Liver Function Tests: Recent Labs  Lab 10/18/23 1615 10/20/23 0756  AST 27 34  ALT 13 14  ALKPHOS 88 82  BILITOT 1.1 0.9  PROT 6.4* 6.1*  ALBUMIN 3.6 3.3*   No results for input(s): "LIPASE", "AMYLASE" in the last 168 hours. No results for input(s): "AMMONIA" in the last 168 hours. CBC: Recent Labs  Lab 10/18/23 1615 10/19/23 0359 10/20/23 0756  WBC 15.0* 13.6* 8.5  NEUTROABS 13.0*  --   --   HGB 12.7* 13.0 12.6*  HCT 39.9 40.8 38.8*  MCV 97.6 96.5 94.9  PLT 310 278 286   Cardiac Enzymes: No results for input(s): "CKTOTAL", "CKMB", "CKMBINDEX", "TROPONINI" in the last 168 hours. BNP: BNP (last 3 results) No results for input(s): "BNP" in the last 8760 hours.  ProBNP (last 3  results) No results for input(s): "PROBNP" in the last 8760 hours.  CBG: No results for input(s): "GLUCAP" in the last 168 hours.     Signed:  Hilda Lovings MD.  [facility] 10/20/2023, 11:35 AM

## 2023-10-20 NOTE — TOC Progression Note (Signed)
 Transition of Care [facility]) - Progression Note    Patient Details  Name: Shahzain Kiester MRN: [id number] Date of Birth: Feb 16, 1931  [facility]) CM/SW Contact  Bari Leys, RN Phone Number: 10/20/2023, 10:59 AM  Clinical Narrative:  Met with patient and spouse at bedside Code 44/MOON reviewed and explained, spouse gave verbal ok for spouse to sign MOON, copy provided to patient. Code 44 completed.      Expected Discharge Plan: Home w Home Health Services Barriers to Discharge: Continued Medical Work up  Expected Discharge Plan and Services       Living arrangements for the past 2 months: Single Family Home                                       Social Determinants of Health (SDOH) Interventions SDOH Screenings   Food Insecurity: Patient Unable To Answer (10/18/2023)  Housing: Patient Unable To Answer (10/18/2023)  Tobacco Use: Medium Risk (10/18/2023)    Readmission Risk Interventions    10/19/2023    2:57 PM  Readmission Risk Prevention Plan  Post Dischage Appt Complete  Medication Screening Complete  Transportation Screening Complete

## 2023-10-20 NOTE — Care Management CC44 (Signed)
 Condition Code 44 Documentation Completed  Patient Details  Name: Kevin Page MRN: [id number] Date of Birth: Jun 05, 1930   Condition Code 44 given:  Yes Patient signature on Condition Code 44 notice:  Yes Documentation of 2 MD's agreement:  Yes Code 44 added to claim:  Yes    Bari Leys, RN 10/20/2023, 10:54 AM

## 2023-10-20 NOTE — Care Management Obs Status (Signed)
 MEDICARE OBSERVATION STATUS NOTIFICATION   Patient Details  Name: Kevin Page MRN: [id number] Date of Birth: June 30, 1930   Medicare Observation Status Notification Given:  Yes    Bari Leys, RN 10/20/2023, 10:54 AM

## 2023-10-20 NOTE — Progress Notes (Signed)
 Physical Therapy Treatment Patient Details Name: Kevin Page MRN: [id number] DOB: 01/18/1931 Today's Date: 10/20/2023   History of Present Illness Patient is a 89 year old male who presented after a fall with right shoulder and forearm pain. Patient was admitted with possible UTI, collapse, and R shoulder pain. PMH: dementia, macular degeneration, lumbar fusion, SVT.    PT Comments   Pt admitted with above diagnosis.  Pt currently with functional limitations due to the deficits listed below (see PT Problem List). Pt seated in recliner when PT arrived. Spouse present. Pt agreeable to therapy intervention. Pt required min A for power up and cues for technique with sit to stand, CGA to min A for gait tasks to assist with balance and RW management with strong cues for posture and proper distance from RW for  30 feet x 2, step navigation with CGA to min A and cues for safety, technique and locating edge of step, upon return to recliner pt exhibited poor safety awareness with letting go of RW prior to proper alignment with recliner and PT able to redirect and educate on safety and fall risk prevention with transfer tasks. Pt left seated in recliner, all needs in place and spouse present. Pt will benefit from acute skilled PT to increase their independence and safety with mobility to allow discharge.      If plan is discharge home, recommend the following: A little help with walking and/or transfers;A little help with bathing/dressing/bathroom;Help with stairs or ramp for entrance;Assist for transportation;Assistance with cooking/housework;Supervision due to cognitive status   Can travel by private vehicle        Equipment Recommendations  None recommended by PT    Recommendations for Other Services       Precautions / Restrictions Precautions Precautions: Fall Restrictions Weight Bearing Restrictions Per Provider Order: No     Mobility  Bed Mobility               General bed mobility  comments: pt in recliner and returned to same    Transfers Overall transfer level: Needs assistance Equipment used: Rolling walker (2 wheels) Transfers: Sit to/from Stand Sit to Stand: Min assist           General transfer comment: cues to power up with LEs, assist to rise and transition to RW with 3 attempts and cues for pushing through B UE forward and up    Ambulation/Gait Ambulation/Gait assistance: Min assist, Contact guard assist Gait Distance (Feet): 30 Feet (30 x 2) Assistive device: Rolling walker (2 wheels) Gait Pattern/deviations: Narrow base of support, Trunk flexed, Step-to pattern, Knee flexed in stance - right, Knee flexed in stance - left Gait velocity: decreased     General Gait Details: strong cues for safety, proper distance from RW, RW management and placement with approach to surfaces including recliner and steps, pt exhibited short stride length and limited foot clearance   Stairs Stairs: Yes Stairs assistance: Min assist Stair Management: Two rails Number of Stairs: 3 General stair comments: pt required cues and increased time for step navigation with pt demonstrating difficulty locating the edge of the step and clearing B heels with decent, pt required CGA to min A for safety and balance   Wheelchair Mobility     Tilt Bed    Modified Rankin (Stroke Patients Only)       Balance Overall balance assessment: Needs assistance Sitting-balance support: Feet supported, No upper extremity supported Sitting balance-Leahy Scale: Fair     Standing balance support:  Reliant on assistive device for balance, During functional activity, Bilateral upper extremity supported Standing balance-Leahy Scale: Poor                              Communication Communication Communication: No apparent difficulties  Cognition Arousal: Alert Behavior During Therapy: Flat affect   PT - Cognitive impairments: History of cognitive impairments                          Following commands: Intact      Cueing Cueing Techniques: Verbal cues, Gestural cues  Exercises      General Comments        Pertinent Vitals/Pain Pain Assessment Pain Assessment: No/denies pain Breathing: normal Negative Vocalization: none Facial Expression: smiling or inexpressive Body Language: relaxed Consolability: no need to console PAINAD Score: 0    Home Living                          Prior Function            PT Goals (current goals can now be found in the care plan section) Acute Rehab PT Goals Patient Stated Goal: to be able to transfer STS and pivot PT Goal Formulation: With family Time For Goal Achievement: 11/02/23 Potential to Achieve Goals: Good Progress towards PT goals: Progressing toward goals    Frequency    Min 3X/week      PT Plan      Co-evaluation              AM-PAC PT "6 Clicks" Mobility   Outcome Measure  Help needed turning from your back to your side while in a flat bed without using bedrails?: A Little Help needed moving from lying on your back to sitting on the side of a flat bed without using bedrails?: A Little Help needed moving to and from a bed to a chair (including a wheelchair)?: A Little Help needed standing up from a chair using your arms (e.g., wheelchair or bedside chair)?: A Little Help needed to walk in hospital room?: A Little Help needed climbing 3-5 steps with a railing? : A Little 6 Click Score: 18    End of Session Equipment Utilized During Treatment: Gait belt Activity Tolerance: Patient tolerated treatment well Patient left: in chair;with call bell/phone within reach;with family/visitor present;with chair alarm set Nurse Communication: Mobility status PT Visit Diagnosis: Other abnormalities of gait and mobility (R26.89)     Time: 1610-9604 PT Time Calculation (min) (ACUTE ONLY): 14 min  Charges:    $Gait Training: 8-22 mins PT General Charges $$  ACUTE PT VISIT: 1 Visit                     Cary Clarks, PT Acute Rehab    Annalee Kiang 10/20/2023, 1:46 PM

## 2023-10-22 DIAGNOSIS — M170 Bilateral primary osteoarthritis of knee: Secondary | ICD-10-CM | POA: Diagnosis not present

## 2023-10-22 DIAGNOSIS — I951 Orthostatic hypotension: Secondary | ICD-10-CM | POA: Diagnosis not present

## 2023-10-22 DIAGNOSIS — Z87891 Personal history of nicotine dependence: Secondary | ICD-10-CM | POA: Diagnosis not present

## 2023-10-22 DIAGNOSIS — I10 Essential (primary) hypertension: Secondary | ICD-10-CM | POA: Diagnosis not present

## 2023-10-22 DIAGNOSIS — Z9181 History of falling: Secondary | ICD-10-CM | POA: Diagnosis not present

## 2023-10-22 DIAGNOSIS — N401 Enlarged prostate with lower urinary tract symptoms: Secondary | ICD-10-CM | POA: Diagnosis not present

## 2023-10-22 DIAGNOSIS — I739 Peripheral vascular disease, unspecified: Secondary | ICD-10-CM | POA: Diagnosis not present

## 2023-10-22 DIAGNOSIS — M810 Age-related osteoporosis without current pathological fracture: Secondary | ICD-10-CM | POA: Diagnosis not present

## 2023-10-22 DIAGNOSIS — E538 Deficiency of other specified B group vitamins: Secondary | ICD-10-CM | POA: Diagnosis not present

## 2023-10-22 DIAGNOSIS — I351 Nonrheumatic aortic (valve) insufficiency: Secondary | ICD-10-CM | POA: Diagnosis not present

## 2023-10-22 DIAGNOSIS — D500 Iron deficiency anemia secondary to blood loss (chronic): Secondary | ICD-10-CM | POA: Diagnosis not present

## 2023-10-22 DIAGNOSIS — F0280 Dementia in other diseases classified elsewhere without behavioral disturbance: Secondary | ICD-10-CM | POA: Diagnosis not present

## 2023-10-22 DIAGNOSIS — M25511 Pain in right shoulder: Secondary | ICD-10-CM | POA: Diagnosis not present

## 2023-10-22 DIAGNOSIS — N390 Urinary tract infection, site not specified: Secondary | ICD-10-CM | POA: Diagnosis not present

## 2023-10-22 DIAGNOSIS — I872 Venous insufficiency (chronic) (peripheral): Secondary | ICD-10-CM | POA: Diagnosis not present

## 2023-10-23 LAB — CULTURE, BLOOD (ROUTINE X 2)
Culture: NO GROWTH
Culture: NO GROWTH
Special Requests: ADEQUATE
Special Requests: ADEQUATE

## 2023-10-27 ENCOUNTER — Ambulatory Visit (HOSPITAL_COMMUNITY)
Admission: RE | Admit: 2023-10-27 | Discharge: 2023-10-27 | Disposition: A | Discharge status: Home / Self Care | Source: Ambulatory Visit | Attending: Vascular Surgery | Admitting: Vascular Surgery

## 2023-10-27 ENCOUNTER — Encounter: Payer: Self-pay | Admitting: Vascular Surgery

## 2023-10-27 ENCOUNTER — Ambulatory Visit: Admitting: Vascular Surgery

## 2023-10-27 VITALS — BP 120/70 | HR 62

## 2023-10-27 DIAGNOSIS — R600 Localized edema: Secondary | ICD-10-CM

## 2023-10-27 LAB — VAS US ABI WITH/WO TBI
Left ABI: 1.08
Right ABI: 1.01

## 2023-10-27 NOTE — Progress Notes (Signed)
 Patient ID: Kevin Page, male   DOB: [birth year], 89 y.o.   MRN: [id number]  Reason for Consult: New Patient (Initial Visit)   Referred by Corine Dice, NP  Subjective:     HPI:  Kevin Page is a 89 y.o. male without significant vascular history.  He has discoloration of his bilateral feet as well as swelling in the ankles and feet particularly worse throughout the day.  He spends most of his day sitting in a chair is not particularly active.  Swelling is improved with elevation.  Patient will not wear socks and particularly will not wear compression stockings.  He does not have any personal or family history of clotting disorder or history of DVT.  Past Medical History:  Diagnosis Date   Allergy    dust and mites   BPH (benign prostatic hyperplasia)    Essential hypertension    Hyperlipidemia    Macular degeneration    Osteoarthritis of both knees    Sinus bradycardia    SVT (supraventricular tachycardia) (HCC)    a. Event monitor 2014: few episodes of SVT, longest 12 beats with rate 128.   Syncope    Family History  Problem Relation Age of Onset   Cancer Brother        tonsils   Heart attack Neg Hx    Past Surgical History:  Procedure Laterality Date   CATARACT EXTRACTION W/ INTRAOCULAR LENS  IMPLANT, BILATERAL Bilateral 05/18/2009   COLONOSCOPY  2013   dupytron contractions  1998 , 2006   both hands   gi bleeding     LUMBAR FUSION  05/18/1992    Short Social History:  Social History   Tobacco Use   Smoking status: Former   Smokeless tobacco: Never   Tobacco comments:    Smoked for 44 years, quit 1990s  Substance Use Topics   Alcohol  use: Yes    Alcohol /week: 12.0 standard drinks of alcohol     Types: 12 Standard drinks or equivalent per week    Comment: 1 drink of gin nightly, occasionally more if going out to dinner.    No Known Allergies  Current Outpatient Medications  Medication Sig Dispense Refill   cholecalciferol  (VITAMIN D3) 25 MCG (1000  UNIT) tablet Take 1,000 Units by mouth daily.     diclofenac Sodium (VOLTAREN) 1 % GEL 2-4 g 4 (four) times daily as needed (for pain).     donepezil  (ARICEPT ) 10 MG tablet Take 10 mg by mouth at bedtime.  11   dutasteride  (AVODART ) 0.5 MG capsule Take 0.5 mg by mouth at bedtime.     guaiFENesin  (MUCINEX ) 600 MG 12 hr tablet Take 1 tablet (600 mg total) by mouth 2 (two) times daily. (Patient taking differently: Take 600 mg by mouth in the morning.)     Multiple Vitamins-Minerals (MACULAR VITAMIN BENEFIT PO) Take 1 capsule by mouth See admin instructions. Macular Protect- Take 1 capsule by mouth at bedtime     Psyllium (METAMUCIL PO) Take 2 tablets by mouth in the morning.     sodium chloride  (OCEAN) 0.65 % SOLN nasal spray Place 1 spray into both nostrils as needed for congestion.     pantoprazole  (PROTONIX ) 40 MG tablet Take 1 tablet (40 mg total) by mouth daily. (Patient taking differently: Take 40 mg by mouth daily before breakfast.) 30 tablet 2   No current facility-administered medications for this visit.    Review of Systems  Constitutional:  Constitutional negative. HENT: HENT negative.  Eyes: Eyes  negative.  Respiratory: Respiratory negative.  Cardiovascular: Positive for leg swelling.  GI: Gastrointestinal negative.  Musculoskeletal: Musculoskeletal negative.  Skin:       Purple discoloration of feet Neurological: Neurological negative. Hematologic: Positive for bruises/bleeds easily.  Psychiatric: Psychiatric negative.    Review of systems obtained by wife at bedside, she is a sister-in-law Dr. Shirlean Doss    Objective:  Objective   Vitals:   10/27/23 1459  BP: 120/70  Pulse: 62  SpO2: 96%   There is no height or weight on file to calculate BMI.  Physical Exam HENT:     Nose: Nose normal.     Mouth/Throat:     Mouth: Mucous membranes are moist.  Eyes:     Pupils: Pupils are equal, round, and reactive to light.  Cardiovascular:     Rate and Rhythm: Normal  rate.     Pulses: Normal pulses.  Pulmonary:     Effort: Pulmonary effort is normal.  Abdominal:     General: Abdomen is flat.  Musculoskeletal:        General: Normal range of motion.     Right lower leg: Edema present.     Left lower leg: Edema present.  Skin:    General: Skin is warm.     Capillary Refill: Capillary refill takes less than 2 seconds.  Neurological:     General: No focal deficit present.     Mental Status: He is alert.     Data: ABI Findings:  +---------+------------------+-----+-----------+--------+  Right   Rt Pressure (mmHg)IndexWaveform   Comment   +---------+------------------+-----+-----------+--------+  Brachial 121                                         +---------+------------------+-----+-----------+--------+  ATA     145               1.01 multiphasic          +---------+------------------+-----+-----------+--------+  PTA     124               0.86 multiphasic          +---------+------------------+-----+-----------+--------+  Great Toe89                0.62                      +---------+------------------+-----+-----------+--------+   +---------+------------------+-----+-----------+-------+  Left    Lt Pressure (mmHg)IndexWaveform   Comment  +---------+------------------+-----+-----------+-------+  Brachial 144                                        +---------+------------------+-----+-----------+-------+  ATA     156               1.08 multiphasic         +---------+------------------+-----+-----------+-------+  PTA     145               1.01 multiphasic         +---------+------------------+-----+-----------+-------+  Great Toe43                0.30                     +---------+------------------+-----+-----------+-------+   +-------+-----------+-----------+------------+------------+  ABI/TBIToday's ABIToday's TBIPrevious ABIPrevious TBI   +-------+-----------+-----------+------------+------------+  Right  1.01       0.62                                 +-------+-----------+-----------+------------+------------+  Left  1.08       0.3                                  +-------+-----------+-----------+------------+------------+        No previous ABI.    Summary:  Right: Resting right ankle-brachial index is within normal range. The  right toe-brachial index is abnormal.   Left: Resting left ankle-brachial index is within normal range. The left  toe-brachial index is abnormal.       Assessment/Plan:    89 year old male with swelling and discoloration of his bilateral lower extremities.  His skin all appears intact and healthy and as such I have recommended compression stockings although he is unlikely to comply.  We did discuss elevation of his legs to assist with edema and the wife demonstrates good understanding.  He can see me on an as-needed basis.     Adine Hoof MD [facility]

## 2023-10-28 DIAGNOSIS — M25511 Pain in right shoulder: Secondary | ICD-10-CM | POA: Diagnosis not present

## 2023-10-29 DIAGNOSIS — F0280 Dementia in other diseases classified elsewhere without behavioral disturbance: Secondary | ICD-10-CM | POA: Diagnosis not present

## 2023-10-29 DIAGNOSIS — D500 Iron deficiency anemia secondary to blood loss (chronic): Secondary | ICD-10-CM | POA: Diagnosis not present

## 2023-10-29 DIAGNOSIS — M25511 Pain in right shoulder: Secondary | ICD-10-CM | POA: Diagnosis not present

## 2023-10-29 DIAGNOSIS — N401 Enlarged prostate with lower urinary tract symptoms: Secondary | ICD-10-CM | POA: Diagnosis not present

## 2023-10-29 DIAGNOSIS — Z87891 Personal history of nicotine dependence: Secondary | ICD-10-CM | POA: Diagnosis not present

## 2023-10-29 DIAGNOSIS — I739 Peripheral vascular disease, unspecified: Secondary | ICD-10-CM | POA: Diagnosis not present

## 2023-10-29 DIAGNOSIS — N390 Urinary tract infection, site not specified: Secondary | ICD-10-CM | POA: Diagnosis not present

## 2023-10-29 DIAGNOSIS — I872 Venous insufficiency (chronic) (peripheral): Secondary | ICD-10-CM | POA: Diagnosis not present

## 2023-10-29 DIAGNOSIS — Z9181 History of falling: Secondary | ICD-10-CM | POA: Diagnosis not present

## 2023-10-29 DIAGNOSIS — I951 Orthostatic hypotension: Secondary | ICD-10-CM | POA: Diagnosis not present

## 2023-10-29 DIAGNOSIS — I10 Essential (primary) hypertension: Secondary | ICD-10-CM | POA: Diagnosis not present

## 2023-10-29 DIAGNOSIS — E538 Deficiency of other specified B group vitamins: Secondary | ICD-10-CM | POA: Diagnosis not present

## 2023-10-29 DIAGNOSIS — M170 Bilateral primary osteoarthritis of knee: Secondary | ICD-10-CM | POA: Diagnosis not present

## 2023-10-29 DIAGNOSIS — I351 Nonrheumatic aortic (valve) insufficiency: Secondary | ICD-10-CM | POA: Diagnosis not present

## 2023-10-29 DIAGNOSIS — M810 Age-related osteoporosis without current pathological fracture: Secondary | ICD-10-CM | POA: Diagnosis not present

## 2023-11-01 DIAGNOSIS — I10 Essential (primary) hypertension: Secondary | ICD-10-CM | POA: Diagnosis not present

## 2023-11-01 DIAGNOSIS — N3000 Acute cystitis without hematuria: Secondary | ICD-10-CM | POA: Diagnosis not present

## 2023-11-01 DIAGNOSIS — M25511 Pain in right shoulder: Secondary | ICD-10-CM | POA: Diagnosis not present

## 2023-11-01 DIAGNOSIS — T07XXXA Unspecified multiple injuries, initial encounter: Secondary | ICD-10-CM | POA: Diagnosis not present

## 2023-11-01 DIAGNOSIS — R531 Weakness: Secondary | ICD-10-CM | POA: Diagnosis not present

## 2023-11-01 DIAGNOSIS — R001 Bradycardia, unspecified: Secondary | ICD-10-CM | POA: Diagnosis not present

## 2023-11-01 DIAGNOSIS — L308 Other specified dermatitis: Secondary | ICD-10-CM | POA: Diagnosis not present

## 2023-11-01 DIAGNOSIS — S46011A Strain of muscle(s) and tendon(s) of the rotator cuff of right shoulder, initial encounter: Secondary | ICD-10-CM | POA: Diagnosis not present

## 2023-11-01 DIAGNOSIS — G301 Alzheimer's disease with late onset: Secondary | ICD-10-CM | POA: Diagnosis not present

## 2023-11-01 DIAGNOSIS — F0390 Unspecified dementia without behavioral disturbance: Secondary | ICD-10-CM | POA: Diagnosis not present

## 2023-11-01 DIAGNOSIS — W19XXXA Unspecified fall, initial encounter: Secondary | ICD-10-CM | POA: Diagnosis not present

## 2023-11-02 DIAGNOSIS — I739 Peripheral vascular disease, unspecified: Secondary | ICD-10-CM | POA: Diagnosis not present

## 2023-11-02 DIAGNOSIS — N390 Urinary tract infection, site not specified: Secondary | ICD-10-CM | POA: Diagnosis not present

## 2023-11-02 DIAGNOSIS — M170 Bilateral primary osteoarthritis of knee: Secondary | ICD-10-CM | POA: Diagnosis not present

## 2023-11-02 DIAGNOSIS — M810 Age-related osteoporosis without current pathological fracture: Secondary | ICD-10-CM | POA: Diagnosis not present

## 2023-11-02 DIAGNOSIS — I351 Nonrheumatic aortic (valve) insufficiency: Secondary | ICD-10-CM | POA: Diagnosis not present

## 2023-11-02 DIAGNOSIS — D500 Iron deficiency anemia secondary to blood loss (chronic): Secondary | ICD-10-CM | POA: Diagnosis not present

## 2023-11-02 DIAGNOSIS — E538 Deficiency of other specified B group vitamins: Secondary | ICD-10-CM | POA: Diagnosis not present

## 2023-11-02 DIAGNOSIS — I10 Essential (primary) hypertension: Secondary | ICD-10-CM | POA: Diagnosis not present

## 2023-11-02 DIAGNOSIS — F0280 Dementia in other diseases classified elsewhere without behavioral disturbance: Secondary | ICD-10-CM | POA: Diagnosis not present

## 2023-11-02 DIAGNOSIS — N401 Enlarged prostate with lower urinary tract symptoms: Secondary | ICD-10-CM | POA: Diagnosis not present

## 2023-11-02 DIAGNOSIS — I951 Orthostatic hypotension: Secondary | ICD-10-CM | POA: Diagnosis not present

## 2023-11-02 DIAGNOSIS — M25511 Pain in right shoulder: Secondary | ICD-10-CM | POA: Diagnosis not present

## 2023-11-02 DIAGNOSIS — Z9181 History of falling: Secondary | ICD-10-CM | POA: Diagnosis not present

## 2023-11-02 DIAGNOSIS — I872 Venous insufficiency (chronic) (peripheral): Secondary | ICD-10-CM | POA: Diagnosis not present

## 2023-11-02 DIAGNOSIS — Z87891 Personal history of nicotine dependence: Secondary | ICD-10-CM | POA: Diagnosis not present

## 2023-11-10 DIAGNOSIS — E538 Deficiency of other specified B group vitamins: Secondary | ICD-10-CM | POA: Diagnosis not present

## 2023-11-10 DIAGNOSIS — I951 Orthostatic hypotension: Secondary | ICD-10-CM | POA: Diagnosis not present

## 2023-11-10 DIAGNOSIS — M810 Age-related osteoporosis without current pathological fracture: Secondary | ICD-10-CM | POA: Diagnosis not present

## 2023-11-10 DIAGNOSIS — I739 Peripheral vascular disease, unspecified: Secondary | ICD-10-CM | POA: Diagnosis not present

## 2023-11-10 DIAGNOSIS — D500 Iron deficiency anemia secondary to blood loss (chronic): Secondary | ICD-10-CM | POA: Diagnosis not present

## 2023-11-10 DIAGNOSIS — F0280 Dementia in other diseases classified elsewhere without behavioral disturbance: Secondary | ICD-10-CM | POA: Diagnosis not present

## 2023-11-10 DIAGNOSIS — N390 Urinary tract infection, site not specified: Secondary | ICD-10-CM | POA: Diagnosis not present

## 2023-11-10 DIAGNOSIS — I351 Nonrheumatic aortic (valve) insufficiency: Secondary | ICD-10-CM | POA: Diagnosis not present

## 2023-11-10 DIAGNOSIS — N401 Enlarged prostate with lower urinary tract symptoms: Secondary | ICD-10-CM | POA: Diagnosis not present

## 2023-11-10 DIAGNOSIS — M25511 Pain in right shoulder: Secondary | ICD-10-CM | POA: Diagnosis not present

## 2023-11-10 DIAGNOSIS — Z9181 History of falling: Secondary | ICD-10-CM | POA: Diagnosis not present

## 2023-11-10 DIAGNOSIS — Z87891 Personal history of nicotine dependence: Secondary | ICD-10-CM | POA: Diagnosis not present

## 2023-11-10 DIAGNOSIS — I10 Essential (primary) hypertension: Secondary | ICD-10-CM | POA: Diagnosis not present

## 2023-11-10 DIAGNOSIS — I872 Venous insufficiency (chronic) (peripheral): Secondary | ICD-10-CM | POA: Diagnosis not present

## 2023-11-10 DIAGNOSIS — M170 Bilateral primary osteoarthritis of knee: Secondary | ICD-10-CM | POA: Diagnosis not present

## 2023-11-12 DIAGNOSIS — I872 Venous insufficiency (chronic) (peripheral): Secondary | ICD-10-CM | POA: Diagnosis not present

## 2023-11-12 DIAGNOSIS — N390 Urinary tract infection, site not specified: Secondary | ICD-10-CM | POA: Diagnosis not present

## 2023-11-12 DIAGNOSIS — N3000 Acute cystitis without hematuria: Secondary | ICD-10-CM | POA: Diagnosis not present

## 2023-11-12 DIAGNOSIS — E538 Deficiency of other specified B group vitamins: Secondary | ICD-10-CM | POA: Diagnosis not present

## 2023-11-12 DIAGNOSIS — I10 Essential (primary) hypertension: Secondary | ICD-10-CM | POA: Diagnosis not present

## 2023-11-12 DIAGNOSIS — Z87891 Personal history of nicotine dependence: Secondary | ICD-10-CM | POA: Diagnosis not present

## 2023-11-12 DIAGNOSIS — N401 Enlarged prostate with lower urinary tract symptoms: Secondary | ICD-10-CM | POA: Diagnosis not present

## 2023-11-12 DIAGNOSIS — M170 Bilateral primary osteoarthritis of knee: Secondary | ICD-10-CM | POA: Diagnosis not present

## 2023-11-12 DIAGNOSIS — M25511 Pain in right shoulder: Secondary | ICD-10-CM | POA: Diagnosis not present

## 2023-11-12 DIAGNOSIS — I739 Peripheral vascular disease, unspecified: Secondary | ICD-10-CM | POA: Diagnosis not present

## 2023-11-12 DIAGNOSIS — M810 Age-related osteoporosis without current pathological fracture: Secondary | ICD-10-CM | POA: Diagnosis not present

## 2023-11-12 DIAGNOSIS — F0280 Dementia in other diseases classified elsewhere without behavioral disturbance: Secondary | ICD-10-CM | POA: Diagnosis not present

## 2023-11-12 DIAGNOSIS — I351 Nonrheumatic aortic (valve) insufficiency: Secondary | ICD-10-CM | POA: Diagnosis not present

## 2023-11-12 DIAGNOSIS — D500 Iron deficiency anemia secondary to blood loss (chronic): Secondary | ICD-10-CM | POA: Diagnosis not present

## 2023-11-12 DIAGNOSIS — Z9181 History of falling: Secondary | ICD-10-CM | POA: Diagnosis not present

## 2023-11-12 DIAGNOSIS — I951 Orthostatic hypotension: Secondary | ICD-10-CM | POA: Diagnosis not present

## 2023-11-15 DIAGNOSIS — E538 Deficiency of other specified B group vitamins: Secondary | ICD-10-CM | POA: Diagnosis not present

## 2023-11-15 DIAGNOSIS — I872 Venous insufficiency (chronic) (peripheral): Secondary | ICD-10-CM | POA: Diagnosis not present

## 2023-11-15 DIAGNOSIS — I739 Peripheral vascular disease, unspecified: Secondary | ICD-10-CM | POA: Diagnosis not present

## 2023-11-15 DIAGNOSIS — M810 Age-related osteoporosis without current pathological fracture: Secondary | ICD-10-CM | POA: Diagnosis not present

## 2023-11-15 DIAGNOSIS — N401 Enlarged prostate with lower urinary tract symptoms: Secondary | ICD-10-CM | POA: Diagnosis not present

## 2023-11-15 DIAGNOSIS — I351 Nonrheumatic aortic (valve) insufficiency: Secondary | ICD-10-CM | POA: Diagnosis not present

## 2023-11-15 DIAGNOSIS — I951 Orthostatic hypotension: Secondary | ICD-10-CM | POA: Diagnosis not present

## 2023-11-15 DIAGNOSIS — Z9181 History of falling: Secondary | ICD-10-CM | POA: Diagnosis not present

## 2023-11-15 DIAGNOSIS — D500 Iron deficiency anemia secondary to blood loss (chronic): Secondary | ICD-10-CM | POA: Diagnosis not present

## 2023-11-15 DIAGNOSIS — Z87891 Personal history of nicotine dependence: Secondary | ICD-10-CM | POA: Diagnosis not present

## 2023-11-15 DIAGNOSIS — M25511 Pain in right shoulder: Secondary | ICD-10-CM | POA: Diagnosis not present

## 2023-11-15 DIAGNOSIS — N390 Urinary tract infection, site not specified: Secondary | ICD-10-CM | POA: Diagnosis not present

## 2023-11-15 DIAGNOSIS — M170 Bilateral primary osteoarthritis of knee: Secondary | ICD-10-CM | POA: Diagnosis not present

## 2023-11-15 DIAGNOSIS — F0280 Dementia in other diseases classified elsewhere without behavioral disturbance: Secondary | ICD-10-CM | POA: Diagnosis not present

## 2023-11-15 DIAGNOSIS — I10 Essential (primary) hypertension: Secondary | ICD-10-CM | POA: Diagnosis not present

## 2023-11-23 DIAGNOSIS — Z111 Encounter for screening for respiratory tuberculosis: Secondary | ICD-10-CM | POA: Diagnosis not present

## 2023-11-24 DIAGNOSIS — I10 Essential (primary) hypertension: Secondary | ICD-10-CM | POA: Diagnosis not present

## 2023-11-24 DIAGNOSIS — N390 Urinary tract infection, site not specified: Secondary | ICD-10-CM | POA: Diagnosis not present

## 2023-11-24 DIAGNOSIS — M170 Bilateral primary osteoarthritis of knee: Secondary | ICD-10-CM | POA: Diagnosis not present

## 2023-11-24 DIAGNOSIS — E538 Deficiency of other specified B group vitamins: Secondary | ICD-10-CM | POA: Diagnosis not present

## 2023-11-24 DIAGNOSIS — F0280 Dementia in other diseases classified elsewhere without behavioral disturbance: Secondary | ICD-10-CM | POA: Diagnosis not present

## 2023-11-24 DIAGNOSIS — M25511 Pain in right shoulder: Secondary | ICD-10-CM | POA: Diagnosis not present

## 2023-11-24 DIAGNOSIS — I739 Peripheral vascular disease, unspecified: Secondary | ICD-10-CM | POA: Diagnosis not present

## 2023-11-24 DIAGNOSIS — I351 Nonrheumatic aortic (valve) insufficiency: Secondary | ICD-10-CM | POA: Diagnosis not present

## 2023-11-24 DIAGNOSIS — I872 Venous insufficiency (chronic) (peripheral): Secondary | ICD-10-CM | POA: Diagnosis not present

## 2023-11-24 DIAGNOSIS — M810 Age-related osteoporosis without current pathological fracture: Secondary | ICD-10-CM | POA: Diagnosis not present

## 2023-11-24 DIAGNOSIS — Z87891 Personal history of nicotine dependence: Secondary | ICD-10-CM | POA: Diagnosis not present

## 2023-11-24 DIAGNOSIS — Z9181 History of falling: Secondary | ICD-10-CM | POA: Diagnosis not present

## 2023-11-24 DIAGNOSIS — D500 Iron deficiency anemia secondary to blood loss (chronic): Secondary | ICD-10-CM | POA: Diagnosis not present

## 2023-11-24 DIAGNOSIS — I951 Orthostatic hypotension: Secondary | ICD-10-CM | POA: Diagnosis not present

## 2023-11-24 DIAGNOSIS — N401 Enlarged prostate with lower urinary tract symptoms: Secondary | ICD-10-CM | POA: Diagnosis not present

## 2023-11-25 DIAGNOSIS — Z111 Encounter for screening for respiratory tuberculosis: Secondary | ICD-10-CM | POA: Diagnosis not present

## 2023-11-26 DIAGNOSIS — M170 Bilateral primary osteoarthritis of knee: Secondary | ICD-10-CM | POA: Diagnosis not present

## 2023-11-26 DIAGNOSIS — E538 Deficiency of other specified B group vitamins: Secondary | ICD-10-CM | POA: Diagnosis not present

## 2023-11-26 DIAGNOSIS — N390 Urinary tract infection, site not specified: Secondary | ICD-10-CM | POA: Diagnosis not present

## 2023-11-26 DIAGNOSIS — I351 Nonrheumatic aortic (valve) insufficiency: Secondary | ICD-10-CM | POA: Diagnosis not present

## 2023-11-26 DIAGNOSIS — Z9181 History of falling: Secondary | ICD-10-CM | POA: Diagnosis not present

## 2023-11-26 DIAGNOSIS — D500 Iron deficiency anemia secondary to blood loss (chronic): Secondary | ICD-10-CM | POA: Diagnosis not present

## 2023-11-26 DIAGNOSIS — I872 Venous insufficiency (chronic) (peripheral): Secondary | ICD-10-CM | POA: Diagnosis not present

## 2023-11-26 DIAGNOSIS — F0280 Dementia in other diseases classified elsewhere without behavioral disturbance: Secondary | ICD-10-CM | POA: Diagnosis not present

## 2023-11-26 DIAGNOSIS — I10 Essential (primary) hypertension: Secondary | ICD-10-CM | POA: Diagnosis not present

## 2023-11-26 DIAGNOSIS — M25511 Pain in right shoulder: Secondary | ICD-10-CM | POA: Diagnosis not present

## 2023-11-26 DIAGNOSIS — I739 Peripheral vascular disease, unspecified: Secondary | ICD-10-CM | POA: Diagnosis not present

## 2023-11-26 DIAGNOSIS — I951 Orthostatic hypotension: Secondary | ICD-10-CM | POA: Diagnosis not present

## 2023-11-26 DIAGNOSIS — N401 Enlarged prostate with lower urinary tract symptoms: Secondary | ICD-10-CM | POA: Diagnosis not present

## 2023-11-26 DIAGNOSIS — Z87891 Personal history of nicotine dependence: Secondary | ICD-10-CM | POA: Diagnosis not present

## 2023-11-26 DIAGNOSIS — M810 Age-related osteoporosis without current pathological fracture: Secondary | ICD-10-CM | POA: Diagnosis not present

## 2023-12-02 DIAGNOSIS — M170 Bilateral primary osteoarthritis of knee: Secondary | ICD-10-CM | POA: Diagnosis not present

## 2023-12-02 DIAGNOSIS — I951 Orthostatic hypotension: Secondary | ICD-10-CM | POA: Diagnosis not present

## 2023-12-02 DIAGNOSIS — D500 Iron deficiency anemia secondary to blood loss (chronic): Secondary | ICD-10-CM | POA: Diagnosis not present

## 2023-12-02 DIAGNOSIS — E538 Deficiency of other specified B group vitamins: Secondary | ICD-10-CM | POA: Diagnosis not present

## 2023-12-02 DIAGNOSIS — F0280 Dementia in other diseases classified elsewhere without behavioral disturbance: Secondary | ICD-10-CM | POA: Diagnosis not present

## 2023-12-02 DIAGNOSIS — I351 Nonrheumatic aortic (valve) insufficiency: Secondary | ICD-10-CM | POA: Diagnosis not present

## 2023-12-02 DIAGNOSIS — M810 Age-related osteoporosis without current pathological fracture: Secondary | ICD-10-CM | POA: Diagnosis not present

## 2023-12-02 DIAGNOSIS — I10 Essential (primary) hypertension: Secondary | ICD-10-CM | POA: Diagnosis not present

## 2023-12-02 DIAGNOSIS — N390 Urinary tract infection, site not specified: Secondary | ICD-10-CM | POA: Diagnosis not present

## 2023-12-02 DIAGNOSIS — I739 Peripheral vascular disease, unspecified: Secondary | ICD-10-CM | POA: Diagnosis not present

## 2023-12-02 DIAGNOSIS — I872 Venous insufficiency (chronic) (peripheral): Secondary | ICD-10-CM | POA: Diagnosis not present

## 2023-12-02 DIAGNOSIS — M25511 Pain in right shoulder: Secondary | ICD-10-CM | POA: Diagnosis not present

## 2024-01-05 ENCOUNTER — Ambulatory Visit: Admitting: Podiatry

## 2024-01-06 DIAGNOSIS — B351 Tinea unguium: Secondary | ICD-10-CM | POA: Diagnosis not present

## 2024-01-06 DIAGNOSIS — M2041 Other hammer toe(s) (acquired), right foot: Secondary | ICD-10-CM | POA: Diagnosis not present

## 2024-01-06 DIAGNOSIS — M79675 Pain in left toe(s): Secondary | ICD-10-CM | POA: Diagnosis not present

## 2024-02-10 DIAGNOSIS — L24A0 Irritant contact dermatitis due to friction or contact with body fluids, unspecified: Secondary | ICD-10-CM | POA: Diagnosis not present

## 2024-02-10 DIAGNOSIS — F0390 Unspecified dementia without behavioral disturbance: Secondary | ICD-10-CM | POA: Diagnosis not present

## 2024-02-10 DIAGNOSIS — K219 Gastro-esophageal reflux disease without esophagitis: Secondary | ICD-10-CM | POA: Diagnosis not present

## 2024-02-10 DIAGNOSIS — G309 Alzheimer's disease, unspecified: Secondary | ICD-10-CM | POA: Diagnosis not present

## 2024-02-10 DIAGNOSIS — K5900 Constipation, unspecified: Secondary | ICD-10-CM | POA: Diagnosis not present

## 2024-02-10 DIAGNOSIS — L89151 Pressure ulcer of sacral region, stage 1: Secondary | ICD-10-CM | POA: Diagnosis not present

## 2024-02-10 DIAGNOSIS — N400 Enlarged prostate without lower urinary tract symptoms: Secondary | ICD-10-CM | POA: Diagnosis not present

## 2024-02-10 DIAGNOSIS — E559 Vitamin D deficiency, unspecified: Secondary | ICD-10-CM | POA: Diagnosis not present

## 2024-02-17 DIAGNOSIS — I10 Essential (primary) hypertension: Secondary | ICD-10-CM | POA: Diagnosis not present

## 2024-02-17 DIAGNOSIS — E039 Hypothyroidism, unspecified: Secondary | ICD-10-CM | POA: Diagnosis not present

## 2024-02-17 DIAGNOSIS — E782 Mixed hyperlipidemia: Secondary | ICD-10-CM | POA: Diagnosis not present

## 2024-02-17 DIAGNOSIS — E559 Vitamin D deficiency, unspecified: Secondary | ICD-10-CM | POA: Diagnosis not present

## 2024-02-22 DIAGNOSIS — M7989 Other specified soft tissue disorders: Secondary | ICD-10-CM | POA: Diagnosis not present

## 2024-02-22 DIAGNOSIS — M24541 Contracture, right hand: Secondary | ICD-10-CM | POA: Diagnosis not present

## 2024-02-24 DIAGNOSIS — M79641 Pain in right hand: Secondary | ICD-10-CM | POA: Diagnosis not present

## 2024-03-06 ENCOUNTER — Encounter (HOSPITAL_COMMUNITY): Payer: Self-pay | Admitting: *Deleted

## 2024-03-06 ENCOUNTER — Emergency Department (HOSPITAL_COMMUNITY)
Admission: EM | Admit: 2024-03-06 | Discharge: 2024-03-06 | Disposition: A | Discharge status: Home / Self Care | Source: Skilled Nursing Facility | Attending: Emergency Medicine | Admitting: Emergency Medicine

## 2024-03-06 ENCOUNTER — Other Ambulatory Visit: Payer: Self-pay

## 2024-03-06 ENCOUNTER — Emergency Department (HOSPITAL_COMMUNITY)

## 2024-03-06 DIAGNOSIS — M85841 Other specified disorders of bone density and structure, right hand: Secondary | ICD-10-CM | POA: Diagnosis not present

## 2024-03-06 DIAGNOSIS — W19XXXA Unspecified fall, initial encounter: Secondary | ICD-10-CM | POA: Diagnosis not present

## 2024-03-06 DIAGNOSIS — F0390 Unspecified dementia without behavioral disturbance: Secondary | ICD-10-CM | POA: Insufficient documentation

## 2024-03-06 DIAGNOSIS — Z7401 Bed confinement status: Secondary | ICD-10-CM | POA: Diagnosis not present

## 2024-03-06 DIAGNOSIS — T148XXA Other injury of unspecified body region, initial encounter: Secondary | ICD-10-CM

## 2024-03-06 DIAGNOSIS — Z8673 Personal history of transient ischemic attack (TIA), and cerebral infarction without residual deficits: Secondary | ICD-10-CM | POA: Insufficient documentation

## 2024-03-06 DIAGNOSIS — I10 Essential (primary) hypertension: Secondary | ICD-10-CM | POA: Insufficient documentation

## 2024-03-06 DIAGNOSIS — R58 Hemorrhage, not elsewhere classified: Secondary | ICD-10-CM | POA: Diagnosis not present

## 2024-03-06 DIAGNOSIS — M19041 Primary osteoarthritis, right hand: Secondary | ICD-10-CM | POA: Diagnosis not present

## 2024-03-06 DIAGNOSIS — S61411A Laceration without foreign body of right hand, initial encounter: Secondary | ICD-10-CM | POA: Insufficient documentation

## 2024-03-06 DIAGNOSIS — W06XXXA Fall from bed, initial encounter: Secondary | ICD-10-CM | POA: Diagnosis not present

## 2024-03-06 DIAGNOSIS — S60221A Contusion of right hand, initial encounter: Secondary | ICD-10-CM | POA: Diagnosis not present

## 2024-03-06 DIAGNOSIS — S0083XA Contusion of other part of head, initial encounter: Secondary | ICD-10-CM | POA: Diagnosis not present

## 2024-03-06 DIAGNOSIS — M79643 Pain in unspecified hand: Secondary | ICD-10-CM | POA: Diagnosis not present

## 2024-03-06 DIAGNOSIS — R936 Abnormal findings on diagnostic imaging of limbs: Secondary | ICD-10-CM | POA: Diagnosis not present

## 2024-03-06 DIAGNOSIS — R4182 Altered mental status, unspecified: Secondary | ICD-10-CM | POA: Diagnosis not present

## 2024-03-06 NOTE — ED Notes (Signed)
 Discussed plan with wife at [facility]. PTAR present, report given.

## 2024-03-06 NOTE — ED Triage Notes (Signed)
 BIB [facility] from [facility] s/p unwitnessed fall in the night, found on floor on padded mat at [facility]. H/o same frequently. Reported sleeps on edge of bed. Skin tear to R first digit, and below L eye/cheek. C-collar applied/ present. VSS. BP 150/90, HR 60, RR 18, SPO2 94% RA. CBG 105. Arrives alert, NAD, calm, interactive, HOH, moans generally with most movements (described as normal for pt). H/o dementia, (takes arricept. No blood thinners. No known LOC. Pending wife arrival.

## 2024-03-06 NOTE — ED Notes (Signed)
 PTAR called

## 2024-03-06 NOTE — ED Notes (Signed)
 EDP at [facility]

## 2024-03-06 NOTE — ED Provider Notes (Signed)
  EMERGENCY DEPARTMENT AT [facility] Provider Note   CSN: [id number] Arrival date & time: 03/06/24  9187     Patient presents with: Kevin Page   Kevin Page is a 89 y.o. male.   The history is provided by the patient and medical records. The history is limited by the condition of the patient. No language interpreter was used.  Fall This is a recurrent problem. The current episode started 12 to 24 hours ago. The problem has not changed since onset.Pertinent negatives include no chest pain, no abdominal pain, no headaches and no shortness of breath. Nothing aggravates the symptoms. Nothing relieves the symptoms. He has tried nothing for the symptoms. The treatment provided no relief.       Prior to Admission medications   Medication Sig Start Date End Date Taking? Authorizing Provider  cholecalciferol  (VITAMIN D3) 25 MCG (1000 UNIT) tablet Take 1,000 Units by mouth daily.    [provider]  diclofenac Sodium (VOLTAREN) 1 % GEL 2-4 g 4 (four) times daily as needed (for pain). 05/20/21   [provider]  donepezil  (ARICEPT ) 10 MG tablet Take 10 mg by mouth at bedtime. 03/28/17   [provider]  dutasteride  (AVODART ) 0.5 MG capsule Take 0.5 mg by mouth at bedtime.    [provider]  guaiFENesin  (MUCINEX ) 600 MG 12 hr tablet Take 1 tablet (600 mg total) by mouth 2 (two) times daily. Patient taking differently: Take 600 mg by mouth in the morning. 01/25/21   Jerri Keys, MD  Multiple Vitamins-Minerals (MACULAR VITAMIN BENEFIT PO) Take 1 capsule by mouth See admin instructions. Macular Protect- Take 1 capsule by mouth at bedtime    [provider]  pantoprazole  (PROTONIX ) 40 MG tablet Take 1 tablet (40 mg total) by mouth daily. Patient taking differently: Take 40 mg by mouth daily before breakfast. 08/25/21 10/18/23  Akula, Vijaya, MD  Psyllium (METAMUCIL PO) Take 2 tablets by mouth in the morning.    [provider]  sodium  chloride (OCEAN) 0.65 % SOLN nasal spray Place 1 spray into both nostrils as needed for congestion.    [provider]    Allergies: Patient has no known allergies.    Review of Systems  Constitutional:  Negative for chills, fatigue and fever.  HENT:  Negative for congestion, ear pain and sore throat.   Eyes:  Negative for pain and visual disturbance.  Respiratory:  Negative for cough, chest tightness, shortness of breath and wheezing.   Cardiovascular:  Negative for chest pain and palpitations.  Gastrointestinal:  Negative for abdominal pain, constipation, diarrhea, nausea and vomiting.  Genitourinary:  Negative for dysuria, flank pain, frequency and hematuria.  Musculoskeletal:  Negative for arthralgias, back pain and neck pain.  Skin:  Positive for wound. Negative for rash.  Neurological:  Negative for seizures, weakness, light-headedness and headaches.  Psychiatric/Behavioral:  Negative for agitation.   All other systems reviewed and are negative.   Updated Vital Signs BP (!) 145/6   Pulse 65   Temp 97.6 F (36.4 C) (Oral)   Resp 16   SpO2 94%   Physical Exam Vitals and nursing note reviewed.  Constitutional:      General: He is not in acute distress.    Appearance: He is well-developed. He is not ill-appearing, toxic-appearing or diaphoretic.  HENT:     Head: Normocephalic and atraumatic.     Nose: Nose normal.     Mouth/Throat:     Mouth: Mucous membranes are  moist.  Eyes:     Extraocular Movements: Extraocular movements intact.     Conjunctiva/sclera: Conjunctivae normal.     Pupils: Pupils are equal, round, and reactive to light.  Neck:     Comments: In c collar Cardiovascular:     Rate and Rhythm: Normal rate and regular rhythm.     Heart sounds: No murmur heard. Pulmonary:     Effort: Pulmonary effort is normal. No respiratory distress.     Breath sounds: Normal breath sounds. No wheezing, rhonchi or rales.  Chest:     Chest wall: No tenderness.   Abdominal:     General: Abdomen is flat.     Palpations: Abdomen is soft.     Tenderness: There is no abdominal tenderness. There is no guarding or rebound.  Musculoskeletal:        General: No swelling or tenderness.     Cervical back: Neck supple. No tenderness.  Skin:    General: Skin is warm and dry.     Capillary Refill: Capillary refill takes less than 2 seconds.     Findings: Bruising present. No erythema.  Neurological:     Mental Status: He is alert.     Sensory: No sensory deficit.     Motor: No weakness.  Psychiatric:        Mood and Affect: Mood normal.     (all labs ordered are listed, but only abnormal results are displayed) Labs Reviewed - No data to display  EKG: None  Radiology: CT Cervical Spine Wo Contrast Result Date: 03/06/2024 EXAM: CT CERVICAL SPINE WITHOUT CONTRAST 03/06/2024 09:22:31 AM TECHNIQUE: CT of the cervical spine was performed without the administration of intravenous contrast. Multiplanar reformatted images are provided for review. Automated exposure control, iterative reconstruction, and/or weight based adjustment of the mA/kV was utilized to reduce the radiation dose to as low as reasonably achievable. COMPARISON: CT of the cervical spine dated 12/08/2019. CLINICAL HISTORY: Neck trauma (Age >= 65y). BIB [facility] from [facility] s/p unwitnessed fall in the night, found on floor on padded mat at [facility]. H/o same frequently. Reported sleeps on edge of bed. Skin tear to R first digit, and below L eye/cheek. C-collar applied/ present. VSS. BP 150/90, HR ; 60, RR 18, SPO2 94% RA. CBG 105. Arrives alert, NAD, calm, interactive, HOH, moans generally with most movements (described as normal for pt). H/o dementia FINDINGS: CERVICAL SPINE: BONES AND ALIGNMENT: No acute fracture or traumatic malalignment. There is slight degenerative anterolisthesis at C2-C3 and mild degenerative anterolisthesis at C7-T1. DEGENERATIVE CHANGES: There is moderate diffuse  degenerative disc disease throughout the cervical spine with near complete loss of the disc space height and mild posterior endplate ridging. There is moderate diffuse cervical facet arthrosis also present resulting in mild-to-moderate central spinal canal stenosis and neural foraminal stenosis at multiple levels. SOFT TISSUES: No prevertebral soft tissue swelling. There is moderate calcific atheromatous disease within the carotid bulbs. IMPRESSION: 1. No acute abnormality of the cervical spine related to the reported neck trauma. 2. Moderate diffuse degenerative disc disease throughout the cervical spine with near complete loss of the disc space height and mild posterior endplate ridging, resulting in mild-to-moderate-to-moderate central spinal canal stenosis and neural foraminal stenosis at multiple levels. Electronically signed by: Evalene Coho MD 03/06/2024 09:51 AM EDT RP Workstation: [id number]   DG Hand Complete Right Result Date: 03/06/2024 EXAM: 3 OR MORE VIEW(S) XRAY OF THE RIGHT HAND 03/06/2024 09:29:00 AM COMPARISON: None available. CLINICAL HISTORY: fall with skin tear  to R hand. FINDINGS: BONES AND JOINTS: No acute fracture. No focal osseous lesion. No joint dislocation. Ulnar negative variance. Widening of the scapholunate distance indicative of a scapholunate ligament tear. Mild degenerative changes of the hand. Osteopenia. SOFT TISSUES: The soft tissues are unremarkable. IMPRESSION: 1. Widening of the scapholunate distance indicative of a scapholunate ligament tear. 2. Mild degenerative changes of the hand. 3. Osteopenia. Electronically signed by: Evalene Coho MD 03/06/2024 09:47 AM EDT RP Workstation: [id number]   CT Head Wo Contrast Result Date: 03/06/2024 EXAM: CT HEAD WITHOUT CONTRAST 03/06/2024 09:22:31 AM TECHNIQUE: CT of the head was performed without the administration of intravenous contrast. Automated exposure control, iterative reconstruction, and/or weight based  adjustment of the mA/kV was utilized to reduce the radiation dose to as low as reasonably achievable. COMPARISON: CT of the head dated 07/31/2021. CLINICAL HISTORY: BIB [facility] from [facility] s/p unwitnessed fall in the night, found on floor on padded mat at [facility]. H/o same frequently. Reported sleeps on edge of bed. Skin tear to R first digit, and below L eye/cheek. C-collar applied/ present. VSS. BP 150/90, HR ; 60, RR 18, SPO2 94% RA. CBG 105. Arrives alert, NAD, calm, interactive, HOH, moans generally with most movements (described as normal for pt). H/o dementia. FINDINGS: BRAIN AND VENTRICLES: No acute hemorrhage. No evidence of acute infarct. No hydrocephalus. No extra-axial collection. No mass effect or midline shift. Moderate chronic microvascular ischemic change with generalized volume loss. ORBITS: Bilateral lens replacement. An occult fracture of the left orbital floor cannot be excluded. SINUSES: There is mixed attenuation fluid/secretions layering dependently within the left maxillary sinus. SOFT TISSUES AND SKULL: No acute soft tissue abnormality. No skull fracture. IMPRESSION: 1. No acute intracranial abnormality. 2. Moderate chronic microvascular ischemic change with generalized volume loss. 3. Mixed attenuation fluid/secretions layering dependently within the left maxillary sinus. An occult fracture of the left orbital floor cannot be excluded. Electronically signed by: Evalene Coho MD 03/06/2024 09:46 AM EDT RP Workstation: [id number]     Procedures   Medications Ordered in the ED - No data to display                                  Medical Decision Making Amount and/or Complexity of Data Reviewed Radiology: ordered.    Kevin Page is a 89 y.o. male with past medical history significant for dementia, hypertension, hyperlipidemia, previous stroke and TIAs, previous GI bleeding, and BPH who presents with a fall.  According to EMS report to nursing and patient report, he rolled  out of bed overnight and fell on the ground.  He says he remembers the fall and had no preceding symptoms.  He specifically denies any recent fevers, chills, congestion, cough, nausea, vomiting, constipation, diarrhea, or urinary changes.  He reports he has no complaints and no pain but due to the fall and being on the ground overnight they sent him in for evaluation.  Patient is right-handed and is denying significant hand pain but he has a skin tear to his right dorsum of his hand near the first digit knuckle.  He also has a small skin tear abrasion to his left cheek but denies significant facial pain.  He is in a c-collar on arrival.  On exam, lungs clear.  Chest nontender.  Abdomen nontender.  Back nontender.  No tenderness in his legs with intact sensation strength and pulses in all extremities.  Good pulses.  He  has a skin tear to his right dorsum of the hand that is hemostatic.  No snuffbox tenderness.  Good grip.  Good cap refill.  No significant bleeding or deep laceration seen.  Pupils symmetric and reactive with normal extraocular movements.  He is in a c-collar.  Symmetric smile.  Clear speech.  No other oropharyngeal trauma.  Abrasion to his left cheek that is hemostatic.  Rest of exam reassuring without focal tenderness of his head or scalp.  We agreed to get CT of his head and neck and x-ray of his right hand but given his lack of any preceding symptoms, we will hold on further workup at this time.  Patient agrees.  If imaging reassuring and patient well-appearing, anticipate discharge back to his facility.  11:34 AM Patient's imaging returned showing some blood in his left sinus that could be due to an occult orbital fracture that is not seen initially on CT.  Patient was reassessed and he has intact extraocular movements with no evidence of entrapment and had symmetric pupils without any reported vision changes.  He does have some bruising developing on his left cheek so I do suspect there  could be an occult fracture.  Patient will follow-up with ENT for this but given no entrapment do not feel he needs emergent evaluation by ENT in the emergency department at this time.  I reassessed his right wrist and he has no tenderness in the hand or wrist at all.  Just a scrape on his knuckle.  Suspect this possible scapholunate injury may be chronic however we will place him into a wrist brace and have him follow-up with orthopedic hand surgery if it continues to bother him.  Will have his wound dressed and cleaned and his tetanus is up-to-date.  Anticipate discharge after wound management and p.o. challenge.  Family arrived and we had a long conversation.  They agree with plan for discharge home and holding on more extensive workup.  Patient will get instructions to follow-up with orthopedic hand surgery team as well as ENT for the 2 possible injury seen on imaging today.  Patient is feeling well and has no complaints and we will discharge after wound being cleaned and dressed brace given.     Final diagnoses:  Fall, initial encounter  Multiple skin tears  Abnormal x-ray of hand  Contusion of face, initial encounter    ED Discharge Orders     None       Clinical Impression: 1. Fall, initial encounter   2. Multiple skin tears   3. Abnormal x-ray of hand   4. Contusion of face, initial encounter     Disposition: Discharge  Condition: Good  I have discussed the results, Dx and Tx plan with the pt(& family if present). He/she/they expressed understanding and agree(s) with the plan. Discharge instructions discussed at great length. Strict return precautions discussed and pt &/or family have verbalized understanding of the instructions. No further questions at time of discharge.    New Prescriptions   No medications on file    Follow Up: Loreli Elsie [name], MD [address] [place] [address] [phone]     Arlinda Buster, MD [address] [place] [address] [phone]   with hand surgery for follow up of the possible scapholunate wrist injury  Tobie Eldora NOVAK, MD [address], [address] [place] [address] [phone]   for follow up of the possible occult orbital wall fracture  Kleber Crean, Lonni PARAS, MD 03/06/24 1430

## 2024-03-06 NOTE — ED Notes (Signed)
Wife at BS

## 2024-03-06 NOTE — ED Notes (Signed)
 C-collar removed per EDP. EDP at [facility]. Pt updated.

## 2024-03-06 NOTE — Progress Notes (Signed)
 Orthopedic Tech Progress Note Patient Details:  Kevin Page 07-09-1930 [id number]  Patient ID: [name], male   DOB: 03-Nov-1930, 89 y.o.   MRN: [id number] Pt and his wife refused application of wrist brace.  Morna Pink 03/06/2024, 2:54 PM

## 2024-03-07 DIAGNOSIS — S61411A Laceration without foreign body of right hand, initial encounter: Secondary | ICD-10-CM | POA: Diagnosis not present

## 2024-03-07 DIAGNOSIS — S63511A Sprain of carpal joint of right wrist, initial encounter: Secondary | ICD-10-CM | POA: Diagnosis not present

## 2024-03-07 DIAGNOSIS — S0083XA Contusion of other part of head, initial encounter: Secondary | ICD-10-CM | POA: Diagnosis not present

## 2024-03-07 DIAGNOSIS — W19XXXA Unspecified fall, initial encounter: Secondary | ICD-10-CM | POA: Diagnosis not present

## 2024-03-08 DIAGNOSIS — G309 Alzheimer's disease, unspecified: Secondary | ICD-10-CM | POA: Diagnosis not present

## 2024-03-08 DIAGNOSIS — J449 Chronic obstructive pulmonary disease, unspecified: Secondary | ICD-10-CM | POA: Diagnosis not present

## 2024-03-09 DIAGNOSIS — M79641 Pain in right hand: Secondary | ICD-10-CM | POA: Diagnosis not present

## 2024-03-09 DIAGNOSIS — N400 Enlarged prostate without lower urinary tract symptoms: Secondary | ICD-10-CM | POA: Diagnosis not present

## 2024-03-09 DIAGNOSIS — E559 Vitamin D deficiency, unspecified: Secondary | ICD-10-CM | POA: Diagnosis not present

## 2024-03-09 DIAGNOSIS — I2510 Atherosclerotic heart disease of native coronary artery without angina pectoris: Secondary | ICD-10-CM | POA: Diagnosis not present

## 2024-03-09 DIAGNOSIS — S60221D Contusion of right hand, subsequent encounter: Secondary | ICD-10-CM | POA: Diagnosis not present

## 2024-03-09 DIAGNOSIS — S61411D Laceration without foreign body of right hand, subsequent encounter: Secondary | ICD-10-CM | POA: Diagnosis not present

## 2024-03-09 DIAGNOSIS — Z9181 History of falling: Secondary | ICD-10-CM | POA: Diagnosis not present

## 2024-03-09 DIAGNOSIS — N182 Chronic kidney disease, stage 2 (mild): Secondary | ICD-10-CM | POA: Diagnosis not present

## 2024-03-14 ENCOUNTER — Other Ambulatory Visit: Payer: Self-pay

## 2024-03-14 ENCOUNTER — Emergency Department (HOSPITAL_COMMUNITY)
Admission: EM | Admit: 2024-03-14 | Discharge: 2024-03-14 | Disposition: A | Discharge status: Home / Self Care | Source: Skilled Nursing Facility | Attending: Emergency Medicine | Admitting: Emergency Medicine

## 2024-03-14 ENCOUNTER — Emergency Department (HOSPITAL_COMMUNITY)

## 2024-03-14 DIAGNOSIS — Z743 Need for continuous supervision: Secondary | ICD-10-CM | POA: Diagnosis not present

## 2024-03-14 DIAGNOSIS — I10 Essential (primary) hypertension: Secondary | ICD-10-CM | POA: Insufficient documentation

## 2024-03-14 DIAGNOSIS — N390 Urinary tract infection, site not specified: Secondary | ICD-10-CM | POA: Diagnosis not present

## 2024-03-14 DIAGNOSIS — R258 Other abnormal involuntary movements: Secondary | ICD-10-CM | POA: Insufficient documentation

## 2024-03-14 DIAGNOSIS — R001 Bradycardia, unspecified: Secondary | ICD-10-CM | POA: Diagnosis not present

## 2024-03-14 DIAGNOSIS — N3001 Acute cystitis with hematuria: Secondary | ICD-10-CM | POA: Insufficient documentation

## 2024-03-14 DIAGNOSIS — R251 Tremor, unspecified: Secondary | ICD-10-CM

## 2024-03-14 DIAGNOSIS — R569 Unspecified convulsions: Secondary | ICD-10-CM | POA: Diagnosis not present

## 2024-03-14 DIAGNOSIS — Z87891 Personal history of nicotine dependence: Secondary | ICD-10-CM | POA: Insufficient documentation

## 2024-03-14 DIAGNOSIS — F0390 Unspecified dementia without behavioral disturbance: Secondary | ICD-10-CM | POA: Insufficient documentation

## 2024-03-14 DIAGNOSIS — X58XXXA Exposure to other specified factors, initial encounter: Secondary | ICD-10-CM | POA: Insufficient documentation

## 2024-03-14 DIAGNOSIS — S61411A Laceration without foreign body of right hand, initial encounter: Secondary | ICD-10-CM | POA: Insufficient documentation

## 2024-03-14 LAB — CBC WITH DIFFERENTIAL/PLATELET
Abs Immature Granulocytes: 0.04 K/uL (ref 0.00–0.07)
Basophils Absolute: 0 K/uL (ref 0.0–0.1)
Basophils Relative: 1 %
Eosinophils Absolute: 0.2 K/uL (ref 0.0–0.5)
Eosinophils Relative: 3 %
HCT: 42.1 % (ref 39.0–52.0)
Hemoglobin: 13.5 g/dL (ref 13.0–17.0)
Immature Granulocytes: 1 %
Lymphocytes Relative: 12 %
Lymphs Abs: 1 K/uL (ref 0.7–4.0)
MCH: 30.2 pg (ref 26.0–34.0)
MCHC: 32.1 g/dL (ref 30.0–36.0)
MCV: 94.2 fL (ref 80.0–100.0)
Monocytes Absolute: 0.6 K/uL (ref 0.1–1.0)
Monocytes Relative: 7 %
Neutro Abs: 6.3 K/uL (ref 1.7–7.7)
Neutrophils Relative %: 76 %
Platelets: 298 K/uL (ref 150–400)
RBC: 4.47 MIL/uL (ref 4.22–5.81)
RDW: 13.7 % (ref 11.5–15.5)
WBC: 8.2 K/uL (ref 4.0–10.5)
nRBC: 0 % (ref 0.0–0.2)

## 2024-03-14 LAB — COMPREHENSIVE METABOLIC PANEL WITH GFR
ALT: 14 U/L (ref 0–44)
AST: 34 U/L (ref 15–41)
Albumin: 3.6 g/dL (ref 3.5–5.0)
Alkaline Phosphatase: 120 U/L (ref 38–126)
Anion gap: 10 (ref 5–15)
BUN: 8 mg/dL (ref 8–23)
CO2: 27 mmol/L (ref 22–32)
Calcium: 9.3 mg/dL (ref 8.9–10.3)
Chloride: 99 mmol/L (ref 98–111)
Creatinine, Ser: 0.83 mg/dL (ref 0.61–1.24)
GFR, Estimated: >60 mL/min (ref >60)
Glucose, Bld: 100 mg/dL — ABNORMAL HIGH (ref 70–99)
Potassium: 3.8 mmol/L (ref 3.5–5.1)
Sodium: 136 mmol/L (ref 135–145)
Total Bilirubin: 0.6 mg/dL (ref 0.0–1.2)
Total Protein: 6.2 g/dL — ABNORMAL LOW (ref 6.5–8.1)

## 2024-03-14 LAB — URINALYSIS, W/ REFLEX TO CULTURE (INFECTION SUSPECTED)
Bilirubin Urine: NEGATIVE
Glucose, UA: NEGATIVE mg/dL
Ketones, ur: NEGATIVE mg/dL
Nitrite: POSITIVE — AB
Protein, ur: NEGATIVE mg/dL
RBC / HPF: >50 RBC/hpf (ref 0–5)
Specific Gravity, Urine: 1.013 (ref 1.005–1.030)
pH: 6 (ref 5.0–8.0)

## 2024-03-14 LAB — CBG MONITORING, ED: Glucose-Capillary: 82 mg/dL (ref 70–99)

## 2024-03-14 LAB — CK: Total CK: 202 U/L (ref 49–397)

## 2024-03-14 MED ORDER — CEFDINIR 300 MG PO CAPS: 300.0000 mg | ORAL_CAPSULE | Freq: Two times a day (BID) | ORAL | 0 refills | Status: DC

## 2024-03-14 NOTE — Discharge Instructions (Addendum)
 It was a pleasure caring for you today in the emergency department.  Please follow-up with neurology for further evaluation of possible seizure-like activity  Please return to the emergency department for any worsening or worrisome symptoms.    Per [place] statutes, patients with seizures are not allowed to drive until they have been seizure-free for six months.  Other recommendations include using caution when using heavy equipment or power tools. Avoid working on ladders or at heights. Take showers instead of baths.  Do not swim alone.  Ensure the water temperature is not too high on the home water heater. Do not go swimming alone. Do not lock yourself in a room alone (i.e. bathroom). When caring for infants or small children, sit down when holding, feeding, or changing them to minimize risk of injury to the child in the event you have a seizure. Maintain good sleep hygiene. Avoid alcohol .  Also recommend adequate sleep, hydration, good diet and minimize stress.     During the Seizure   - First, ensure adequate ventilation and place patients on the floor on their left side  Loosen clothing around the neck and ensure the airway is patent. If the patient is clenching the teeth, do not force the mouth open with any object as this can cause severe damage - Remove all items from the surrounding that can be hazardous. The patient may be oblivious to what's happening and may not even know what he or she is doing. If the patient is confused and wandering, either gently guide him/her away and block access to outside areas - Reassure the individual and be comforting - Call 911. In most cases, the seizure ends before EMS arrives. However, there are cases when seizures may last over 3 to 5 minutes. Or the individual may have developed breathing difficulties or severe injuries. If a pregnant patient or a person with diabetes develops a seizure, it is prudent to call an ambulance. - Finally, if the  patient does not regain full consciousness, then call EMS. Most patients will remain confused for about 45 to 90 minutes after a seizure, so you must use judgment in calling for help. - Avoid restraints but make sure the patient is in a bed with padded side rails - Place the individual in a lateral position with the neck slightly flexed; this will help the saliva drain from the mouth and prevent the tongue from falling backward - Remove all nearby furniture and other hazards from the area - Provide verbal assurance as the individual is regaining consciousness - Provide the patient with privacy if possible - Call for help and start treatment as ordered by the caregiver   After the Seizure (Postictal Stage)   After a seizure, most patients experience confusion, fatigue, muscle pain and/or a headache. Thus, one should permit the individual to sleep. For the next few days, reassurance is essential. Being calm and helping reorient the person is also of importance.   Most seizures are painless and end spontaneously. Seizures are not harmful to others but can lead to complications such as stress on the lungs, brain and the heart. Individuals with prior lung problems may develop labored breathing and respiratory distress.

## 2024-03-14 NOTE — ED Provider Notes (Signed)
**Note Kevin-Identified via Obfuscation**  [place] EMERGENCY DEPARTMENT AT [facility] Provider Note  CSN: [id number] Arrival date & time: 03/14/24 9241  Chief Complaint(s) Shaking  HPI Deforest Page is a 89 y.o. male with past medical history as below, significant for hypertension, hyperlipidemia, SVT, expressive aphasia, IDA, TIA, dementia, aortic insufficiency who presents to the ED with complaint of shaking episode   Patient arrives from SNF report of transient episode of shaking while being assisted with his dressing/getting out of bed this morning.  Patient was speaking after the episode did not appear to be confused or postictal per staff or EMS.  History is limited secondary to dementia which is baseline.  Patient reports that he is feeling fine and wants to go to sleep.  No seizure history. No recent diet/medication changes. Was seen in ED recently for a fall from bed  Past Medical History Past Medical History:  Diagnosis Date   Allergy    dust and mites   BPH (benign prostatic hyperplasia)    Essential hypertension    Hyperlipidemia    Macular degeneration    Osteoarthritis of both knees    Sinus bradycardia    SVT (supraventricular tachycardia)    a. Event monitor 2014: few episodes of SVT, longest 12 beats with rate 128.   Syncope    Patient Active Problem List   Diagnosis Date Noted   Chronic right shoulder pain 10/20/2023   UTI (urinary tract infection) 10/18/2023   Chronic venous insufficiency 10/18/2023   PAD (peripheral artery disease) 10/18/2023   Compression fracture of thoracic vertebra (HCC) 01/07/2022   Low back pain 12/17/2021   Abnormal EKG 10/07/2021   Expressive aphasia 10/07/2021   Iron deficiency anemia due to chronic blood loss 10/07/2021   TIA (transient ischemic attack) 10/07/2021   Transient alteration of awareness 10/07/2021   Physical deconditioning 08/02/2021   GI bleed 08/01/2021   Anemia, GI Bleed 07/31/2021   B12 deficiency 07/31/2021   Elevated troponin  07/31/2021   Dysphagia 03/30/2021   Disequilibrium 03/30/2021   Overflow incontinence of urine 03/30/2021   Diverticular hemorrhage    Rectal bleeding 01/23/2021   Painless rectal bleeding 01/22/2021   Dementia (HCC) 01/22/2021   Abnormal gait 10/12/2019   Palpitations 06/13/2019   SVT (supraventricular tachycardia)    Sinus bradycardia    Osteoarthritis of both knees    Macular degeneration    Allergy    Advanced dry age-related macular degeneration of both eyes with subfoveal involvement 03/03/2016   Hypertensive retinopathy of both eyes 03/03/2016   Pseudophakia of both eyes 03/03/2016   PVD (posterior vitreous detachment), both eyes 03/03/2016   Orthostasis 01/19/2016   Dilated aortic root 01/19/2016   PSVT (paroxysmal supraventricular tachycardia) 01/19/2016   Mild aortic insufficiency 01/19/2016   Sinus bradycardia seen on cardiac monitor 01/18/2016   Postnasal drip 11/05/2014   Age-related osteoporosis without current pathological fracture 11/13/2013   Incontinence of feces 11/01/2013   Tobacco user 11/01/2013   Nicotine dependence, unspecified, in remission 11/01/2013   Bradycardia 01/30/2013   Syncope and collapse 01/12/2013   BPH (benign prostatic hyperplasia) 01/12/2013   Personal history of transient ischemic attack (TIA), and cerebral infarction without residual deficits 01/13/2012   History of stroke without residual deficits 01/13/2012   Osteoarthritis 05/21/2011   Lower urinary tract symptoms due to benign prostatic hyperplasia 05/21/2011   Home Medication(s) Prior to Admission medications   Medication Sig Start Date End Date Taking? Authorizing Provider  cefdinir (OMNICEF) 300 MG capsule Take 1 capsule (300 mg  total) by mouth 2 (two) times daily for 7 days. 03/14/24 03/21/24  Elnor Savant A, DO  cholecalciferol  (VITAMIN D3) 25 MCG (1000 UNIT) tablet Take 1,000 Units by mouth daily.    [provider]  diclofenac Sodium (VOLTAREN) 1 % GEL 2-4 g 4  (four) times daily as needed (for pain). 05/20/21   [provider]  donepezil  (ARICEPT ) 10 MG tablet Take 10 mg by mouth at bedtime. 03/28/17   [provider]  dutasteride  (AVODART ) 0.5 MG capsule Take 0.5 mg by mouth at bedtime.    [provider]  guaiFENesin  (MUCINEX ) 600 MG 12 hr tablet Take 1 tablet (600 mg total) by mouth 2 (two) times daily. Patient taking differently: Take 600 mg by mouth in the morning. 01/25/21   Jerri Keys, MD  Multiple Vitamins-Minerals (MACULAR VITAMIN BENEFIT PO) Take 1 capsule by mouth See admin instructions. Macular Protect- Take 1 capsule by mouth at bedtime    [provider]  pantoprazole  (PROTONIX ) 40 MG tablet Take 1 tablet (40 mg total) by mouth daily. Patient taking differently: Take 40 mg by mouth daily before breakfast. 08/25/21 10/18/23  Akula, Vijaya, MD  Psyllium (METAMUCIL PO) Take 2 tablets by mouth in the morning.    [provider]  sodium chloride  (OCEAN) 0.65 % SOLN nasal spray Place 1 spray into both nostrils as needed for congestion.    [provider]                                                                                                                                    Past Surgical History Past Surgical History:  Procedure Laterality Date   CATARACT EXTRACTION W/ INTRAOCULAR LENS  IMPLANT, BILATERAL Bilateral 05/18/2009   COLONOSCOPY  2013   dupytron contractions  1998 , 2006   both hands   gi bleeding     LUMBAR FUSION  05/18/1992   Family History Family History  Problem Relation Age of Onset   Cancer Brother        tonsils   Heart attack Neg Hx     Social History Social History   Tobacco Use   Smoking status: Former   Smokeless tobacco: Never   Tobacco comments:    Smoked for 44 years, quit 1990s  Vaping Use   Vaping status: Never Used  Substance Use Topics   Alcohol  use: Yes    Alcohol /week: 12.0 standard drinks of alcohol     Types: 12 Standard drinks or  equivalent per week    Comment: 1 drink of gin nightly, occasionally more if going out to dinner.   Drug use: No   Allergies Patient has no known allergies.  Review of Systems A thorough review of systems was obtained and all systems are negative except as noted in the HPI and PMH.   Physical Exam Vital Signs  I have reviewed the triage vital signs BP 123/71   Pulse  64   Temp 97.8 F (36.6 C) (Oral)   Resp 20   SpO2 96%  Physical Exam Vitals and nursing note reviewed.  Constitutional:      General: He is not in acute distress.    Appearance: Normal appearance. He is well-developed. He is not ill-appearing.  HENT:     Head: Normocephalic and atraumatic.     Right Ear: External ear normal.     Left Ear: External ear normal.     Nose: Nose normal.     Mouth/Throat:     Mouth: Mucous membranes are moist.  Eyes:     General: No scleral icterus.       Right eye: No discharge.        Left eye: No discharge.     Extraocular Movements: Extraocular movements intact.     Pupils: Pupils are equal, round, and reactive to light.  Cardiovascular:     Rate and Rhythm: Normal rate.  Pulmonary:     Effort: Pulmonary effort is normal. No respiratory distress.     Breath sounds: No stridor.  Abdominal:     General: Abdomen is flat. There is no distension.     Palpations: Abdomen is soft.     Tenderness: There is no guarding.  Musculoskeletal:        General: No deformity.     Cervical back: Full passive range of motion without pain. No rigidity.  Skin:    General: Skin is warm and dry.     Coloration: Skin is not cyanotic, jaundiced or pale.      Neurological:     Mental Status: He is alert.     GCS: GCS eye subscore is 4. GCS verbal subscore is 5. GCS motor subscore is 6.     Cranial Nerves: Cranial nerves 2-12 are intact.     Sensory: Sensation is intact.     Motor: Motor function is intact.     Comments: AO x 2 to person and place Gait testing deferred secondary to patient  safety.   Psychiatric:        Speech: Speech normal.        Behavior: Behavior normal. Behavior is cooperative.     ED Results and Treatments Labs (all labs ordered are listed, but only abnormal results are displayed) Labs Reviewed  COMPREHENSIVE METABOLIC PANEL WITH GFR - Abnormal; Notable for the following components:      Result Value   Glucose, Bld 100 (*)    Total Protein 6.2 (*)    All other components within normal limits  URINALYSIS, W/ REFLEX TO CULTURE (INFECTION SUSPECTED) - Abnormal; Notable for the following components:   APPearance HAZY (*)    Hgb urine dipstick LARGE (*)    Nitrite POSITIVE (*)    Leukocytes,Ua LARGE (*)    Bacteria, UA MANY (*)    All other components within normal limits  URINE CULTURE  CBC WITH DIFFERENTIAL/PLATELET  CK  CBG MONITORING, ED  Radiology CT Head Wo Contrast Result Date: 03/14/2024 EXAM: CT HEAD WITHOUT CONTRAST 03/14/2024 08:47:00 AM TECHNIQUE: CT of the head was performed without the administration of intravenous contrast. Automated exposure control, iterative reconstruction, and/or weight based adjustment of the mA/kV was utilized to reduce the radiation dose to as low as reasonably achievable. COMPARISON: CT head Mar 06, 2024 CLINICAL HISTORY: Dementia, query first time seizure. Patient arrives by EMS, per [facility] patient had a shaking episode for 5 minutes while they were changing him in bed. Baseline history of dementia. FINDINGS: BRAIN AND VENTRICLES: Motion limited study. No acute hemorrhage. No evidence of acute infarct. No hydrocephalus. No extra-axial collection. No mass effect or midline shift. Cerebral atrophy. ORBITS: No acute abnormality. SINUSES: No acute abnormality. SOFT TISSUES AND SKULL: No acute soft tissue abnormality. No skull fracture. IMPRESSION: 1. No acute intracranial abnormality on  motion limited assessment. 2. Cerebral atrophy. Electronically signed by: Gilmore Molt MD 03/14/2024 09:05 AM EDT RP Workstation: [id number]    Pertinent labs & imaging results that were available during my care of the patient were reviewed by me and considered in my medical decision making (see MDM for details).  Medications Ordered in ED Medications - No data to display                                                                                                                                   Procedures Procedures  (including critical care time)  Medical Decision Making / ED Course    Medical Decision Making:    Shaheen Mende is a 89 y.o. male  with past medical history as below, significant for hypertension, hyperlipidemia, SVT, expressive aphasia, IDA, TIA, dementia, aortic insufficiency who presents to the ED with complaint of shaking episode . The complaint involves an extensive differential diagnosis and also carries with it a high risk of complications and morbidity.  Serious etiology was considered. Ddx includes but is not limited to: arrhythmia, seizure, electrolyte derangement, infection, dehydration, agitation etc  Complete initial physical exam performed, notably the patient was in nad.    Reviewed and confirmed nursing documentation for past medical history, family history, social history.  Vital signs reviewed.    Shaking episode > - No history of seizure per chart review, patient is back to baseline. - CT head stable - Labs stable other than UTI - no repeat episode - no elev ck - possible seizure but unclear, no obvious post-ictal period or tongue laceration/incontinence. No sz in ED - neuro intact - f/u neurology - sz precautions   Acute  cystitis > - family reports pt acting similar to prior uti - ua w/ infection, ctx sent, reviewed prior culture, start abx, not septic   Hand wound > - skin tear from recent fall - no apparent cellulitis - dressing  changed       2:31 PM:  I have discussed the diagnosis/risks/treatment options with the patient and  family.  Evaluation and diagnostic testing in the emergency department does not suggest an emergent condition requiring admission or immediate intervention beyond what has been performed at this time.  They will follow up with pcp, neurology. We also discussed returning to the ED immediately if new or worsening sx occur. We discussed the sx which are most concerning (e.g., sudden worsening pain, fever, inability to tolerate by mouth , seizure, ams) that necessitate immediate return.    The patient appears reasonably screened and/or stabilized for discharge and I doubt any other medical condition or other [facility] requiring further screening, evaluation, or treatment in the ED at this time prior to discharge.                 Additional history obtained: -Additional history obtained from family -External records from outside source obtained and reviewed including: Chart review including previous notes, labs, imaging, consultation notes including  Prior culture Home meds   Lab Tests: -I ordered, reviewed, and interpreted labs.   The pertinent results include:   Labs Reviewed  COMPREHENSIVE METABOLIC PANEL WITH GFR - Abnormal; Notable for the following components:      Result Value   Glucose, Bld 100 (*)    Total Protein 6.2 (*)    All other components within normal limits  URINALYSIS, W/ REFLEX TO CULTURE (INFECTION SUSPECTED) - Abnormal; Notable for the following components:   APPearance HAZY (*)    Hgb urine dipstick LARGE (*)    Nitrite POSITIVE (*)    Leukocytes,Ua LARGE (*)    Bacteria, UA MANY (*)    All other components within normal limits  URINE CULTURE  CBC WITH DIFFERENTIAL/PLATELET  CK  CBG MONITORING, ED    Notable for as above  EKG   EKG Interpretation Date/Time:  Tuesday March 14 2024 08:19:26 EDT Ventricular Rate:  76 PR Interval:  170 QRS  Duration:  89 QT Interval:  418 QTC Calculation: 470 R Axis:   61  Text Interpretation: Unknown rhythm, irregular rate no stemi Confirmed by Elnor Savant ([id number]) on 03/14/2024 9:20:49 AM         Imaging Studies ordered: I ordered imaging studies including CTH I independently visualized the following imaging with scope of interpretation limited to determining acute life threatening conditions related to emergency care; findings noted above I agree with the radiologist interpretation If any imaging was obtained with contrast I closely monitored patient for any possible adverse reaction a/w contrast administration in the emergency department   Medicines ordered and prescription drug management: Meds ordered this encounter  Medications   DISCONTD: cefdinir (OMNICEF) 300 MG capsule    Sig: Take 1 capsule (300 mg total) by mouth 2 (two) times daily for 7 days.    Dispense:  14 capsule    Refill:  0   cefdinir (OMNICEF) 300 MG capsule    Sig: Take 1 capsule (300 mg total) by mouth 2 (two) times daily for 7 days.    Dispense:  14 capsule    Refill:  0    -I have reviewed the patients home medicines and have made adjustments as needed   Consultations Obtained: na   Cardiac Monitoring: The patient was maintained on a cardiac monitor.  I personally viewed and interpreted the cardiac monitored which showed an underlying rhythm of: nsr Continuous pulse oximetry interpreted by myself, 99% on RA.    Social Determinants of Health:  Diagnosis or treatment significantly limited by social determinants of health: former smoker, dementia  Reevaluation: After the interventions noted above, I reevaluated the patient and found that they have improved  Co morbidities that complicate the patient evaluation  Past Medical History:  Diagnosis Date   Allergy    dust and mites   BPH (benign prostatic hyperplasia)    Essential hypertension    Hyperlipidemia    Macular degeneration     Osteoarthritis of both knees    Sinus bradycardia    SVT (supraventricular tachycardia)    a. Event monitor 2014: few episodes of SVT, longest 12 beats with rate 128.   Syncope       Dispostion: Disposition decision including need for hospitalization was considered, and patient discharged from emergency department.    Final Clinical Impression(s) / ED Diagnoses Final diagnoses:  Acute cystitis with hematuria  Episode of shaking        Elnor Jayson LABOR, DO 03/14/24 1431

## 2024-03-14 NOTE — ED Triage Notes (Addendum)
 Pt arrives by EMS, per [facility] pt had a shaking episode for 5 minutes while they were changing him in bed. Facility staff were concerned for possible seizure, but no hx of such. Per EMS pt was talking on arrival, but was slower to respond. Baseline hx dementia. CBG 132

## 2024-03-14 NOTE — ED Notes (Signed)
 Report given to [place] at [facility] facility. [facility] here to transport patient. Prescription sent with wife per her request. AVS given to [facility].

## 2024-03-15 ENCOUNTER — Inpatient Hospital Stay (HOSPITAL_COMMUNITY)
Admission: EM | Admit: 2024-03-15 | Discharge: 2024-03-22 | DRG: 689 | Disposition: A | Discharge status: Other Acute Inpatient Hospital | Source: Skilled Nursing Facility | Attending: Family Medicine | Admitting: Family Medicine

## 2024-03-15 ENCOUNTER — Emergency Department (HOSPITAL_COMMUNITY)

## 2024-03-15 DIAGNOSIS — G9341 Metabolic encephalopathy: Secondary | ICD-10-CM | POA: Diagnosis not present

## 2024-03-15 DIAGNOSIS — Z961 Presence of intraocular lens: Secondary | ICD-10-CM | POA: Diagnosis not present

## 2024-03-15 DIAGNOSIS — I10 Essential (primary) hypertension: Secondary | ICD-10-CM | POA: Diagnosis present

## 2024-03-15 DIAGNOSIS — R64 Cachexia: Secondary | ICD-10-CM | POA: Diagnosis not present

## 2024-03-15 DIAGNOSIS — R258 Other abnormal involuntary movements: Secondary | ICD-10-CM | POA: Diagnosis present

## 2024-03-15 DIAGNOSIS — R531 Weakness: Secondary | ICD-10-CM

## 2024-03-15 DIAGNOSIS — M170 Bilateral primary osteoarthritis of knee: Secondary | ICD-10-CM | POA: Diagnosis present

## 2024-03-15 DIAGNOSIS — Z515 Encounter for palliative care: Secondary | ICD-10-CM

## 2024-03-15 DIAGNOSIS — N400 Enlarged prostate without lower urinary tract symptoms: Secondary | ICD-10-CM | POA: Diagnosis not present

## 2024-03-15 DIAGNOSIS — R001 Bradycardia, unspecified: Secondary | ICD-10-CM | POA: Diagnosis present

## 2024-03-15 DIAGNOSIS — R636 Underweight: Secondary | ICD-10-CM | POA: Diagnosis not present

## 2024-03-15 DIAGNOSIS — R4701 Aphasia: Secondary | ICD-10-CM | POA: Diagnosis present

## 2024-03-15 DIAGNOSIS — N3090 Cystitis, unspecified without hematuria: Secondary | ICD-10-CM | POA: Diagnosis not present

## 2024-03-15 DIAGNOSIS — R0682 Tachypnea, not elsewhere classified: Secondary | ICD-10-CM | POA: Diagnosis present

## 2024-03-15 DIAGNOSIS — E785 Hyperlipidemia, unspecified: Secondary | ICD-10-CM | POA: Diagnosis not present

## 2024-03-15 DIAGNOSIS — R230 Cyanosis: Secondary | ICD-10-CM | POA: Diagnosis present

## 2024-03-15 DIAGNOSIS — N3000 Acute cystitis without hematuria: Secondary | ICD-10-CM | POA: Diagnosis not present

## 2024-03-15 DIAGNOSIS — R404 Transient alteration of awareness: Secondary | ICD-10-CM | POA: Diagnosis not present

## 2024-03-15 DIAGNOSIS — Z87440 Personal history of urinary (tract) infections: Secondary | ICD-10-CM | POA: Diagnosis not present

## 2024-03-15 DIAGNOSIS — R1310 Dysphagia, unspecified: Secondary | ICD-10-CM | POA: Diagnosis present

## 2024-03-15 DIAGNOSIS — I351 Nonrheumatic aortic (valve) insufficiency: Secondary | ICD-10-CM | POA: Diagnosis not present

## 2024-03-15 DIAGNOSIS — R0902 Hypoxemia: Secondary | ICD-10-CM | POA: Diagnosis not present

## 2024-03-15 DIAGNOSIS — Z7401 Bed confinement status: Secondary | ICD-10-CM

## 2024-03-15 DIAGNOSIS — A419 Sepsis, unspecified organism: Secondary | ICD-10-CM | POA: Diagnosis not present

## 2024-03-15 DIAGNOSIS — F03C0 Unspecified dementia, severe, without behavioral disturbance, psychotic disturbance, mood disturbance, and anxiety: Secondary | ICD-10-CM | POA: Diagnosis not present

## 2024-03-15 DIAGNOSIS — Z681 Body mass index (BMI) 19 or less, adult: Secondary | ICD-10-CM | POA: Diagnosis not present

## 2024-03-15 DIAGNOSIS — I959 Hypotension, unspecified: Secondary | ICD-10-CM | POA: Diagnosis present

## 2024-03-15 DIAGNOSIS — N390 Urinary tract infection, site not specified: Secondary | ICD-10-CM | POA: Diagnosis present

## 2024-03-15 DIAGNOSIS — Z9841 Cataract extraction status, right eye: Secondary | ICD-10-CM

## 2024-03-15 DIAGNOSIS — E872 Acidosis, unspecified: Secondary | ICD-10-CM | POA: Diagnosis present

## 2024-03-15 DIAGNOSIS — Z7189 Other specified counseling: Secondary | ICD-10-CM | POA: Diagnosis not present

## 2024-03-15 DIAGNOSIS — Z743 Need for continuous supervision: Secondary | ICD-10-CM | POA: Diagnosis not present

## 2024-03-15 DIAGNOSIS — B9620 Unspecified Escherichia coli [E. coli] as the cause of diseases classified elsewhere: Secondary | ICD-10-CM | POA: Diagnosis present

## 2024-03-15 DIAGNOSIS — N3001 Acute cystitis with hematuria: Secondary | ICD-10-CM | POA: Diagnosis not present

## 2024-03-15 DIAGNOSIS — Z66 Do not resuscitate: Secondary | ICD-10-CM | POA: Diagnosis not present

## 2024-03-15 DIAGNOSIS — Z87891 Personal history of nicotine dependence: Secondary | ICD-10-CM

## 2024-03-15 DIAGNOSIS — Z79899 Other long term (current) drug therapy: Secondary | ICD-10-CM

## 2024-03-15 DIAGNOSIS — Z9842 Cataract extraction status, left eye: Secondary | ICD-10-CM

## 2024-03-15 DIAGNOSIS — R4182 Altered mental status, unspecified: Principal | ICD-10-CM

## 2024-03-15 DIAGNOSIS — F0390 Unspecified dementia without behavioral disturbance: Secondary | ICD-10-CM | POA: Diagnosis not present

## 2024-03-15 DIAGNOSIS — Z8673 Personal history of transient ischemic attack (TIA), and cerebral infarction without residual deficits: Secondary | ICD-10-CM

## 2024-03-15 DIAGNOSIS — I4891 Unspecified atrial fibrillation: Secondary | ICD-10-CM | POA: Diagnosis not present

## 2024-03-15 LAB — CBC WITH DIFFERENTIAL/PLATELET
Abs Immature Granulocytes: 0.03 K/uL (ref 0.00–0.07)
Basophils Absolute: 0 K/uL (ref 0.0–0.1)
Basophils Relative: 0 %
Eosinophils Absolute: 0 K/uL (ref 0.0–0.5)
Eosinophils Relative: 0 %
HCT: 32.2 % — ABNORMAL LOW (ref 39.0–52.0)
Hemoglobin: 10.2 g/dL — ABNORMAL LOW (ref 13.0–17.0)
Immature Granulocytes: 0 %
Lymphocytes Relative: 10 %
Lymphs Abs: 0.8 K/uL (ref 0.7–4.0)
MCH: 31 pg (ref 26.0–34.0)
MCHC: 31.7 g/dL (ref 30.0–36.0)
MCV: 97.9 fL (ref 80.0–100.0)
Monocytes Absolute: 0.4 K/uL (ref 0.1–1.0)
Monocytes Relative: 5 %
Neutro Abs: 7.1 K/uL (ref 1.7–7.7)
Neutrophils Relative %: 85 %
Platelets: 206 K/uL (ref 150–400)
RBC: 3.29 MIL/uL — ABNORMAL LOW (ref 4.22–5.81)
RDW: 13.8 % (ref 11.5–15.5)
WBC: 8.4 K/uL (ref 4.0–10.5)
nRBC: 0 % (ref 0.0–0.2)

## 2024-03-15 LAB — I-STAT CG4 LACTIC ACID, ED
Lactic Acid, Venous: 2.7 mmol/L (ref 0.5–1.9)
Lactic Acid, Venous: 2.7 mmol/L (ref 0.5–1.9)
Lactic Acid, Venous: 1.8 mmol/L (ref 0.5–1.9)
Lactic Acid, Venous: 1.7 mmol/L (ref 0.5–1.9)

## 2024-03-15 LAB — COMPREHENSIVE METABOLIC PANEL WITH GFR
ALT: 11 U/L (ref 0–44)
AST: 32 U/L (ref 15–41)
Albumin: 3.2 g/dL — ABNORMAL LOW (ref 3.5–5.0)
Alkaline Phosphatase: 117 U/L (ref 38–126)
Anion gap: 12 (ref 5–15)
BUN: 10 mg/dL (ref 8–23)
CO2: 24 mmol/L (ref 22–32)
Calcium: 8.9 mg/dL (ref 8.9–10.3)
Chloride: 101 mmol/L (ref 98–111)
Creatinine, Ser: 0.87 mg/dL (ref 0.61–1.24)
GFR, Estimated: >60 mL/min (ref >60)
Glucose, Bld: 95 mg/dL (ref 70–99)
Potassium: 4.1 mmol/L (ref 3.5–5.1)
Sodium: 137 mmol/L (ref 135–145)
Total Bilirubin: 0.9 mg/dL (ref 0.0–1.2)
Total Protein: 5.7 g/dL — ABNORMAL LOW (ref 6.5–8.1)

## 2024-03-15 LAB — MAGNESIUM: Magnesium: 2.1 mg/dL (ref 1.7–2.4)

## 2024-03-15 LAB — CK: Total CK: 123 U/L (ref 49–397)

## 2024-03-15 MED ORDER — ENOXAPARIN SODIUM 40 MG/0.4ML IJ SOSY
40.0000 mg | PREFILLED_SYRINGE | INTRAMUSCULAR | Status: DC
  Administered 2024-03-15 – 2024-03-21 (×7): 40 mg via SUBCUTANEOUS
  Filled 2024-03-15 (×7): qty 0.4

## 2024-03-15 MED ORDER — LACTATED RINGERS IV BOLUS (SEPSIS)
500.0000 mL | Freq: Once | INTRAVENOUS | Status: AC
  Administered 2024-03-15: 500 mL via INTRAVENOUS

## 2024-03-15 MED ORDER — SODIUM CHLORIDE 0.9 % IV SOLN
2.0000 g | Freq: Once | INTRAVENOUS | Status: AC
  Administered 2024-03-15: 2 g via INTRAVENOUS
  Filled 2024-03-15: qty 20

## 2024-03-15 MED ORDER — LACTATED RINGERS IV SOLN: INTRAVENOUS | Status: DC

## 2024-03-15 MED ORDER — ACETAMINOPHEN 325 MG PO TABS: 650.0000 mg | ORAL_TABLET | Freq: Four times a day (QID) | ORAL | Status: DC | PRN

## 2024-03-15 MED ORDER — SENNOSIDES-DOCUSATE SODIUM 8.6-50 MG PO TABS: 1.0000 | ORAL_TABLET | Freq: Every evening | ORAL | Status: DC | PRN

## 2024-03-15 MED ORDER — ONDANSETRON HCL 4 MG/2ML IJ SOLN: 4.0000 mg | Freq: Four times a day (QID) | INTRAMUSCULAR | Status: DC | PRN

## 2024-03-15 MED ORDER — ACETAMINOPHEN 650 MG RE SUPP: 650.0000 mg | Freq: Four times a day (QID) | RECTAL | Status: DC | PRN

## 2024-03-15 MED ORDER — LACTATED RINGERS IV BOLUS
1000.0000 mL | Freq: Once | INTRAVENOUS | Status: AC
  Administered 2024-03-15: 1000 mL via INTRAVENOUS

## 2024-03-15 MED ORDER — ALBUTEROL SULFATE (2.5 MG/3ML) 0.083% IN NEBU: 2.5000 mg | INHALATION_SOLUTION | RESPIRATORY_TRACT | Status: DC | PRN

## 2024-03-15 MED ORDER — ONDANSETRON HCL 4 MG PO TABS: 4.0000 mg | ORAL_TABLET | Freq: Four times a day (QID) | ORAL | Status: DC | PRN

## 2024-03-15 NOTE — ED Triage Notes (Signed)
 Pt arrives by EMS from [facility]. Per ems unresponsive and cyanotic. CBG 145. Pt arrives to room and pt seems to be back to baseline. Systolic 73 an ems arrival. 500 LR given. Last BP systolic 121. A/o x1 at baseline.

## 2024-03-15 NOTE — H&P (Signed)
 History and Physical    Kevin Page FMW:[id number] DOB: 10/13/1930 DOA: 03/15/2024  PCP: Kevin Elsie [name], MD   Patient coming from: Home  I have personally briefly reviewed patient's old medical records in [facility]  Chief Complaint: Altered mental status  HPI: Kevin Page is a 89 y.o. male with medical history significant of dementia oriented only to self at baseline, BPH, hypertension, hyperlipidemia, osteoarthritis, bradycardia presented with altered mental status from SNF.  Patient is confused and does not provide any reliable history.  I have reviewed patient's medical records and spoken to the ED provider myself.  Patient was brought to the ED yesterday for a shaking spell and was ultimately discharged on oral antibiotics for UTI.  Patient was found to have altered mental status again today associated with cyanosis.  Initially EMS could not get oxygen saturation and placed the patient on a nonrebreather.  Patient was initially hypotensive with blood pressure in the 70s with EMS for which she received IV fluid bolus and blood pressure improved to the 120s.  ED Course: Oxygen saturations dropped to the 70s and 80s intermittently.  Afebrile in the ED.  Initial lactic acid of 2.7 which improved to 1.8 with IV fluids.  WBC of 8.4.  Blood cultures were sent.  UA from 03/14/2024 was suggestive of UTI.  He was started on IV fluids and antibiotics.  CT head was negative for any acute intracranial abnormity.  Hospitalist service was called to evaluate the patient.  Review of Systems: As per HPI otherwise all other systems were reviewed and are negative.   Past Medical History:  Diagnosis Date   Allergy    dust and mites   BPH (benign prostatic hyperplasia)    Essential hypertension    Hyperlipidemia    Macular degeneration    Osteoarthritis of both knees    Sinus bradycardia    SVT (supraventricular tachycardia)    a. Event monitor 2014: few episodes of SVT, longest 12 beats  with rate 128.   Syncope     Past Surgical History:  Procedure Laterality Date   CATARACT EXTRACTION W/ INTRAOCULAR LENS  IMPLANT, BILATERAL Bilateral 05/18/2009   COLONOSCOPY  2013   dupytron contractions  1998 , 2006   both hands   gi bleeding     LUMBAR FUSION  05/18/1992     reports that he has quit smoking. He has never used smokeless tobacco. He reports current alcohol  use of about 12.0 standard drinks of alcohol  per week. He reports that he does not use drugs.  No Known Allergies  Family History  Problem Relation Age of Onset   Cancer Brother        tonsils   Heart attack Neg Hx     Prior to Admission medications   Medication Sig Start Date End Date Taking? Authorizing Provider  cefdinir (OMNICEF) 300 MG capsule Take 1 capsule (300 mg total) by mouth 2 (two) times daily for 7 days. 03/14/24 03/21/24  Elnor Savant A, DO  cholecalciferol  (VITAMIN D3) 25 MCG (1000 UNIT) tablet Take 1,000 Units by mouth daily.    [provider]  diclofenac Sodium (VOLTAREN) 1 % GEL 2-4 g 4 (four) times daily as needed (for pain). 05/20/21   [provider]  donepezil  (ARICEPT ) 10 MG tablet Take 10 mg by mouth at bedtime. 03/28/17   [provider]  dutasteride  (AVODART ) 0.5 MG capsule Take 0.5 mg by mouth at bedtime.    [provider]  guaiFENesin  (  MUCINEX ) 600 MG 12 hr tablet Take 1 tablet (600 mg total) by mouth 2 (two) times daily. Patient taking differently: Take 600 mg by mouth in the morning. 01/25/21   Jerri Keys, MD  Multiple Vitamins-Minerals (MACULAR VITAMIN BENEFIT PO) Take 1 capsule by mouth See admin instructions. Macular Protect- Take 1 capsule by mouth at bedtime    [provider]  pantoprazole  (PROTONIX ) 40 MG tablet Take 1 tablet (40 mg total) by mouth daily. Patient taking differently: Take 40 mg by mouth daily before breakfast. 08/25/21 10/18/23  Akula, Vijaya, MD  Psyllium (METAMUCIL PO) Take 2 tablets by mouth in the morning.     [provider]  sodium chloride  (OCEAN) 0.65 % SOLN nasal spray Place 1 spray into both nostrils as needed for congestion.    [provider]    Physical Exam: Vitals:   03/15/24 1139 03/15/24 1140  BP: (!) 125/99 (!) 125/99  Pulse: 60 (!) 58  Resp: 16   Temp: (!) 96.5 F (35.8 C)   TempSrc: Oral   SpO2: 97% 97%    Constitutional: NAD, calm, comfortable.  Elderly male lying in bed. Vitals:   03/15/24 1139 03/15/24 1140  BP: (!) 125/99 (!) 125/99  Pulse: 60 (!) 58  Resp: 16   Temp: (!) 96.5 F (35.8 C)   TempSrc: Oral   SpO2: 97% 97%   Eyes: PERRL, lids and conjunctivae normal ENMT: Mucous membranes are moist. Posterior pharynx clear of any exudate or lesions. Neck: normal, supple, no masses, no thyromegaly Respiratory: bilateral decreased breath sounds at bases, no wheezing, no crackles. Normal respiratory effort. No accessory muscle use.  Cardiovascular: S1 S2 positive, bradycardic.  No extremity edema. 2+ pedal pulses.  Abdomen: no tenderness, no masses palpated. No hepatosplenomegaly. Bowel sounds positive.  Musculoskeletal: no clubbing / cyanosis. No joint deformity upper and lower extremities.  Skin: no rashes, lesions, ulcers. No induration Neurologic: No obvious seizures noted.  No focal neurologic deficits noted.  Extremely slow to respond, very poor historian. Awake psychiatric: Could not be assessed because of mental status.   Labs on Admission: I have personally reviewed following labs and imaging studies  CBC: Recent Labs  Lab 03/14/24 0847 03/15/24 1152  WBC 8.2 8.4  NEUTROABS 6.3 7.1  HGB 13.5 10.2*  HCT 42.1 32.2*  MCV 94.2 97.9  PLT 298 206   Basic Metabolic Panel: Recent Labs  Lab 03/14/24 0847 03/15/24 1152  NA 136 137  K 3.8 4.1  CL 99 101  CO2 27 24  GLUCOSE 100* 95  BUN 8 10  CREATININE 0.83 0.87  CALCIUM 9.3 8.9  MG  --  2.1   GFR: Estimated Creatinine Clearance: 51.5 mL/min (by C-G formula based on SCr of  0.87 mg/dL). Liver Function Tests: Recent Labs  Lab 03/14/24 0847 03/15/24 1152  AST 34 32  ALT 14 11  ALKPHOS 120 117  BILITOT 0.6 0.9  PROT 6.2* 5.7*  ALBUMIN 3.6 3.2*   No results for input(s): LIPASE, AMYLASE in the last 168 hours. No results for input(s): AMMONIA in the last 168 hours. Coagulation Profile: No results for input(s): INR, PROTIME in the last 168 hours. Cardiac Enzymes: Recent Labs  Lab 03/14/24 0847 03/15/24 1152  CKTOTAL 202 123   BNP (last 3 results) No results for input(s): PROBNP in the last 8760 hours. HbA1C: No results for input(s): HGBA1C in the last 72 hours. CBG: Recent Labs  Lab 03/14/24 0852  GLUCAP 82   Lipid Profile: No  results for input(s): CHOL, HDL, LDLCALC, TRIG, CHOLHDL, LDLDIRECT in the last 72 hours. Thyroid  Function Tests: No results for input(s): TSH, T4TOTAL, FREET4, T3FREE, THYROIDAB in the last 72 hours. Anemia Panel: No results for input(s): VITAMINB12, FOLATE, FERRITIN, TIBC, IRON, RETICCTPCT in the last 72 hours. Urine analysis:    Component Value Date/Time   COLORURINE YELLOW 03/14/2024 1316   APPEARANCEUR HAZY (A) 03/14/2024 1316   LABSPEC 1.013 03/14/2024 1316   PHURINE 6.0 03/14/2024 1316   GLUCOSEU NEGATIVE 03/14/2024 1316   HGBUR LARGE (A) 03/14/2024 1316   BILIRUBINUR NEGATIVE 03/14/2024 1316   KETONESUR NEGATIVE 03/14/2024 1316   PROTEINUR NEGATIVE 03/14/2024 1316   UROBILINOGEN 0.2 01/11/2013 2244   NITRITE POSITIVE (A) 03/14/2024 1316   LEUKOCYTESUR LARGE (A) 03/14/2024 1316    Radiological Exams on Admission: CT Head Wo Contrast Result Date: 03/15/2024 EXAM: CT HEAD WITHOUT CONTRAST 03/15/2024 01:48:50 PM TECHNIQUE: CT of the head was performed without the administration of intravenous contrast. Automated exposure control, iterative reconstruction, and/or weight based adjustment of the mA/kV was utilized to reduce the radiation dose to as low as  reasonably achievable. COMPARISON: 03/14/2024 CLINICAL HISTORY: Mental status change, unknown cause. FINDINGS: BRAIN AND VENTRICLES: No acute hemorrhage. No evidence of acute infarct. No hydrocephalus. No extra-axial collection. No mass effect or midline shift. There is diffuse cerebral atrophy and chronic small vessel disease throughout the deep white matter. ORBITS: No acute abnormality. SINUSES: No acute abnormality. SOFT TISSUES AND SKULL: No acute soft tissue abnormality. No skull fracture. IMPRESSION: 1. No acute intracranial abnormality. 2. Diffuse cerebral atrophy. 3. Chronic small vessel disease throughout the deep white matter. Electronically signed by: Franky Crease MD 03/15/2024 02:38 PM EDT RP Workstation: [id number]   CT Head Wo Contrast Result Date: 03/14/2024 EXAM: CT HEAD WITHOUT CONTRAST 03/14/2024 08:47:00 AM TECHNIQUE: CT of the head was performed without the administration of intravenous contrast. Automated exposure control, iterative reconstruction, and/or weight based adjustment of the mA/kV was utilized to reduce the radiation dose to as low as reasonably achievable. COMPARISON: CT head Mar 06, 2024 CLINICAL HISTORY: Dementia, query first time seizure. Patient arrives by EMS, per [facility] patient had a shaking episode for 5 minutes while they were changing him in bed. Baseline history of dementia. FINDINGS: BRAIN AND VENTRICLES: Motion limited study. No acute hemorrhage. No evidence of acute infarct. No hydrocephalus. No extra-axial collection. No mass effect or midline shift. Cerebral atrophy. ORBITS: No acute abnormality. SINUSES: No acute abnormality. SOFT TISSUES AND SKULL: No acute soft tissue abnormality. No skull fracture. IMPRESSION: 1. No acute intracranial abnormality on motion limited assessment. 2. Cerebral atrophy. Electronically signed by: Gilmore Molt MD 03/14/2024 09:05 AM EDT RP Workstation: [id number]    Assessment/Plan  UTI: Present on admission Sepsis:  Present on admission - Presented with altered mental status possibly from UTI.  UA from 03/14/2024 showed possible UTI.  Patient was more confused on presentation with lactic acidosis, initial hypotension - Follow-up blood and urine cultures.  Continue IV fluids and Rocephin .  Acute metabolic encephalopathy Dementia -Presented with worsening confusion possibly from UTI in a patient with dementia.  Concern for seizure as well.  Fall precautions.  Seizure/delirium precautions.  CT head today as well was negative for any intracranial abnormality.  EEG. - PT/OT/SLP eval    DVT prophylaxis: Lovenox  Code Status: DNR Family Communication: Wife at bedside Disposition Plan: Possibly back to SNF in 2 to 3 days Consults called: None Admission status: Telemetry/inpatient  Severity of Illness: The appropriate patient status  for this patient is INPATIENT. Inpatient status is judged to be reasonable and necessary in order to provide the required intensity of service to ensure the patient's safety. The patient's presenting symptoms, physical exam findings, and initial radiographic and laboratory data in the context of their chronic comorbidities is felt to place them at high risk for further clinical deterioration. Furthermore, it is not anticipated that the patient will be medically stable for discharge from the hospital within 2 midnights of admission.   * I certify that at the point of admission it is my clinical judgment that the patient will require inpatient hospital care spanning beyond 2 midnights from the point of admission due to high intensity of service, high risk for further deterioration and high frequency of surveillance required.Kevin Page Sophie Mao MD [facility]  03/15/2024, 3:00 PM

## 2024-03-15 NOTE — Sepsis Progress Note (Signed)
 Sepsis protocol monitored by eLink
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
## 2024-03-16 ENCOUNTER — Inpatient Hospital Stay (HOSPITAL_COMMUNITY)

## 2024-03-16 ENCOUNTER — Other Ambulatory Visit: Payer: Self-pay

## 2024-03-16 DIAGNOSIS — N3000 Acute cystitis without hematuria: Secondary | ICD-10-CM | POA: Diagnosis not present

## 2024-03-16 LAB — AMMONIA: Ammonia: 24 umol/L (ref 9–35)

## 2024-03-16 LAB — BLOOD CULTURE ID PANEL (REFLEXED) - BCID2

## 2024-03-16 LAB — COMPREHENSIVE METABOLIC PANEL WITH GFR
ALT: 12 U/L (ref 0–44)
AST: 29 U/L (ref 15–41)
Albumin: 2.9 g/dL — ABNORMAL LOW (ref 3.5–5.0)
Alkaline Phosphatase: 102 U/L (ref 38–126)
Anion gap: 11 (ref 5–15)
BUN: 10 mg/dL (ref 8–23)
CO2: 24 mmol/L (ref 22–32)
Calcium: 8.3 mg/dL — ABNORMAL LOW (ref 8.9–10.3)
Chloride: 101 mmol/L (ref 98–111)
Creatinine, Ser: 0.76 mg/dL (ref 0.61–1.24)
GFR, Estimated: >60 mL/min (ref >60)
Glucose, Bld: 93 mg/dL (ref 70–99)
Potassium: 4 mmol/L (ref 3.5–5.1)
Sodium: 137 mmol/L (ref 135–145)
Total Bilirubin: 0.8 mg/dL (ref 0.0–1.2)
Total Protein: 5 g/dL — ABNORMAL LOW (ref 6.5–8.1)

## 2024-03-16 LAB — CBC
HCT: 34.6 % — ABNORMAL LOW (ref 39.0–52.0)
Hemoglobin: 11.1 g/dL — ABNORMAL LOW (ref 13.0–17.0)
MCH: 30 pg (ref 26.0–34.0)
MCHC: 32.1 g/dL (ref 30.0–36.0)
MCV: 93.5 fL (ref 80.0–100.0)
Platelets: 280 K/uL (ref 150–400)
RBC: 3.7 MIL/uL — ABNORMAL LOW (ref 4.22–5.81)
RDW: 13.8 % (ref 11.5–15.5)
WBC: 14.5 K/uL — ABNORMAL HIGH (ref 4.0–10.5)
nRBC: 0 % (ref 0.0–0.2)

## 2024-03-16 LAB — VITAMIN B12: Vitamin B-12: 664 pg/mL (ref 180–914)

## 2024-03-16 LAB — FOLATE: Folate: 7.9 ng/mL (ref >5.9)

## 2024-03-16 LAB — TSH: TSH: 0.704 u[IU]/mL (ref 0.350–4.500)

## 2024-03-16 MED ORDER — DONEPEZIL HCL 10 MG PO TABS
10.0000 mg | ORAL_TABLET | Freq: Every day | ORAL | Status: DC
  Administered 2024-03-16 – 2024-03-21 (×6): 10 mg via ORAL
  Filled 2024-03-16 (×6): qty 1

## 2024-03-16 MED ORDER — SODIUM CHLORIDE 0.9 % IV SOLN: INTRAVENOUS | Status: DC

## 2024-03-16 MED ORDER — ORAL CARE MOUTH RINSE: 15.0000 mL | OROMUCOSAL | Status: DC | PRN

## 2024-03-16 MED ORDER — ORAL CARE MOUTH RINSE
15.0000 mL | OROMUCOSAL | Status: DC
  Administered 2024-03-16 – 2024-03-22 (×24): 15 mL via OROMUCOSAL

## 2024-03-16 MED ORDER — SODIUM CHLORIDE 0.9 % IV SOLN
2.0000 g | Freq: Every day | INTRAVENOUS | Status: DC
  Administered 2024-03-16 – 2024-03-17 (×2): 2 g via INTRAVENOUS
  Filled 2024-03-16 (×2): qty 20

## 2024-03-16 NOTE — Progress Notes (Signed)
 PT Cancellation Note  Patient Details Name: Kevin Page MRN: [id number] DOB: Jan 30, 1931   Cancelled Treatment:    Reason Eval/Treat Not Completed: Other (comment) (palliative consult). Spoke with RN, palliative consult pending, will continue to follow for PT Eval as appropriate.   Tori Corean Yoshimura PT, DPT 03/16/24, 11:08 AM

## 2024-03-16 NOTE — Progress Notes (Signed)
 PHARMACY - PHYSICIAN COMMUNICATION CRITICAL VALUE ALERT - BLOOD CULTURE IDENTIFICATION (BCID)  Kevin Page is an 89 y.o. male who presented to [facility] on 03/15/2024 with a chief complaint of AMS  Assessment: 1/5 staph epi, no R  Name of physician (or Provider) Contacted: Andrez  Current antibiotics: CTX  Changes to prescribed antibiotics recommended:  None, probable contaminant  Results for orders placed or performed during the hospital encounter of 03/15/24  Blood Culture ID Panel (Reflexed) (Collected: 03/15/2024  1:23 PM)  Result Value Ref Range   Enterococcus faecalis NOT DETECTED NOT DETECTED   Enterococcus Faecium NOT DETECTED NOT DETECTED   Listeria monocytogenes NOT DETECTED NOT DETECTED   Staphylococcus species DETECTED (A) NOT DETECTED   Staphylococcus aureus (BCID) NOT DETECTED NOT DETECTED   Staphylococcus epidermidis DETECTED (A) NOT DETECTED   Staphylococcus lugdunensis NOT DETECTED NOT DETECTED   Streptococcus species NOT DETECTED NOT DETECTED   Streptococcus agalactiae NOT DETECTED NOT DETECTED   Streptococcus pneumoniae NOT DETECTED NOT DETECTED   Streptococcus pyogenes NOT DETECTED NOT DETECTED   A.calcoaceticus-baumannii NOT DETECTED NOT DETECTED   Bacteroides fragilis NOT DETECTED NOT DETECTED   Enterobacterales NOT DETECTED NOT DETECTED   Enterobacter cloacae complex NOT DETECTED NOT DETECTED   Escherichia coli NOT DETECTED NOT DETECTED   Klebsiella aerogenes NOT DETECTED NOT DETECTED   Klebsiella oxytoca NOT DETECTED NOT DETECTED   Klebsiella pneumoniae NOT DETECTED NOT DETECTED   Proteus species NOT DETECTED NOT DETECTED   Salmonella species NOT DETECTED NOT DETECTED   Serratia marcescens NOT DETECTED NOT DETECTED   Haemophilus influenzae NOT DETECTED NOT DETECTED   Neisseria meningitidis NOT DETECTED NOT DETECTED   Pseudomonas aeruginosa NOT DETECTED NOT DETECTED   Stenotrophomonas maltophilia NOT DETECTED NOT DETECTED   Candida albicans NOT  DETECTED NOT DETECTED   Candida auris NOT DETECTED NOT DETECTED   Candida glabrata NOT DETECTED NOT DETECTED   Candida krusei NOT DETECTED NOT DETECTED   Candida parapsilosis NOT DETECTED NOT DETECTED   Candida tropicalis NOT DETECTED NOT DETECTED   Cryptococcus neoformans/gattii NOT DETECTED NOT DETECTED   Methicillin resistance mecA/C NOT DETECTED NOT DETECTED    Leeroy Mace RPh 03/16/2024, 11:02 PM

## 2024-03-16 NOTE — Evaluation (Signed)
 Clinical/Bedside Swallow Evaluation Patient Details  Name: Kevin Page MRN: [id number] Date of Birth: 10/23/1930  Today's Date: 03/16/2024 Time: SLP Start Time (ACUTE ONLY): 0849 SLP Stop Time (ACUTE ONLY): 0930 SLP Time Calculation (min) (ACUTE ONLY): 41 min  Past Medical History:  Past Medical History:  Diagnosis Date   Allergy    dust and mites   BPH (benign prostatic hyperplasia)    Essential hypertension    Hyperlipidemia    Macular degeneration    Osteoarthritis of both knees    Sinus bradycardia    SVT (supraventricular tachycardia)    a. Event monitor 2014: few episodes of SVT, longest 12 beats with rate 128.   Syncope    Past Surgical History:  Past Surgical History:  Procedure Laterality Date   CATARACT EXTRACTION W/ INTRAOCULAR LENS  IMPLANT, BILATERAL Bilateral 05/18/2009   COLONOSCOPY  2013   dupytron contractions  1998 , 2006   both hands   gi bleeding     LUMBAR FUSION  05/18/1992   HPI:  Per MD note Kevin Page is a 89 y.o. male with medical history significant of dementia oriented only to self at baseline, BPH, hypertension, hyperlipidemia, osteoarthritis, bradycardia presented with altered mental status from memory care.  Patient seen in ED and was diagnosed as having had UTI and is s/p ABX treatment.  Patient was found to have altered mental status again today associated with cyanosis.  Has prior h/o dysphagia - c/b DG esoph 03/2021 Limited examination. 2. Esophageal dysmotility..No esophageal mass or hiatal hernia.  4. No GE reflux demonstrated.  MBS as an OP 2023 showed vallecular retention with appearance of cervical ostephyte.  Wife present and reports it has been a long goodbye and reports they have seen palliative in the past but not recently. She is willing to pursue palliative input again per direct ? from SLP. Wife reports pt coughs all the time which she associates with post nasal drainage.    Assessment / Plan / Recommendation  Clinical  Impression  At the time of this evaluation, Kevin Page, is rather lethargic but would wake momentarily during session. He is weak with nonproductive cough present.  Wife reports upper denture was lost at the facility.  Kevin Page demonstrates dysphagia findings consistent with prior MBS evidenced by multiple sub swallows.  In addition, weak cough across po trials offered *orange juice* via tsp and cup. SLP ceased intake due to pt ceasing following directions.  Reviewed prior findings of swallow evaluations with pt's wife using video fluoroscopy loops and advised to likely severe exacerbation of dysphagia from at least 3 years ago due to his AMS.  Recommended to have Kevin Page only consume po when FULLY alert and tolerating. If he is coughing, he is likely aspirating -without ability to clear and educated her that he will not receive any benefit from aspiration and may be at incr risk of pulmonary infections. She reported understanding to this information and SLP recommendation to consider po for comfort not nutritional support at this time due to his mentation, illness.  Inquired about potential palliative consult - to which she was agreeable. SLP Visit Diagnosis: Dysphagia, oropharyngeal phase (R13.12)    Aspiration Risk  Severe aspiration risk;Risk for inadequate nutrition/hydration    Diet Recommendation Other (Comment);Dysphagia 3 (Mech soft);Thin liquid (COMFORT PO)    Medication Administration: Other (Comment) (consider with puree if not contraindicated) Supervision: Staff to assist with self feeding;Full supervision/cueing for compensatory strategies Compensations: Small sips/bites;Slow rate;Other (Comment) (Stop intake if pt coughing, feed only  when FULLY ALERT)    Other  Recommendations Oral Care Recommendations: Oral care QID Caregiver Recommendations: Have oral suction available     Assistance Recommended at Discharge  full  Functional Status Assessment Patient has had a recent decline in their  functional status and/or demonstrates limited ability to make significant improvements in function in a reasonable and predictable amount of time  Frequency and Duration min 1 x/week  1 week       Prognosis Prognosis for improved oropharyngeal function: Guarded Barriers to Reach Goals: Time post onset;Other (Comment) (mentation)      Swallow Study   General Date of Onset: 03/16/24 HPI: Per MD note Kevin Page is a 89 y.o. male with medical history significant of dementia oriented only to self at baseline, BPH, hypertension, hyperlipidemia, osteoarthritis, bradycardia presented with altered mental status from memory care.  Patient seen in ED and was diagnosed as having had UTI and is s/p ABX treatment.  Patient was found to have altered mental status again today associated with cyanosis.  Has prior h/o dysphagia - c/b DG esoph 03/2021 Limited examination. 2. Esophageal dysmotility..No esophageal mass or hiatal hernia.  4. No GE reflux demonstrated.  MBS as an OP 2023 showed vallecular retention with appearance of cervical ostephyte.  Wife present and reports it has been a long goodbye and reports they have seen palliative in the past but not recently. She is willing to pursue palliative input again per direct ? from SLP. Wife reports pt coughs all the time which she associates with post nasal drainage. Type of Study: Bedside Swallow Evaluation Previous Swallow Assessment: see HPI Diet Prior to this Study: Regular;Thin liquids (Level 0) Temperature Spikes Noted: No Respiratory Status: Room air History of Recent Intubation: No Behavior/Cognition: Lethargic/Drowsy;Requires cueing Oral Cavity Assessment: Excessive secretions Oral Cavity - Dentition: Adequate natural dentition Vision: Impaired for self-feeding Self-Feeding Abilities: Total assist Patient Positioning: Upright in bed Baseline Vocal Quality: Low vocal intensity Volitional Cough: Weak;Congested Volitional Swallow: Unable to  elicit    Oral/Motor/Sensory Function Overall Oral Motor/Sensory Function: Generalized oral weakness   Ice Chips Ice chips: Not tested   Thin Liquid Thin Liquid: Impaired Presentation: Spoon Pharyngeal  Phase Impairments: Multiple swallows;Cough - Delayed    Nectar Thick Nectar Thick Liquid: Not tested   Honey Thick Honey Thick Liquid: Not tested   Puree Puree: Not tested   Solid  Madelin POUR, MS [facility] SLP Acute Rehab Services Office [phone]    Solid: Not tested      Nicolas Emmie Caldron 03/16/2024,10:46 AM

## 2024-03-16 NOTE — TOC Initial Note (Addendum)
 Transition of Care [facility]) - Initial/Assessment Note    Patient Details  Name: Kevin Page MRN: [id number] Date of Birth: [birth year]  [facility] CM/SW Contact:    Sonda Manuella Quill, RN Phone Number: 03/16/2024, 9:59 AM  Clinical Narrative:                 Spoke w/ pt's wife Deloris Mittag [phone]) at bedside; she said pt is from [facility]; she plans for him to return w/ ambulance transport; pt insurance/PCP verified; she denied pt experiencing SDOH risks; he has walker, wheelchair, and shower chair; he does not have HH services or home oxygen; awaiting PT/OT evals; LVM for [facility], Admissions at facility to verify residency and level of care; awaiting return call.  -[phone]- notified pt's wife requests to speak w/ RN CM; spoke w/ her at bedside; Ms Mcquarrie said she was advised that pt should return to [facility], but d/c to SNF instead; pt's wife notified recc for SNF for short term rehab based on PT/OT evals, and ins auth needed; pt's wife said she is interested in long-term care; additionally explained SNF process; she verbalized understanding; her facilities of choice are [facility], [facility], and [facility]; explained that [facility] she has been paying out of pocket for memory care, and is willing to pay out of pocket for long term care; pt's wife requested this RN CM speak w/ rep from [facility] to explain this process; she will have agency contact IP CM  -[phone]- return call from [facility], Admissions at [facility]; she said pt is in memory care, cottage [address]; she said pt can return at d/c. Expected Discharge Plan: Skilled Nursing Facility Barriers to Discharge: Continued Medical Work up   Patient Goals and CMS Choice Patient states their goals for this hospitalization and ongoing recovery are:: pt's wife Kevin Page return to Assisted Living CMS Medicare.gov Compare Post Acute Care list provided to:: Patient Represenative (must comment) Sheron Iles)   [facility] ownership interest in [facility].provided to:: Spouse    Expected Discharge Plan and Services   Discharge Planning Services: CM Consult   Living arrangements for the past 2 months: Assisted Living Facility                 DME Arranged: N/A DME Agency: NA       HH Arranged: NA HH Agency: NA        Prior Living Arrangements/Services Living arrangements for the past 2 months: Assisted Living Facility Lives with:: Facility Resident Patient language and need for interpreter reviewed:: Yes Do you feel safe going back to the place where you live?: Yes      Need for Family Participation in Patient Care: Yes (Comment) Care giver support system in place?: Yes (comment) Current home services: DME (walker, wheelchair, shower chair) Criminal Activity/Legal Involvement Pertinent to Current Situation/Hospitalization: No - Comment as needed  Activities of Daily Living   ADL Screening (condition at time of admission) Independently performs ADLs?: No Does the patient have a NEW difficulty with bathing/dressing/toileting/self-feeding that is expected to last >3 days?: No Does the patient have a NEW difficulty with getting in/out of bed, walking, or climbing stairs that is expected to last >3 days?: No Does the patient have a NEW difficulty with communication that is expected to last >3 days?: No Is the patient deaf or have difficulty hearing?: No Does the patient have difficulty seeing, even when wearing glasses/contacts?: No Does the patient have difficulty concentrating, remembering,  or making decisions?: No  Permission Sought/Granted Permission sought to share information with : Case Manager Permission granted to share information with : Yes, Verbal Permission Granted  Share Information with NAME: Case Manager     Permission granted to share info w Relationship: Cassie Henkels (spouse) [phone]     Emotional Assessment Appearance:: Appears stated  age Attitude/Demeanor/Rapport: Unable to Assess Affect (typically observed): Unable to Assess Orientation: :  (unable to assess) Alcohol  / Substance Use: Not Applicable Psych Involvement: No (comment)  Admission diagnosis:  UTI (urinary tract infection) [N39.0] Cystitis [N30.90] Altered mental status, unspecified altered mental status type [R41.82] Sepsis, due to unspecified organism, unspecified whether acute organ dysfunction present [facility]) [A41.9] Patient Active Problem List   Diagnosis Date Noted   Chronic right shoulder pain 10/20/2023   UTI (urinary tract infection) 10/18/2023   Chronic venous insufficiency 10/18/2023   PAD (peripheral artery disease) 10/18/2023   Compression fracture of thoracic vertebra (HCC) 01/07/2022   Low back pain 12/17/2021   Abnormal EKG 10/07/2021   Expressive aphasia 10/07/2021   Iron deficiency anemia due to chronic blood loss 10/07/2021   TIA (transient ischemic attack) 10/07/2021   Transient alteration of awareness 10/07/2021   Physical deconditioning 08/02/2021   GI bleed 08/01/2021   Anemia, GI Bleed 07/31/2021   B12 deficiency 07/31/2021   Elevated troponin 07/31/2021   Dysphagia 03/30/2021   Disequilibrium 03/30/2021   Overflow incontinence of urine 03/30/2021   Diverticular hemorrhage    Rectal bleeding 01/23/2021   Painless rectal bleeding 01/22/2021   Dementia (HCC) 01/22/2021   Abnormal gait 10/12/2019   Palpitations 06/13/2019   SVT (supraventricular tachycardia)    Sinus bradycardia    Osteoarthritis of both knees    Macular degeneration    Allergy    Advanced dry age-related macular degeneration of both eyes with subfoveal involvement 03/03/2016   Hypertensive retinopathy of both eyes 03/03/2016   Pseudophakia of both eyes 03/03/2016   PVD (posterior vitreous detachment), both eyes 03/03/2016   Orthostasis 01/19/2016   Dilated aortic root 01/19/2016   PSVT (paroxysmal supraventricular tachycardia) 01/19/2016   Mild  aortic insufficiency 01/19/2016   Sinus bradycardia seen on cardiac monitor 01/18/2016   Postnasal drip 11/05/2014   Age-related osteoporosis without current pathological fracture 11/13/2013   Incontinence of feces 11/01/2013   Tobacco user 11/01/2013   Nicotine dependence, unspecified, in remission 11/01/2013   Bradycardia 01/30/2013   Syncope and collapse 01/12/2013   BPH (benign prostatic hyperplasia) 01/12/2013   Personal history of transient ischemic attack (TIA), and cerebral infarction without residual deficits 01/13/2012   History of stroke without residual deficits 01/13/2012   Osteoarthritis 05/21/2011   Lower urinary tract symptoms due to benign prostatic hyperplasia 05/21/2011   PCP:  Loreli Elsie [name], MD Pharmacy:   [facility] [place] - [address] [place] [address] Phone: [phone] Fax: [phone]     Social Drivers of Health (SDOH) Social History: SDOH Screenings   Food Insecurity: No Food Insecurity (03/16/2024)  Housing: Low Risk  (03/16/2024)  Transportation Needs: No Transportation Needs (03/16/2024)  Utilities: Not At Risk (03/16/2024)  Tobacco Use: Medium Risk (03/06/2024)   SDOH Interventions: Food Insecurity Interventions: Intervention Not Indicated, Inpatient TOC Housing Interventions: Intervention Not Indicated, Inpatient TOC Transportation Interventions: Intervention Not Indicated, Inpatient TOC Utilities Interventions: Intervention Not Indicated, Inpatient TOC   Readmission Risk Interventions    03/16/2024    9:51 AM 10/19/2023    2:57 PM  Readmission Risk Prevention Plan  Post Dischage Appt  Complete  Medication Screening  Complete  Transportation Screening Complete Complete  PCP or Specialist Appt within 5-7 Days Complete   Home Care Screening Complete   Medication Review (RN CM) Complete

## 2024-03-16 NOTE — Progress Notes (Signed)
 PROGRESS NOTE    Kevin Page  FMW:[id number] DOB: [birth year] DOA: 03/15/2024 PCP: Loreli Elsie [name], MD   Brief Narrative:  89 y.o. male with medical history significant of dementia oriented only to self at baseline, BPH, hypertension, hyperlipidemia, osteoarthritis, bradycardia presented with altered mental status from SNF.  On presentation, he was initially intermittently hypoxic but was afebrile.  Initial lactic acid was 2.7 but improved to 1.8 with IV fluids.  WBC of 8.4.  UA from 03/14/2024 was suggestive of UTI.  He was started on IV fluids and antibiotics.  CT head was negative for any acute intracranial abnormality.  Assessment & Plan:   UTI: Present on admission Sepsis: Present on admission - Presented with altered mental status possibly from UTI.  UA from 03/14/2024 showed possible UTI.  Patient was more confused on presentation with lactic acidosis, initial hypotension - Follow-up blood and urine cultures.  Continue IV fluids and Rocephin .  Leukocytosis - Monitor   Acute metabolic encephalopathy Dementia -Presented with worsening confusion possibly from UTI in a patient with dementia.  Concern for seizure as well.  Fall precautions.  Seizure/delirium precautions.  CT head on presentation as well was negative for any intracranial abnormality.  EEG pending. - PT/OT/SLP eval -- B12, folate, ammonia and TSH levels normal  Goals of care - Wife requesting palliative care consultation.     DVT prophylaxis: Lovenox  Code Status: DNR Family Communication: Wife at bedside Disposition Plan: Status is: Inpatient Remains inpatient appropriate because: Of severity of illness    Consultants: Consult palliative care  Procedures: None  Antimicrobials: Rocephin  from 03/15/2024 onwards   Subjective: Patient seen and examined at bedside.  Poor historian.  No seizures, vomiting, agitation reported.  Objective: Vitals:   03/15/24 1811 03/15/24 1811 03/15/24 2300 03/16/24  0622  BP:  (!) 121/53 (!) 144/89 (!) 101/56  Pulse:  69 70 74  Resp:  (!) 22 20 16   Temp:  98.4 F (36.9 C) 97.9 F (36.6 C) 98.4 F (36.9 C)  TempSrc:  Oral Oral Oral  SpO2:   100% 94%  Weight: 58.1 kg     Height: 6' (1.829 m)       Intake/Output Summary (Last 24 hours) at 03/16/2024 0835 Last data filed at 03/16/2024 9367 Gross per 24 hour  Intake 1732.8 ml  Output --  Net 1732.8 ml   Filed Weights   03/15/24 1811  Weight: 58.1 kg    Examination:  General exam: Appears calm and comfortable.  Elderly male lying in bed. Respiratory system: Bilateral decreased breath sounds at bases, no wheezing Cardiovascular system: S1 & S2 heard, Rate controlled Gastrointestinal system: Abdomen is nondistended, soft and nontender. Normal bowel sounds heard. Extremities: No cyanosis, clubbing, edema  Central nervous system: Awake, slow to respond, slightly confused.  Poor historian.  No focal neurological deficits. Moving extremities Skin: No rashes, lesions or ulcers Psychiatry: Affect is mildly flat.  Not agitated currently.    Data Reviewed: I have personally reviewed following labs and imaging studies  CBC: Recent Labs  Lab 03/14/24 0847 03/15/24 1152 03/16/24 0534  WBC 8.2 8.4 14.5*  NEUTROABS 6.3 7.1  --   HGB 13.5 10.2* 11.1*  HCT 42.1 32.2* 34.6*  MCV 94.2 97.9 93.5  PLT 298 206 280   Basic Metabolic Panel: Recent Labs  Lab 03/14/24 0847 03/15/24 1152 03/16/24 0534  NA 136 137 137  K 3.8 4.1 4.0  CL 99 101 101  CO2 27 24 24   GLUCOSE 100* 95 93  BUN 8 10 10   CREATININE 0.83 0.87 0.76  CALCIUM 9.3 8.9 8.3*  MG  --  2.1  --    GFR: Estimated Creatinine Clearance: 49.4 mL/min (by C-G formula based on SCr of 0.76 mg/dL). Liver Function Tests: Recent Labs  Lab 03/14/24 0847 03/15/24 1152 03/16/24 0534  AST 34 32 29  ALT 14 11 12   ALKPHOS 120 117 102  BILITOT 0.6 0.9 0.8  PROT 6.2* 5.7* 5.0*  ALBUMIN 3.6 3.2* 2.9*   No results for input(s):  LIPASE, AMYLASE in the last 168 hours. Recent Labs  Lab 03/16/24 0534  AMMONIA 24   Coagulation Profile: No results for input(s): INR, PROTIME in the last 168 hours. Cardiac Enzymes: Recent Labs  Lab 03/14/24 0847 03/15/24 1152  CKTOTAL 202 123   BNP (last 3 results) No results for input(s): PROBNP in the last 8760 hours. HbA1C: No results for input(s): HGBA1C in the last 72 hours. CBG: Recent Labs  Lab 03/14/24 0852  GLUCAP 82   Lipid Profile: No results for input(s): CHOL, HDL, LDLCALC, TRIG, CHOLHDL, LDLDIRECT in the last 72 hours. Thyroid  Function Tests: Recent Labs    03/16/24 0534  TSH 0.704   Anemia Panel: Recent Labs    03/16/24 0534  VITAMINB12 664  FOLATE 7.9   Sepsis Labs: Recent Labs  Lab 03/15/24 1159 03/15/24 1204 03/15/24 1330 03/15/24 1600  LATICACIDVEN 2.7* 2.7* 1.8 1.7    Recent Results (from the past 240 hours)  Culture, blood (single)     Status: None (Preliminary result)   Collection Time: 03/15/24  1:23 PM   Specimen: BLOOD  Result Value Ref Range Status   Specimen Description   Final    BLOOD A-LINE Performed at [facility], [address] [place], [place] [address]    Special Requests   Final    BOTTLES DRAWN AEROBIC AND ANAEROBIC Blood Culture results may not be optimal due to an inadequate volume of blood received in culture bottles Performed at [facility], [address] [place], [place] [address]    Culture   Final    NO GROWTH < 12 HOURS Performed at [facility], [address], [place], [place] [address]    Report Status PENDING  Incomplete  Blood culture (routine x 2)     Status: None (Preliminary result)   Collection Time: 03/15/24  7:35 PM   Specimen: BLOOD  Result Value Ref Range Status   Specimen Description   Final    BLOOD BLOOD LEFT ARM Performed at [facility], [address], [place], [place] [address]     Special Requests   Final    Blood Culture adequate volume BOTTLES DRAWN AEROBIC AND ANAEROBIC Performed at [facility], [address] [place], [place] [address]    Culture   Final    NO GROWTH < 12 HOURS Performed at [facility], [address] [place], [place] [address]    Report Status PENDING  Incomplete  Blood culture (routine x 2)     Status: None (Preliminary result)   Collection Time: 03/15/24  7:40 PM   Specimen: BLOOD  Result Value Ref Range Status   Specimen Description BLOOD SITE NOT SPECIFIED  Final   Special Requests   Final    BOTTLES DRAWN AEROBIC ONLY Blood Culture adequate volume   Culture   Final    NO GROWTH < 12 HOURS Performed at [facility],  [address], [place], [place] [address]    Report Status PENDING  Incomplete         Radiology Studies: CT Head Wo Contrast Result Date: 03/15/2024 EXAM: CT HEAD WITHOUT CONTRAST 03/15/2024 01:48:50 PM TECHNIQUE: CT of the head was performed without the administration of intravenous contrast. Automated exposure control, iterative reconstruction, and/or weight based adjustment of the mA/kV was utilized to reduce the radiation dose to as low as reasonably achievable. COMPARISON: 03/14/2024 CLINICAL HISTORY: Mental status change, unknown cause. FINDINGS: BRAIN AND VENTRICLES: No acute hemorrhage. No evidence of acute infarct. No hydrocephalus. No extra-axial collection. No mass effect or midline shift. There is diffuse cerebral atrophy and chronic small vessel disease throughout the deep white matter. ORBITS: No acute abnormality. SINUSES: No acute abnormality. SOFT TISSUES AND SKULL: No acute soft tissue abnormality. No skull fracture. IMPRESSION: 1. No acute intracranial abnormality. 2. Diffuse cerebral atrophy. 3. Chronic small vessel disease throughout the deep white matter. Electronically signed by: Franky Crease MD 03/15/2024 02:38 PM EDT RP Workstation: [id number]   CT Head Wo  Contrast Result Date: 03/14/2024 EXAM: CT HEAD WITHOUT CONTRAST 03/14/2024 08:47:00 AM TECHNIQUE: CT of the head was performed without the administration of intravenous contrast. Automated exposure control, iterative reconstruction, and/or weight based adjustment of the mA/kV was utilized to reduce the radiation dose to as low as reasonably achievable. COMPARISON: CT head Mar 06, 2024 CLINICAL HISTORY: Dementia, query first time seizure. Patient arrives by EMS, per [facility] patient had a shaking episode for 5 minutes while they were changing him in bed. Baseline history of dementia. FINDINGS: BRAIN AND VENTRICLES: Motion limited study. No acute hemorrhage. No evidence of acute infarct. No hydrocephalus. No extra-axial collection. No mass effect or midline shift. Cerebral atrophy. ORBITS: No acute abnormality. SINUSES: No acute abnormality. SOFT TISSUES AND SKULL: No acute soft tissue abnormality. No skull fracture. IMPRESSION: 1. No acute intracranial abnormality on motion limited assessment. 2. Cerebral atrophy. Electronically signed by: Gilmore Molt MD 03/14/2024 09:05 AM EDT RP Workstation: [id number]        Scheduled Meds:  enoxaparin  (LOVENOX ) injection  40 mg Subcutaneous Q24H   mouth rinse  15 mL Mouth Rinse 4 times per day   Continuous Infusions:  lactated ringers 150 mL/hr at 03/16/24 9367          Sophie Mao, MD [facility] 03/16/2024, 8:35 AM

## 2024-03-17 DIAGNOSIS — Z7189 Other specified counseling: Secondary | ICD-10-CM

## 2024-03-17 DIAGNOSIS — R531 Weakness: Secondary | ICD-10-CM | POA: Diagnosis not present

## 2024-03-17 DIAGNOSIS — N3000 Acute cystitis without hematuria: Secondary | ICD-10-CM | POA: Diagnosis not present

## 2024-03-17 DIAGNOSIS — Z515 Encounter for palliative care: Secondary | ICD-10-CM

## 2024-03-17 LAB — URINE CULTURE
Culture: >=100000 — AB
Culture: >=100000 — AB

## 2024-03-17 LAB — CBC WITH DIFFERENTIAL/PLATELET
Abs Immature Granulocytes: 0.03 K/uL (ref 0.00–0.07)
Basophils Absolute: 0.1 K/uL (ref 0.0–0.1)
Basophils Relative: 1 %
Eosinophils Absolute: 0.2 K/uL (ref 0.0–0.5)
Eosinophils Relative: 3 %
HCT: 36.2 % — ABNORMAL LOW (ref 39.0–52.0)
Hemoglobin: 11.9 g/dL — ABNORMAL LOW (ref 13.0–17.0)
Immature Granulocytes: 0 %
Lymphocytes Relative: 15 %
Lymphs Abs: 1.2 K/uL (ref 0.7–4.0)
MCH: 31.4 pg (ref 26.0–34.0)
MCHC: 32.9 g/dL (ref 30.0–36.0)
MCV: 95.5 fL (ref 80.0–100.0)
Monocytes Absolute: 0.7 K/uL (ref 0.1–1.0)
Monocytes Relative: 9 %
Neutro Abs: 5.5 K/uL (ref 1.7–7.7)
Neutrophils Relative %: 72 %
Platelets: 251 K/uL (ref 150–400)
RBC: 3.79 MIL/uL — ABNORMAL LOW (ref 4.22–5.81)
RDW: 14 % (ref 11.5–15.5)
WBC: 7.7 K/uL (ref 4.0–10.5)
nRBC: 0 % (ref 0.0–0.2)

## 2024-03-17 LAB — BASIC METABOLIC PANEL WITH GFR
Anion gap: 10 (ref 5–15)
BUN: 11 mg/dL (ref 8–23)
CO2: 25 mmol/L (ref 22–32)
Calcium: 8.1 mg/dL — ABNORMAL LOW (ref 8.9–10.3)
Chloride: 105 mmol/L (ref 98–111)
Creatinine, Ser: 0.65 mg/dL (ref 0.61–1.24)
GFR, Estimated: >60 mL/min (ref >60)
Glucose, Bld: 84 mg/dL (ref 70–99)
Potassium: 3.8 mmol/L (ref 3.5–5.1)
Sodium: 139 mmol/L (ref 135–145)

## 2024-03-17 LAB — MAGNESIUM: Magnesium: 1.8 mg/dL (ref 1.7–2.4)

## 2024-03-17 MED ORDER — CEFADROXIL 500 MG PO CAPS
500.0000 mg | ORAL_CAPSULE | Freq: Two times a day (BID) | ORAL | Status: AC
  Administered 2024-03-18 – 2024-03-21 (×8): 500 mg via ORAL
  Filled 2024-03-17 (×9): qty 1

## 2024-03-17 NOTE — Progress Notes (Addendum)
 OT Cancellation Note  Patient Details Name: Dominico Rod MRN: [id number] DOB: [birth year]   Cancelled Treatment:    Reason Eval/Treat Not Completed: Patient declined, no reason specified.  Discussed evaluation with patient's spouse, who politely declined services based upon intention to transition to palliative care.  OT to sign off.  Belvie NOVAK Katesha Eichel 03/17/2024, 2:20 PM

## 2024-03-17 NOTE — [place] FL2 (Signed)
 [place]  MEDICAID FL2 LEVEL OF CARE FORM     IDENTIFICATION  Patient Name: Kevin Page Birthdate: 16-Dec-1930 Sex: male Admission Date (Current Location): 03/15/2024  [facility] and [place] Number:  Producer, Television/film/video and Address:  [facility],  [address], [place] [address]      Provider Number: [id number]  Attending Physician Name and Address:  Cheryle Page, MD  Relative Name and Phone Number:  [name] Commisso(spouse)tel#[phone]    Current Level of Care: Hospital Recommended Level of Care: Nursing Facility Prior Approval Number:    Date Approved/Denied:   PASRR Number: [id number]  Discharge Plan:  (LTC)    Current Diagnoses: Patient Active Problem List   Diagnosis Date Noted   Chronic right shoulder pain 10/20/2023   UTI (urinary tract infection) 10/18/2023   Chronic venous insufficiency 10/18/2023   PAD (peripheral artery disease) 10/18/2023   Compression fracture of thoracic vertebra (HCC) 01/07/2022   Low back pain 12/17/2021   Abnormal EKG 10/07/2021   Expressive aphasia 10/07/2021   Iron deficiency anemia due to chronic blood loss 10/07/2021   TIA (transient ischemic attack) 10/07/2021   Transient alteration of awareness 10/07/2021   Physical deconditioning 08/02/2021   GI bleed 08/01/2021   Anemia, GI Bleed 07/31/2021   B12 deficiency 07/31/2021   Elevated troponin 07/31/2021   Dysphagia 03/30/2021   Disequilibrium 03/30/2021   Overflow incontinence of urine 03/30/2021   Diverticular hemorrhage    Rectal bleeding 01/23/2021   Painless rectal bleeding 01/22/2021   Dementia (HCC) 01/22/2021   Abnormal gait 10/12/2019   Palpitations 06/13/2019   SVT (supraventricular tachycardia)    Sinus bradycardia    Osteoarthritis of both knees    Macular degeneration    Allergy    Advanced dry age-related macular degeneration of both eyes with subfoveal involvement 03/03/2016   Hypertensive retinopathy of both eyes 03/03/2016    Pseudophakia of both eyes 03/03/2016   PVD (posterior vitreous detachment), both eyes 03/03/2016   Orthostasis 01/19/2016   Dilated aortic root 01/19/2016   PSVT (paroxysmal supraventricular tachycardia) 01/19/2016   Mild aortic insufficiency 01/19/2016   Sinus bradycardia seen on cardiac monitor 01/18/2016   Postnasal drip 11/05/2014   Age-related osteoporosis without current pathological fracture 11/13/2013   Incontinence of feces 11/01/2013   Tobacco user 11/01/2013   Nicotine dependence, unspecified, in remission 11/01/2013   Bradycardia 01/30/2013   Syncope and collapse 01/12/2013   BPH (benign prostatic hyperplasia) 01/12/2013   Personal history of transient ischemic attack (TIA), and cerebral infarction without residual deficits 01/13/2012   History of stroke without residual deficits 01/13/2012   Osteoarthritis 05/21/2011   Lower urinary tract symptoms due to benign prostatic hyperplasia 05/21/2011    Orientation RESPIRATION BLADDER Height & Weight     Self  Normal Incontinent Weight: 58.1 kg Height:  6' (182.9 cm)  BEHAVIORAL SYMPTOMS/MOOD NEUROLOGICAL BOWEL NUTRITION STATUS      Incontinent Diet (Dysphagia 3)  AMBULATORY STATUS COMMUNICATION OF NEEDS Skin   Total Care Verbally Normal                       Personal Care Assistance Level of Assistance  Bathing, Feeding, Dressing Bathing Assistance: Maximum assistance Feeding assistance: Limited assistance Dressing Assistance: Maximum assistance     Functional Limitations Info  Sight, Hearing, Speech Sight Info: Impaired (eyeglasses) Hearing Info: Adequate Speech Info: Adequate    SPECIAL CARE FACTORS FREQUENCY  PT (By licensed PT), OT (By licensed OT)  PT Frequency: 2x week OT Frequency: 2x week            Contractures Contractures Info: Not present    Additional Factors Info  Code Status, Allergies Code Status Info: DNR Allergies Info: NKDA           Current Medications (03/17/2024):   This is the current hospital active medication list Current Facility-Administered Medications  Medication Dose Route Frequency Provider Last Rate Last Admin   0.9 %  sodium chloride  infusion   Intravenous Continuous Cheryle Page, MD 50 mL/hr at 03/17/24 1351 New Bag at 03/17/24 1351   acetaminophen  (TYLENOL ) tablet 650 mg  650 mg Oral Q6H PRN Cheryle Page, MD       Or   acetaminophen  (TYLENOL ) suppository 650 mg  650 mg Rectal Q6H PRN Cheryle, Kshitiz, MD       albuterol (PROVENTIL) (2.5 MG/3ML) 0.083% nebulizer solution 2.5 mg  2.5 mg Nebulization Q2H PRN Cheryle Page, MD       NOREEN ON 03/18/2024] cefadroxil  (DURICEF) capsule 500 mg  500 mg Oral BID Cheryle Page, MD       donepezil  (ARICEPT ) tablet 10 mg  10 mg Oral QHS Alekh, Kshitiz, MD   10 mg at 03/16/24 2138   enoxaparin  (LOVENOX ) injection 40 mg  40 mg Subcutaneous Q24H Alekh, Kshitiz, MD   40 mg at 03/16/24 1751   ondansetron  (ZOFRAN ) tablet 4 mg  4 mg Oral Q6H PRN Cheryle Page, MD       Or   ondansetron  (ZOFRAN ) injection 4 mg  4 mg Intravenous Q6H PRN Cheryle Page, MD       Oral care mouth rinse  15 mL Mouth Rinse 4 times per day Cheryle Page, MD   15 mL at 03/17/24 1138   Oral care mouth rinse  15 mL Mouth Rinse PRN Cheryle Page, MD       senna-docusate (Senokot-S) tablet 1 tablet  1 tablet Oral QHS PRN Cheryle Page, MD         Discharge Medications: Please see discharge summary for a list of discharge medications.  Relevant Imaging Results:  Relevant Lab Results:   Additional Information SS#[id number], [place], [place]

## 2024-03-17 NOTE — Plan of Care (Signed)
  Problem: Education: Goal: Knowledge of General Education information will improve Description: Including pain rating scale, medication(s)/side effects and non-pharmacologic comfort measures Outcome: Not Progressing   Problem: Clinical Measurements: Goal: Ability to maintain clinical measurements within normal limits will improve Outcome: Progressing Goal: Will remain free from infection Outcome: Progressing Goal: Diagnostic test results will improve Outcome: Progressing Goal: Respiratory complications will improve Outcome: Progressing Goal: Cardiovascular complication will be avoided Outcome: Progressing

## 2024-03-17 NOTE — Progress Notes (Signed)
 Speech Language Pathology Treatment: Dysphagia  Patient Details Name: Kevin Page MRN: [id number] DOB: 28-Nov-1930 Today's Date: 03/17/2024 Time: 8896-8875 SLP Time Calculation (min) (ACUTE ONLY): 21 min  Assessment / Plan / Recommendation Clinical Impression  SLP followed up for education session and further diet tolerance assessment. Pt sleeping upon arrival. He woke briefly and declined PO trials at this time. Wife reported that he ate a few bites of breakfast, though held off once he started coughing with bites of banana. We discussed option to downgrade diet further vs maintain current diet with selection of softer foods and mostly liquids. Reviewed some menu options with wife that pt may be able to swallow easier such as tomato soup and mashed potatoes. She will also try macNcheese, which would not be available on the D2 diet if we were to downgrade his diet. Wife is well aware of aspiration risk and careful hand feeding strategies and positioning strategies to reduce aspiration risk.   SLP education complete at this time. SLP will sign off at this time. Available for re-consult if further education needs arise or if goals of care change.    HPI HPI: Per MD note Kevin Page is a 89 y.o. male with medical history significant of dementia oriented only to self at baseline, BPH, hypertension, hyperlipidemia, osteoarthritis, bradycardia presented with altered mental status from memory care.  Patient seen in ED and was diagnosed as having had UTI and is s/p ABX treatment.  Patient was found to have altered mental status again today associated with cyanosis.  Has prior h/o dysphagia - c/b DG esoph 03/2021 Limited examination. 2. Esophageal dysmotility..No esophageal mass or hiatal hernia.  4. No GE reflux demonstrated.  MBS as an OP 2023 showed vallecular retention with appearance of cervical ostephyte.  Wife present and reports it has been a long goodbye and reports they have seen palliative in the  past but not recently. She is willing to pursue palliative input again per direct ? from SLP. Wife reports pt coughs all the time which she associates with post nasal drainage.      SLP Plan  All goals met;Discharge SLP treatment due to (comment)          Recommendations  Diet recommendations: Dysphagia 3 (mechanical soft);Thin liquid Liquids provided via: Cup;Straw Medication Administration: Whole meds with puree Supervision: Trained caregiver to feed patient;Staff to assist with self feeding;Full supervision/cueing for compensatory strategies Compensations: Slow rate;Small sips/bites (hold PO if coughing; fully awake) Postural Changes and/or Swallow Maneuvers: Seated upright 90 degrees (as tolerated)                  Oral care QID   Frequent or constant Supervision/Assistance Dysphagia, oropharyngeal phase (R13.12)     All goals met;Discharge SLP treatment due to (comment)     Kevin Page  03/17/2024, 1:02 PM

## 2024-03-17 NOTE — Evaluation (Signed)
 Physical Therapy Evaluation Patient Details Name: Kevin Page MRN: [id number] DOB: 12-05-1930 Today's Date: 03/17/2024  History of Present Illness  Patient is a 89 year old male presents to therapy following hospitalization on 03/15/2024 due to AMS. Pt was admitted with sepsis secondary to UTI. PMH: dementia, macular degeneration, lumbar fusion, SVT.  Clinical Impression    Pt admitted with above diagnosis.  Pt currently with functional limitations due to the deficits listed below (see PT Problem List). Pt in bed when PT arrived. Spouse present. Pt unable to provide insight to PLOF, spouse reports pt has been residing at [facility] in memory care unit and requires A x 2 for transfer tasks to wc. Pt required min A for supine to sit with use of hospital bed, mod A for scooting to EOB with bed pad, total A for squat pivot transfer to recliner. Pt left in recliner, all needs in place and nursing staff aware of recommendation of stedy with A x 2 to return to bed.  Patient will benefit from continued inpatient follow up therapy, <3 hours/day. Pt will benefit from acute skilled PT to increase their independence and safety with mobility to allow discharge.         If plan is discharge home, recommend the following: Two people to help with walking and/or transfers;A lot of help with bathing/dressing/bathroom;Assistance with cooking/housework;Assist for transportation;Direct supervision/assist for medications management;Direct supervision/assist for financial management   Can travel by private vehicle   No    Equipment Recommendations None recommended by PT (TBD in next venue)  Recommendations for Other Services       Functional Status Assessment Patient has had a recent decline in their functional status and demonstrates the ability to make significant improvements in function in a reasonable and predictable amount of time.     Precautions / Restrictions Precautions Precautions:  Fall Restrictions Weight Bearing Restrictions Per Provider Order: No      Mobility  Bed Mobility Overal bed mobility: Needs Assistance Bed Mobility: Supine to Sit     Supine to sit: Min assist, HOB elevated     General bed mobility comments: min A for trunk, tactle cues for LEs to EOB and mod A for scooting with pad to EOB    Transfers Overall transfer level: Needs assistance Equipment used: None Transfers: Bed to chair/wheelchair/BSC       Squat pivot transfers: Total assist, From elevated surface     General transfer comment: pt able to reach and follow one step commands with increased time however pt unable to demonstrate abiltiy to power up and maintained B knee hip and trunk flexion and absent abiltiy to advace LEs required for pivoting. pt will require mechanical lift and A x 2 for nursing staff and pt safety to return to bed    Ambulation/Gait               General Gait Details: NT spouse indicates pt has been non-ambulatory for ~ 2 years  Stairs            Wheelchair Mobility     Tilt Bed    Modified Rankin (Stroke Patients Only)       Balance Overall balance assessment: History of Falls, Needs assistance Sitting-balance support: Feet supported Sitting balance-Leahy Scale: Good     Standing balance support: Bilateral upper extremity supported, During functional activity Standing balance-Leahy Scale: Zero  Pertinent Vitals/Pain Pain Assessment Pain Assessment: No/denies pain (no apparent pain response)    Home Living Family/patient expects to be discharged to:: Skilled nursing facility ([facility] memory care)                   Additional Comments: SPT A x 2 bed <> wc    Prior Function Prior Level of Function : Needs assist  Cognitive Assist : Mobility (cognitive);ADLs (cognitive)     Physical Assist : Mobility (physical);ADLs (physical)     Mobility Comments: pt is A x 2 for  transfer tasks bed <> wc ADLs Comments: A for all ADLs and self care tasks     Extremity/Trunk Assessment        Lower Extremity Assessment Lower Extremity Assessment: Generalized weakness    Cervical / Trunk Assessment Cervical / Trunk Assessment: Normal  Communication   Communication Communication: Impaired Factors Affecting Communication: Difficulty expressing self    Cognition Arousal: Alert Behavior During Therapy: Flat affect   PT - Cognitive impairments: History of cognitive impairments, Memory, Attention, Orientation, Initiation   Orientation impairments: Person                     Following commands: Impaired Following commands impaired: Follows one step commands with increased time, Follows multi-step commands inconsistently     Cueing       General Comments General comments (skin integrity, edema, etc.): productive cough during eval, IV became disloged from R UE during the course of transfer tasks and nursing staff aware    Exercises     Assessment/Plan    PT Assessment Patient needs continued PT services  PT Problem List Decreased strength;Decreased activity tolerance;Decreased balance;Decreased mobility;Decreased coordination;Decreased cognition       PT Treatment Interventions Functional mobility training;Therapeutic activities;Therapeutic exercise;Balance training;Neuromuscular re-education;Cognitive remediation;Patient/family education    PT Goals (Current goals can be found in the Care Plan section)  Acute Rehab PT Goals PT Goal Formulation: Patient unable to participate in goal setting    Frequency Min 2X/week     Co-evaluation               AM-PAC PT 6 Clicks Mobility  Outcome Measure Help needed turning from your back to your side while in a flat bed without using bedrails?: A Little Help needed moving from lying on your back to sitting on the side of a flat bed without using bedrails?: A Little Help needed moving to  and from a bed to a chair (including a wheelchair)?: A Lot Help needed standing up from a chair using your arms (e.g., wheelchair or bedside chair)?: A Lot Help needed to walk in hospital room?: Total Help needed climbing 3-5 steps with a railing? : Total 6 Click Score: 12    End of Session Equipment Utilized During Treatment: Gait belt Activity Tolerance: Patient limited by fatigue Patient left: in chair;with call bell/phone within reach Nurse Communication: Mobility status;Need for lift equipment PT Visit Diagnosis: Unsteadiness on feet (R26.81);Other abnormalities of gait and mobility (R26.89);Muscle weakness (generalized) (M62.81);History of falling (Z91.81);Difficulty in walking, not elsewhere classified (R26.2)    Time: 8865-8841 PT Time Calculation (min) (ACUTE ONLY): 24 min   Charges:   PT Evaluation $PT Eval Low Complexity: 1 Low PT Treatments $Therapeutic Activity: 8-22 mins PT General Charges $$ ACUTE PT VISIT: 1 Visit         [place], PT Acute Rehab   [place] VEAR Drone 03/17/2024, 12:41 PM

## 2024-03-17 NOTE — Progress Notes (Signed)
 PT Note and discharge  Patient Details Name: Kevin Page MRN: [id number] DOB: [birth year]   Cancelled Treatment:    Reason Eval/Treat Not Completed: Other (comment). PT made aware later in the day, pt and family are seeking palliative care and no longer requesting therapy services. PT to sign off at this time. Thank you for this referral.   [place], PT Acute Rehab   [place] VEAR Drone 03/17/2024, 3:08 PM

## 2024-03-17 NOTE — Consult Note (Signed)
 Consultation Note Date: 03/17/2024   Patient Name: Kevin Page  DOB: June 30, 1930  MRN: [id number]  Age / Sex: 89 y.o., male  PCP: Loreli Elsie [name], MD Referring Physician: Cheryle Page, MD  Reason for Consultation: Establishing goals of care  HPI/Patient Profile: 89 y.o. male   admitted on 03/15/2024   89 y.o. male with medical history significant of dementia oriented only to self at baseline, BPH, hypertension, hyperlipidemia, osteoarthritis, bradycardia presented with altered mental status from SNF.   Clinical Assessment and Goals of Care:  Palliative medicine is specialized medical care for people living with serious illness. It focuses on providing relief from the symptoms and stress of a serious illness. The goal is to improve quality of life for both the patient and the family. Goals of care: Broad aims of medical therapy in relation to the patient's values and preferences. Our aim is to provide medical care aimed at enabling patients to achieve the goals that matter most to them, given the circumstances of their particular medical situation and their constraints.   Admitted with UTI,staph epidermidis bacteremia: Most likely contaminant, acute metabolic encephalopathy, chronic dysphagia and aspiration risk.  I met with the patient and his wife at bedside.dementia trajectory and decline discussed.disposition options discussed.  Patient with advanced dementia demonstrating progressive dysphagia, poor oral intake, weight loss, and overall functional decline consistent with late-stage disease trajectory. The patient is bedbound, minimally verbal, and dependent for all ADLs. Clinical picture suggests end-stage dementia with expected loss of swallowing function and increasing aspiration risk. Patient's wife is aware of the natural disease progression and that artificial nutrition or hydration would not  improve quality of life or survival. Comfort feeding as tolerated has been recommended, with emphasis on mouth care, symptom control, and avoidance of burdensome interventions. Prognosis is poor, with likely limited life expectancy if current decline continues. Palliative care has been recommended at long term facility.     HCPOA  Wife   SUMMARY OF RECOMMENDATIONS   Goals of care discussions with wife at bedside:  Long term care facility with palliative services  Agree with DNR DNI Code Status/Advance Care Planning: DNR   Symptom Management:     Palliative Prophylaxis:  Delirium Protocol  Additional Recommendations (Limitations, Scope, Preferences): Avoid Hospitalization  Psycho-social/Spiritual:  Desire for further Chaplaincy support:yes Additional Recommendations: Caregiving  Support/Resources  Prognosis:  < 12 months  Discharge Planning: long term facility with palliative.       Primary Diagnoses: Present on Admission:  UTI (urinary tract infection)   I have reviewed the medical record, interviewed the patient and family, and examined the patient. The following aspects are pertinent.  Past Medical History:  Diagnosis Date   Allergy    dust and mites   BPH (benign prostatic hyperplasia)    Essential hypertension    Hyperlipidemia    Macular degeneration    Osteoarthritis of both knees    Sinus bradycardia    SVT (supraventricular tachycardia)    a. Event monitor 2014: few episodes of SVT, longest  12 beats with rate 128.   Syncope    Social History   Socioeconomic History   Marital status: Married    Spouse name: Not on file   Number of children: Not on file   Years of education: Not on file   Highest education level: Not on file  Occupational History   Not on file  Tobacco Use   Smoking status: Former   Smokeless tobacco: Never   Tobacco comments:    Smoked for 44 years, quit 1990s  Vaping Use   Vaping status: Never Used  Substance and Sexual  Activity   Alcohol  use: Yes    Alcohol /week: 12.0 standard drinks of alcohol     Types: 12 Standard drinks or equivalent per week    Comment: 1 drink of gin nightly, occasionally more if going out to dinner.   Drug use: No   Sexual activity: Not on file  Other Topics Concern   Not on file  Social History Narrative   Not on file   Social Drivers of Health   Financial Resource Strain: Not on file  Food Insecurity: No Food Insecurity (03/16/2024)   Hunger Vital Sign    Worried About Running Out of Food in the Last Year: Never true    Ran Out of Food in the Last Year: Never true  Transportation Needs: No Transportation Needs (03/16/2024)   PRAPARE - Administrator, Civil Service (Medical): No    Lack of Transportation (Non-Medical): No  Physical Activity: Not on file  Stress: Not on file  Social Connections: Not on file   Family History  Problem Relation Age of Onset   Cancer Brother        tonsils   Heart attack Neg Hx    Scheduled Meds:  donepezil   10 mg Oral QHS   enoxaparin  (LOVENOX ) injection  40 mg Subcutaneous Q24H   mouth rinse  15 mL Mouth Rinse 4 times per day   Continuous Infusions:  sodium chloride  50 mL/hr at 03/17/24 0845   cefTRIAXone  (ROCEPHIN )  IV 2 g (03/17/24 0847)   PRN Meds:.acetaminophen  **OR** acetaminophen , albuterol, ondansetron  **OR** ondansetron  (ZOFRAN ) IV, mouth rinse, senna-docusate Medications Prior to Admission:  Prior to Admission medications   Medication Sig Start Date End Date Taking? Authorizing Provider  Cholecalciferol  (VITAMIN D3 SUPER STRENGTH) 50 MCG (2000 UT) CAPS Take 2,000 Units by mouth in the morning.   Yes [provider]  donepezil  (ARICEPT ) 10 MG tablet Take 10 mg by mouth at bedtime. 03/28/17  Yes [provider]  dutasteride  (AVODART ) 0.5 MG capsule Take 0.5 mg by mouth in the morning.   Yes [provider]  guaiFENesin  (MUCINEX ) 600 MG 12 hr tablet Take 1 tablet (600 mg total) by  mouth 2 (two) times daily. Patient taking differently: Take 600 mg by mouth daily as needed for cough (or congestion). 01/25/21  Yes Jerri Keys, MD  Multiple Vitamins-Minerals (MACULAR VITAMIN BENEFIT PO) Take 1 capsule by mouth See admin instructions. Macular Protect Complete - Take 2 capsules by mouth in the morning   Yes [provider]  pantoprazole  (PROTONIX ) 40 MG tablet Take 40 mg by mouth daily before breakfast.   Yes [provider]  Psyllium (METAMUCIL PO) Take 2 capsules by mouth in the morning.   Yes [provider]  triamcinolone  cream (KENALOG ) 0.1 % Apply 1 Application topically daily as needed (for itching- right side, shoulder, and arm).   Yes [provider]  cefdinir (OMNICEF) 300  MG capsule Take 1 capsule (300 mg total) by mouth 2 (two) times daily for 7 days. Patient not taking: Reported on 03/15/2024 03/14/24 03/21/24  Elnor Savant A, DO  pantoprazole  (PROTONIX ) 40 MG tablet Take 1 tablet (40 mg total) by mouth daily. Patient not taking: Reported on 03/15/2024 08/25/21 03/15/24  Cherlyn Labella, MD   No Known Allergies Review of Systems +weakness  Physical Exam Elderly gentleman resting in bed Generalized weakness Trace edema Regular work of breathing  Vital Signs: BP (!) 152/81 (BP Location: Left Arm)   Pulse (!) 106   Temp 97.9 F (36.6 C)   Resp 16   Ht 6' (1.829 m)   Wt 58.1 kg   SpO2 (!) 81%   BMI 17.37 kg/m  Pain Scale: PAINAD POSS *See Group Information*: 1-Acceptable,Awake and alert     SpO2: SpO2: (!) 81 % O2 Device:SpO2: (!) 81 % O2 Flow Rate: .   IO: Intake/output summary:  Intake/Output Summary (Last 24 hours) at 03/17/2024 1325 Last data filed at 03/17/2024 0600 Gross per 24 hour  Intake 1571.46 ml  Output 200 ml  Net 1371.46 ml    LBM: Last BM Date : 03/16/24 Baseline Weight: Weight: 58.1 kg Most recent weight: Weight: 58.1 kg     Palliative Assessment/Data:   PPS 50%  Time In:  12 Time Out:   1315 Time Total:  75 Greater than 50%  of this time was spent counseling and coordinating care related to the above assessment and plan.  Signed by: Lonia Serve, MD   Please contact Palliative Medicine Team phone at [phone] for questions and concerns.  For individual provider: See Tracey

## 2024-03-17 NOTE — Progress Notes (Signed)
 PROGRESS NOTE    Kevin Page  FMW:[id number] DOB: 1930-09-03 DOA: 03/15/2024 PCP: Loreli Elsie [name], MD   Brief Narrative:  89 y.o. male with medical history significant of dementia oriented only to self at baseline, BPH, hypertension, hyperlipidemia, osteoarthritis, bradycardia presented with altered mental status from SNF.  On presentation, he was initially intermittently hypoxic but was afebrile.  Initial lactic acid was 2.7 but improved to 1.8 with IV fluids.  WBC of 8.4.  UA from 03/14/2024 was suggestive of UTI.  He was started on IV fluids and antibiotics.  CT head was negative for any acute intracranial abnormality.  Palliative care consulted as per family request.  Assessment & Plan:   UTI: Present on admission Sepsis: Present on admission Staph epidermidis bacteremia: Most likely contaminant - Presented with altered mental status possibly from UTI.  UA from 03/14/2024 showed possible UTI.  Patient was more confused on presentation with lactic acidosis, initial hypotension - Urine culture growing E. coli.  Follow sensitivities.  Blood culture grew staph epi in 1 bottle: Most likely contaminant.  Continue IV fluids and Rocephin .  Leukocytosis - Resolved   Acute metabolic encephalopathy Dementia Dysphagia -Presented with worsening confusion possibly from UTI in a patient with dementia.  Concern for seizure as well.  Fall precautions.  Seizure/delirium precautions.  CT head on presentation as well was negative for any intracranial abnormality.  No seizures since admission.  EEG discontinued on 03/16/2024 as per family request. - PT/OT eval -Diet as per SLP recommendations.  Patient is at severe aspiration risk -- B12, folate, ammonia and TSH levels normal  Goals of care - Wife requesting palliative care consultation which is pending.     DVT prophylaxis: Lovenox  Code Status: DNR Family Communication: Wife at bedside Disposition Plan: Status is: Inpatient Remains  inpatient appropriate because: Of severity of illness    Consultants: palliative care  Procedures: None  Antimicrobials: Rocephin  from 03/15/2024 onwards   Subjective: Patient seen and examined at bedside.  Poor historian.  No fever, vomiting, agitation reported. Objective: Vitals:   03/16/24 0622 03/16/24 1403 03/16/24 2003 03/17/24 0558  BP: (!) 101/56 115/60 (!) 156/81 115/70  Pulse: 74 63 72 64  Resp: 16 16 18 18   Temp: 98.4 F (36.9 C) 98.3 F (36.8 C) 98.4 F (36.9 C) 98.3 F (36.8 C)  TempSrc: Oral  Oral Oral  SpO2: 94% 94% 95% 94%  Weight:      Height:        Intake/Output Summary (Last 24 hours) at 03/17/2024 0806 Last data filed at 03/17/2024 0600 Gross per 24 hour  Intake 1931.46 ml  Output 200 ml  Net 1731.46 ml   Filed Weights   03/15/24 1811  Weight: 58.1 kg    Examination:  General: On room air.  No distress ENT/neck: No thyromegaly.  JVD is not elevated  respiratory: Decreased breath sounds at bases bilaterally with some crackles; no wheezing  CVS: S1-S2 heard, rate controlled currently Abdominal: Soft, nontender, slightly distended; no organomegaly,  bowel sounds are heard Extremities: Trace lower extremity edema; no cyanosis  CNS: Alert; still extremely slow to respond and confused.  Extremely poor historian.  No focal neurologic deficit.  Moves extremities Lymph: No obvious lymphadenopathy Skin: No obvious ecchymosis/lesions  psych: Mostly flat affect with no signs of agitation  musculoskeletal: No obvious joint swelling/deformity     Data Reviewed: I have personally reviewed following labs and imaging studies  CBC: Recent Labs  Lab 03/14/24 0847 03/15/24 1152 03/16/24 0534  03/17/24 0521  WBC 8.2 8.4 14.5* 7.7  NEUTROABS 6.3 7.1  --  5.5  HGB 13.5 10.2* 11.1* 11.9*  HCT 42.1 32.2* 34.6* 36.2*  MCV 94.2 97.9 93.5 95.5  PLT 298 206 280 251   Basic Metabolic Panel: Recent Labs  Lab 03/14/24 0847 03/15/24 1152  03/16/24 0534 03/17/24 0521  NA 136 137 137 139  K 3.8 4.1 4.0 3.8  CL 99 101 101 105  CO2 27 24 24 25   GLUCOSE 100* 95 93 84  BUN 8 10 10 11   CREATININE 0.83 0.87 0.76 0.65  CALCIUM 9.3 8.9 8.3* 8.1*  MG  --  2.1  --  1.8   GFR: Estimated Creatinine Clearance: 49.4 mL/min (by C-G formula based on SCr of 0.65 mg/dL). Liver Function Tests: Recent Labs  Lab 03/14/24 0847 03/15/24 1152 03/16/24 0534  AST 34 32 29  ALT 14 11 12   ALKPHOS 120 117 102  BILITOT 0.6 0.9 0.8  PROT 6.2* 5.7* 5.0*  ALBUMIN 3.6 3.2* 2.9*   No results for input(s): LIPASE, AMYLASE in the last 168 hours. Recent Labs  Lab 03/16/24 0534  AMMONIA 24   Coagulation Profile: No results for input(s): INR, PROTIME in the last 168 hours. Cardiac Enzymes: Recent Labs  Lab 03/14/24 0847 03/15/24 1152  CKTOTAL 202 123   BNP (last 3 results) No results for input(s): PROBNP in the last 8760 hours. HbA1C: No results for input(s): HGBA1C in the last 72 hours. CBG: Recent Labs  Lab 03/14/24 0852  GLUCAP 82   Lipid Profile: No results for input(s): CHOL, HDL, LDLCALC, TRIG, CHOLHDL, LDLDIRECT in the last 72 hours. Thyroid  Function Tests: Recent Labs    03/16/24 0534  TSH 0.704   Anemia Panel: Recent Labs    03/16/24 0534  VITAMINB12 664  FOLATE 7.9   Sepsis Labs: Recent Labs  Lab 03/15/24 1159 03/15/24 1204 03/15/24 1330 03/15/24 1600  LATICACIDVEN 2.7* 2.7* 1.8 1.7    Recent Results (from the past 240 hours)  Urine Culture     Status: Abnormal (Preliminary result)   Collection Time: 03/14/24  1:16 PM   Specimen: Urine, Random  Result Value Ref Range Status   Specimen Description   Final    URINE, RANDOM Performed at [facility], [address], [place], [place] [address]    Special Requests   Final    NONE Reflexed from [id number] Performed at [facility], [address], [place], [place] [address]    Culture (A)   Final    >=100,000 COLONIES/mL ESCHERICHIA COLI SUSCEPTIBILITIES TO FOLLOW Performed at [facility], [address], [place], [place] [address]    Report Status PENDING  Incomplete  Urine Culture (for pregnant, neutropenic or urologic patients or patients with an indwelling urinary catheter)     Status: Abnormal (Preliminary result)   Collection Time: 03/14/24  1:16 PM   Specimen: Urine, Clean Catch  Result Value Ref Range Status   Specimen Description   Final    URINE, CLEAN CATCH Performed at [facility], [address], [place], [place] [address]    Special Requests   Final    NONE Performed at [facility], [address], [place], [place] [address]    Culture (A)  Final    >=100,000 COLONIES/mL ESCHERICHIA COLI SUSCEPTIBILITIES TO FOLLOW Performed at [facility], [address], [place], [place] [address]    Report Status PENDING  Incomplete  Culture, blood (single)     Status: None (Preliminary result)   Collection Time: 03/15/24  1:23 PM   Specimen: BLOOD  Result Value Ref Range Status   Specimen Description   Final    BLOOD A-LINE Performed at [facility], [address] [place], [place] [address]    Special Requests   Final    BOTTLES DRAWN AEROBIC AND ANAEROBIC Blood Culture results may not be optimal due to an inadequate volume of blood received in culture bottles Performed at [facility], [address] [place], [place] [address]    Culture  Setup Time   Final    GRAM POSITIVE COCCI IN CLUSTERS ANAEROBIC BOTTLE ONLY CRITICAL RESULT CALLED TO, READ BACK BY AND VERIFIED WITH: PHARMD E JACKSON 03/16/2024 @ 2225 BY AB Performed at [facility], [address] [place], [place] [address]    Culture GRAM POSITIVE COCCI IN CLUSTERS  Final   Report Status PENDING  Incomplete  Blood Culture ID Panel (Reflexed)     Status: Abnormal   Collection Time: 03/15/24  1:23 PM   Result Value Ref Range Status   Enterococcus faecalis NOT DETECTED NOT DETECTED Final   Enterococcus Faecium NOT DETECTED NOT DETECTED Final   Listeria monocytogenes NOT DETECTED NOT DETECTED Final   Staphylococcus species DETECTED (A) NOT DETECTED Final    Comment: CRITICAL RESULT CALLED TO, READ BACK BY AND VERIFIED WITH: PHARMD E JACKSON 03/16/2024 @ 2225 BY AB    Staphylococcus aureus (BCID) NOT DETECTED NOT DETECTED Final   Staphylococcus epidermidis DETECTED (A) NOT DETECTED Final    Comment: CRITICAL RESULT CALLED TO, READ BACK BY AND VERIFIED WITH: PHARMD E JACKSON 03/16/2024 @ 2225 BY AB    Staphylococcus lugdunensis NOT DETECTED NOT DETECTED Final   Streptococcus species NOT DETECTED NOT DETECTED Final   Streptococcus agalactiae NOT DETECTED NOT DETECTED Final   Streptococcus pneumoniae NOT DETECTED NOT DETECTED Final   Streptococcus pyogenes NOT DETECTED NOT DETECTED Final   A.calcoaceticus-baumannii NOT DETECTED NOT DETECTED Final   Bacteroides fragilis NOT DETECTED NOT DETECTED Final   Enterobacterales NOT DETECTED NOT DETECTED Final   Enterobacter cloacae complex NOT DETECTED NOT DETECTED Final   Escherichia coli NOT DETECTED NOT DETECTED Final   Klebsiella aerogenes NOT DETECTED NOT DETECTED Final   Klebsiella oxytoca NOT DETECTED NOT DETECTED Final   Klebsiella pneumoniae NOT DETECTED NOT DETECTED Final   Proteus species NOT DETECTED NOT DETECTED Final   Salmonella species NOT DETECTED NOT DETECTED Final   Serratia marcescens NOT DETECTED NOT DETECTED Final   Haemophilus influenzae NOT DETECTED NOT DETECTED Final   Neisseria meningitidis NOT DETECTED NOT DETECTED Final   Pseudomonas aeruginosa NOT DETECTED NOT DETECTED Final   Stenotrophomonas maltophilia NOT DETECTED NOT DETECTED Final   Candida albicans NOT DETECTED NOT DETECTED Final   Candida auris NOT DETECTED NOT DETECTED Final   Candida glabrata NOT DETECTED NOT DETECTED Final   Candida krusei NOT DETECTED  NOT DETECTED Final   Candida parapsilosis NOT DETECTED NOT DETECTED Final   Candida tropicalis NOT DETECTED NOT DETECTED Final   Cryptococcus neoformans/gattii NOT DETECTED NOT DETECTED Final   Methicillin resistance mecA/C NOT DETECTED NOT DETECTED Final    Comment: Performed at [facility], [address], [place], [place] [address]  Blood culture (routine x 2)     Status: None (Preliminary result)   Collection Time: 03/15/24  7:35 PM   Specimen: BLOOD  Result Value Ref Range  Status   Specimen Description   Final    BLOOD BLOOD LEFT ARM Performed at [facility], [address] [place], [place] [address]    Special Requests   Final    Blood Culture adequate volume BOTTLES DRAWN AEROBIC AND ANAEROBIC Performed at [facility], [address] [place], [place] [address]    Culture   Final    NO GROWTH < 12 HOURS Performed at [facility], [address] [place], [place] [address]    Report Status PENDING  Incomplete  Blood culture (routine x 2)     Status: None (Preliminary result)   Collection Time: 03/15/24  7:40 PM   Specimen: BLOOD  Result Value Ref Range Status   Specimen Description BLOOD SITE NOT SPECIFIED  Final   Special Requests   Final    BOTTLES DRAWN AEROBIC ONLY Blood Culture adequate volume   Culture   Final    NO GROWTH < 12 HOURS Performed at [facility], [address], [place], [place] [address]    Report Status PENDING  Incomplete         Radiology Studies: CT Head Wo Contrast Result Date: 03/15/2024 EXAM: CT HEAD WITHOUT CONTRAST 03/15/2024 01:48:50 PM TECHNIQUE: CT of the head was performed without the administration of intravenous contrast. Automated exposure control, iterative reconstruction, and/or weight based adjustment of the mA/kV was utilized to reduce the radiation dose to as low as reasonably achievable. COMPARISON: 03/14/2024 CLINICAL HISTORY: Mental status change, unknown cause.  FINDINGS: BRAIN AND VENTRICLES: No acute hemorrhage. No evidence of acute infarct. No hydrocephalus. No extra-axial collection. No mass effect or midline shift. There is diffuse cerebral atrophy and chronic small vessel disease throughout the deep white matter. ORBITS: No acute abnormality. SINUSES: No acute abnormality. SOFT TISSUES AND SKULL: No acute soft tissue abnormality. No skull fracture. IMPRESSION: 1. No acute intracranial abnormality. 2. Diffuse cerebral atrophy. 3. Chronic small vessel disease throughout the deep white matter. Electronically signed by: Kevin Dover MD 03/15/2024 02:38 PM EDT RP Workstation: [id number]        Scheduled Meds:  donepezil   10 mg Oral QHS   enoxaparin  (LOVENOX ) injection  40 mg Subcutaneous Q24H   mouth rinse  15 mL Mouth Rinse 4 times per day   Continuous Infusions:  sodium chloride  75 mL/hr at 03/17/24 0600   cefTRIAXone  (ROCEPHIN )  IV Stopped (03/16/24 1126)          Sophie Mao, MD [facility] 03/17/2024, 8:06 AM

## 2024-03-17 NOTE — Plan of Care (Signed)
  Problem: SLP Dysphagia Goals Goal: Patient will utilize recommended strategies Description: Patient will utilize recommended strategies during swallow to increase swallowing safety with Outcome: Adequate for Discharge Flowsheets (Taken 03/16/2024 1050 by Nicolas Emmie LABOR, CCC-SLP) Patient will utilize recommended strategies during swallow to increase swallowing safety with: (with spouse acceptance of aspiration risk for comfort po)  modified independence  mod assist

## 2024-03-17 NOTE — TOC Progression Note (Addendum)
 Transition of Care [facility]) - Progression Note    Patient Details  Name: Kevin Page MRN: [id number] Date of Birth: 02/21/1931  Transition of [facility]) CM/SW Contact  Jabaree Mercado, Nathanel, RN Phone Number: 03/17/2024, 11:58 AM  Clinical Narrative: Received message to call Brenda([employer] csw) requested by [name] spouse to talk to about d/c plans-they prefer patient to go to LTC(ICM it is a case by case asst with LTC process) ; informed Brenda-we will await recc & asst with d/c plans.awaiting Palliative care meeting & recc.[facility] rep also following. PTAR @ d/c. Await response from [facility] rep Nena [phone] will have her supv Therisa to contact me. Continue to follow. Patient wears eyglasses, incontin b/b, indep w/feeding,2+asst w/transfers;w/c bound. -2p for the hospital to provide the correct level of care since patient came from an ALF/memory care. We would need to do our process of PT/OT to eval if family wants an fl2 for the next level of care-LTC,ST SNF/ALF/memory care etc.  -3:19p-spoke to spouse about d/c plans & recc-PT-recc LTC. Palliative care recc-PCS 2 d/c. Faxed out for LTC await choices-email choices to  spouse @ [email or web address]   Expected Discharge Plan:  (TBD) Barriers to Discharge: Continued Medical Work up               Expected Discharge Plan and Services   Discharge Planning Services: CM Consult Post Acute Care Choice:  (TBD) Living arrangements for the past 2 months: Assisted Living Facility (memory care)                 DME Arranged: N/A DME Agency: NA       HH Arranged: NA HH Agency: NA         Social Drivers of Health (SDOH) Interventions SDOH Screenings   Food Insecurity: No Food Insecurity (03/16/2024)  Housing: Low Risk  (03/16/2024)  Transportation Needs: No Transportation Needs (03/16/2024)  Utilities: Not At Risk (03/16/2024)  Tobacco Use: Medium Risk (03/06/2024)    Readmission Risk Interventions     03/16/2024    9:51 AM 10/19/2023    2:57 PM  Readmission Risk Prevention Plan  Post Dischage Appt  Complete  Medication Screening  Complete  Transportation Screening Complete Complete  PCP or Specialist Appt within 5-7 Days Complete   Home Care Screening Complete   Medication Review (RN CM) Complete

## 2024-03-18 ENCOUNTER — Telehealth (HOSPITAL_BASED_OUTPATIENT_CLINIC_OR_DEPARTMENT_OTHER): Payer: Self-pay | Admitting: *Deleted

## 2024-03-18 DIAGNOSIS — Z515 Encounter for palliative care: Secondary | ICD-10-CM

## 2024-03-18 DIAGNOSIS — Z7189 Other specified counseling: Secondary | ICD-10-CM | POA: Diagnosis not present

## 2024-03-18 DIAGNOSIS — N3000 Acute cystitis without hematuria: Secondary | ICD-10-CM | POA: Diagnosis not present

## 2024-03-18 LAB — CULTURE, BLOOD (SINGLE)

## 2024-03-18 MED ORDER — ENSURE PLUS HIGH PROTEIN PO LIQD
237.0000 mL | Freq: Two times a day (BID) | ORAL | Status: DC
  Administered 2024-03-18 – 2024-03-22 (×7): 237 mL via ORAL

## 2024-03-18 NOTE — Telephone Encounter (Signed)
 Post ED Visit - Positive Culture Follow-up  Culture report reviewed by antimicrobial stewardship pharmacist: [facility] []  [name], Pharm.D. []  Venetia Gully, Pharm.D., BCPS AQ-ID []  Garrel Crews, Pharm.D., BCPS []  Almarie Lunger, Pharm.D., BCPS []  [place], [address].D., BCPS, AAHIVP []  Rosaline Bihari, Pharm.D., BCPS, AAHIVP []  Vernell Meier, PharmD, BCPS []  Latanya Hint, PharmD, BCPS []  Donald Medley, PharmD, BCPS []  Rocky Bold, PharmD []  Dorothyann Alert, PharmD, BCPS []  Morene Babe, PharmD  [facility] []  Rosaline Edison, PharmD []  Romona Bliss, PharmD []  Dolphus Roller, PharmD []  Veva Seip, Rph []  Vernell Daunt) Leonce, PharmD []  Eva Allis, PharmD []  Rosaline Millet, PharmD []  Iantha Batch, PharmD []  Arvin Gauss, PharmD []  Wanda Hasting, PharmD [x]  Ronal Rav, PharmD []  Rocky Slade, PharmD []  Bard Jeans, PharmD   Positive urine culture Pt currently admitted at [facility] On appropriate antibiotics. No intervention needed  Kevin Page 03/18/2024, 12:20 PM

## 2024-03-18 NOTE — Plan of Care (Signed)
  Problem: Health Behavior/Discharge Planning: Goal: Ability to manage health-related needs will improve Outcome: Not Progressing   Problem: Clinical Measurements: Goal: Ability to maintain clinical measurements within normal limits will improve Outcome: Progressing Goal: Will remain free from infection Outcome: Progressing Goal: Diagnostic test results will improve Outcome: Progressing Goal: Respiratory complications will improve Outcome: Progressing Goal: Cardiovascular complication will be avoided Outcome: Progressing

## 2024-03-18 NOTE — Progress Notes (Signed)
 Daily Progress Note   Patient Name: Kevin Page       Date: 03/18/2024 DOB: Oct 14, 1930  Age: 89 y.o. MRN#: [id number] Attending Physician: Cheryle Page, MD Primary Care Physician: Loreli Elsie [name], MD Admit Date: 03/15/2024  Reason for Consultation/Follow-up: Establishing goals of care  Subjective:  Awake this am, resting in bed, wife and son at bedside Oral intake - around 30 % of meals, tolerating some Ensure.   Length of Stay: 3  Current Medications: Scheduled Meds:   cefadroxil   500 mg Oral BID   donepezil   10 mg Oral QHS   enoxaparin  (LOVENOX ) injection  40 mg Subcutaneous Q24H   feeding supplement  237 mL Oral BID BM   mouth rinse  15 mL Mouth Rinse 4 times per day    Continuous Infusions:   PRN Meds: acetaminophen  **OR** acetaminophen , albuterol, ondansetron  **OR** ondansetron  (ZOFRAN ) IV, mouth rinse, senna-docusate  Physical Exam         Awake alert no distress Appears with flat affect Elderly appearing gentleman  Vital Signs: BP 139/72 (BP Location: Left Arm)   Pulse 65   Temp 98 F (36.7 C)   Resp 18   Ht 6' (1.829 m)   Wt 58.1 kg   SpO2 93%   BMI 17.37 kg/m  SpO2: SpO2: 93 % O2 Device: O2 Device: Room Air O2 Flow Rate:    Intake/output summary:  Intake/Output Summary (Last 24 hours) at 03/18/2024 1205 Last data filed at 03/18/2024 9471 Gross per 24 hour  Intake 1570.39 ml  Output 1100 ml  Net 470.39 ml   LBM: Last BM Date : 03/16/24 Baseline Weight: Weight: 58.1 kg Most recent weight: Weight: 58.1 kg       Palliative Assessment/Data:      Patient Active Problem List   Diagnosis Date Noted   Chronic right shoulder pain 10/20/2023   UTI (urinary tract infection) 10/18/2023   Chronic venous insufficiency 10/18/2023   PAD  (peripheral artery disease) 10/18/2023   Compression fracture of thoracic vertebra (HCC) 01/07/2022   Low back pain 12/17/2021   Abnormal EKG 10/07/2021   Expressive aphasia 10/07/2021   Iron deficiency anemia due to chronic blood loss 10/07/2021   TIA (transient ischemic attack) 10/07/2021   Transient alteration of awareness 10/07/2021   Physical deconditioning 08/02/2021   GI bleed  08/01/2021   Anemia, GI Bleed 07/31/2021   B12 deficiency 07/31/2021   Elevated troponin 07/31/2021   Dysphagia 03/30/2021   Disequilibrium 03/30/2021   Overflow incontinence of urine 03/30/2021   Diverticular hemorrhage    Rectal bleeding 01/23/2021   Painless rectal bleeding 01/22/2021   Dementia (HCC) 01/22/2021   Abnormal gait 10/12/2019   Palpitations 06/13/2019   SVT (supraventricular tachycardia)    Sinus bradycardia    Osteoarthritis of both knees    Macular degeneration    Allergy    Advanced dry age-related macular degeneration of both eyes with subfoveal involvement 03/03/2016   Hypertensive retinopathy of both eyes 03/03/2016   Pseudophakia of both eyes 03/03/2016   PVD (posterior vitreous detachment), both eyes 03/03/2016   Orthostasis 01/19/2016   Dilated aortic root 01/19/2016   PSVT (paroxysmal supraventricular tachycardia) 01/19/2016   Mild aortic insufficiency 01/19/2016   Sinus bradycardia seen on cardiac monitor 01/18/2016   Postnasal drip 11/05/2014   Age-related osteoporosis without current pathological fracture 11/13/2013   Incontinence of feces 11/01/2013   Tobacco user 11/01/2013   Nicotine dependence, unspecified, in remission 11/01/2013   Bradycardia 01/30/2013   Syncope and collapse 01/12/2013   BPH (benign prostatic hyperplasia) 01/12/2013   Personal history of transient ischemic attack (TIA), and cerebral infarction without residual deficits 01/13/2012   History of stroke without residual deficits 01/13/2012   Osteoarthritis 05/21/2011   Lower urinary tract  symptoms due to benign prostatic hyperplasia 05/21/2011    Palliative Care Assessment & Plan   Patient Profile:  89 y.o. male   admitted on 03/15/2024   89 y.o. male with medical history significant of dementia oriented only to self at baseline, BPH, hypertension, hyperlipidemia, osteoarthritis, bradycardia presented with altered mental status from SNF.   Assessment:  Patient with advanced dementia demonstrating progressive dysphagia, poor oral intake, weight loss, and overall functional decline consistent with late-stage disease trajectory. The patient is bedbound, minimally verbal, and dependent for all ADLs. Clinical picture suggests end-stage dementia with expected loss of swallowing function and increasing aspiration risk. Patient's wife is aware of the natural disease progression and that artificial nutrition or hydration would not improve quality of life or survival. Comfort feeding as tolerated has been recommended, with emphasis on mouth care, symptom control, and avoidance of burdensome interventions. Prognosis is poor, with likely limited life expectancy if current decline continues. Palliative care has been recommended at long term facility.   Recommendations/Plan:   Long term care with hospice.   Code Status:    Code Status Orders  (From admission, onward)           Start     Ordered   03/15/24 1457  Do not attempt resuscitation (DNR)- Limited -Do Not Intubate (DNI)  Continuous       Question Answer Comment  If pulseless and not breathing No CPR or chest compressions.   In Pre-Arrest Conditions (Patient Is Breathing and Has A Pulse) Do not intubate. Provide all appropriate non-invasive medical interventions. Avoid ICU transfer unless indicated or required.   Consent: Discussion documented in EHR or advanced directives reviewed      03/15/24 1459           Code Status History     Date Active Date Inactive Code Status Order ID Comments User Context   10/18/2023 2049  10/20/2023 1717 Limited: Do not attempt resuscitation (DNR) -DNR-LIMITED -Do Not Intubate/DNI  [id number]  Arthea Child, MD ED   07/31/2021 1351 08/08/2021 1818 DNR [id number]  Waddell,  Isaiah, MD ED   01/22/2021 1630 01/30/2021 2154 Full Code [id number]  Laurence Locus, DO Inpatient   01/22/2021 1611 01/22/2021 1630 Full Code [id number]  Laurence Locus, DO Inpatient   01/18/2016 2252 01/20/2016 1508 Full Code [id number]  Charlton Evalene RAMAN, MD Inpatient       Prognosis:  < 12 months  Discharge Planning: LTC with hospice   Care plan was discussed with wife and son.   Thank you for allowing the Palliative Medicine Team to assist in the care of this patient. High MDM.      Greater than 50%  of this time was spent counseling and coordinating care related to the above assessment and plan.  Lonia Serve, MD  Please contact Palliative Medicine Team phone at [phone] for questions and concerns.

## 2024-03-18 NOTE — Progress Notes (Addendum)
 PROGRESS NOTE    Kevin Page  FMW:[id number] DOB: May 23, 1930 DOA: 03/15/2024 PCP: Kevin Elsie [name], MD   Brief Narrative:  89 y.o. male with medical history significant of dementia oriented only to self at baseline, BPH, hypertension, hyperlipidemia, osteoarthritis, bradycardia presented with altered mental status from SNF.  On presentation, he was initially intermittently hypoxic but was afebrile.  Initial lactic acid was 2.7 but improved to 1.8 with IV fluids.  WBC of 8.4.  UA from 03/14/2024 was suggestive of UTI.  He was started on IV fluids and antibiotics.  CT head was negative for any acute intracranial abnormality.  Palliative care consulted as per family request.  Assessment & Plan:   UTI: Present on admission Severe sepsis: Present on admission Staph epidermidis bacteremia: Most likely contaminant - Presented with altered mental status possibly from UTI.  UA from 03/14/2024 showed possible UTI.  Patient was more confused on presentation with lactic acidosis, initial hypotension - Urine culture growing E. coli.  Blood culture grew staph epi in 1 bottle: Most likely contaminant.  Rocephin  has been switched to oral cefadroxil .  DC IV fluids today.  Encourage oral intake.  Leukocytosis - Resolved   Acute metabolic encephalopathy Dementia Dysphagia Underweight -Presented with worsening confusion possibly from UTI in a patient with dementia.  Concern for seizure as well.  Fall precautions.  Seizure/delirium precautions.  CT head on presentation as well was negative for any intracranial abnormality.  No seizures since admission.  EEG discontinued on 03/16/2024 as per family request. - Family do not want to pursue PT/OT eval. -Diet as per SLP recommendations.  Patient is at severe aspiration risk -- B12, folate, ammonia and TSH levels normal  Goals of care - Palliative care evaluation updated.  Patient will benefit from outpatient palliative care follow-up.     DVT  prophylaxis: Lovenox  Code Status: DNR Family Communication: Son at bedside Disposition Plan: Status is: Inpatient Remains inpatient appropriate because: Of severity of illness.  Patient will need LTC placement.  TOC following.    Consultants: palliative care  Procedures: None  Antimicrobials:  Anti-infectives (From admission, onward)    Start     Dose/Rate Route Frequency Ordered Stop   03/18/24 1000  cefadroxil  (DURICEF) capsule 500 mg        500 mg Oral 2 times daily 03/17/24 1335 03/22/24 0959   03/16/24 1000  cefTRIAXone  (ROCEPHIN ) 2 g in sodium chloride  0.9 % 100 mL IVPB  Status:  Discontinued        2 g 200 mL/hr over 30 Minutes Intravenous Daily 03/16/24 0941 03/17/24 1335   03/15/24 1330  cefTRIAXone  (ROCEPHIN ) 2 g in sodium chloride  0.9 % 100 mL IVPB        2 g 200 mL/hr over 30 Minutes Intravenous Once 03/15/24 1318 03/15/24 1510        Subjective: Patient seen and examined at bedside.  Poor historian.  No fever, vomiting, abdominal pain reported. objective: Vitals:   03/17/24 0558 03/17/24 1304 03/17/24 2035 03/18/24 0526  BP: 115/70 (!) 152/81 139/80 139/72  Pulse: 64 (!) 106 71 65  Resp: 18 16 18 18   Temp: 98.3 F (36.8 C) 97.9 F (36.6 C) 98.8 F (37.1 C) 98 F (36.7 C)  TempSrc: Oral     SpO2: 94% 94% 96% 93%  Weight:      Height:        Intake/Output Summary (Last 24 hours) at 03/18/2024 0811 Last data filed at 03/18/2024 0528 Gross per 24 hour  Intake 1570.39 ml  Output 1100 ml  Net 470.39 ml   Filed Weights   03/15/24 1811  Weight: 58.1 kg    Examination:  General: Remains on room air and in no distress.  Elderly male in bed.  Extremely slow to respond.  Poor historian.  Confused.  Flat affect.  respiratory: Bilateral decreased breath sounds at bases with scattered crackles CVS: Rate mostly controlled; S1 and S2 are heard Abdominal: Soft, nontender, distended mildly; no organomegaly,  bowel sounds are heard normally Extremities: No  clubbing; mild lower extremity edema present  Data Reviewed: I have personally reviewed following labs and imaging studies  CBC: Recent Labs  Lab 03/14/24 0847 03/15/24 1152 03/16/24 0534 03/17/24 0521  WBC 8.2 8.4 14.5* 7.7  NEUTROABS 6.3 7.1  --  5.5  HGB 13.5 10.2* 11.1* 11.9*  HCT 42.1 32.2* 34.6* 36.2*  MCV 94.2 97.9 93.5 95.5  PLT 298 206 280 251   Basic Metabolic Panel: Recent Labs  Lab 03/14/24 0847 03/15/24 1152 03/16/24 0534 03/17/24 0521  NA 136 137 137 139  K 3.8 4.1 4.0 3.8  CL 99 101 101 105  CO2 27 24 24 25   GLUCOSE 100* 95 93 84  BUN 8 10 10 11   CREATININE 0.83 0.87 0.76 0.65  CALCIUM 9.3 8.9 8.3* 8.1*  MG  --  2.1  --  1.8   GFR: Estimated Creatinine Clearance: 49.4 mL/min (by C-G formula based on SCr of 0.65 mg/dL). Liver Function Tests: Recent Labs  Lab 03/14/24 0847 03/15/24 1152 03/16/24 0534  AST 34 32 29  ALT 14 11 12   ALKPHOS 120 117 102  BILITOT 0.6 0.9 0.8  PROT 6.2* 5.7* 5.0*  ALBUMIN 3.6 3.2* 2.9*   No results for input(s): LIPASE, AMYLASE in the last 168 hours. Recent Labs  Lab 03/16/24 0534  AMMONIA 24   Coagulation Profile: No results for input(s): INR, PROTIME in the last 168 hours. Cardiac Enzymes: Recent Labs  Lab 03/14/24 0847 03/15/24 1152  CKTOTAL 202 123   BNP (last 3 results) No results for input(s): PROBNP in the last 8760 hours. HbA1C: No results for input(s): HGBA1C in the last 72 hours. CBG: Recent Labs  Lab 03/14/24 0852  GLUCAP 82   Lipid Profile: No results for input(s): CHOL, HDL, LDLCALC, TRIG, CHOLHDL, LDLDIRECT in the last 72 hours. Thyroid  Function Tests: Recent Labs    03/16/24 0534  TSH 0.704   Anemia Panel: Recent Labs    03/16/24 0534  VITAMINB12 664  FOLATE 7.9   Sepsis Labs: Recent Labs  Lab 03/15/24 1159 03/15/24 1204 03/15/24 1330 03/15/24 1600  LATICACIDVEN 2.7* 2.7* 1.8 1.7    Recent Results (from the past 240 hours)  Urine Culture      Status: Abnormal   Collection Time: 03/14/24  1:16 PM   Specimen: Urine, Random  Result Value Ref Range Status   Specimen Description   Final    URINE, RANDOM Performed at [facility], [address], [place], [place] [address]    Special Requests   Final    NONE Reflexed from [phone] Performed at [facility], [address] [place], [place] [address]    Culture (A)  Final    >=100,000 COLONIES/mL ESCHERICHIA COLI Two isolates with different morphologies were identified as the same organism.The most resistant organism was reported. Performed at [facility], [address], [place], [place] [address]    Report Status 03/17/2024 FINAL  Final  Organism ID, Bacteria ESCHERICHIA COLI (A)  Final      Susceptibility   Escherichia coli - MIC*    AMPICILLIN >=32 RESISTANT Resistant     CEFAZOLIN (URINE) Value in next row Sensitive      4 SENSITIVEThis is a modified FDA-approved test that has been validated and its performance characteristics determined by the reporting laboratory.  This laboratory is certified under the Clinical Laboratory Improvement Amendments CLIA as qualified to perform high complexity clinical laboratory testing.    CEFEPIME Value in next row Sensitive      4 SENSITIVEThis is a modified FDA-approved test that has been validated and its performance characteristics determined by the reporting laboratory.  This laboratory is certified under the Clinical Laboratory Improvement Amendments CLIA as qualified to perform high complexity clinical laboratory testing.    ERTAPENEM Value in next row Sensitive      4 SENSITIVEThis is a modified FDA-approved test that has been validated and its performance characteristics determined by the reporting laboratory.  This laboratory is certified under the Clinical Laboratory Improvement Amendments CLIA as qualified to perform high complexity clinical laboratory testing.    CEFTRIAXONE  Value in  next row Sensitive      4 SENSITIVEThis is a modified FDA-approved test that has been validated and its performance characteristics determined by the reporting laboratory.  This laboratory is certified under the Clinical Laboratory Improvement Amendments CLIA as qualified to perform high complexity clinical laboratory testing.    CIPROFLOXACIN Value in next row Intermediate      4 SENSITIVEThis is a modified FDA-approved test that has been validated and its performance characteristics determined by the reporting laboratory.  This laboratory is certified under the Clinical Laboratory Improvement Amendments CLIA as qualified to perform high complexity clinical laboratory testing.    GENTAMICIN Value in next row Sensitive      4 SENSITIVEThis is a modified FDA-approved test that has been validated and its performance characteristics determined by the reporting laboratory.  This laboratory is certified under the Clinical Laboratory Improvement Amendments CLIA as qualified to perform high complexity clinical laboratory testing.    NITROFURANTOIN Value in next row Sensitive      4 SENSITIVEThis is a modified FDA-approved test that has been validated and its performance characteristics determined by the reporting laboratory.  This laboratory is certified under the Clinical Laboratory Improvement Amendments CLIA as qualified to perform high complexity clinical laboratory testing.    TRIMETH/SULFA Value in next row Resistant      4 SENSITIVEThis is a modified FDA-approved test that has been validated and its performance characteristics determined by the reporting laboratory.  This laboratory is certified under the Clinical Laboratory Improvement Amendments CLIA as qualified to perform high complexity clinical laboratory testing.    AMPICILLIN/SULBACTAM Value in next row Intermediate      4 SENSITIVEThis is a modified FDA-approved test that has been validated and its performance characteristics determined by the  reporting laboratory.  This laboratory is certified under the Clinical Laboratory Improvement Amendments CLIA as qualified to perform high complexity clinical laboratory testing.    PIP/TAZO Value in next row Sensitive      <=4 SENSITIVEThis is a modified FDA-approved test that has been validated and its performance characteristics determined by the reporting laboratory.  This laboratory is certified under the Clinical Laboratory Improvement Amendments CLIA as qualified to perform high complexity clinical laboratory testing.    MEROPENEM Value in next row Sensitive      <=4 SENSITIVEThis is a modified  FDA-approved test that has been validated and its performance characteristics determined by the reporting laboratory.  This laboratory is certified under the Clinical Laboratory Improvement Amendments CLIA as qualified to perform high complexity clinical laboratory testing.    * >=100,000 COLONIES/mL ESCHERICHIA COLI  Urine Culture (for pregnant, neutropenic or urologic patients or patients with an indwelling urinary catheter)     Status: Abnormal   Collection Time: 03/14/24  1:16 PM   Specimen: Urine, Clean Catch  Result Value Ref Range Status   Specimen Description   Final    URINE, CLEAN CATCH Performed at [facility], [address] [place], [place] [address]    Special Requests   Final    NONE Performed at [facility], [address] [place], [place] [address]    Culture >=100,000 COLONIES/mL ESCHERICHIA COLI (A)  Final   Report Status 03/17/2024 FINAL  Final   Organism ID, Bacteria ESCHERICHIA COLI (A)  Final      Susceptibility   Escherichia coli - MIC*    AMPICILLIN >=32 RESISTANT Resistant     CEFAZOLIN (URINE) Value in next row Sensitive      4 SENSITIVEThis is a modified FDA-approved test that has been validated and its performance characteristics determined by the reporting laboratory.  This laboratory is certified under the Clinical  Laboratory Improvement Amendments CLIA as qualified to perform high complexity clinical laboratory testing.    CEFEPIME Value in next row Sensitive      4 SENSITIVEThis is a modified FDA-approved test that has been validated and its performance characteristics determined by the reporting laboratory.  This laboratory is certified under the Clinical Laboratory Improvement Amendments CLIA as qualified to perform high complexity clinical laboratory testing.    ERTAPENEM Value in next row Sensitive      4 SENSITIVEThis is a modified FDA-approved test that has been validated and its performance characteristics determined by the reporting laboratory.  This laboratory is certified under the Clinical Laboratory Improvement Amendments CLIA as qualified to perform high complexity clinical laboratory testing.    CEFTRIAXONE  Value in next row Sensitive      4 SENSITIVEThis is a modified FDA-approved test that has been validated and its performance characteristics determined by the reporting laboratory.  This laboratory is certified under the Clinical Laboratory Improvement Amendments CLIA as qualified to perform high complexity clinical laboratory testing.    CIPROFLOXACIN Value in next row Sensitive      4 SENSITIVEThis is a modified FDA-approved test that has been validated and its performance characteristics determined by the reporting laboratory.  This laboratory is certified under the Clinical Laboratory Improvement Amendments CLIA as qualified to perform high complexity clinical laboratory testing.    GENTAMICIN Value in next row Sensitive      4 SENSITIVEThis is a modified FDA-approved test that has been validated and its performance characteristics determined by the reporting laboratory.  This laboratory is certified under the Clinical Laboratory Improvement Amendments CLIA as qualified to perform high complexity clinical laboratory testing.    NITROFURANTOIN Value in next row Sensitive      4 SENSITIVEThis  is a modified FDA-approved test that has been validated and its performance characteristics determined by the reporting laboratory.  This laboratory is certified under the Clinical Laboratory Improvement Amendments CLIA as qualified to perform high complexity clinical laboratory testing.    TRIMETH/SULFA Value in next row Resistant      4 SENSITIVEThis is a modified FDA-approved test that has been validated and  its performance characteristics determined by the reporting laboratory.  This laboratory is certified under the Clinical Laboratory Improvement Amendments CLIA as qualified to perform high complexity clinical laboratory testing.    AMPICILLIN/SULBACTAM Value in next row Intermediate      4 SENSITIVEThis is a modified FDA-approved test that has been validated and its performance characteristics determined by the reporting laboratory.  This laboratory is certified under the Clinical Laboratory Improvement Amendments CLIA as qualified to perform high complexity clinical laboratory testing.    PIP/TAZO Value in next row Sensitive      <=4 SENSITIVEThis is a modified FDA-approved test that has been validated and its performance characteristics determined by the reporting laboratory.  This laboratory is certified under the Clinical Laboratory Improvement Amendments CLIA as qualified to perform high complexity clinical laboratory testing.    MEROPENEM Value in next row Sensitive      <=4 SENSITIVEThis is a modified FDA-approved test that has been validated and its performance characteristics determined by the reporting laboratory.  This laboratory is certified under the Clinical Laboratory Improvement Amendments CLIA as qualified to perform high complexity clinical laboratory testing.    * >=100,000 COLONIES/mL ESCHERICHIA COLI  Culture, blood (single)     Status: Abnormal (Preliminary result)   Collection Time: 03/15/24  1:23 PM   Specimen: BLOOD  Result Value Ref Range Status   Specimen Description    Final    BLOOD A-LINE Performed at [facility], [address] [place], [place] [address]    Special Requests   Final    BOTTLES DRAWN AEROBIC AND ANAEROBIC Blood Culture results may not be optimal due to an inadequate volume of blood received in culture bottles Performed at [facility], [address], [place], [place] [address]    Culture  Setup Time   Final    GRAM POSITIVE COCCI IN CLUSTERS ANAEROBIC BOTTLE ONLY CRITICAL RESULT CALLED TO, READ BACK BY AND VERIFIED WITH: PHARMD E JACKSON 03/16/2024 @ 2225 BY AB    Culture (A)  Final    STAPHYLOCOCCUS EPIDERMIDIS THE SIGNIFICANCE OF ISOLATING THIS ORGANISM FROM A SINGLE SET OF BLOOD CULTURES WHEN MULTIPLE SETS ARE DRAWN IS UNCERTAIN. PLEASE NOTIFY THE MICROBIOLOGY DEPARTMENT WITHIN ONE WEEK IF SPECIATION AND SENSITIVITIES ARE REQUIRED. Performed at [facility], [address] [place], [place] [address]    Report Status PENDING  Incomplete  Blood Culture ID Panel (Reflexed)     Status: Abnormal   Collection Time: 03/15/24  1:23 PM  Result Value Ref Range Status   Enterococcus faecalis NOT DETECTED NOT DETECTED Final   Enterococcus Faecium NOT DETECTED NOT DETECTED Final   Listeria monocytogenes NOT DETECTED NOT DETECTED Final   Staphylococcus species DETECTED (A) NOT DETECTED Final    Comment: CRITICAL RESULT CALLED TO, READ BACK BY AND VERIFIED WITH: PHARMD E JACKSON 03/16/2024 @ 2225 BY AB    Staphylococcus aureus (BCID) NOT DETECTED NOT DETECTED Final   Staphylococcus epidermidis DETECTED (A) NOT DETECTED Final    Comment: CRITICAL RESULT CALLED TO, READ BACK BY AND VERIFIED WITH: PHARMD E JACKSON 03/16/2024 @ 2225 BY AB    Staphylococcus lugdunensis NOT DETECTED NOT DETECTED Final   Streptococcus species NOT DETECTED NOT DETECTED Final   Streptococcus agalactiae NOT DETECTED NOT DETECTED Final   Streptococcus pneumoniae NOT DETECTED NOT DETECTED Final   Streptococcus  pyogenes NOT DETECTED NOT DETECTED Final   A.calcoaceticus-baumannii NOT DETECTED NOT DETECTED Final   Bacteroides fragilis NOT DETECTED NOT DETECTED  Final   Enterobacterales NOT DETECTED NOT DETECTED Final   Enterobacter cloacae complex NOT DETECTED NOT DETECTED Final   Escherichia coli NOT DETECTED NOT DETECTED Final   Klebsiella aerogenes NOT DETECTED NOT DETECTED Final   Klebsiella oxytoca NOT DETECTED NOT DETECTED Final   Klebsiella pneumoniae NOT DETECTED NOT DETECTED Final   Proteus species NOT DETECTED NOT DETECTED Final   Salmonella species NOT DETECTED NOT DETECTED Final   Serratia marcescens NOT DETECTED NOT DETECTED Final   Haemophilus influenzae NOT DETECTED NOT DETECTED Final   Neisseria meningitidis NOT DETECTED NOT DETECTED Final   Pseudomonas aeruginosa NOT DETECTED NOT DETECTED Final   Stenotrophomonas maltophilia NOT DETECTED NOT DETECTED Final   Candida albicans NOT DETECTED NOT DETECTED Final   Candida auris NOT DETECTED NOT DETECTED Final   Candida glabrata NOT DETECTED NOT DETECTED Final   Candida krusei NOT DETECTED NOT DETECTED Final   Candida parapsilosis NOT DETECTED NOT DETECTED Final   Candida tropicalis NOT DETECTED NOT DETECTED Final   Cryptococcus neoformans/gattii NOT DETECTED NOT DETECTED Final   Methicillin resistance mecA/C NOT DETECTED NOT DETECTED Final    Comment: Performed at [facility], [address], [place], [place] [address]  Blood culture (routine x 2)     Status: None (Preliminary result)   Collection Time: 03/15/24  7:35 PM   Specimen: BLOOD  Result Value Ref Range Status   Specimen Description   Final    BLOOD BLOOD LEFT ARM Performed at [facility], [address], [place], [place] [address]    Special Requests   Final    Blood Culture adequate volume BOTTLES DRAWN AEROBIC AND ANAEROBIC Performed at [facility], [address], [place], [place] [address]    Culture   Final    NO  GROWTH 2 DAYS Performed at [facility], [address] [place], [place] [address]    Report Status PENDING  Incomplete  Blood culture (routine x 2)     Status: None (Preliminary result)   Collection Time: 03/15/24  7:40 PM   Specimen: BLOOD  Result Value Ref Range Status   Specimen Description BLOOD SITE NOT SPECIFIED  Final   Special Requests   Final    BOTTLES DRAWN AEROBIC ONLY Blood Culture adequate volume   Culture   Final    NO GROWTH 2 DAYS Performed at [facility], [address], [place], [place] [address]    Report Status PENDING  Incomplete         Radiology Studies: No results found.       Scheduled Meds:  cefadroxil   500 mg Oral BID   donepezil   10 mg Oral QHS   enoxaparin  (LOVENOX ) injection  40 mg Subcutaneous Q24H   mouth rinse  15 mL Mouth Rinse 4 times per day   Continuous Infusions:  sodium chloride  50 mL/hr at 03/18/24 0400          Sophie Mao, MD [facility] 03/18/2024, 8:11 AM

## 2024-03-19 ENCOUNTER — Encounter (HOSPITAL_COMMUNITY): Payer: Self-pay | Admitting: Internal Medicine

## 2024-03-19 DIAGNOSIS — Z7189 Other specified counseling: Secondary | ICD-10-CM | POA: Diagnosis not present

## 2024-03-19 DIAGNOSIS — R531 Weakness: Secondary | ICD-10-CM

## 2024-03-19 DIAGNOSIS — N3000 Acute cystitis without hematuria: Secondary | ICD-10-CM | POA: Diagnosis not present

## 2024-03-19 DIAGNOSIS — Z515 Encounter for palliative care: Secondary | ICD-10-CM | POA: Diagnosis not present

## 2024-03-19 MED ORDER — CARMEX CLASSIC LIP BALM EX OINT
TOPICAL_OINTMENT | CUTANEOUS | Status: DC | PRN
  Filled 2024-03-19: qty 10

## 2024-03-19 NOTE — Plan of Care (Signed)
  Problem: Education: Goal: Knowledge of General Education information will improve Description: Including pain rating scale, medication(s)/side effects and non-pharmacologic comfort measures Outcome: Progressing   Problem: Health Behavior/Discharge Planning: Goal: Ability to manage health-related needs will improve Outcome: Progressing   Problem: Clinical Measurements: Goal: Ability to maintain clinical measurements within normal limits will improve Outcome: Progressing Goal: Will remain free from infection Outcome: Progressing Goal: Diagnostic test results will improve Outcome: Progressing Goal: Respiratory complications will improve Outcome: Progressing Goal: Cardiovascular complication will be avoided Outcome: Progressing   Problem: Activity: Goal: Risk for activity intolerance will decrease Outcome: Progressing   Problem: Elimination: Goal: Will not experience complications related to bowel motility 03/19/2024 0853 by Rosanne Elspeth HERO, RN Outcome: Progressing 03/19/2024 0853 by Rosanne Elspeth HERO, RN Outcome: Progressing Goal: Will not experience complications related to urinary retention 03/19/2024 0853 by Rosanne Elspeth HERO, RN Outcome: Progressing 03/19/2024 0853 by Rosanne Elspeth HERO, RN Outcome: Progressing   Problem: Pain Managment: Goal: General experience of comfort will improve and/or be controlled 03/19/2024 0853 by Rosanne Elspeth HERO, RN Outcome: Progressing 03/19/2024 0853 by Rosanne Elspeth HERO, RN Outcome: Progressing   Problem: Safety: Goal: Ability to remain free from injury will improve 03/19/2024 0853 by Rosanne Elspeth HERO, RN Outcome: Progressing 03/19/2024 0853 by Rosanne Elspeth HERO, RN Outcome: Progressing   Problem: Skin Integrity: Goal: Risk for impaired skin integrity will decrease 03/19/2024 0853 by Rosanne Elspeth HERO, RN Outcome: Progressing 03/19/2024 0853 by Rosanne Elspeth HERO, RN Outcome: Progressing

## 2024-03-19 NOTE — Progress Notes (Signed)
 Daily Progress Note   Patient Name: Kevin Page       Date: 03/19/2024 DOB: [birth year]  Age: 89 y.o. MRN#: [id number] Attending Physician: Cheryle Page, MD Primary Care Physician: Loreli Elsie [name], MD Admit Date: 03/15/2024  Reason for Consultation/Follow-up: Establishing goals of care  Subjective:  Awake this am, resting in bed, wife and son at bedside Having coughing spells, wife using bedside suction, long talk regarding figuring out appropriate disposition see below.  Length of Stay: 4  Current Medications: Scheduled Meds:   cefadroxil   500 mg Oral BID   donepezil   10 mg Oral QHS   enoxaparin  (LOVENOX ) injection  40 mg Subcutaneous Q24H   feeding supplement  237 mL Oral BID BM   mouth rinse  15 mL Mouth Rinse 4 times per day    Continuous Infusions:   PRN Meds: acetaminophen  **OR** acetaminophen , albuterol, lip balm, ondansetron  **OR** ondansetron  (ZOFRAN ) IV, mouth rinse, senna-docusate  Physical Exam         Awake alert no distress Appears with flat affect Elderly appearing gentleman Verbalizes some with wife Coarse cough.   Vital Signs: BP 113/66 (BP Location: Left Arm)   Pulse 72   Temp 98 F (36.7 C) (Oral)   Resp 20   Ht 6' (1.829 m)   Wt 58.1 kg   SpO2 92%   BMI 17.37 kg/m  SpO2: SpO2: 92 % O2 Device: O2 Device: Room Air O2 Flow Rate:    Intake/output summary:  Intake/Output Summary (Last 24 hours) at 03/19/2024 1321 Last data filed at 03/19/2024 1300 Gross per 24 hour  Intake 70 ml  Output 400 ml  Net -330 ml   LBM: Last BM Date : 03/18/24 Baseline Weight: Weight: 58.1 kg Most recent weight: Weight: 58.1 kg       Palliative Assessment/Data:      Patient Active Problem List   Diagnosis Date Noted   Palliative care by specialist  03/18/2024   Chronic right shoulder pain 10/20/2023   UTI (urinary tract infection) 10/18/2023   Chronic venous insufficiency 10/18/2023   PAD (peripheral artery disease) 10/18/2023   Compression fracture of thoracic vertebra (HCC) 01/07/2022   Low back pain 12/17/2021   Abnormal EKG 10/07/2021   Expressive aphasia 10/07/2021   Iron deficiency anemia due to chronic blood loss 10/07/2021  TIA (transient ischemic attack) 10/07/2021   Transient alteration of awareness 10/07/2021   Physical deconditioning 08/02/2021   GI bleed 08/01/2021   Anemia, GI Bleed 07/31/2021   B12 deficiency 07/31/2021   Elevated troponin 07/31/2021   Dysphagia 03/30/2021   Disequilibrium 03/30/2021   Goals of care, counseling/discussion 03/30/2021   Overflow incontinence of urine 03/30/2021   Diverticular hemorrhage    Rectal bleeding 01/23/2021   Painless rectal bleeding 01/22/2021   Dementia (HCC) 01/22/2021   Abnormal gait 10/12/2019   Palpitations 06/13/2019   SVT (supraventricular tachycardia)    Sinus bradycardia    Osteoarthritis of both knees    Macular degeneration    Allergy    Advanced dry age-related macular degeneration of both eyes with subfoveal involvement 03/03/2016   Hypertensive retinopathy of both eyes 03/03/2016   Pseudophakia of both eyes 03/03/2016   PVD (posterior vitreous detachment), both eyes 03/03/2016   Orthostasis 01/19/2016   Dilated aortic root 01/19/2016   PSVT (paroxysmal supraventricular tachycardia) 01/19/2016   Mild aortic insufficiency 01/19/2016   Sinus bradycardia seen on cardiac monitor 01/18/2016   Postnasal drip 11/05/2014   Age-related osteoporosis without current pathological fracture 11/13/2013   Incontinence of feces 11/01/2013   Tobacco user 11/01/2013   Nicotine dependence, unspecified, in remission 11/01/2013   Bradycardia 01/30/2013   Syncope and collapse 01/12/2013   BPH (benign prostatic hyperplasia) 01/12/2013   Personal history of transient  ischemic attack (TIA), and cerebral infarction without residual deficits 01/13/2012   History of stroke without residual deficits 01/13/2012   Osteoarthritis 05/21/2011   Lower urinary tract symptoms due to benign prostatic hyperplasia 05/21/2011    Palliative Care Assessment & Plan   Patient Profile:  89 y.o. male   admitted on 03/15/2024   89 y.o. male with medical history significant of dementia oriented only to self at baseline, BPH, hypertension, hyperlipidemia, osteoarthritis, bradycardia presented with altered mental status from SNF.   Assessment:  Patient with advanced dementia demonstrating progressive dysphagia, poor oral intake, weight loss, and overall functional decline consistent with late-stage disease trajectory. The patient is bedbound, minimally verbal, and dependent for all ADLs. Clinical picture suggests end-stage dementia with expected loss of swallowing function and increasing aspiration risk. Patient's wife is aware of the natural disease progression and that artificial nutrition or hydration would not improve quality of life or survival. Comfort feeding as tolerated has been recommended, with emphasis on mouth care, symptom control, and avoidance of burdensome interventions. Prognosis is poor, with likely limited life expectancy if current decline continues. Palliative care has been recommended at long term facility.   11-2: Long talk with patient's wife and son regarding the patient's current condition, discussed about prognosis, we talked about differences between long term care with palliative versus the type of care that is provided in a residential hospice facility. Answered their questions to the best of my ability, plan remains for long term care facility with palliative care to follow at this time, recommend TOC follow up in am.   Recommendations/Plan:   Long term care with hospice.   Code Status:    Code Status Orders  (From admission, onward)            Start     Ordered   03/15/24 1457  Do not attempt resuscitation (DNR)- Limited -Do Not Intubate (DNI)  Continuous       Question Answer Comment  If pulseless and not breathing No CPR or chest compressions.   In Pre-Arrest Conditions (Patient Is Breathing  and Has A Pulse) Do not intubate. Provide all appropriate non-invasive medical interventions. Avoid ICU transfer unless indicated or required.   Consent: Discussion documented in EHR or advanced directives reviewed      03/15/24 1459           Code Status History     Date Active Date Inactive Code Status Order ID Comments User Context   10/18/2023 2049 10/20/2023 1717 Limited: Do not attempt resuscitation (DNR) -DNR-LIMITED -Do Not Intubate/DNI  [id number]  Arthea Child, MD ED   07/31/2021 1351 08/08/2021 1818 DNR [id number]  Waddell Rake, MD ED   01/22/2021 1630 01/30/2021 2154 Full Code [id number]  Laurence Locus, DO Inpatient   01/22/2021 1611 01/22/2021 1630 Full Code [id number]  Laurence Locus, DO Inpatient   01/18/2016 2252 01/20/2016 1508 Full Code [id number]  Opyd, Evalene RAMAN, MD Inpatient       Prognosis:  < 12 months  Discharge Planning: LTC with hospice   Care plan was discussed with wife and son.   Thank you for allowing the Palliative Medicine Team to assist in the care of this patient. High MDM.      Greater than 50%  of this time was spent counseling and coordinating care related to the above assessment and plan.  Lonia Serve, MD  Please contact Palliative Medicine Team phone at [phone] for questions and concerns.

## 2024-03-19 NOTE — Progress Notes (Signed)
 Pt assisted with meal tonight. All aspiration interventions in place per ST/Orders. Mouth care given prior to intake. Pt assisted to eat 3 spoon fulls of chicken soup before stating no more. Afterwards, ate one spoon full of chocolate pudding. Pt exhibits s/sx of aspiration with latent cough/poor ability to clear. Family/Medical team aware of severe aspiration risk and continuing to give comfort feeding. Pt left upright in bed with no s/sx of distress. Plan of care ongoing.

## 2024-03-19 NOTE — Plan of Care (Signed)

## 2024-03-19 NOTE — Progress Notes (Signed)
 PROGRESS NOTE    Kevin Page  FMW:[id number] DOB: [birth year] DOA: 03/15/2024 PCP: Loreli Elsie [name], MD   Brief Narrative:  89 y.o. male with medical history significant of dementia oriented only to self at baseline, BPH, hypertension, hyperlipidemia, osteoarthritis, bradycardia presented with altered mental status from SNF.  On presentation, he was initially intermittently hypoxic but was afebrile.  Initial lactic acid was 2.7 but improved to 1.8 with IV fluids.  WBC of 8.4.  UA from 03/14/2024 was suggestive of UTI.  He was started on IV fluids and antibiotics.  CT head was negative for any acute intracranial abnormality.  Palliative care consulted as per family request.  Assessment & Plan:   UTI: Present on admission Severe sepsis: Present on admission Staph epidermidis bacteremia: Most likely contaminant - Presented with altered mental status possibly from UTI.  UA from 03/14/2024 showed possible UTI.  Patient was more confused on presentation with lactic acidosis, initial hypotension - Urine culture growing E. coli.  Blood culture grew staph epi in 1 bottle: Most likely contaminant.  Rocephin  has been switched to oral cefadroxil .  Off IV fluids.  Encourage oral intake.  Leukocytosis - Resolved   Acute metabolic encephalopathy Dementia Dysphagia Underweight -Presented with worsening confusion possibly from UTI in a patient with dementia.  Concern for seizure as well.  Fall precautions.  Seizure/delirium precautions.  CT head on presentation as well was negative for any intracranial abnormality.  No seizures since admission.  EEG discontinued on 03/16/2024 as per family request. - Family do not want to pursue PT/OT eval. -Diet as per SLP recommendations.  Patient is at severe aspiration risk --B12, folate, ammonia and TSH levels normal  Goals of care - Palliative care evaluation appreciated. - Oral intake getting overall worse: Son is wondering if patient would benefit from  moving to hospice directly.  I explained to him that if oral intake continues to remain poor: Patient might benefit from residential hospice.  I will wait for palliative care to weigh in on the same.   DVT prophylaxis: Lovenox  Code Status: DNR Family Communication: Son at bedside Disposition Plan: Status is: Inpatient Remains inpatient appropriate because: Of severity of illness.  Patient will need LTC placement with hospice versus residential hospice.  TOC following.  Currently medically stable for discharge.    Consultants: palliative care  Procedures: None  Antimicrobials:  Anti-infectives (From admission, onward)    Start     Dose/Rate Route Frequency Ordered Stop   03/18/24 1000  cefadroxil  (DURICEF) capsule 500 mg        500 mg Oral 2 times daily 03/17/24 1335 03/22/24 0959   03/16/24 1000  cefTRIAXone  (ROCEPHIN ) 2 g in sodium chloride  0.9 % 100 mL IVPB  Status:  Discontinued        2 g 200 mL/hr over 30 Minutes Intravenous Daily 03/16/24 0941 03/17/24 1335   03/15/24 1330  cefTRIAXone  (ROCEPHIN ) 2 g in sodium chloride  0.9 % 100 mL IVPB        2 g 200 mL/hr over 30 Minutes Intravenous Once 03/15/24 1318 03/15/24 1510        Subjective: Patient seen and examined at bedside.  Poor historian.  No agitation, seizures, fever or vomiting reported.   Objective: Vitals:   03/18/24 0526 03/18/24 1413 03/18/24 2020 03/19/24 0613  BP: 139/72 (!) 160/85 (!) 154/86 137/86  Pulse: 65 (!) 57 73 90  Resp: 18 14 (!) 24 (!) 24  Temp: 98 F (36.7 C) 98.3 F (36.8 C)  98.5 F (36.9 C) 99.1 F (37.3 C)  TempSrc:  Oral Oral   SpO2: 93% 94% 94% 92%  Weight:      Height:        Intake/Output Summary (Last 24 hours) at 03/19/2024 0808 Last data filed at 03/19/2024 0615 Gross per 24 hour  Intake --  Output 400 ml  Net -400 ml   Filed Weights   03/15/24 1811  Weight: 58.1 kg    Examination:  General: No acute distress.  Continues to remain on room air.  Elderly male in bed.   Extremely slow to respond.  Poor historian.  Remains confused.  Flat affect.  respiratory: Decreased breath sounds at bases bilaterally with some crackles CVS: S1-S2 heard; has intermittent bradycardia  abdominal: Soft, nontender, slightly distended; no organomegaly, bowel sounds heard  extremities: Trace lower extremity edema present; no cyanosis  Data Reviewed: I have personally reviewed following labs and imaging studies  CBC: Recent Labs  Lab 03/14/24 0847 03/15/24 1152 03/16/24 0534 03/17/24 0521  WBC 8.2 8.4 14.5* 7.7  NEUTROABS 6.3 7.1  --  5.5  HGB 13.5 10.2* 11.1* 11.9*  HCT 42.1 32.2* 34.6* 36.2*  MCV 94.2 97.9 93.5 95.5  PLT 298 206 280 251   Basic Metabolic Panel: Recent Labs  Lab 03/14/24 0847 03/15/24 1152 03/16/24 0534 03/17/24 0521  NA 136 137 137 139  K 3.8 4.1 4.0 3.8  CL 99 101 101 105  CO2 27 24 24 25   GLUCOSE 100* 95 93 84  BUN 8 10 10 11   CREATININE 0.83 0.87 0.76 0.65  CALCIUM 9.3 8.9 8.3* 8.1*  MG  --  2.1  --  1.8   GFR: Estimated Creatinine Clearance: 49.4 mL/min (by C-G formula based on SCr of 0.65 mg/dL). Liver Function Tests: Recent Labs  Lab 03/14/24 0847 03/15/24 1152 03/16/24 0534  AST 34 32 29  ALT 14 11 12   ALKPHOS 120 117 102  BILITOT 0.6 0.9 0.8  PROT 6.2* 5.7* 5.0*  ALBUMIN 3.6 3.2* 2.9*   No results for input(s): LIPASE, AMYLASE in the last 168 hours. Recent Labs  Lab 03/16/24 0534  AMMONIA 24   Coagulation Profile: No results for input(s): INR, PROTIME in the last 168 hours. Cardiac Enzymes: Recent Labs  Lab 03/14/24 0847 03/15/24 1152  CKTOTAL 202 123   BNP (last 3 results) No results for input(s): PROBNP in the last 8760 hours. HbA1C: No results for input(s): HGBA1C in the last 72 hours. CBG: Recent Labs  Lab 03/14/24 0852  GLUCAP 82   Lipid Profile: No results for input(s): CHOL, HDL, LDLCALC, TRIG, CHOLHDL, LDLDIRECT in the last 72 hours. Thyroid  Function Tests: No  results for input(s): TSH, T4TOTAL, FREET4, T3FREE, THYROIDAB in the last 72 hours.  Anemia Panel: No results for input(s): VITAMINB12, FOLATE, FERRITIN, TIBC, IRON, RETICCTPCT in the last 72 hours.  Sepsis Labs: Recent Labs  Lab 03/15/24 1159 03/15/24 1204 03/15/24 1330 03/15/24 1600  LATICACIDVEN 2.7* 2.7* 1.8 1.7    Recent Results (from the past 240 hours)  Urine Culture     Status: Abnormal   Collection Time: 03/14/24  1:16 PM   Specimen: Urine, Random  Result Value Ref Range Status   Specimen Description   Final    URINE, RANDOM Performed at [facility], [address] [place], [place] [address]    Special Requests   Final    NONE Reflexed from [id number] Performed at [facility], [address]  [place], [place] [address]    Culture (A)  Final    >=100,000 COLONIES/mL ESCHERICHIA COLI Two isolates with different morphologies were identified as the same organism.The most resistant organism was reported. Performed at [facility], [address], [place], [place] [address]    Report Status 03/17/2024 FINAL  Final   Organism ID, Bacteria ESCHERICHIA COLI (A)  Final      Susceptibility   Escherichia coli - MIC*    AMPICILLIN >=32 RESISTANT Resistant     CEFAZOLIN (URINE) Value in next row Sensitive      4 SENSITIVEThis is a modified FDA-approved test that has been validated and its performance characteristics determined by the reporting laboratory.  This laboratory is certified under the Clinical Laboratory Improvement Amendments CLIA as qualified to perform high complexity clinical laboratory testing.    CEFEPIME Value in next row Sensitive      4 SENSITIVEThis is a modified FDA-approved test that has been validated and its performance characteristics determined by the reporting laboratory.  This laboratory is certified under the Clinical Laboratory Improvement Amendments CLIA as qualified to perform high  complexity clinical laboratory testing.    ERTAPENEM Value in next row Sensitive      4 SENSITIVEThis is a modified FDA-approved test that has been validated and its performance characteristics determined by the reporting laboratory.  This laboratory is certified under the Clinical Laboratory Improvement Amendments CLIA as qualified to perform high complexity clinical laboratory testing.    CEFTRIAXONE  Value in next row Sensitive      4 SENSITIVEThis is a modified FDA-approved test that has been validated and its performance characteristics determined by the reporting laboratory.  This laboratory is certified under the Clinical Laboratory Improvement Amendments CLIA as qualified to perform high complexity clinical laboratory testing.    CIPROFLOXACIN Value in next row Intermediate      4 SENSITIVEThis is a modified FDA-approved test that has been validated and its performance characteristics determined by the reporting laboratory.  This laboratory is certified under the Clinical Laboratory Improvement Amendments CLIA as qualified to perform high complexity clinical laboratory testing.    GENTAMICIN Value in next row Sensitive      4 SENSITIVEThis is a modified FDA-approved test that has been validated and its performance characteristics determined by the reporting laboratory.  This laboratory is certified under the Clinical Laboratory Improvement Amendments CLIA as qualified to perform high complexity clinical laboratory testing.    NITROFURANTOIN Value in next row Sensitive      4 SENSITIVEThis is a modified FDA-approved test that has been validated and its performance characteristics determined by the reporting laboratory.  This laboratory is certified under the Clinical Laboratory Improvement Amendments CLIA as qualified to perform high complexity clinical laboratory testing.    TRIMETH/SULFA Value in next row Resistant      4 SENSITIVEThis is a modified FDA-approved test that has been validated and  its performance characteristics determined by the reporting laboratory.  This laboratory is certified under the Clinical Laboratory Improvement Amendments CLIA as qualified to perform high complexity clinical laboratory testing.    AMPICILLIN/SULBACTAM Value in next row Intermediate      4 SENSITIVEThis is a modified FDA-approved test that has been validated and its performance characteristics determined by the reporting laboratory.  This laboratory is certified under the Clinical Laboratory Improvement Amendments CLIA as qualified to perform high complexity clinical laboratory testing.    PIP/TAZO Value in next row Sensitive      <=4 SENSITIVEThis is  a modified FDA-approved test that has been validated and its performance characteristics determined by the reporting laboratory.  This laboratory is certified under the Clinical Laboratory Improvement Amendments CLIA as qualified to perform high complexity clinical laboratory testing.    MEROPENEM Value in next row Sensitive      <=4 SENSITIVEThis is a modified FDA-approved test that has been validated and its performance characteristics determined by the reporting laboratory.  This laboratory is certified under the Clinical Laboratory Improvement Amendments CLIA as qualified to perform high complexity clinical laboratory testing.    * >=100,000 COLONIES/mL ESCHERICHIA COLI  Urine Culture (for pregnant, neutropenic or urologic patients or patients with an indwelling urinary catheter)     Status: Abnormal   Collection Time: 03/14/24  1:16 PM   Specimen: Urine, Clean Catch  Result Value Ref Range Status   Specimen Description   Final    URINE, CLEAN CATCH Performed at [facility], [address], [place], [place] [address]    Special Requests   Final    NONE Performed at [facility], [address], [place], [place] [address]    Culture >=100,000 COLONIES/mL ESCHERICHIA COLI (A)  Final   Report Status  03/17/2024 FINAL  Final   Organism ID, Bacteria ESCHERICHIA COLI (A)  Final      Susceptibility   Escherichia coli - MIC*    AMPICILLIN >=32 RESISTANT Resistant     CEFAZOLIN (URINE) Value in next row Sensitive      4 SENSITIVEThis is a modified FDA-approved test that has been validated and its performance characteristics determined by the reporting laboratory.  This laboratory is certified under the Clinical Laboratory Improvement Amendments CLIA as qualified to perform high complexity clinical laboratory testing.    CEFEPIME Value in next row Sensitive      4 SENSITIVEThis is a modified FDA-approved test that has been validated and its performance characteristics determined by the reporting laboratory.  This laboratory is certified under the Clinical Laboratory Improvement Amendments CLIA as qualified to perform high complexity clinical laboratory testing.    ERTAPENEM Value in next row Sensitive      4 SENSITIVEThis is a modified FDA-approved test that has been validated and its performance characteristics determined by the reporting laboratory.  This laboratory is certified under the Clinical Laboratory Improvement Amendments CLIA as qualified to perform high complexity clinical laboratory testing.    CEFTRIAXONE  Value in next row Sensitive      4 SENSITIVEThis is a modified FDA-approved test that has been validated and its performance characteristics determined by the reporting laboratory.  This laboratory is certified under the Clinical Laboratory Improvement Amendments CLIA as qualified to perform high complexity clinical laboratory testing.    CIPROFLOXACIN Value in next row Sensitive      4 SENSITIVEThis is a modified FDA-approved test that has been validated and its performance characteristics determined by the reporting laboratory.  This laboratory is certified under the Clinical Laboratory Improvement Amendments CLIA as qualified to perform high complexity clinical laboratory testing.     GENTAMICIN Value in next row Sensitive      4 SENSITIVEThis is a modified FDA-approved test that has been validated and its performance characteristics determined by the reporting laboratory.  This laboratory is certified under the Clinical Laboratory Improvement Amendments CLIA as qualified to perform high complexity clinical laboratory testing.    NITROFURANTOIN Value in next row Sensitive      4 SENSITIVEThis is a modified FDA-approved test that has been  validated and its performance characteristics determined by the reporting laboratory.  This laboratory is certified under the Clinical Laboratory Improvement Amendments CLIA as qualified to perform high complexity clinical laboratory testing.    TRIMETH/SULFA Value in next row Resistant      4 SENSITIVEThis is a modified FDA-approved test that has been validated and its performance characteristics determined by the reporting laboratory.  This laboratory is certified under the Clinical Laboratory Improvement Amendments CLIA as qualified to perform high complexity clinical laboratory testing.    AMPICILLIN/SULBACTAM Value in next row Intermediate      4 SENSITIVEThis is a modified FDA-approved test that has been validated and its performance characteristics determined by the reporting laboratory.  This laboratory is certified under the Clinical Laboratory Improvement Amendments CLIA as qualified to perform high complexity clinical laboratory testing.    PIP/TAZO Value in next row Sensitive      <=4 SENSITIVEThis is a modified FDA-approved test that has been validated and its performance characteristics determined by the reporting laboratory.  This laboratory is certified under the Clinical Laboratory Improvement Amendments CLIA as qualified to perform high complexity clinical laboratory testing.    MEROPENEM Value in next row Sensitive      <=4 SENSITIVEThis is a modified FDA-approved test that has been validated and its performance characteristics  determined by the reporting laboratory.  This laboratory is certified under the Clinical Laboratory Improvement Amendments CLIA as qualified to perform high complexity clinical laboratory testing.    * >=100,000 COLONIES/mL ESCHERICHIA COLI  Culture, blood (single)     Status: Abnormal   Collection Time: 03/15/24  1:23 PM   Specimen: BLOOD  Result Value Ref Range Status   Specimen Description   Final    BLOOD A-LINE Performed at [facility], [address], [place], [place] [address]    Special Requests   Final    BOTTLES DRAWN AEROBIC AND ANAEROBIC Blood Culture results may not be optimal due to an inadequate volume of blood received in culture bottles Performed at [facility], [address], [place], [place] [address]    Culture  Setup Time   Final    GRAM POSITIVE COCCI IN CLUSTERS ANAEROBIC BOTTLE ONLY CRITICAL RESULT CALLED TO, READ BACK BY AND VERIFIED WITH: PHARMD E JACKSON 03/16/2024 @ 2225 BY AB    Culture (A)  Final    STAPHYLOCOCCUS EPIDERMIDIS THE SIGNIFICANCE OF ISOLATING THIS ORGANISM FROM A SINGLE SET OF BLOOD CULTURES WHEN MULTIPLE SETS ARE DRAWN IS UNCERTAIN. PLEASE NOTIFY THE MICROBIOLOGY DEPARTMENT WITHIN ONE WEEK IF SPECIATION AND SENSITIVITIES ARE REQUIRED. Performed at [facility], [address] [place], [place] [address]    Report Status 03/18/2024 FINAL  Final  Blood Culture ID Panel (Reflexed)     Status: Abnormal   Collection Time: 03/15/24  1:23 PM  Result Value Ref Range Status   Enterococcus faecalis NOT DETECTED NOT DETECTED Final   Enterococcus Faecium NOT DETECTED NOT DETECTED Final   Listeria monocytogenes NOT DETECTED NOT DETECTED Final   Staphylococcus species DETECTED (A) NOT DETECTED Final    Comment: CRITICAL RESULT CALLED TO, READ BACK BY AND VERIFIED WITH: PHARMD E JACKSON 03/16/2024 @ 2225 BY AB    Staphylococcus aureus (BCID) NOT DETECTED NOT DETECTED Final   Staphylococcus epidermidis  DETECTED (A) NOT DETECTED Final    Comment: CRITICAL RESULT CALLED TO, READ BACK BY AND VERIFIED WITH: PHARMD E JACKSON 03/16/2024 @ 2225 BY AB    Staphylococcus lugdunensis NOT  DETECTED NOT DETECTED Final   Streptococcus species NOT DETECTED NOT DETECTED Final   Streptococcus agalactiae NOT DETECTED NOT DETECTED Final   Streptococcus pneumoniae NOT DETECTED NOT DETECTED Final   Streptococcus pyogenes NOT DETECTED NOT DETECTED Final   A.calcoaceticus-baumannii NOT DETECTED NOT DETECTED Final   Bacteroides fragilis NOT DETECTED NOT DETECTED Final   Enterobacterales NOT DETECTED NOT DETECTED Final   Enterobacter cloacae complex NOT DETECTED NOT DETECTED Final   Escherichia coli NOT DETECTED NOT DETECTED Final   Klebsiella aerogenes NOT DETECTED NOT DETECTED Final   Klebsiella oxytoca NOT DETECTED NOT DETECTED Final   Klebsiella pneumoniae NOT DETECTED NOT DETECTED Final   Proteus species NOT DETECTED NOT DETECTED Final   Salmonella species NOT DETECTED NOT DETECTED Final   Serratia marcescens NOT DETECTED NOT DETECTED Final   Haemophilus influenzae NOT DETECTED NOT DETECTED Final   Neisseria meningitidis NOT DETECTED NOT DETECTED Final   Pseudomonas aeruginosa NOT DETECTED NOT DETECTED Final   Stenotrophomonas maltophilia NOT DETECTED NOT DETECTED Final   Candida albicans NOT DETECTED NOT DETECTED Final   Candida auris NOT DETECTED NOT DETECTED Final   Candida glabrata NOT DETECTED NOT DETECTED Final   Candida krusei NOT DETECTED NOT DETECTED Final   Candida parapsilosis NOT DETECTED NOT DETECTED Final   Candida tropicalis NOT DETECTED NOT DETECTED Final   Cryptococcus neoformans/gattii NOT DETECTED NOT DETECTED Final   Methicillin resistance mecA/C NOT DETECTED NOT DETECTED Final    Comment: Performed at [facility], [address], [place], [place] [address]  Blood culture (routine x 2)     Status: None (Preliminary result)   Collection Time: 03/15/24  7:35 PM   Specimen:  BLOOD  Result Value Ref Range Status   Specimen Description   Final    BLOOD BLOOD LEFT ARM Performed at [facility], [address], [place], [place] [address]    Special Requests   Final    Blood Culture adequate volume BOTTLES DRAWN AEROBIC AND ANAEROBIC Performed at [facility], [address] [place], [place] [address]    Culture   Final    NO GROWTH 3 DAYS Performed at [facility], [address] [place], [place] [address]    Report Status PENDING  Incomplete  Blood culture (routine x 2)     Status: None (Preliminary result)   Collection Time: 03/15/24  7:40 PM   Specimen: BLOOD  Result Value Ref Range Status   Specimen Description BLOOD SITE NOT SPECIFIED  Final   Special Requests   Final    BOTTLES DRAWN AEROBIC ONLY Blood Culture adequate volume   Culture   Final    NO GROWTH 3 DAYS Performed at [facility], [address], [place], [place] [address]    Report Status PENDING  Incomplete         Radiology Studies: No results found.       Scheduled Meds:  cefadroxil   500 mg Oral BID   donepezil   10 mg Oral QHS   enoxaparin  (LOVENOX ) injection  40 mg Subcutaneous Q24H   feeding supplement  237 mL Oral BID BM   mouth rinse  15 mL Mouth Rinse 4 times per day   Continuous Infusions:          Sophie Mao, MD [facility] 03/19/2024, 8:08 AM

## 2024-03-20 DIAGNOSIS — Z515 Encounter for palliative care: Secondary | ICD-10-CM | POA: Diagnosis not present

## 2024-03-20 DIAGNOSIS — R4182 Altered mental status, unspecified: Secondary | ICD-10-CM

## 2024-03-20 DIAGNOSIS — N3000 Acute cystitis without hematuria: Secondary | ICD-10-CM | POA: Diagnosis not present

## 2024-03-20 DIAGNOSIS — Z7189 Other specified counseling: Secondary | ICD-10-CM | POA: Diagnosis not present

## 2024-03-20 LAB — CULTURE, BLOOD (ROUTINE X 2)
Culture: NO GROWTH
Culture: NO GROWTH
Special Requests: ADEQUATE
Special Requests: ADEQUATE

## 2024-03-20 NOTE — TOC Progression Note (Signed)
 Transition of Care [facility]) - Progression Note    Patient Details  Name: Kevin Page MRN: [id number] Date of Birth: 04-Nov-1930  [facility] CM/SW Contact  Abiel Antrim, Nathanel, RN Phone Number: 03/20/2024, 10:07 AM  Clinical Narrative:   I spoke to Sally(spouse) I am trying to explain the differences between ST SNF/SNF/LTC. Noted is LTC w/palliative care services-please say these terms. U can refer to IP CM to explain to spouse. Current.y no LTC bed offers-informed spouse she can contact a LTC facility & see if they can accept-await outcome.    Expected Discharge Plan: Long Term Nursing Home Barriers to Discharge: Continued Medical Work up               Expected Discharge Plan and Services   Discharge Planning Services: CM Consult Post Acute Care Choice: Nursing Home Living arrangements for the past 2 months: Assisted Living Facility (memory care)                 DME Arranged: N/A DME Agency: NA       HH Arranged: NA HH Agency: NA         Social Drivers of Health (SDOH) Interventions SDOH Screenings   Food Insecurity: No Food Insecurity (03/16/2024)  Housing: Low Risk  (03/16/2024)  Transportation Needs: No Transportation Needs (03/16/2024)  Utilities: Not At Risk (03/16/2024)  Tobacco Use: Medium Risk (03/19/2024)    Readmission Risk Interventions    03/16/2024    9:51 AM 10/19/2023    2:57 PM  Readmission Risk Prevention Plan  Post Dischage Appt  Complete  Medication Screening  Complete  Transportation Screening Complete Complete  PCP or Specialist Appt within 5-7 Days Complete   Home Care Screening Complete   Medication Review (RN CM) Complete

## 2024-03-20 NOTE — Progress Notes (Signed)
 PROGRESS NOTE    Kevin Page  FMW:[id number] DOB: [birth year] DOA: 03/15/2024 PCP: Loreli Elsie Kevin [name], MD   Brief Narrative:  89 y.o. male with medical history significant of dementia oriented only to self at baseline, BPH, hypertension, hyperlipidemia, osteoarthritis, bradycardia presented with altered mental status from SNF.  On presentation, he was initially intermittently hypoxic but was afebrile.  Initial lactic acid was 2.7 but improved to 1.8 with IV fluids.  WBC of 8.4.  UA from 03/14/2024 was suggestive of UTI.  He was started on IV fluids and antibiotics.  CT head was negative for any acute intracranial abnormality.  Palliative care consulted as per family request.  Assessment & Plan:   UTI: Present on admission Severe sepsis: Present on admission Staph epidermidis bacteremia: Most likely contaminant - Presented with altered mental status possibly from UTI.  UA from 03/14/2024 showed possible UTI.  Patient was more confused on presentation with lactic acidosis, initial hypotension - Urine culture growing E. coli.  Blood culture grew staph epi in 1 bottle: Most likely contaminant.  Rocephin  has been switched to oral cefadroxil : Finish total 7-day course of therapy.  Off IV fluids.  Encourage oral intake.  Leukocytosis - Resolved   Acute metabolic encephalopathy Dementia Dysphagia Underweight -Presented with worsening confusion possibly from UTI in a patient with dementia.  Concern for seizure as well.  Fall precautions.  Seizure/delirium precautions.  CT head on presentation as well was negative for any intracranial abnormality.  No seizures since admission.  EEG discontinued on 03/16/2024 as per family request. - Family do not want to pursue PT/OT eval. -Diet as per SLP recommendations.  Patient is at severe aspiration risk --B12, folate, ammonia and TSH levels normal  Goals of care - Palliative care evaluation appreciated.  Recommending long-term care with hospice - Oral  intake getting overall worse  DVT prophylaxis: Lovenox  Code Status: DNR Family Communication: wife at bedside Disposition Plan: Status is: Inpatient Remains inpatient appropriate because: Of severity of illness.  Patient will need LTC placement with hospice.  TOC following.  Currently medically stable for discharge.    Consultants: palliative care  Procedures: None  Antimicrobials:  Anti-infectives (From admission, onward)    Start     Dose/Rate Route Frequency Ordered Stop   03/18/24 1000  cefadroxil  (DURICEF) capsule 500 mg        500 mg Oral 2 times daily 03/17/24 1335 03/22/24 0959   03/16/24 1000  cefTRIAXone  (ROCEPHIN ) 2 g in sodium chloride  0.9 % 100 mL IVPB  Status:  Discontinued        2 g 200 mL/hr over 30 Minutes Intravenous Daily 03/16/24 0941 03/17/24 1335   03/15/24 1330  cefTRIAXone  (ROCEPHIN ) 2 g in sodium chloride  0.9 % 100 mL IVPB        2 g 200 mL/hr over 30 Minutes Intravenous Once 03/15/24 1318 03/15/24 1510        Subjective: Patient seen and examined at bedside.  Poor historian.  No fever, seizures or agitation reported.  Oral intake remains poor.   Objective: Vitals:   03/19/24 0613 03/19/24 1313 03/19/24 2133 03/20/24 0515  BP: 137/86 113/66 135/77 (!) 154/101  Pulse: 90 72 77 65  Resp: (!) 24 20 (!) 22 18  Temp: 99.1 F (37.3 C) 98 F (36.7 C) 98.2 F (36.8 C) 97.9 F (36.6 C)  TempSrc:  Oral Oral Oral  SpO2: 92% 92% (!) 89% 90%  Weight:      Height:  Intake/Output Summary (Last 24 hours) at 03/20/2024 0758 Last data filed at 03/19/2024 2000 Gross per 24 hour  Intake 120 ml  Output --  Net 120 ml   Filed Weights   03/15/24 1811  Weight: 58.1 kg    Examination:  General: On room air currently.  No distress.  Elderly male in bed.  Extremely slow to respond.  Poor historian.  Remains confused.  Flat affect.  respiratory: Bilateral decreased breath sounds at bases with intermittent tachypnea  CVS: Rate mostly controlled  currently; S1 and S2 are heard abdominal: Soft, nontender, mildly distended; no organomegaly, bowel sounds are normally heard heard  extremities: No clubbing; mild lower extremity edema present Data Reviewed: I have personally reviewed following labs and imaging studies  CBC: Recent Labs  Lab 03/14/24 0847 03/15/24 1152 03/16/24 0534 03/17/24 0521  WBC 8.2 8.4 14.5* 7.7  NEUTROABS 6.3 7.1  --  5.5  HGB 13.5 10.2* 11.1* 11.9*  HCT 42.1 32.2* 34.6* 36.2*  MCV 94.2 97.9 93.5 95.5  PLT 298 206 280 251   Basic Metabolic Panel: Recent Labs  Lab 03/14/24 0847 03/15/24 1152 03/16/24 0534 03/17/24 0521  NA 136 137 137 139  K 3.8 4.1 4.0 3.8  CL 99 101 101 105  CO2 27 24 24 25   GLUCOSE 100* 95 93 84  BUN 8 10 10 11   CREATININE 0.83 0.87 0.76 0.65  CALCIUM 9.3 8.9 8.3* 8.1*  MG  --  2.1  --  1.8   GFR: Estimated Creatinine Clearance: 49.4 mL/min (by C-G formula based on SCr of 0.65 mg/dL). Liver Function Tests: Recent Labs  Lab 03/14/24 0847 03/15/24 1152 03/16/24 0534  AST 34 32 29  ALT 14 11 12   ALKPHOS 120 117 102  BILITOT 0.6 0.9 0.8  PROT 6.2* 5.7* 5.0*  ALBUMIN 3.6 3.2* 2.9*   No results for input(s): LIPASE, AMYLASE in the last 168 hours. Recent Labs  Lab 03/16/24 0534  AMMONIA 24   Coagulation Profile: No results for input(s): INR, PROTIME in the last 168 hours. Cardiac Enzymes: Recent Labs  Lab 03/14/24 0847 03/15/24 1152  CKTOTAL 202 123   BNP (last 3 results) No results for input(s): PROBNP in the last 8760 hours. HbA1C: No results for input(s): HGBA1C in the last 72 hours. CBG: Recent Labs  Lab 03/14/24 0852  GLUCAP 82   Lipid Profile: No results for input(s): CHOL, HDL, LDLCALC, TRIG, CHOLHDL, LDLDIRECT in the last 72 hours. Thyroid  Function Tests: No results for input(s): TSH, T4TOTAL, FREET4, T3FREE, THYROIDAB in the last 72 hours.  Anemia Panel: No results for input(s): VITAMINB12, FOLATE,  FERRITIN, TIBC, IRON, RETICCTPCT in the last 72 hours.  Sepsis Labs: Recent Labs  Lab 03/15/24 1159 03/15/24 1204 03/15/24 1330 03/15/24 1600  LATICACIDVEN 2.7* 2.7* 1.8 1.7    Recent Results (from the past 240 hours)  Urine Culture     Status: Abnormal   Collection Time: 03/14/24  1:16 PM   Specimen: Urine, Random  Result Value Ref Range Status   Specimen Description   Final    URINE, RANDOM Performed at [facility], [address] [place], [place] [address]    Special Requests   Final    NONE Reflexed from [id number] Performed at [facility], [address], [place], [place] [address]    Culture (A)  Final    >=100,000 COLONIES/mL ESCHERICHIA COLI Two isolates with different morphologies were identified as the same organism.The most resistant organism was  reported. Performed at [facility], [address] [place], [place] [address]    Report Status 03/17/2024 FINAL  Final   Organism ID, Bacteria ESCHERICHIA COLI (A)  Final      Susceptibility   Escherichia coli - MIC*    AMPICILLIN >=32 RESISTANT Resistant     CEFAZOLIN (URINE) Value in next row Sensitive      4 SENSITIVEThis is a modified FDA-approved test that has been validated and its performance characteristics determined by the reporting laboratory.  This laboratory is certified under the Clinical Laboratory Improvement Amendments CLIA as qualified to perform high complexity clinical laboratory testing.    CEFEPIME Value in next row Sensitive      4 SENSITIVEThis is a modified FDA-approved test that has been validated and its performance characteristics determined by the reporting laboratory.  This laboratory is certified under the Clinical Laboratory Improvement Amendments CLIA as qualified to perform high complexity clinical laboratory testing.    ERTAPENEM Value in next row Sensitive      4 SENSITIVEThis is a modified FDA-approved test that has been validated and  its performance characteristics determined by the reporting laboratory.  This laboratory is certified under the Clinical Laboratory Improvement Amendments CLIA as qualified to perform high complexity clinical laboratory testing.    CEFTRIAXONE  Value in next row Sensitive      4 SENSITIVEThis is a modified FDA-approved test that has been validated and its performance characteristics determined by the reporting laboratory.  This laboratory is certified under the Clinical Laboratory Improvement Amendments CLIA as qualified to perform high complexity clinical laboratory testing.    CIPROFLOXACIN Value in next row Intermediate      4 SENSITIVEThis is a modified FDA-approved test that has been validated and its performance characteristics determined by the reporting laboratory.  This laboratory is certified under the Clinical Laboratory Improvement Amendments CLIA as qualified to perform high complexity clinical laboratory testing.    GENTAMICIN Value in next row Sensitive      4 SENSITIVEThis is a modified FDA-approved test that has been validated and its performance characteristics determined by the reporting laboratory.  This laboratory is certified under the Clinical Laboratory Improvement Amendments CLIA as qualified to perform high complexity clinical laboratory testing.    NITROFURANTOIN Value in next row Sensitive      4 SENSITIVEThis is a modified FDA-approved test that has been validated and its performance characteristics determined by the reporting laboratory.  This laboratory is certified under the Clinical Laboratory Improvement Amendments CLIA as qualified to perform high complexity clinical laboratory testing.    TRIMETH/SULFA Value in next row Resistant      4 SENSITIVEThis is a modified FDA-approved test that has been validated and its performance characteristics determined by the reporting laboratory.  This laboratory is certified under the Clinical Laboratory Improvement Amendments CLIA as  qualified to perform high complexity clinical laboratory testing.    AMPICILLIN/SULBACTAM Value in next row Intermediate      4 SENSITIVEThis is a modified FDA-approved test that has been validated and its performance characteristics determined by the reporting laboratory.  This laboratory is certified under the Clinical Laboratory Improvement Amendments CLIA as qualified to perform high complexity clinical laboratory testing.    PIP/TAZO Value in next row Sensitive      <=4 SENSITIVEThis is a modified FDA-approved test that has been validated and its performance characteristics determined by the reporting laboratory.  This laboratory is certified under the Clinical Laboratory Improvement Amendments CLIA as qualified to  perform high complexity clinical laboratory testing.    MEROPENEM Value in next row Sensitive      <=4 SENSITIVEThis is a modified FDA-approved test that has been validated and its performance characteristics determined by the reporting laboratory.  This laboratory is certified under the Clinical Laboratory Improvement Amendments CLIA as qualified to perform high complexity clinical laboratory testing.    * >=100,000 COLONIES/mL ESCHERICHIA COLI  Urine Culture (for pregnant, neutropenic or urologic patients or patients with an indwelling urinary catheter)     Status: Abnormal   Collection Time: 03/14/24  1:16 PM   Specimen: Urine, Clean Catch  Result Value Ref Range Status   Specimen Description   Final    URINE, CLEAN CATCH Performed at [facility], [address] [place], [place] [address]    Special Requests   Final    NONE Performed at [facility], [address] [place], [place] [address]    Culture >=100,000 COLONIES/mL ESCHERICHIA COLI (A)  Final   Report Status 03/17/2024 FINAL  Final   Organism ID, Bacteria ESCHERICHIA COLI (A)  Final      Susceptibility   Escherichia coli - MIC*    AMPICILLIN >=32 RESISTANT Resistant      CEFAZOLIN (URINE) Value in next row Sensitive      4 SENSITIVEThis is a modified FDA-approved test that has been validated and its performance characteristics determined by the reporting laboratory.  This laboratory is certified under the Clinical Laboratory Improvement Amendments CLIA as qualified to perform high complexity clinical laboratory testing.    CEFEPIME Value in next row Sensitive      4 SENSITIVEThis is a modified FDA-approved test that has been validated and its performance characteristics determined by the reporting laboratory.  This laboratory is certified under the Clinical Laboratory Improvement Amendments CLIA as qualified to perform high complexity clinical laboratory testing.    ERTAPENEM Value in next row Sensitive      4 SENSITIVEThis is a modified FDA-approved test that has been validated and its performance characteristics determined by the reporting laboratory.  This laboratory is certified under the Clinical Laboratory Improvement Amendments CLIA as qualified to perform high complexity clinical laboratory testing.    CEFTRIAXONE  Value in next row Sensitive      4 SENSITIVEThis is a modified FDA-approved test that has been validated and its performance characteristics determined by the reporting laboratory.  This laboratory is certified under the Clinical Laboratory Improvement Amendments CLIA as qualified to perform high complexity clinical laboratory testing.    CIPROFLOXACIN Value in next row Sensitive      4 SENSITIVEThis is a modified FDA-approved test that has been validated and its performance characteristics determined by the reporting laboratory.  This laboratory is certified under the Clinical Laboratory Improvement Amendments CLIA as qualified to perform high complexity clinical laboratory testing.    GENTAMICIN Value in next row Sensitive      4 SENSITIVEThis is a modified FDA-approved test that has been validated and its performance characteristics determined by the  reporting laboratory.  This laboratory is certified under the Clinical Laboratory Improvement Amendments CLIA as qualified to perform high complexity clinical laboratory testing.    NITROFURANTOIN Value in next row Sensitive      4 SENSITIVEThis is a modified FDA-approved test that has been validated and its performance characteristics determined by the reporting laboratory.  This laboratory is certified under the Clinical Laboratory Improvement Amendments CLIA as qualified to perform high complexity clinical laboratory testing.  TRIMETH/SULFA Value in next row Resistant      4 SENSITIVEThis is a modified FDA-approved test that has been validated and its performance characteristics determined by the reporting laboratory.  This laboratory is certified under the Clinical Laboratory Improvement Amendments CLIA as qualified to perform high complexity clinical laboratory testing.    AMPICILLIN/SULBACTAM Value in next row Intermediate      4 SENSITIVEThis is a modified FDA-approved test that has been validated and its performance characteristics determined by the reporting laboratory.  This laboratory is certified under the Clinical Laboratory Improvement Amendments CLIA as qualified to perform high complexity clinical laboratory testing.    PIP/TAZO Value in next row Sensitive      <=4 SENSITIVEThis is a modified FDA-approved test that has been validated and its performance characteristics determined by the reporting laboratory.  This laboratory is certified under the Clinical Laboratory Improvement Amendments CLIA as qualified to perform high complexity clinical laboratory testing.    MEROPENEM Value in next row Sensitive      <=4 SENSITIVEThis is a modified FDA-approved test that has been validated and its performance characteristics determined by the reporting laboratory.  This laboratory is certified under the Clinical Laboratory Improvement Amendments CLIA as qualified to perform high complexity clinical  laboratory testing.    * >=100,000 COLONIES/mL ESCHERICHIA COLI  Culture, blood (single)     Status: Abnormal   Collection Time: 03/15/24  1:23 PM   Specimen: BLOOD  Result Value Ref Range Status   Specimen Description   Final    BLOOD A-LINE Performed at [facility], [address], [place], [place] [address]    Special Requests   Final    BOTTLES DRAWN AEROBIC AND ANAEROBIC Blood Culture results may not be optimal due to an inadequate volume of blood received in culture bottles Performed at [facility], [address], [place], [place] [address]    Culture  Setup Time   Final    GRAM POSITIVE COCCI IN CLUSTERS ANAEROBIC BOTTLE ONLY CRITICAL RESULT CALLED TO, READ BACK BY AND VERIFIED WITH: PHARMD E JACKSON 03/16/2024 @ 2225 BY AB    Culture (A)  Final    STAPHYLOCOCCUS EPIDERMIDIS THE SIGNIFICANCE OF ISOLATING THIS ORGANISM FROM A SINGLE SET OF BLOOD CULTURES WHEN MULTIPLE SETS ARE DRAWN IS UNCERTAIN. PLEASE NOTIFY THE MICROBIOLOGY DEPARTMENT WITHIN ONE WEEK IF SPECIATION AND SENSITIVITIES ARE REQUIRED. Performed at [facility], [address] [place], [place] [address]    Report Status 03/18/2024 FINAL  Final  Blood Culture ID Panel (Reflexed)     Status: Abnormal   Collection Time: 03/15/24  1:23 PM  Result Value Ref Range Status   Enterococcus faecalis NOT DETECTED NOT DETECTED Final   Enterococcus Faecium NOT DETECTED NOT DETECTED Final   Listeria monocytogenes NOT DETECTED NOT DETECTED Final   Staphylococcus species DETECTED (A) NOT DETECTED Final    Comment: CRITICAL RESULT CALLED TO, READ BACK BY AND VERIFIED WITH: PHARMD E JACKSON 03/16/2024 @ 2225 BY AB    Staphylococcus aureus (BCID) NOT DETECTED NOT DETECTED Final   Staphylococcus epidermidis DETECTED (A) NOT DETECTED Final    Comment: CRITICAL RESULT CALLED TO, READ BACK BY AND VERIFIED WITH: PHARMD E JACKSON 03/16/2024 @ 2225 BY AB    Staphylococcus lugdunensis  NOT DETECTED NOT DETECTED Final   Streptococcus species NOT DETECTED NOT DETECTED Final   Streptococcus agalactiae NOT DETECTED NOT DETECTED Final   Streptococcus pneumoniae NOT DETECTED NOT DETECTED Final   Streptococcus  pyogenes NOT DETECTED NOT DETECTED Final   A.calcoaceticus-baumannii NOT DETECTED NOT DETECTED Final   Bacteroides fragilis NOT DETECTED NOT DETECTED Final   Enterobacterales NOT DETECTED NOT DETECTED Final   Enterobacter cloacae complex NOT DETECTED NOT DETECTED Final   Escherichia coli NOT DETECTED NOT DETECTED Final   Klebsiella aerogenes NOT DETECTED NOT DETECTED Final   Klebsiella oxytoca NOT DETECTED NOT DETECTED Final   Klebsiella pneumoniae NOT DETECTED NOT DETECTED Final   Proteus species NOT DETECTED NOT DETECTED Final   Salmonella species NOT DETECTED NOT DETECTED Final   Serratia marcescens NOT DETECTED NOT DETECTED Final   Haemophilus influenzae NOT DETECTED NOT DETECTED Final   Neisseria meningitidis NOT DETECTED NOT DETECTED Final   Pseudomonas aeruginosa NOT DETECTED NOT DETECTED Final   Stenotrophomonas maltophilia NOT DETECTED NOT DETECTED Final   Candida albicans NOT DETECTED NOT DETECTED Final   Candida auris NOT DETECTED NOT DETECTED Final   Candida glabrata NOT DETECTED NOT DETECTED Final   Candida krusei NOT DETECTED NOT DETECTED Final   Candida parapsilosis NOT DETECTED NOT DETECTED Final   Candida tropicalis NOT DETECTED NOT DETECTED Final   Cryptococcus neoformans/gattii NOT DETECTED NOT DETECTED Final   Methicillin resistance mecA/C NOT DETECTED NOT DETECTED Final    Comment: Performed at [facility], [address] [place], [place] [address]  Blood culture (routine x 2)     Status: None (Preliminary result)   Collection Time: 03/15/24  7:35 PM   Specimen: BLOOD  Result Value Ref Range Status   Specimen Description   Final    BLOOD BLOOD LEFT ARM Performed at [facility], [address] [place], [place]  [address]    Special Requests   Final    Blood Culture adequate volume BOTTLES DRAWN AEROBIC AND ANAEROBIC Performed at [facility], [address] [place], [place] [address]    Culture   Final    NO GROWTH 4 DAYS Performed at [facility], [address], [place], [place] [address]    Report Status PENDING  Incomplete  Blood culture (routine x 2)     Status: None (Preliminary result)   Collection Time: 03/15/24  7:40 PM   Specimen: BLOOD  Result Value Ref Range Status   Specimen Description BLOOD SITE NOT SPECIFIED  Final   Special Requests   Final    BOTTLES DRAWN AEROBIC ONLY Blood Culture adequate volume   Culture   Final    NO GROWTH 4 DAYS Performed at [facility], [address], [place], [place] [address]    Report Status PENDING  Incomplete         Radiology Studies: No results found.       Scheduled Meds:  cefadroxil   500 mg Oral BID   donepezil   10 mg Oral QHS   enoxaparin  (LOVENOX ) injection  40 mg Subcutaneous Q24H   feeding supplement  237 mL Oral BID BM   mouth rinse  15 mL Mouth Rinse 4 times per day   Continuous Infusions:          Sophie Mao, MD [facility] 03/20/2024, 7:58 AM

## 2024-03-20 NOTE — Plan of Care (Signed)
  Problem: Clinical Measurements: Goal: Will remain free from infection Outcome: Progressing   Problem: Pain Managment: Goal: General experience of comfort will improve and/or be controlled Outcome: Progressing   Problem: Education: Goal: Knowledge of General Education information will improve Description: Including pain rating scale, medication(s)/side effects and non-pharmacologic comfort measures Outcome: Not Progressing   Problem: Health Behavior/Discharge Planning: Goal: Ability to manage health-related needs will improve Outcome: Not Progressing   Problem: Clinical Measurements: Goal: Ability to maintain clinical measurements within normal limits will improve Outcome: Not Progressing   Problem: Activity: Goal: Risk for activity intolerance will decrease Outcome: Not Progressing   Problem: Nutrition: Goal: Adequate nutrition will be maintained Outcome: Not Progressing

## 2024-03-20 NOTE — Plan of Care (Signed)
  Problem: Education: Goal: Knowledge of General Education information will improve Description: Including pain rating scale, medication(s)/side effects and non-pharmacologic comfort measures Outcome: Not Progressing   Problem: Health Behavior/Discharge Planning: Goal: Ability to manage health-related needs will improve Outcome: Not Progressing   Problem: Activity: Goal: Risk for activity intolerance will decrease Outcome: Not Progressing   Problem: Nutrition: Goal: Adequate nutrition will be maintained Outcome: Not Progressing   Problem: Coping: Goal: Level of anxiety will decrease Outcome: Progressing

## 2024-03-20 NOTE — Progress Notes (Signed)
 Daily Progress Note   Patient Name: Kevin Page       Date: 03/20/2024 DOB: 11-09-1930  Age: 89 y.o. MRN#: [id number] Attending Physician: Cheryle Page, MD Primary Care Physician: Loreli Elsie [name], MD Admit Date: 03/15/2024  Reason for Consultation/Follow-up: Establishing goals of care  Subjective:  Awake this am, resting in bed, wife at bedside Discussed about current PO intake and disposition options. Also discussed with [name] from elder law care as well as with TOC.  Length of Stay: 5  Current Medications: Scheduled Meds:   cefadroxil   500 mg Oral BID   donepezil   10 mg Oral QHS   enoxaparin  (LOVENOX ) injection  40 mg Subcutaneous Q24H   feeding supplement  237 mL Oral BID BM   mouth rinse  15 mL Mouth Rinse 4 times per day    Continuous Infusions:   PRN Meds: acetaminophen  **OR** acetaminophen , albuterol, lip balm, ondansetron  **OR** ondansetron  (ZOFRAN ) IV, mouth rinse, senna-docusate  Physical Exam         Awake alert no distress Appears with flat affect Elderly appearing gentleman Verbalizes some with wife Coarse cough.   Vital Signs: BP (!) 159/77 (BP Location: Left Arm)   Pulse 72   Temp 97.9 F (36.6 C) (Oral)   Resp 19   Ht 6' (1.829 m)   Wt 58.1 kg   SpO2 90%   BMI 17.37 kg/m  SpO2: SpO2: 90 % O2 Device: O2 Device: Room Air O2 Flow Rate:    Intake/output summary:  Intake/Output Summary (Last 24 hours) at 03/20/2024 1446 Last data filed at 03/20/2024 1159 Gross per 24 hour  Intake 210 ml  Output 300 ml  Net -90 ml   LBM: Last BM Date : 03/19/24 Baseline Weight: Weight: 58.1 kg Most recent weight: Weight: 58.1 kg       Palliative Assessment/Data:      Patient Active Problem List   Diagnosis Date Noted   General weakness 03/19/2024    Palliative care by specialist 03/18/2024   Chronic right shoulder pain 10/20/2023   UTI (urinary tract infection) 10/18/2023   Chronic venous insufficiency 10/18/2023   PAD (peripheral artery disease) 10/18/2023   Compression fracture of thoracic vertebra (HCC) 01/07/2022   Low back pain 12/17/2021   Abnormal EKG 10/07/2021   Expressive aphasia 10/07/2021   Iron deficiency  anemia due to chronic blood loss 10/07/2021   TIA (transient ischemic attack) 10/07/2021   Transient alteration of awareness 10/07/2021   Physical deconditioning 08/02/2021   GI bleed 08/01/2021   Anemia, GI Bleed 07/31/2021   B12 deficiency 07/31/2021   Elevated troponin 07/31/2021   Dysphagia 03/30/2021   Disequilibrium 03/30/2021   Goals of care, counseling/discussion 03/30/2021   Overflow incontinence of urine 03/30/2021   Diverticular hemorrhage    Rectal bleeding 01/23/2021   Painless rectal bleeding 01/22/2021   Dementia (HCC) 01/22/2021   Abnormal gait 10/12/2019   Palpitations 06/13/2019   SVT (supraventricular tachycardia)    Sinus bradycardia    Osteoarthritis of both knees    Macular degeneration    Allergy    Advanced dry age-related macular degeneration of both eyes with subfoveal involvement 03/03/2016   Hypertensive retinopathy of both eyes 03/03/2016   Pseudophakia of both eyes 03/03/2016   PVD (posterior vitreous detachment), both eyes 03/03/2016   Orthostasis 01/19/2016   Dilated aortic root 01/19/2016   PSVT (paroxysmal supraventricular tachycardia) 01/19/2016   Mild aortic insufficiency 01/19/2016   Sinus bradycardia seen on cardiac monitor 01/18/2016   Postnasal drip 11/05/2014   Age-related osteoporosis without current pathological fracture 11/13/2013   Incontinence of feces 11/01/2013   Tobacco user 11/01/2013   Nicotine dependence, unspecified, in remission 11/01/2013   Bradycardia 01/30/2013   Syncope and collapse 01/12/2013   BPH (benign prostatic hyperplasia) 01/12/2013    Personal history of transient ischemic attack (TIA), and cerebral infarction without residual deficits 01/13/2012   History of stroke without residual deficits 01/13/2012   Osteoarthritis 05/21/2011   Lower urinary tract symptoms due to benign prostatic hyperplasia 05/21/2011    Palliative Care Assessment & Plan   Patient Profile:  89 y.o. male   admitted on 03/15/2024   89 y.o. male with medical history significant of dementia oriented only to self at baseline, BPH, hypertension, hyperlipidemia, osteoarthritis, bradycardia presented with altered mental status from SNF.   Assessment:  Patient with advanced dementia demonstrating progressive dysphagia, poor oral intake, weight loss, and overall functional decline consistent with late-stage disease trajectory. The patient is bedbound, minimally verbal, and dependent for all ADLs. Clinical picture suggests end-stage dementia with expected loss of swallowing function and increasing aspiration risk. Patient's wife is aware of the natural disease progression and that artificial nutrition or hydration would not improve quality of life or survival. Comfort feeding as tolerated has been recommended, with emphasis on mouth care, symptom control, and avoidance of burdensome interventions. Prognosis is poor, with likely limited life expectancy if current decline continues. Palliative care has been recommended at long term facility.   11-2: Long talk with patient's wife and son regarding the patient's current condition, discussed about prognosis, we talked about differences between long term care with palliative versus the type of care that is provided in a residential hospice facility. Answered their questions to the best of my ability, plan remains for long term care facility with palliative care to follow at this time, recommend TOC follow up in am.   11-3: Long term care facility with hospice Monitor PO intake and comfort feeds.  Continue to support  the patient and family.   Recommendations/Plan:   Long term care with hospice.   Code Status:    Code Status Orders  (From admission, onward)           Start     Ordered   03/15/24 1457  Do not attempt resuscitation (DNR)- Limited -Do Not  Intubate (DNI)  Continuous       Question Answer Comment  If pulseless and not breathing No CPR or chest compressions.   In Pre-Arrest Conditions (Patient Is Breathing and Has A Pulse) Do not intubate. Provide all appropriate non-invasive medical interventions. Avoid ICU transfer unless indicated or required.   Consent: Discussion documented in EHR or advanced directives reviewed      03/15/24 1459           Code Status History     Date Active Date Inactive Code Status Order ID Comments User Context   10/18/2023 2049 10/20/2023 1717 Limited: Do not attempt resuscitation (DNR) -DNR-LIMITED -Do Not Intubate/DNI  [id number]  Arthea Child, MD ED   07/31/2021 1351 08/08/2021 1818 DNR [id number]  Waddell Rake, MD ED   01/22/2021 1630 01/30/2021 2154 Full Code [id number]  Laurence Locus, DO Inpatient   01/22/2021 1611 01/22/2021 1630 Full Code [id number]  Laurence Locus, DO Inpatient   01/18/2016 2252 01/20/2016 1508 Full Code [id number]  Opyd, Evalene RAMAN, MD Inpatient       Prognosis:  < 12 months  Discharge Planning: LTC with hospice   Care plan was discussed with wife RN [facility] TR[facility] MD and elder law firm advocate.   Thank you for allowing the Palliative Medicine Team to assist in the care of this patient. High MDM.      Greater than 50%  of this time was spent counseling and coordinating care related to the above assessment and plan.  Lonia Serve, MD  Please contact Palliative Medicine Team phone at [phone] for questions and concerns.

## 2024-03-20 NOTE — Progress Notes (Addendum)
 This morning patient woke up around 9 am, some minimal verbal communication with RN at bedside. Took some small spoonfulls of cream of wheat, drank some water and about 1/3 of an orange juice (slowly and with queuing). Some coughing and pocketing noted. Mouth suctioned. Tubing and canisters changed

## 2024-03-21 DIAGNOSIS — N3000 Acute cystitis without hematuria: Secondary | ICD-10-CM | POA: Diagnosis not present

## 2024-03-21 DIAGNOSIS — Z7189 Other specified counseling: Secondary | ICD-10-CM

## 2024-03-21 LAB — GLUCOSE, CAPILLARY: Glucose-Capillary: 87 mg/dL (ref 70–99)

## 2024-03-21 NOTE — Plan of Care (Signed)
  Problem: Clinical Measurements: Goal: Cardiovascular complication will be avoided Outcome: Progressing   Problem: Clinical Measurements: Goal: Respiratory complications will improve Outcome: Progressing   Problem: Nutrition: Goal: Adequate nutrition will be maintained Outcome: Progressing

## 2024-03-21 NOTE — Plan of Care (Signed)

## 2024-03-21 NOTE — TOC Progression Note (Signed)
 Transition of Care [facility]) - Progression Note    Patient Details  Name: Haskel Dewalt MRN: [id number] Date of Birth: 09/12/1930  [facility] CM/SW Contact  Yaniyah Koors, Nathanel, RN Phone Number: 03/21/2024, 11:02 AM  Clinical Narrative:Spoke to spouse [name] about latest update on LTC-there are no bed offers-per PMT-recc LTC w/hospice. Patient is from [facility]-memory care-spouse does not want return-I have left vm w/[facility] rep Anna-to confirm if can accept back since prior they could accept back but family didn't want patient to return. if no LTC accepting in a reasonable time frame, & prior facility accepting for return-family may be able to have patient return & continue their search for a LTC facility.       Expected Discharge Plan: Long Term Nursing Home Barriers to Discharge: Continued Medical Work up               Expected Discharge Plan and Services   Discharge Planning Services: CM Consult Post Acute Care Choice: Nursing Home Living arrangements for the past 2 months: Assisted Living Facility (memory care)                 DME Arranged: N/A DME Agency: NA       HH Arranged: NA HH Agency: NA         Social Drivers of Health (SDOH) Interventions SDOH Screenings   Food Insecurity: No Food Insecurity (03/16/2024)  Housing: Low Risk  (03/16/2024)  Transportation Needs: No Transportation Needs (03/16/2024)  Utilities: Not At Risk (03/16/2024)  Tobacco Use: Medium Risk (03/19/2024)    Readmission Risk Interventions    03/16/2024    9:51 AM 10/19/2023    2:57 PM  Readmission Risk Prevention Plan  Post Dischage Appt  Complete  Medication Screening  Complete  Transportation Screening Complete Complete  PCP or Specialist Appt within 5-7 Days Complete   Home Care Screening Complete   Medication Review (RN CM) Complete

## 2024-03-21 NOTE — Progress Notes (Signed)
 PROGRESS NOTE    Kevin Page  FMW:[id number] DOB: April 15, 1931 DOA: 03/15/2024 PCP: Kevin Elsie [name], MD   Brief Narrative:  89 y.o. male with medical history significant of dementia oriented only to self at baseline, BPH, hypertension, hyperlipidemia, osteoarthritis, bradycardia presented with altered mental status from SNF.  On presentation, he was initially intermittently hypoxic but was afebrile.  Initial lactic acid was 2.7 but improved to 1.8 with IV fluids.  WBC of 8.4.  UA from 03/14/2024 was suggestive of UTI.  He was started on IV fluids and antibiotics.  CT head was negative for any acute intracranial abnormality.  Palliative care following.  Patient is currently medically stable for LTC placement; TOC following.  Assessment & Plan:   UTI: Present on admission Severe sepsis: Present on admission Staph epidermidis bacteremia: Most likely contaminant - Presented with altered mental status possibly from UTI.  UA from 03/14/2024 showed possible UTI.  Patient was more confused on presentation with lactic acidosis, initial hypotension - Urine culture growing E. coli.  Blood culture grew staph epi in 1 bottle: Most likely contaminant.  Rocephin  has been switched to oral cefadroxil : Finish total 7-day course of therapy.  Off IV fluids.  Encourage oral intake.  Leukocytosis - Resolved   Acute metabolic encephalopathy Dementia Dysphagia Underweight -Presented with worsening confusion possibly from UTI in a patient with dementia.  Concern for seizure as well.  Fall precautions.  Seizure/delirium precautions.  CT head on presentation as well was negative for any intracranial abnormality.  No seizures since admission.  EEG discontinued on 03/16/2024 as per family request. - Family do not want to pursue PT/OT eval. -Diet as per SLP recommendations.  Patient is at severe aspiration risk --B12, folate, ammonia and TSH levels normal  Goals of care - Palliative care evaluation appreciated.   Recommending long-term care with hospice - Oral intake getting overall worse  DVT prophylaxis: Lovenox  Code Status: DNR Family Communication: wife at bedside Disposition Plan: Status is: Inpatient Remains inpatient appropriate because: Of severity of illness.  Patient will need LTC placement with hospice.  TOC following.  Currently medically stable for discharge.    Consultants: palliative care  Procedures: None  Antimicrobials:  Anti-infectives (From admission, onward)    Start     Dose/Rate Route Frequency Ordered Stop   03/18/24 1000  cefadroxil  (DURICEF) capsule 500 mg        500 mg Oral 2 times daily 03/17/24 1335 03/22/24 0959   03/16/24 1000  cefTRIAXone  (ROCEPHIN ) 2 g in sodium chloride  0.9 % 100 mL IVPB  Status:  Discontinued        2 g 200 mL/hr over 30 Minutes Intravenous Daily 03/16/24 0941 03/17/24 1335   03/15/24 1330  cefTRIAXone  (ROCEPHIN ) 2 g in sodium chloride  0.9 % 100 mL IVPB        2 g 200 mL/hr over 30 Minutes Intravenous Once 03/15/24 1318 03/15/24 1510        Subjective: Patient seen and examined at bedside.  Poor historian.  No agitation, fever or vomiting reported.  Oral intake continues to remain poor. Objective: Vitals:   03/20/24 0515 03/20/24 1352 03/20/24 2026 03/21/24 0352  BP: (!) 154/101 (!) 159/77 (!) 178/97 (!) 165/98  Pulse: 65 72 75 66  Resp: 18 19 18 18   Temp: 97.9 F (36.6 C) 97.9 F (36.6 C) 98.6 F (37 C) 98.5 F (36.9 C)  TempSrc: Oral Oral Oral Oral  SpO2: 90% 90% 93% 91%  Weight:  Height:        Intake/Output Summary (Last 24 hours) at 03/21/2024 0753 Last data filed at 03/21/2024 0354 Gross per 24 hour  Intake 190 ml  Output 600 ml  Net -410 ml   Filed Weights   03/15/24 1811  Weight: 58.1 kg    Examination:  General: No acute distress and on room air.  Elderly male in bed.  Extremely slow to respond.  Poor historian.  Remains confused.  Flat affect.  respiratory: Decreased breath sounds at bases  bilaterally with some scattered crackles  CVS: S1-S2 heard; currently rate controlled  abdominal: Soft, nontender, distended mildly; no organomegaly, normal bowel sounds are heard  extremities: Trace lower extremity edema present; no cyanosis   data Reviewed: I have personally reviewed following labs and imaging studies  CBC: Recent Labs  Lab 03/15/24 1152 03/16/24 0534 03/17/24 0521  WBC 8.4 14.5* 7.7  NEUTROABS 7.1  --  5.5  HGB 10.2* 11.1* 11.9*  HCT 32.2* 34.6* 36.2*  MCV 97.9 93.5 95.5  PLT 206 280 251   Basic Metabolic Panel: Recent Labs  Lab 03/15/24 1152 03/16/24 0534 03/17/24 0521  NA 137 137 139  K 4.1 4.0 3.8  CL 101 101 105  CO2 24 24 25   GLUCOSE 95 93 84  BUN 10 10 11   CREATININE 0.87 0.76 0.65  CALCIUM 8.9 8.3* 8.1*  MG 2.1  --  1.8   GFR: Estimated Creatinine Clearance: 49.4 mL/min (by C-G formula based on SCr of 0.65 mg/dL). Liver Function Tests: Recent Labs  Lab 03/15/24 1152 03/16/24 0534  AST 32 29  ALT 11 12  ALKPHOS 117 102  BILITOT 0.9 0.8  PROT 5.7* 5.0*  ALBUMIN 3.2* 2.9*   No results for input(s): LIPASE, AMYLASE in the last 168 hours. Recent Labs  Lab 03/16/24 0534  AMMONIA 24   Coagulation Profile: No results for input(s): INR, PROTIME in the last 168 hours. Cardiac Enzymes: Recent Labs  Lab 03/15/24 1152  CKTOTAL 123   BNP (last 3 results) No results for input(s): PROBNP in the last 8760 hours. HbA1C: No results for input(s): HGBA1C in the last 72 hours. CBG: No results for input(s): GLUCAP in the last 168 hours.  Lipid Profile: No results for input(s): CHOL, HDL, LDLCALC, TRIG, CHOLHDL, LDLDIRECT in the last 72 hours. Thyroid  Function Tests: No results for input(s): TSH, T4TOTAL, FREET4, T3FREE, THYROIDAB in the last 72 hours.  Anemia Panel: No results for input(s): VITAMINB12, FOLATE, FERRITIN, TIBC, IRON, RETICCTPCT in the last 72 hours.  Sepsis Labs: Recent  Labs  Lab 03/15/24 1159 03/15/24 1204 03/15/24 1330 03/15/24 1600  LATICACIDVEN 2.7* 2.7* 1.8 1.7    Recent Results (from the past 240 hours)  Urine Culture     Status: Abnormal   Collection Time: 03/14/24  1:16 PM   Specimen: Urine, Random  Result Value Ref Range Status   Specimen Description   Final    URINE, RANDOM Performed at [facility], [address], [place], [place] [address]    Special Requests   Final    NONE Reflexed from [id number] Performed at [facility], [address] [place], [place] [address]    Culture (A)  Final    >=100,000 COLONIES/mL ESCHERICHIA COLI Two isolates with different morphologies were identified as the same organism.The most resistant organism was reported. Performed at [facility], [address] [place], [place] [address]    Report Status 03/17/2024 FINAL  Final  Organism ID, Bacteria ESCHERICHIA COLI (A)  Final      Susceptibility   Escherichia coli - MIC*    AMPICILLIN >=32 RESISTANT Resistant     CEFAZOLIN (URINE) Value in next row Sensitive      4 SENSITIVEThis is a modified FDA-approved test that has been validated and its performance characteristics determined by the reporting laboratory.  This laboratory is certified under the Clinical Laboratory Improvement Amendments CLIA as qualified to perform high complexity clinical laboratory testing.    CEFEPIME Value in next row Sensitive      4 SENSITIVEThis is a modified FDA-approved test that has been validated and its performance characteristics determined by the reporting laboratory.  This laboratory is certified under the Clinical Laboratory Improvement Amendments CLIA as qualified to perform high complexity clinical laboratory testing.    ERTAPENEM Value in next row Sensitive      4 SENSITIVEThis is a modified FDA-approved test that has been validated and its performance characteristics determined by the reporting laboratory.  This  laboratory is certified under the Clinical Laboratory Improvement Amendments CLIA as qualified to perform high complexity clinical laboratory testing.    CEFTRIAXONE  Value in next row Sensitive      4 SENSITIVEThis is a modified FDA-approved test that has been validated and its performance characteristics determined by the reporting laboratory.  This laboratory is certified under the Clinical Laboratory Improvement Amendments CLIA as qualified to perform high complexity clinical laboratory testing.    CIPROFLOXACIN Value in next row Intermediate      4 SENSITIVEThis is a modified FDA-approved test that has been validated and its performance characteristics determined by the reporting laboratory.  This laboratory is certified under the Clinical Laboratory Improvement Amendments CLIA as qualified to perform high complexity clinical laboratory testing.    GENTAMICIN Value in next row Sensitive      4 SENSITIVEThis is a modified FDA-approved test that has been validated and its performance characteristics determined by the reporting laboratory.  This laboratory is certified under the Clinical Laboratory Improvement Amendments CLIA as qualified to perform high complexity clinical laboratory testing.    NITROFURANTOIN Value in next row Sensitive      4 SENSITIVEThis is a modified FDA-approved test that has been validated and its performance characteristics determined by the reporting laboratory.  This laboratory is certified under the Clinical Laboratory Improvement Amendments CLIA as qualified to perform high complexity clinical laboratory testing.    TRIMETH/SULFA Value in next row Resistant      4 SENSITIVEThis is a modified FDA-approved test that has been validated and its performance characteristics determined by the reporting laboratory.  This laboratory is certified under the Clinical Laboratory Improvement Amendments CLIA as qualified to perform high complexity clinical laboratory testing.     AMPICILLIN/SULBACTAM Value in next row Intermediate      4 SENSITIVEThis is a modified FDA-approved test that has been validated and its performance characteristics determined by the reporting laboratory.  This laboratory is certified under the Clinical Laboratory Improvement Amendments CLIA as qualified to perform high complexity clinical laboratory testing.    PIP/TAZO Value in next row Sensitive      <=4 SENSITIVEThis is a modified FDA-approved test that has been validated and its performance characteristics determined by the reporting laboratory.  This laboratory is certified under the Clinical Laboratory Improvement Amendments CLIA as qualified to perform high complexity clinical laboratory testing.    MEROPENEM Value in next row Sensitive      <=4 SENSITIVEThis is a modified  FDA-approved test that has been validated and its performance characteristics determined by the reporting laboratory.  This laboratory is certified under the Clinical Laboratory Improvement Amendments CLIA as qualified to perform high complexity clinical laboratory testing.    * >=100,000 COLONIES/mL ESCHERICHIA COLI  Urine Culture (for pregnant, neutropenic or urologic patients or patients with an indwelling urinary catheter)     Status: Abnormal   Collection Time: 03/14/24  1:16 PM   Specimen: Urine, Clean Catch  Result Value Ref Range Status   Specimen Description   Final    URINE, CLEAN CATCH Performed at [facility], [address] [place], [place] [address]    Special Requests   Final    NONE Performed at [facility], [address] [place], [place] [address]    Culture >=100,000 COLONIES/mL ESCHERICHIA COLI (A)  Final   Report Status 03/17/2024 FINAL  Final   Organism ID, Bacteria ESCHERICHIA COLI (A)  Final      Susceptibility   Escherichia coli - MIC*    AMPICILLIN >=32 RESISTANT Resistant     CEFAZOLIN (URINE) Value in next row Sensitive      4 SENSITIVEThis is a  modified FDA-approved test that has been validated and its performance characteristics determined by the reporting laboratory.  This laboratory is certified under the Clinical Laboratory Improvement Amendments CLIA as qualified to perform high complexity clinical laboratory testing.    CEFEPIME Value in next row Sensitive      4 SENSITIVEThis is a modified FDA-approved test that has been validated and its performance characteristics determined by the reporting laboratory.  This laboratory is certified under the Clinical Laboratory Improvement Amendments CLIA as qualified to perform high complexity clinical laboratory testing.    ERTAPENEM Value in next row Sensitive      4 SENSITIVEThis is a modified FDA-approved test that has been validated and its performance characteristics determined by the reporting laboratory.  This laboratory is certified under the Clinical Laboratory Improvement Amendments CLIA as qualified to perform high complexity clinical laboratory testing.    CEFTRIAXONE  Value in next row Sensitive      4 SENSITIVEThis is a modified FDA-approved test that has been validated and its performance characteristics determined by the reporting laboratory.  This laboratory is certified under the Clinical Laboratory Improvement Amendments CLIA as qualified to perform high complexity clinical laboratory testing.    CIPROFLOXACIN Value in next row Sensitive      4 SENSITIVEThis is a modified FDA-approved test that has been validated and its performance characteristics determined by the reporting laboratory.  This laboratory is certified under the Clinical Laboratory Improvement Amendments CLIA as qualified to perform high complexity clinical laboratory testing.    GENTAMICIN Value in next row Sensitive      4 SENSITIVEThis is a modified FDA-approved test that has been validated and its performance characteristics determined by the reporting laboratory.  This laboratory is certified under the Clinical  Laboratory Improvement Amendments CLIA as qualified to perform high complexity clinical laboratory testing.    NITROFURANTOIN Value in next row Sensitive      4 SENSITIVEThis is a modified FDA-approved test that has been validated and its performance characteristics determined by the reporting laboratory.  This laboratory is certified under the Clinical Laboratory Improvement Amendments CLIA as qualified to perform high complexity clinical laboratory testing.    TRIMETH/SULFA Value in next row Resistant      4 SENSITIVEThis is a modified FDA-approved test that has been validated and  its performance characteristics determined by the reporting laboratory.  This laboratory is certified under the Clinical Laboratory Improvement Amendments CLIA as qualified to perform high complexity clinical laboratory testing.    AMPICILLIN/SULBACTAM Value in next row Intermediate      4 SENSITIVEThis is a modified FDA-approved test that has been validated and its performance characteristics determined by the reporting laboratory.  This laboratory is certified under the Clinical Laboratory Improvement Amendments CLIA as qualified to perform high complexity clinical laboratory testing.    PIP/TAZO Value in next row Sensitive      <=4 SENSITIVEThis is a modified FDA-approved test that has been validated and its performance characteristics determined by the reporting laboratory.  This laboratory is certified under the Clinical Laboratory Improvement Amendments CLIA as qualified to perform high complexity clinical laboratory testing.    MEROPENEM Value in next row Sensitive      <=4 SENSITIVEThis is a modified FDA-approved test that has been validated and its performance characteristics determined by the reporting laboratory.  This laboratory is certified under the Clinical Laboratory Improvement Amendments CLIA as qualified to perform high complexity clinical laboratory testing.    * >=100,000 COLONIES/mL ESCHERICHIA COLI   Culture, blood (single)     Status: Abnormal   Collection Time: 03/15/24  1:23 PM   Specimen: BLOOD  Result Value Ref Range Status   Specimen Description   Final    BLOOD A-LINE Performed at [facility], [address], [place], [place] [address]    Special Requests   Final    BOTTLES DRAWN AEROBIC AND ANAEROBIC Blood Culture results may not be optimal due to an inadequate volume of blood received in culture bottles Performed at [facility], [address], [place], [place] [address]    Culture  Setup Time   Final    GRAM POSITIVE COCCI IN CLUSTERS ANAEROBIC BOTTLE ONLY CRITICAL RESULT CALLED TO, READ BACK BY AND VERIFIED WITH: PHARMD E JACKSON 03/16/2024 @ 2225 BY AB    Culture (A)  Final    STAPHYLOCOCCUS EPIDERMIDIS THE SIGNIFICANCE OF ISOLATING THIS ORGANISM FROM A SINGLE SET OF BLOOD CULTURES WHEN MULTIPLE SETS ARE DRAWN IS UNCERTAIN. PLEASE NOTIFY THE MICROBIOLOGY DEPARTMENT WITHIN ONE WEEK IF SPECIATION AND SENSITIVITIES ARE REQUIRED. Performed at [facility], [address] [place], [place] [address]    Report Status 03/18/2024 FINAL  Final  Blood Culture ID Panel (Reflexed)     Status: Abnormal   Collection Time: 03/15/24  1:23 PM  Result Value Ref Range Status   Enterococcus faecalis NOT DETECTED NOT DETECTED Final   Enterococcus Faecium NOT DETECTED NOT DETECTED Final   Listeria monocytogenes NOT DETECTED NOT DETECTED Final   Staphylococcus species DETECTED (A) NOT DETECTED Final    Comment: CRITICAL RESULT CALLED TO, READ BACK BY AND VERIFIED WITH: PHARMD E JACKSON 03/16/2024 @ 2225 BY AB    Staphylococcus aureus (BCID) NOT DETECTED NOT DETECTED Final   Staphylococcus epidermidis DETECTED (A) NOT DETECTED Final    Comment: CRITICAL RESULT CALLED TO, READ BACK BY AND VERIFIED WITH: PHARMD E JACKSON 03/16/2024 @ 2225 BY AB    Staphylococcus lugdunensis NOT DETECTED NOT DETECTED Final   Streptococcus species NOT  DETECTED NOT DETECTED Final   Streptococcus agalactiae NOT DETECTED NOT DETECTED Final   Streptococcus pneumoniae NOT DETECTED NOT DETECTED Final   Streptococcus pyogenes NOT DETECTED NOT DETECTED Final   A.calcoaceticus-baumannii NOT DETECTED NOT DETECTED Final   Bacteroides fragilis NOT DETECTED NOT DETECTED Final  Enterobacterales NOT DETECTED NOT DETECTED Final   Enterobacter cloacae complex NOT DETECTED NOT DETECTED Final   Escherichia coli NOT DETECTED NOT DETECTED Final   Klebsiella aerogenes NOT DETECTED NOT DETECTED Final   Klebsiella oxytoca NOT DETECTED NOT DETECTED Final   Klebsiella pneumoniae NOT DETECTED NOT DETECTED Final   Proteus species NOT DETECTED NOT DETECTED Final   Salmonella species NOT DETECTED NOT DETECTED Final   Serratia marcescens NOT DETECTED NOT DETECTED Final   Haemophilus influenzae NOT DETECTED NOT DETECTED Final   Neisseria meningitidis NOT DETECTED NOT DETECTED Final   Pseudomonas aeruginosa NOT DETECTED NOT DETECTED Final   Stenotrophomonas maltophilia NOT DETECTED NOT DETECTED Final   Candida albicans NOT DETECTED NOT DETECTED Final   Candida auris NOT DETECTED NOT DETECTED Final   Candida glabrata NOT DETECTED NOT DETECTED Final   Candida krusei NOT DETECTED NOT DETECTED Final   Candida parapsilosis NOT DETECTED NOT DETECTED Final   Candida tropicalis NOT DETECTED NOT DETECTED Final   Cryptococcus neoformans/gattii NOT DETECTED NOT DETECTED Final   Methicillin resistance mecA/C NOT DETECTED NOT DETECTED Final    Comment: Performed at [facility], [address], [place], [place] [address]  Blood culture (routine x 2)     Status: None   Collection Time: 03/15/24  7:35 PM   Specimen: BLOOD  Result Value Ref Range Status   Specimen Description   Final    BLOOD BLOOD LEFT ARM Performed at [facility], [address] [place], [place] [address]    Special Requests   Final    Blood Culture adequate volume BOTTLES DRAWN  AEROBIC AND ANAEROBIC Performed at [facility], [address], [place], [place] [address]    Culture   Final    NO GROWTH 5 DAYS Performed at [facility], [address], [place], [place] [address]    Report Status 03/20/2024 FINAL  Final  Blood culture (routine x 2)     Status: None   Collection Time: 03/15/24  7:40 PM   Specimen: BLOOD  Result Value Ref Range Status   Specimen Description BLOOD SITE NOT SPECIFIED  Final   Special Requests   Final    BOTTLES DRAWN AEROBIC ONLY Blood Culture adequate volume   Culture   Final    NO GROWTH 5 DAYS Performed at [facility], [address] [place], [place] [address]    Report Status 03/20/2024 FINAL  Final         Radiology Studies: No results found.       Scheduled Meds:  cefadroxil   500 mg Oral BID   donepezil   10 mg Oral QHS   enoxaparin  (LOVENOX ) injection  40 mg Subcutaneous Q24H   feeding supplement  237 mL Oral BID BM   mouth rinse  15 mL Mouth Rinse 4 times per day   Continuous Infusions:          Sophie Mao, MD [facility] 03/21/2024, 7:53 AM

## 2024-03-21 NOTE — Progress Notes (Signed)
  Daily Progress Note   Patient Name: Kevin Page       Date: 03/21/2024 DOB: 1931-03-08  Age: 89 y.o. MRN#: [id number] Attending Physician: Cheryle Page, MD Primary Care Physician: Loreli Elsie [name], MD Admit Date: 03/15/2024 Length of Stay: 6 days  Reason for Consultation/Follow-up: Establishing goals of care  Subjective:   Reviewed EMR including recent documentation from hospitalist and TOC.  Discussed care with hospitalist, TOC, and bedside RN to coordinate care.  Current recommendation is for patient to go to long-term care facility with hospice support. TOC discussed with patient's spouse, Kevin Page.  Kevin Page does not want patient to return to [facility], where patient was receiving memory care.  Attempting to find another long-term care facility.  Presented to bedside to see patient.  Patient sleeping comfortably in bed initially though easily awakened by wife who was present at bedside.  Patient noted that he was hungry and looking forward to lunch.  Wife updated this provider about her discussions with TOC and their elder law advocate.  Working to find long-term care placement with hospice support at this time.  Spent time providing emotional support via active listening.  Noted palliative medicine team to continue to follow along with patient's medical journey.  Objective:   Vital Signs:  BP (!) 165/98   Pulse 66   Temp 98.5 F (36.9 C) (Oral)   Resp 18   Ht 6' (1.829 m)   Wt 58.1 kg   SpO2 91%   BMI 17.37 kg/m   Physical Exam: General: Awake, chronically ill-appearing, frail, cachectic Cardiovascular: RRR Respiratory: no increased work of breathing noted, not in respiratory distress  Assessment & Plan:   Assessment: Patient is a 89 year old male with a past medical history of dementia oriented only to self at baseline, BPH, hypertension, hyperlipidemia, osteoarthritis, and bradycardia who was admitted on 03/15/2024 from memory care facility due to altered mental  status.  During hospitalization patient has received management for sepsis secondary to UTI and acute metabolic encephalopathy.  Palliative medicine team consulted to assist with complex medical decision making.  Recommendations/Plan: # Complex medical decision making/goals of care:  - Patient unable to participate in complex medical decision making.  - Discussed care with patient's wife as detailed above in HPI.  Currently wife along with their advocate and TOC working to determine long-term care placement with hospice support.  Palliative medicine team continue to follow along to engage in conversations as able and appropriate moving forward.  -  Code Status: Limited: Do not attempt resuscitation (DNR) -DNR-LIMITED -Do Not Intubate/DNI   # Psychosocial Support:  - Wife-Sally  # Discharge Planning: To Be Determined  - TOC and patient's Elderlaw advocate working to determine placement for long-term care with hospice support  Discussed with: Patient's wife, hospitalist, [facility]  Thank you for allowing the palliative care team to participate in the care Kevin Page.  Tinnie Radar, DO Palliative Care Provider PMT # [phone]  If patient remains symptomatic despite maximum doses, please call PMT at [phone] between 0700 and 1900. Outside of these hours, please call attending, as PMT does not have night coverage.

## 2024-03-22 DIAGNOSIS — N3000 Acute cystitis without hematuria: Secondary | ICD-10-CM | POA: Diagnosis not present

## 2024-03-22 DIAGNOSIS — G9341 Metabolic encephalopathy: Secondary | ICD-10-CM

## 2024-03-22 NOTE — TOC Transition Note (Signed)
 Transition of Care [facility]) - Discharge Note   Patient Details  Name: Kevin Page MRN: [id number] Date of Birth: [birth year]  Transition of Care [facility]) CM/SW Contact:  Doneta Glenys DASEN, RN Phone Number: 03/22/2024, 1:24 PM   Clinical Narrative:    Per MD patient ready for DC to [facility]. RN to call report prior to discharge [phone] [phone]). RN, patient, patient's family, and facility notified of DC. Discharge Summary and FL2 sent to facility via HUB. DC packet on chart includes face sheet, medical necessity, signed DNR. Ambulance [facility] transport requested for patient.  Please consult us  again if new needs arise.   Final next level of care: Long Term Acute Care (LTAC) Barriers to Discharge: Barriers Resolved   Patient Goals and CMS Choice Patient states their goals for this hospitalization and ongoing recovery are:: Per wife to [facility] CMS Medicare.gov Compare Post Acute Care list provided to:: Patient Represenative (must comment) (wife [name]) Choice offered to / list presented to : Spouse [place] ownership interest in [facility] [address] [facility].provided to:: Spouse    Discharge Placement              Patient chooses bed at: [facility] Patient to be transferred to facility by: [facility] Name of family member notified: [name] wife Patient and family notified of of transfer: 03/22/24  Discharge Plan and Services Additional resources added to the After Visit Summary for     Discharge Planning Services: CM Consult Post Acute Care Choice: Nursing Home          DME Arranged: N/A DME Agency: NA       HH Arranged: NA HH Agency: NA        Social Drivers of Health (SDOH) Interventions SDOH Screenings   Food Insecurity: No Food Insecurity (03/16/2024)  Housing: Low Risk  (03/16/2024)  Transportation Needs: No Transportation Needs (03/16/2024)  Utilities: Not At Risk (03/16/2024)  Tobacco Use: Medium Risk (03/19/2024)     Readmission Risk  Interventions    03/16/2024    9:51 AM 10/19/2023    2:57 PM  Readmission Risk Prevention Plan  Post Dischage Appt  Complete  Medication Screening  Complete  Transportation Screening Complete Complete  PCP or Specialist Appt within 5-7 Days Complete   Home Care Screening Complete   Medication Review (RN CM) Complete

## 2024-03-22 NOTE — Final Progress Note (Signed)
 RN attempted to call facility to give report. Unable to get in touch with anyone. VM left with RN's name and phone number.

## 2024-03-22 NOTE — Discharge Summary (Addendum)
 Physician Discharge Summary   Patient: Kevin Page MRN: [id number] DOB: [birth year]  Admit date:     03/15/2024  Discharge date: 03/22/24  Discharge Physician: Toribio Door   PCP: Kevin Elsie [name], MD   Recommendations at discharge:  Discharge to long-term care with hospice, see below  Discharge Diagnoses: Principal Problem:   UTI (urinary tract infection) Active Problems: General weakness Acute metabolic encephalopathy Severe dementia Dysphagia Underweight  Hospital Course: 89 year old male PMH dementia oriented to self only at baseline, who presented from SNF with altered mental status, with recent history of shaking spell and discharged from ED on antibiotics for UTI.  Reportedly on presentation again to the emergency department there had been an episode of cyanosis prior to admission.  Also was reportedly hypotensive in the field.  Admitted for UTI and sepsis.  Treated with antibiotics with gradual clinical improvement.  Plans were made for long-term care with hospice.  Consultants Palliative care  Procedures/Events None  UTI Severe sepsis ruled out Sepsis ruled out  Acute metabolic encephalopathy Was treated with empiric antibiotics with gradual clinical improvement, completed 7-day course of therapy based off culture results.  Spurious blood culture positive Staph epidermidis.  Contaminant. Presented with worsening confusion.  Although there was reported shaking episode, further details are unrevealing.  Some concern for seizure was noted but I see no history this highly worrisome.  CT head was negative and family declined EEG.  No further evaluation or treatment is suggested at this time.  Severe dementia Dysphagia Underweight Body mass index is 17.37 kg/m. Dementia seems to be at baseline Patient seen by speech therapy noted to be severe aspiration risk.  Comfort diet was recommended dysphagia 3, thin liquid.  Wife understands.  Disposition Seen by  palliative medicine.  Plan long-term care with hospice DNR  I met with wife today at bedside she agrees with transfer to [facility] Page.  She understands comfort feeds.  Disposition: LTC with hospice Diet recommendation:  Diet Orders (From admission, onward)     Start     Ordered   03/22/24 0000  Diet - low sodium heart healthy        03/22/24 1245   03/19/24 1317  DIET DYS 3 Room service appropriate? Yes; Fluid consistency: Thin  Diet effective now       Question Answer Comment  Room service appropriate? Yes   Fluid consistency: Thin      03/19/24 1317            DISCHARGE MEDICATION: Allergies as of 03/22/2024   No Known Allergies      Medication List     STOP taking these medications    cefdinir 300 MG capsule Commonly known as: OMNICEF   Vitamin D3 Super Strength 50 MCG (2000 UT) Caps Generic drug: Cholecalciferol        TAKE these medications    donepezil  10 MG tablet Commonly known as: ARICEPT  Take 10 mg by mouth at bedtime.   dutasteride  0.5 MG capsule Commonly known as: AVODART  Take 0.5 mg by mouth in the morning.   guaiFENesin  600 MG 12 hr tablet Commonly known as: MUCINEX  Take 1 tablet (600 mg total) by mouth 2 (two) times daily. What changed:  when to take this reasons to take this   MACULAR VITAMIN BENEFIT PO Take 1 capsule by mouth See admin instructions. Macular Protect Complete - Take 2 capsules by mouth in the morning   METAMUCIL PO Take 2 capsules by mouth in the morning.   pantoprazole  40  MG tablet Commonly known as: PROTONIX  Take 40 mg by mouth daily before breakfast. What changed: Another medication with the same name was removed. Continue taking this medication, and follow the directions you see here.   triamcinolone  cream 0.1 % Commonly known as: KENALOG  Apply 1 Application topically daily as needed (for itching- right side, shoulder, and arm).       Feels ok today Discharge Exam: Filed Weights   03/15/24 1811  Weight:  58.1 kg   Physical Exam Vitals reviewed.  Constitutional:      General: He is not in acute distress.    Appearance: He is not ill-appearing or toxic-appearing.  Cardiovascular:     Rate and Rhythm: Normal rate and regular rhythm.     Heart sounds: No murmur heard. Pulmonary:     Effort: Pulmonary effort is normal. No respiratory distress.     Breath sounds: No wheezing, rhonchi or rales.  Neurological:     Mental Status: He is alert.  Psychiatric:        Mood and Affect: Mood normal.        Behavior: Behavior normal.      Condition at discharge: fair  The results of significant diagnostics from this hospitalization (including imaging, microbiology, ancillary and laboratory) are listed below for reference.   Imaging Studies: CT Head Wo Contrast Result Date: 03/15/2024 EXAM: CT HEAD WITHOUT CONTRAST 03/15/2024 01:48:50 PM TECHNIQUE: CT of the head was performed without the administration of intravenous contrast. Automated exposure control, iterative reconstruction, and/or weight based adjustment of the mA/kV was utilized to reduce the radiation dose to as low as reasonably achievable. COMPARISON: 03/14/2024 CLINICAL HISTORY: Mental status change, unknown cause. FINDINGS: BRAIN AND VENTRICLES: No acute hemorrhage. No evidence of acute infarct. No hydrocephalus. No extra-axial collection. No mass effect or midline shift. There is diffuse cerebral atrophy and chronic small vessel disease throughout the deep white matter. ORBITS: No acute abnormality. SINUSES: No acute abnormality. SOFT TISSUES AND SKULL: No acute soft tissue abnormality. No skull fracture. IMPRESSION: 1. No acute intracranial abnormality. 2. Diffuse cerebral atrophy. 3. Chronic small vessel disease throughout the deep white matter. Electronically signed by: Franky Crease MD 03/15/2024 02:38 PM EDT RP Workstation: [id number]   CT Head Wo Contrast Result Date: 03/14/2024 EXAM: CT HEAD WITHOUT CONTRAST 03/14/2024 08:47:00 AM  TECHNIQUE: CT of the head was performed without the administration of intravenous contrast. Automated exposure control, iterative reconstruction, and/or weight based adjustment of the mA/kV was utilized to reduce the radiation dose to as low as reasonably achievable. COMPARISON: CT head Mar 06, 2024 CLINICAL HISTORY: Dementia, query first time seizure. Patient arrives by EMS, per [facility] patient had a shaking episode for 5 minutes while they were changing him in bed. Baseline history of dementia. FINDINGS: BRAIN AND VENTRICLES: Motion limited study. No acute hemorrhage. No evidence of acute infarct. No hydrocephalus. No extra-axial collection. No mass effect or midline shift. Cerebral atrophy. ORBITS: No acute abnormality. SINUSES: No acute abnormality. SOFT TISSUES AND SKULL: No acute soft tissue abnormality. No skull fracture. IMPRESSION: 1. No acute intracranial abnormality on motion limited assessment. 2. Cerebral atrophy. Electronically signed by: Gilmore Molt MD 03/14/2024 09:05 AM EDT RP Workstation: [id number]   CT Cervical Spine Wo Contrast Result Date: 03/06/2024 EXAM: CT CERVICAL SPINE WITHOUT CONTRAST 03/06/2024 09:22:31 AM TECHNIQUE: CT of the cervical spine was performed without the administration of intravenous contrast. Multiplanar reformatted images are provided for review. Automated exposure control, iterative reconstruction, and/or weight based adjustment of the mA/kV  was utilized to reduce the radiation dose to as low as reasonably achievable. COMPARISON: CT of the cervical spine dated 12/08/2019. CLINICAL HISTORY: Neck trauma (Age >= 65y). BIB [facility] from [facility] s/p unwitnessed fall in the night, found on floor on padded mat at [facility]. H/o same frequently. Reported sleeps on edge of bed. Skin tear to R first digit, and below L eye/cheek. C-collar applied/ present. VSS. BP 150/90, HR ; 60, RR 18, SPO2 94% RA. CBG 105. Arrives alert, NAD, calm, interactive, HOH, moans generally  with most movements (described as normal for pt). H/o dementia FINDINGS: CERVICAL SPINE: BONES AND ALIGNMENT: No acute fracture or traumatic malalignment. There is slight degenerative anterolisthesis at C2-C3 and mild degenerative anterolisthesis at C7-T1. DEGENERATIVE CHANGES: There is moderate diffuse degenerative disc disease throughout the cervical spine with near complete loss of the disc space height and mild posterior endplate ridging. There is moderate diffuse cervical facet arthrosis also present resulting in mild-to-moderate central spinal canal stenosis and neural foraminal stenosis at multiple levels. SOFT TISSUES: No prevertebral soft tissue swelling. There is moderate calcific atheromatous disease within the carotid bulbs. IMPRESSION: 1. No acute abnormality of the cervical spine related to the reported neck trauma. 2. Moderate diffuse degenerative disc disease throughout the cervical spine with near complete loss of the disc space height and mild posterior endplate ridging, resulting in mild-to-moderate-to-moderate central spinal canal stenosis and neural foraminal stenosis at multiple levels. Electronically signed by: Evalene Coho MD 03/06/2024 09:51 AM EDT RP Workstation: [id number]   DG Hand Complete Right Result Date: 03/06/2024 EXAM: 3 OR MORE VIEW(S) XRAY OF THE RIGHT HAND 03/06/2024 09:29:00 AM COMPARISON: None available. CLINICAL HISTORY: fall with skin tear to R hand. FINDINGS: BONES AND JOINTS: No acute fracture. No focal osseous lesion. No joint dislocation. Ulnar negative variance. Widening of the scapholunate distance indicative of a scapholunate ligament tear. Mild degenerative changes of the hand. Osteopenia. SOFT TISSUES: The soft tissues are unremarkable. IMPRESSION: 1. Widening of the scapholunate distance indicative of a scapholunate ligament tear. 2. Mild degenerative changes of the hand. 3. Osteopenia. Electronically signed by: Evalene Coho MD 03/06/2024 09:47 AM EDT  RP Workstation: [id number]   CT Head Wo Contrast Result Date: 03/06/2024 EXAM: CT HEAD WITHOUT CONTRAST 03/06/2024 09:22:31 AM TECHNIQUE: CT of the head was performed without the administration of intravenous contrast. Automated exposure control, iterative reconstruction, and/or weight based adjustment of the mA/kV was utilized to reduce the radiation dose to as low as reasonably achievable. COMPARISON: CT of the head dated 07/31/2021. CLINICAL HISTORY: BIB [facility] from [facility] s/p unwitnessed fall in the night, found on floor on padded mat at [facility]. H/o same frequently. Reported sleeps on edge of bed. Skin tear to R first digit, and below L eye/cheek. C-collar applied/ present. VSS. BP 150/90, HR ; 60, RR 18, SPO2 94% RA. CBG 105. Arrives alert, NAD, calm, interactive, HOH, moans generally with most movements (described as normal for pt). H/o dementia. FINDINGS: BRAIN AND VENTRICLES: No acute hemorrhage. No evidence of acute infarct. No hydrocephalus. No extra-axial collection. No mass effect or midline shift. Moderate chronic microvascular ischemic change with generalized volume loss. ORBITS: Bilateral lens replacement. An occult fracture of the left orbital floor cannot be excluded. SINUSES: There is mixed attenuation fluid/secretions layering dependently within the left maxillary sinus. SOFT TISSUES AND SKULL: No acute soft tissue abnormality. No skull fracture. IMPRESSION: 1. No acute intracranial abnormality. 2. Moderate chronic microvascular ischemic change with generalized volume loss. 3. Mixed attenuation fluid/secretions layering  dependently within the left maxillary sinus. An occult fracture of the left orbital floor cannot be excluded. Electronically signed by: Evalene Coho MD 03/06/2024 09:46 AM EDT RP Workstation: [id number]    Microbiology: Results for orders placed or performed during the hospital encounter of 03/15/24  Urine Culture (for pregnant, neutropenic or urologic patients or  patients with an indwelling urinary catheter)     Status: Abnormal   Collection Time: 03/14/24  1:16 PM   Specimen: Urine, Clean Catch  Result Value Ref Range Status   Specimen Description   Final    URINE, CLEAN CATCH Performed at [facility], [address] [place], [place] [address]    Special Requests   Final    NONE Performed at [facility], [address], [place], [place] [address]    Culture >=100,000 COLONIES/mL ESCHERICHIA COLI (A)  Final   Report Status 03/17/2024 FINAL  Final   Organism ID, Bacteria ESCHERICHIA COLI (A)  Final      Susceptibility   Escherichia coli - MIC*    AMPICILLIN >=32 RESISTANT Resistant     CEFAZOLIN (URINE) Value in next row Sensitive      4 SENSITIVEThis is a modified FDA-approved test that has been validated and its performance characteristics determined by the reporting laboratory.  This laboratory is certified under the Clinical Laboratory Improvement Amendments CLIA as qualified to perform high complexity clinical laboratory testing.    CEFEPIME Value in next row Sensitive      4 SENSITIVEThis is a modified FDA-approved test that has been validated and its performance characteristics determined by the reporting laboratory.  This laboratory is certified under the Clinical Laboratory Improvement Amendments CLIA as qualified to perform high complexity clinical laboratory testing.    ERTAPENEM Value in next row Sensitive      4 SENSITIVEThis is a modified FDA-approved test that has been validated and its performance characteristics determined by the reporting laboratory.  This laboratory is certified under the Clinical Laboratory Improvement Amendments CLIA as qualified to perform high complexity clinical laboratory testing.    CEFTRIAXONE  Value in next row Sensitive      4 SENSITIVEThis is a modified FDA-approved test that has been validated and its performance characteristics determined by the reporting  laboratory.  This laboratory is certified under the Clinical Laboratory Improvement Amendments CLIA as qualified to perform high complexity clinical laboratory testing.    CIPROFLOXACIN Value in next row Sensitive      4 SENSITIVEThis is a modified FDA-approved test that has been validated and its performance characteristics determined by the reporting laboratory.  This laboratory is certified under the Clinical Laboratory Improvement Amendments CLIA as qualified to perform high complexity clinical laboratory testing.    GENTAMICIN Value in next row Sensitive      4 SENSITIVEThis is a modified FDA-approved test that has been validated and its performance characteristics determined by the reporting laboratory.  This laboratory is certified under the Clinical Laboratory Improvement Amendments CLIA as qualified to perform high complexity clinical laboratory testing.    NITROFURANTOIN Value in next row Sensitive      4 SENSITIVEThis is a modified FDA-approved test that has been validated and its performance characteristics determined by the reporting laboratory.  This laboratory is certified under the Clinical Laboratory Improvement Amendments CLIA as qualified to perform high complexity clinical laboratory testing.    TRIMETH/SULFA Value in next row Resistant      4 SENSITIVEThis is a modified FDA-approved test that has been  validated and its performance characteristics determined by the reporting laboratory.  This laboratory is certified under the Clinical Laboratory Improvement Amendments CLIA as qualified to perform high complexity clinical laboratory testing.    AMPICILLIN/SULBACTAM Value in next row Intermediate      4 SENSITIVEThis is a modified FDA-approved test that has been validated and its performance characteristics determined by the reporting laboratory.  This laboratory is certified under the Clinical Laboratory Improvement Amendments CLIA as qualified to perform high complexity clinical  laboratory testing.    PIP/TAZO Value in next row Sensitive      <=4 SENSITIVEThis is a modified FDA-approved test that has been validated and its performance characteristics determined by the reporting laboratory.  This laboratory is certified under the Clinical Laboratory Improvement Amendments CLIA as qualified to perform high complexity clinical laboratory testing.    MEROPENEM Value in next row Sensitive      <=4 SENSITIVEThis is a modified FDA-approved test that has been validated and its performance characteristics determined by the reporting laboratory.  This laboratory is certified under the Clinical Laboratory Improvement Amendments CLIA as qualified to perform high complexity clinical laboratory testing.    * >=100,000 COLONIES/mL ESCHERICHIA COLI  Culture, blood (single)     Status: Abnormal   Collection Time: 03/15/24  1:23 PM   Specimen: BLOOD  Result Value Ref Range Status   Specimen Description   Final    BLOOD A-LINE Performed at [facility], [address] [place], [place] [address]    Special Requests   Final    BOTTLES DRAWN AEROBIC AND ANAEROBIC Blood Culture results may not be optimal due to an inadequate volume of blood received in culture bottles Performed at [facility], [address] [place], [place] [address]    Culture  Setup Time   Final    GRAM POSITIVE COCCI IN CLUSTERS ANAEROBIC BOTTLE ONLY CRITICAL RESULT CALLED TO, READ BACK BY AND VERIFIED WITH: PHARMD E JACKSON 03/16/2024 @ 2225 BY AB    Culture (A)  Final    STAPHYLOCOCCUS EPIDERMIDIS THE SIGNIFICANCE OF ISOLATING THIS ORGANISM FROM A SINGLE SET OF BLOOD CULTURES WHEN MULTIPLE SETS ARE DRAWN IS UNCERTAIN. PLEASE NOTIFY THE MICROBIOLOGY DEPARTMENT WITHIN ONE WEEK IF SPECIATION AND SENSITIVITIES ARE REQUIRED. Performed at [facility], [address] [place], [place] [address]    Report Status 03/18/2024 FINAL  Final  Blood Culture ID Panel (Reflexed)      Status: Abnormal   Collection Time: 03/15/24  1:23 PM  Result Value Ref Range Status   Enterococcus faecalis NOT DETECTED NOT DETECTED Final   Enterococcus Faecium NOT DETECTED NOT DETECTED Final   Listeria monocytogenes NOT DETECTED NOT DETECTED Final   Staphylococcus species DETECTED (A) NOT DETECTED Final    Comment: CRITICAL RESULT CALLED TO, READ BACK BY AND VERIFIED WITH: PHARMD E JACKSON 03/16/2024 @ 2225 BY AB    Staphylococcus aureus (BCID) NOT DETECTED NOT DETECTED Final   Staphylococcus epidermidis DETECTED (A) NOT DETECTED Final    Comment: CRITICAL RESULT CALLED TO, READ BACK BY AND VERIFIED WITH: PHARMD E JACKSON 03/16/2024 @ 2225 BY AB    Staphylococcus lugdunensis NOT DETECTED NOT DETECTED Final   Streptococcus species NOT DETECTED NOT DETECTED Final   Streptococcus agalactiae NOT DETECTED NOT DETECTED Final   Streptococcus pneumoniae NOT DETECTED NOT DETECTED Final   Streptococcus pyogenes NOT DETECTED NOT DETECTED Final   A.calcoaceticus-baumannii NOT DETECTED NOT DETECTED Final   Bacteroides fragilis NOT DETECTED NOT  DETECTED Final   Enterobacterales NOT DETECTED NOT DETECTED Final   Enterobacter cloacae complex NOT DETECTED NOT DETECTED Final   Escherichia coli NOT DETECTED NOT DETECTED Final   Klebsiella aerogenes NOT DETECTED NOT DETECTED Final   Klebsiella oxytoca NOT DETECTED NOT DETECTED Final   Klebsiella pneumoniae NOT DETECTED NOT DETECTED Final   Proteus species NOT DETECTED NOT DETECTED Final   Salmonella species NOT DETECTED NOT DETECTED Final   Serratia marcescens NOT DETECTED NOT DETECTED Final   Haemophilus influenzae NOT DETECTED NOT DETECTED Final   Neisseria meningitidis NOT DETECTED NOT DETECTED Final   Pseudomonas aeruginosa NOT DETECTED NOT DETECTED Final   Stenotrophomonas maltophilia NOT DETECTED NOT DETECTED Final   Candida albicans NOT DETECTED NOT DETECTED Final   Candida auris NOT DETECTED NOT DETECTED Final   Candida glabrata NOT  DETECTED NOT DETECTED Final   Candida krusei NOT DETECTED NOT DETECTED Final   Candida parapsilosis NOT DETECTED NOT DETECTED Final   Candida tropicalis NOT DETECTED NOT DETECTED Final   Cryptococcus neoformans/gattii NOT DETECTED NOT DETECTED Final   Methicillin resistance mecA/C NOT DETECTED NOT DETECTED Final    Comment: Performed at [facility], [address] [place], [place] [address]  Blood culture (routine x 2)     Status: None   Collection Time: 03/15/24  7:35 PM   Specimen: BLOOD  Result Value Ref Range Status   Specimen Description   Final    BLOOD BLOOD LEFT ARM Performed at [facility], [address] [place], [place] [address]    Special Requests   Final    Blood Culture adequate volume BOTTLES DRAWN AEROBIC AND ANAEROBIC Performed at [facility], [address] [place], [place] [address]    Culture   Final    NO GROWTH 5 DAYS Performed at [facility], [address] [place], [place] [address]    Report Status 03/20/2024 FINAL  Final  Blood culture (routine x 2)     Status: None   Collection Time: 03/15/24  7:40 PM   Specimen: BLOOD  Result Value Ref Range Status   Specimen Description BLOOD SITE NOT SPECIFIED  Final   Special Requests   Final    BOTTLES DRAWN AEROBIC ONLY Blood Culture adequate volume   Culture   Final    NO GROWTH 5 DAYS Performed at [facility], [address] [place], [place] [address]    Report Status 03/20/2024 FINAL  Final    Labs: CBC: Recent Labs  Lab 03/16/24 0534 03/17/24 0521  WBC 14.5* 7.7  NEUTROABS  --  5.5  HGB 11.1* 11.9*  HCT 34.6* 36.2*  MCV 93.5 95.5  PLT 280 251   Basic Metabolic Panel: Recent Labs  Lab 03/16/24 0534 03/17/24 0521  NA 137 139  K 4.0 3.8  CL 101 105  CO2 24 25  GLUCOSE 93 84  BUN 10 11  CREATININE 0.76 0.65  CALCIUM 8.3* 8.1*  MG  --  1.8   Liver Function Tests: Recent Labs  Lab 03/16/24 0534  AST 29  ALT 12   ALKPHOS 102  BILITOT 0.8  PROT 5.0*  ALBUMIN 2.9*   CBG: Recent Labs  Lab 03/21/24 2117  GLUCAP 87    Discharge time spent: greater than 30 minutes.  Signed: Toribio Door, MD [facility] 03/22/2024

## 2024-03-22 NOTE — TOC Progression Note (Addendum)
 Transition of Care [facility]) - Progression Note    Patient Details  Name: Kevin Page MRN: [id number] Date of Birth: [birth year]  Transition [facility] CM/SW Contact  Doneta Glenys DASEN, RN Phone Number: 03/22/2024, 9:59 AM  Clinical Narrative:    CM called [phone]) on speaker phone with wife present in patient room. Discussed urgency of locating a LTC facility. [name] will call CM back with update later today. 10:45 AM CM spoke with [name] and said [facility] has accepted patient. CM called [facility] [phone], [phone] (m) to confirm. MD updated on discharge plan.  Expected Discharge Plan: Long Term Nursing Home Barriers to Discharge: Continued Medical Work up               Expected Discharge Plan and Services   Discharge Planning Services: CM Consult Post Acute Care Choice: Nursing Home Living arrangements for the past 2 months: Assisted Living Facility (memory care)                 DME Arranged: N/A DME Agency: NA       HH Arranged: NA HH Agency: NA         Social Drivers of Health (SDOH) Interventions SDOH Screenings   Food Insecurity: No Food Insecurity (03/16/2024)  Housing: Low Risk  (03/16/2024)  Transportation Needs: No Transportation Needs (03/16/2024)  Utilities: Not At Risk (03/16/2024)  Tobacco Use: Medium Risk (03/19/2024)    Readmission Risk Interventions    03/16/2024    9:51 AM 10/19/2023    2:57 PM  Readmission Risk Prevention Plan  Post Dischage Appt  Complete  Medication Screening  Complete  Transportation Screening Complete Complete  PCP or Specialist Appt within 5-7 Days Complete   Home Care Screening Complete   Medication Review (RN CM) Complete

## 2024-03-22 NOTE — Progress Notes (Signed)
 [facility] Liaison Note  Received request from Transitions of Care Manager for hospice services at home after discharge. Met with patient's wife [name] to initiate education related to hospice philosophy, services, and team approach to care. [name] verbalized understanding of information given. Per discussion, the plan is for discharge to [facility] by [facility] on 11.5.2025.  Please send signed and completed DNR home with patient.  Please provide prescriptions at discharge as needed to ensure ongoing symptom management.   [facility] information and contact numbers given to [place]. Above information shared with Transitions of Care Manager. Please call with any questions or concerns.  Thank you for the opportunity to participate in this patient's care.   Amy Darien BSN, RN [facility] Liaison [phone]

## 2024-03-22 NOTE — Hospital Course (Addendum)
 89 year old male PMH dementia oriented to self only at baseline, who presented from SNF with altered mental status, with recent history of shaking spell and discharged from ED on antibiotics for UTI.  Reportedly on presentation again to the emergency department there had been an episode of cyanosis prior to admission.  Also was reportedly hypotensive in the field.  Admitted for UTI and sepsis.  Treated with antibiotics with gradual clinical improvement.  Plans were made for long-term care with hospice.  Consultants Palliative care  Procedures/Events None

## 2024-03-28 DIAGNOSIS — I10 Essential (primary) hypertension: Secondary | ICD-10-CM | POA: Diagnosis not present

## 2024-04-17 DEATH — deceased
